# Patient Record
Sex: Female | Born: 1935
Health system: Southern US, Community
[De-identification: ages and names within clinical notes are randomized; demographics above are authoritative.]

## PROBLEM LIST (undated history)

## (undated) DIAGNOSIS — K56609 Unspecified intestinal obstruction, unspecified as to partial versus complete obstruction: Secondary | ICD-10-CM

## (undated) DIAGNOSIS — N6459 Other signs and symptoms in breast: Secondary | ICD-10-CM

## (undated) DIAGNOSIS — M1990 Unspecified osteoarthritis, unspecified site: Secondary | ICD-10-CM

## (undated) DIAGNOSIS — Z9289 Personal history of other medical treatment: Secondary | ICD-10-CM

## (undated) DIAGNOSIS — M8580 Other specified disorders of bone density and structure, unspecified site: Secondary | ICD-10-CM

## (undated) DIAGNOSIS — Z8719 Personal history of other diseases of the digestive system: Secondary | ICD-10-CM

## (undated) DIAGNOSIS — H409 Unspecified glaucoma: Secondary | ICD-10-CM

## (undated) DIAGNOSIS — E119 Type 2 diabetes mellitus without complications: Secondary | ICD-10-CM

## (undated) DIAGNOSIS — H209 Unspecified iridocyclitis: Secondary | ICD-10-CM

## (undated) DIAGNOSIS — D649 Anemia, unspecified: Secondary | ICD-10-CM

## (undated) HISTORY — PX: APPENDECTOMY: SHX54

## (undated) HISTORY — PX: CATARACT EXTRACTION: SUR2

## (undated) HISTORY — DX: Other specified disorders of bone density and structure, unspecified site: M85.80

## (undated) HISTORY — DX: Unspecified osteoarthritis, unspecified site: M19.90

## (undated) HISTORY — DX: Unspecified iridocyclitis: H20.9

## (undated) HISTORY — DX: Other signs and symptoms in breast: N64.59

## (undated) HISTORY — PX: BACK SURGERY: SHX140

## (undated) HISTORY — DX: Unspecified glaucoma: H40.9

## (undated) HISTORY — DX: Personal history of other diseases of the digestive system: Z87.19

## (undated) HISTORY — DX: Unspecified intestinal obstruction, unspecified as to partial versus complete obstruction: K56.609

## (undated) HISTORY — PX: TOTAL ABDOMINAL HYSTERECTOMY: SHX209

## (undated) HISTORY — PX: DILATION AND CURETTAGE OF UTERUS: SHX78

---

## 2001-02-23 ENCOUNTER — Emergency Department (HOSPITAL_COMMUNITY): Admission: EM | Admit: 2001-02-23 | Discharge: 2001-02-23 | Payer: Self-pay | Admitting: *Deleted

## 2001-02-23 ENCOUNTER — Encounter: Payer: Self-pay | Admitting: *Deleted

## 2001-09-24 ENCOUNTER — Ambulatory Visit (HOSPITAL_COMMUNITY): Admission: RE | Admit: 2001-09-24 | Discharge: 2001-09-24 | Payer: Self-pay | Admitting: Specialist

## 2001-09-24 ENCOUNTER — Encounter: Payer: Self-pay | Admitting: Family Medicine

## 2001-10-04 ENCOUNTER — Ambulatory Visit (HOSPITAL_COMMUNITY): Admission: RE | Admit: 2001-10-04 | Discharge: 2001-10-04 | Payer: Self-pay | Admitting: Specialist

## 2001-10-04 ENCOUNTER — Encounter: Payer: Self-pay | Admitting: Family Medicine

## 2002-11-03 DIAGNOSIS — N6459 Other signs and symptoms in breast: Secondary | ICD-10-CM

## 2002-11-03 HISTORY — DX: Other signs and symptoms in breast: N64.59

## 2002-12-23 ENCOUNTER — Other Ambulatory Visit: Admission: RE | Admit: 2002-12-23 | Discharge: 2002-12-23 | Payer: Self-pay | Admitting: Internal Medicine

## 2003-01-04 ENCOUNTER — Encounter: Admission: RE | Admit: 2003-01-04 | Discharge: 2003-01-04 | Payer: Self-pay | Admitting: Internal Medicine

## 2003-01-04 ENCOUNTER — Encounter: Payer: Self-pay | Admitting: Internal Medicine

## 2003-03-18 ENCOUNTER — Encounter: Payer: Self-pay | Admitting: Internal Medicine

## 2003-03-18 ENCOUNTER — Inpatient Hospital Stay (HOSPITAL_COMMUNITY): Admission: EM | Admit: 2003-03-18 | Discharge: 2003-03-22 | Payer: Self-pay

## 2003-03-19 ENCOUNTER — Encounter: Payer: Self-pay | Admitting: Internal Medicine

## 2003-03-20 ENCOUNTER — Encounter (HOSPITAL_BASED_OUTPATIENT_CLINIC_OR_DEPARTMENT_OTHER): Payer: Self-pay | Admitting: General Surgery

## 2003-04-24 LAB — HM COLONOSCOPY: HM Colonoscopy: NORMAL

## 2003-10-28 ENCOUNTER — Emergency Department (HOSPITAL_COMMUNITY): Admission: EM | Admit: 2003-10-28 | Discharge: 2003-10-29 | Payer: Self-pay | Admitting: Emergency Medicine

## 2003-11-04 DIAGNOSIS — H209 Unspecified iridocyclitis: Secondary | ICD-10-CM

## 2003-11-04 HISTORY — DX: Unspecified iridocyclitis: H20.9

## 2004-02-23 ENCOUNTER — Ambulatory Visit (HOSPITAL_COMMUNITY): Admission: RE | Admit: 2004-02-23 | Discharge: 2004-02-23 | Payer: Self-pay | Admitting: Internal Medicine

## 2004-09-27 ENCOUNTER — Ambulatory Visit: Payer: Self-pay | Admitting: Internal Medicine

## 2004-10-18 ENCOUNTER — Ambulatory Visit: Payer: Self-pay | Admitting: Internal Medicine

## 2004-10-29 ENCOUNTER — Encounter: Admission: RE | Admit: 2004-10-29 | Discharge: 2004-10-29 | Payer: Self-pay | Admitting: Internal Medicine

## 2004-11-18 ENCOUNTER — Encounter: Admission: RE | Admit: 2004-11-18 | Discharge: 2005-02-16 | Payer: Self-pay | Admitting: Internal Medicine

## 2004-12-04 ENCOUNTER — Ambulatory Visit: Payer: Self-pay | Admitting: Internal Medicine

## 2005-02-17 ENCOUNTER — Ambulatory Visit: Payer: Self-pay | Admitting: Internal Medicine

## 2005-05-20 ENCOUNTER — Ambulatory Visit: Payer: Self-pay | Admitting: Family Medicine

## 2005-06-16 ENCOUNTER — Ambulatory Visit: Payer: Self-pay | Admitting: Internal Medicine

## 2005-07-21 ENCOUNTER — Ambulatory Visit (HOSPITAL_COMMUNITY): Admission: RE | Admit: 2005-07-21 | Discharge: 2005-07-21 | Payer: Self-pay | Admitting: Family Medicine

## 2005-10-13 ENCOUNTER — Ambulatory Visit: Payer: Self-pay | Admitting: Internal Medicine

## 2006-01-07 ENCOUNTER — Ambulatory Visit: Payer: Self-pay | Admitting: Internal Medicine

## 2006-06-13 ENCOUNTER — Ambulatory Visit: Payer: Self-pay | Admitting: Internal Medicine

## 2006-08-17 ENCOUNTER — Ambulatory Visit: Payer: Self-pay | Admitting: Internal Medicine

## 2006-09-28 ENCOUNTER — Encounter: Admission: RE | Admit: 2006-09-28 | Discharge: 2006-09-28 | Payer: Self-pay | Admitting: Internal Medicine

## 2006-12-07 ENCOUNTER — Ambulatory Visit: Payer: Self-pay | Admitting: Family Medicine

## 2007-01-18 ENCOUNTER — Ambulatory Visit: Payer: Self-pay | Admitting: Internal Medicine

## 2007-02-11 DIAGNOSIS — M8580 Other specified disorders of bone density and structure, unspecified site: Secondary | ICD-10-CM

## 2007-02-11 DIAGNOSIS — K219 Gastro-esophageal reflux disease without esophagitis: Secondary | ICD-10-CM

## 2007-02-11 DIAGNOSIS — M810 Age-related osteoporosis without current pathological fracture: Secondary | ICD-10-CM | POA: Insufficient documentation

## 2007-07-19 ENCOUNTER — Telehealth (INDEPENDENT_AMBULATORY_CARE_PROVIDER_SITE_OTHER): Payer: Self-pay | Admitting: *Deleted

## 2007-07-20 ENCOUNTER — Ambulatory Visit: Payer: Self-pay | Admitting: Internal Medicine

## 2007-07-20 DIAGNOSIS — H209 Unspecified iridocyclitis: Secondary | ICD-10-CM

## 2007-07-21 ENCOUNTER — Ambulatory Visit: Payer: Self-pay | Admitting: Internal Medicine

## 2007-12-01 ENCOUNTER — Ambulatory Visit: Payer: Self-pay | Admitting: Internal Medicine

## 2007-12-01 DIAGNOSIS — E119 Type 2 diabetes mellitus without complications: Secondary | ICD-10-CM

## 2007-12-01 DIAGNOSIS — E1140 Type 2 diabetes mellitus with diabetic neuropathy, unspecified: Secondary | ICD-10-CM | POA: Insufficient documentation

## 2007-12-06 LAB — CONVERTED CEMR LAB
AST: 18 units/L (ref 0–37)
Basophils Relative: 0.5 % (ref 0.0–1.0)
CO2: 31 meq/L (ref 19–32)
Eosinophils Relative: 1.7 % (ref 0.0–5.0)
GFR calc Af Amer: 90 mL/min
Glucose, Bld: 113 mg/dL — ABNORMAL HIGH (ref 70–99)
HCT: 38 % (ref 36.0–46.0)
HDL: 44.9 mg/dL (ref >39.0)
Hemoglobin: 12.1 g/dL (ref 12.0–15.0)
Lymphocytes Relative: 50.1 % — ABNORMAL HIGH (ref 12.0–46.0)
Monocytes Absolute: 0.5 10*3/uL (ref 0.2–0.7)
Neutro Abs: 2.9 10*3/uL (ref 1.4–7.7)
Neutrophils Relative %: 41 % — ABNORMAL LOW (ref 43.0–77.0)
Potassium: 4 meq/L (ref 3.5–5.1)
TSH: 3.09 microintl units/mL (ref 0.35–5.50)
VLDL: 12 mg/dL (ref 0–40)
WBC: 7.1 10*3/uL (ref 4.5–10.5)

## 2008-01-21 ENCOUNTER — Encounter: Admission: RE | Admit: 2008-01-21 | Discharge: 2008-01-21 | Payer: Self-pay | Admitting: Internal Medicine

## 2008-02-07 ENCOUNTER — Encounter (INDEPENDENT_AMBULATORY_CARE_PROVIDER_SITE_OTHER): Payer: Self-pay | Admitting: *Deleted

## 2008-02-12 ENCOUNTER — Encounter: Payer: Self-pay | Admitting: Internal Medicine

## 2008-02-17 ENCOUNTER — Telehealth (INDEPENDENT_AMBULATORY_CARE_PROVIDER_SITE_OTHER): Payer: Self-pay | Admitting: *Deleted

## 2008-02-22 ENCOUNTER — Ambulatory Visit: Payer: Self-pay | Admitting: Internal Medicine

## 2008-03-18 ENCOUNTER — Emergency Department (HOSPITAL_COMMUNITY): Admission: EM | Admit: 2008-03-18 | Discharge: 2008-03-19 | Payer: Self-pay | Admitting: Emergency Medicine

## 2008-05-11 ENCOUNTER — Inpatient Hospital Stay (HOSPITAL_COMMUNITY): Admission: EM | Admit: 2008-05-11 | Discharge: 2008-05-17 | Payer: Self-pay | Admitting: Emergency Medicine

## 2008-05-11 ENCOUNTER — Ambulatory Visit: Payer: Self-pay | Admitting: Internal Medicine

## 2008-05-26 ENCOUNTER — Ambulatory Visit: Payer: Self-pay | Admitting: Internal Medicine

## 2008-05-26 ENCOUNTER — Ambulatory Visit (HOSPITAL_COMMUNITY): Admission: RE | Admit: 2008-05-26 | Discharge: 2008-05-26 | Payer: Self-pay | Admitting: Internal Medicine

## 2008-05-29 ENCOUNTER — Telehealth (INDEPENDENT_AMBULATORY_CARE_PROVIDER_SITE_OTHER): Payer: Self-pay | Admitting: *Deleted

## 2008-06-02 ENCOUNTER — Ambulatory Visit: Payer: Self-pay | Admitting: Internal Medicine

## 2008-06-13 LAB — CONVERTED CEMR LAB
Hgb A1c MFr Bld: 7 % — ABNORMAL HIGH (ref 4.6–6.1)
Microalb, Ur: 0.77 mg/dL (ref 0.00–1.89)

## 2008-06-28 ENCOUNTER — Ambulatory Visit: Payer: Self-pay | Admitting: Internal Medicine

## 2008-06-29 ENCOUNTER — Encounter: Payer: Self-pay | Admitting: Internal Medicine

## 2008-07-11 ENCOUNTER — Encounter: Payer: Self-pay | Admitting: Internal Medicine

## 2008-08-07 ENCOUNTER — Ambulatory Visit: Payer: Self-pay | Admitting: Internal Medicine

## 2008-12-21 ENCOUNTER — Ambulatory Visit: Payer: Self-pay | Admitting: Internal Medicine

## 2008-12-21 DIAGNOSIS — M1990 Unspecified osteoarthritis, unspecified site: Secondary | ICD-10-CM | POA: Insufficient documentation

## 2009-01-03 ENCOUNTER — Ambulatory Visit: Payer: Self-pay | Admitting: Internal Medicine

## 2009-01-03 DIAGNOSIS — R0989 Other specified symptoms and signs involving the circulatory and respiratory systems: Secondary | ICD-10-CM

## 2009-01-03 LAB — CONVERTED CEMR LAB: Vit D, 25-Hydroxy: 27 ng/mL — ABNORMAL LOW (ref 30–89)

## 2009-01-05 ENCOUNTER — Telehealth (INDEPENDENT_AMBULATORY_CARE_PROVIDER_SITE_OTHER): Payer: Self-pay | Admitting: *Deleted

## 2009-01-05 LAB — CONVERTED CEMR LAB
BUN: 18 mg/dL (ref 6–23)
CO2: 30 meq/L (ref 19–32)
Calcium: 9.2 mg/dL (ref 8.4–10.5)
Cholesterol: 197 mg/dL (ref 0–200)
Creatinine, Ser: 0.8 mg/dL (ref 0.4–1.2)
Glucose, Bld: 112 mg/dL — ABNORMAL HIGH (ref 70–99)
HDL: 43.7 mg/dL (ref >39.0)
Hemoglobin: 12.1 g/dL (ref 12.0–15.0)
Microalb, Ur: 0.4 mg/dL (ref 0.0–1.9)
Sodium: 143 meq/L (ref 135–145)
Triglycerides: 60 mg/dL (ref 0–149)
VLDL: 12 mg/dL (ref 0–40)

## 2009-01-12 ENCOUNTER — Ambulatory Visit: Payer: Self-pay

## 2009-01-12 ENCOUNTER — Encounter: Payer: Self-pay | Admitting: Internal Medicine

## 2009-01-16 ENCOUNTER — Emergency Department (HOSPITAL_COMMUNITY): Admission: EM | Admit: 2009-01-16 | Discharge: 2009-01-16 | Payer: Self-pay | Admitting: Emergency Medicine

## 2009-01-19 ENCOUNTER — Ambulatory Visit: Payer: Self-pay | Admitting: Internal Medicine

## 2009-01-19 LAB — CONVERTED CEMR LAB
Bacteria, UA: NONE SEEN
Glucose, Urine, Semiquant: NEGATIVE
Ketones, urine, test strip: NEGATIVE
Nitrite: NEGATIVE
WBC Urine, dipstick: NEGATIVE
WBC, UA: NONE SEEN cells/hpf (ref <3)
pH: 6

## 2009-01-20 ENCOUNTER — Encounter: Payer: Self-pay | Admitting: Internal Medicine

## 2009-01-22 ENCOUNTER — Telehealth: Payer: Self-pay | Admitting: Internal Medicine

## 2009-01-23 ENCOUNTER — Encounter: Admission: RE | Admit: 2009-01-23 | Discharge: 2009-01-23 | Payer: Self-pay | Admitting: Internal Medicine

## 2009-02-20 ENCOUNTER — Telehealth (INDEPENDENT_AMBULATORY_CARE_PROVIDER_SITE_OTHER): Payer: Self-pay | Admitting: *Deleted

## 2009-02-23 ENCOUNTER — Encounter (INDEPENDENT_AMBULATORY_CARE_PROVIDER_SITE_OTHER): Payer: Self-pay | Admitting: *Deleted

## 2009-04-09 ENCOUNTER — Ambulatory Visit: Payer: Self-pay | Admitting: Internal Medicine

## 2009-04-09 DIAGNOSIS — E785 Hyperlipidemia, unspecified: Secondary | ICD-10-CM

## 2009-05-10 ENCOUNTER — Telehealth (INDEPENDENT_AMBULATORY_CARE_PROVIDER_SITE_OTHER): Payer: Self-pay | Admitting: *Deleted

## 2009-08-09 ENCOUNTER — Encounter: Payer: Self-pay | Admitting: Internal Medicine

## 2009-08-13 ENCOUNTER — Ambulatory Visit: Payer: Self-pay | Admitting: Internal Medicine

## 2009-08-14 ENCOUNTER — Encounter: Payer: Self-pay | Admitting: Internal Medicine

## 2009-08-15 ENCOUNTER — Telehealth: Payer: Self-pay | Admitting: Internal Medicine

## 2009-08-16 ENCOUNTER — Encounter: Payer: Self-pay | Admitting: Internal Medicine

## 2009-08-24 ENCOUNTER — Encounter: Admission: RE | Admit: 2009-08-24 | Discharge: 2009-08-24 | Payer: Self-pay | Admitting: Orthopedic Surgery

## 2009-12-18 ENCOUNTER — Telehealth: Payer: Self-pay | Admitting: Internal Medicine

## 2010-01-21 ENCOUNTER — Ambulatory Visit: Payer: Self-pay | Admitting: Internal Medicine

## 2010-01-21 DIAGNOSIS — R109 Unspecified abdominal pain: Secondary | ICD-10-CM

## 2010-01-24 LAB — CONVERTED CEMR LAB
Basophils Relative: 2.6 % (ref 0.0–3.0)
CO2: 29 meq/L (ref 19–32)
Calcium: 9.2 mg/dL (ref 8.4–10.5)
Creatinine, Ser: 0.7 mg/dL (ref 0.4–1.2)
Eosinophils Absolute: 0.1 10*3/uL (ref 0.0–0.7)
Glucose, Bld: 88 mg/dL (ref 70–99)
Hemoglobin: 12.1 g/dL (ref 12.0–15.0)
Lymphs Abs: 3.7 10*3/uL (ref 0.7–4.0)
MCHC: 31.4 g/dL (ref 30.0–36.0)
MCV: 85.8 fL (ref 78.0–100.0)
Monocytes Absolute: 0.6 10*3/uL (ref 0.1–1.0)
Neutro Abs: 1.3 10*3/uL — ABNORMAL LOW (ref 1.4–7.7)
RBC: 4.5 M/uL (ref 3.87–5.11)

## 2010-04-25 ENCOUNTER — Ambulatory Visit: Payer: Self-pay | Admitting: Internal Medicine

## 2010-04-30 ENCOUNTER — Encounter: Payer: Self-pay | Admitting: Internal Medicine

## 2010-04-30 LAB — CONVERTED CEMR LAB
AST: 18 units/L (ref 0–37)
Hgb A1c MFr Bld: 6.7 % — ABNORMAL HIGH (ref 4.6–6.5)
TSH: 2.03 microintl units/mL (ref 0.35–5.50)
Total CHOL/HDL Ratio: 4

## 2010-05-02 ENCOUNTER — Encounter: Payer: Self-pay | Admitting: Internal Medicine

## 2010-05-02 ENCOUNTER — Encounter: Admission: RE | Admit: 2010-05-02 | Discharge: 2010-05-02 | Payer: Self-pay | Admitting: Internal Medicine

## 2010-05-13 ENCOUNTER — Encounter: Payer: Self-pay | Admitting: Internal Medicine

## 2010-05-14 ENCOUNTER — Encounter: Admission: RE | Admit: 2010-05-14 | Discharge: 2010-05-14 | Payer: Self-pay | Admitting: Internal Medicine

## 2010-05-17 ENCOUNTER — Ambulatory Visit: Payer: Self-pay | Admitting: Internal Medicine

## 2010-05-17 DIAGNOSIS — L821 Other seborrheic keratosis: Secondary | ICD-10-CM

## 2010-05-22 ENCOUNTER — Ambulatory Visit: Payer: Self-pay | Admitting: Internal Medicine

## 2010-05-24 ENCOUNTER — Ambulatory Visit: Payer: Self-pay | Admitting: Family Medicine

## 2010-06-05 ENCOUNTER — Ambulatory Visit: Payer: Self-pay | Admitting: Internal Medicine

## 2010-06-05 ENCOUNTER — Telehealth: Payer: Self-pay | Admitting: Internal Medicine

## 2010-06-12 LAB — CONVERTED CEMR LAB
ALT: 18 units/L (ref 0–35)
HDL: 44.9 mg/dL (ref >39.00)
Total CHOL/HDL Ratio: 3

## 2010-06-18 ENCOUNTER — Encounter (INDEPENDENT_AMBULATORY_CARE_PROVIDER_SITE_OTHER): Payer: Self-pay | Admitting: *Deleted

## 2010-07-22 ENCOUNTER — Telehealth (INDEPENDENT_AMBULATORY_CARE_PROVIDER_SITE_OTHER): Payer: Self-pay | Admitting: *Deleted

## 2010-08-14 ENCOUNTER — Encounter: Payer: Self-pay | Admitting: Internal Medicine

## 2010-08-26 ENCOUNTER — Encounter: Payer: Self-pay | Admitting: Internal Medicine

## 2010-08-26 ENCOUNTER — Ambulatory Visit: Payer: Self-pay | Admitting: Internal Medicine

## 2010-08-26 DIAGNOSIS — H9319 Tinnitus, unspecified ear: Secondary | ICD-10-CM

## 2010-08-26 LAB — CONVERTED CEMR LAB
Basophils Absolute: 0 10*3/uL (ref 0.0–0.1)
Calcium: 9.1 mg/dL (ref 8.4–10.5)
GFR calc non Af Amer: 102.39 mL/min (ref >60)
Glucose, Bld: 88 mg/dL (ref 70–99)
Hemoglobin: 11.6 g/dL — ABNORMAL LOW (ref 12.0–15.0)
Hgb A1c MFr Bld: 6.7 % — ABNORMAL HIGH (ref 4.6–6.5)
Lymphocytes Relative: 49.3 % — ABNORMAL HIGH (ref 12.0–46.0)
Monocytes Relative: 6.5 % (ref 3.0–12.0)
Neutro Abs: 2.9 10*3/uL (ref 1.4–7.7)
Neutrophils Relative %: 42.1 % — ABNORMAL LOW (ref 43.0–77.0)
RDW: 13.8 % (ref 11.5–14.6)
Sodium: 142 meq/L (ref 135–145)

## 2010-08-31 ENCOUNTER — Telehealth (INDEPENDENT_AMBULATORY_CARE_PROVIDER_SITE_OTHER): Payer: Self-pay | Admitting: *Deleted

## 2010-09-03 ENCOUNTER — Encounter (INDEPENDENT_AMBULATORY_CARE_PROVIDER_SITE_OTHER): Payer: Self-pay | Admitting: *Deleted

## 2010-09-12 ENCOUNTER — Ambulatory Visit: Payer: Self-pay | Admitting: Cardiology

## 2010-09-12 ENCOUNTER — Ambulatory Visit: Payer: Self-pay

## 2010-09-12 ENCOUNTER — Ambulatory Visit (HOSPITAL_COMMUNITY): Admission: RE | Admit: 2010-09-12 | Discharge: 2010-09-12 | Payer: Self-pay | Admitting: Internal Medicine

## 2010-09-12 ENCOUNTER — Encounter: Payer: Self-pay | Admitting: Internal Medicine

## 2010-09-13 ENCOUNTER — Ambulatory Visit: Payer: Self-pay | Admitting: Internal Medicine

## 2010-09-18 ENCOUNTER — Encounter: Payer: Self-pay | Admitting: Internal Medicine

## 2010-09-18 ENCOUNTER — Telehealth: Payer: Self-pay | Admitting: Internal Medicine

## 2010-09-18 LAB — CONVERTED CEMR LAB
Ferritin: 64.5 ng/mL (ref 10.0–291.0)
Folate: 8.6 ng/mL
Iron: 64 ug/dL (ref 42–145)
Vitamin B-12: 519 pg/mL (ref 211–911)

## 2010-10-03 HISTORY — PX: TOTAL KNEE ARTHROPLASTY: SHX125

## 2010-10-11 ENCOUNTER — Inpatient Hospital Stay (HOSPITAL_COMMUNITY)
Admission: RE | Admit: 2010-10-11 | Discharge: 2010-10-14 | Payer: Self-pay | Source: Home / Self Care | Attending: Orthopedic Surgery | Admitting: Orthopedic Surgery

## 2010-10-12 LAB — CONVERTED CEMR LAB: Potassium, serum: 3.9 mmol/L

## 2010-10-14 LAB — CONVERTED CEMR LAB
Hemoglobin: 9.2 g/dL
Platelets: 128 10*3/uL

## 2010-11-13 ENCOUNTER — Ambulatory Visit
Admission: RE | Admit: 2010-11-13 | Discharge: 2010-11-13 | Payer: Self-pay | Source: Home / Self Care | Attending: Internal Medicine | Admitting: Internal Medicine

## 2010-11-13 DIAGNOSIS — D649 Anemia, unspecified: Secondary | ICD-10-CM | POA: Insufficient documentation

## 2010-11-13 LAB — CONVERTED CEMR LAB: Hemoglobin: 9.6 g/dL

## 2010-12-01 LAB — CONVERTED CEMR LAB
Bilirubin Urine: NEGATIVE
CO2: 29 meq/L (ref 19–32)
Calcium: 9.4 mg/dL (ref 8.4–10.5)
Chloride: 102 meq/L (ref 96–112)
Glucose, Bld: 154 mg/dL — ABNORMAL HIGH (ref 70–99)
Glucose, Urine, Semiquant: NEGATIVE
Hemoglobin: 12.3 g/dL (ref 12.0–15.0)
Lymphocytes Relative: 43.8 % (ref 12.0–46.0)
Monocytes Relative: 7.4 % (ref 3.0–12.0)
Neutro Abs: 3.2 10*3/uL (ref 1.4–7.7)
Neutrophils Relative %: 46.4 % (ref 43.0–77.0)
RBC: 4.53 M/uL (ref 3.87–5.11)
RDW: 13.2 % (ref 11.5–14.6)
Sodium: 139 meq/L (ref 135–145)
Specific Gravity, Urine: 1.015
Urobilinogen, UA: 0.2
WBC Urine, dipstick: NEGATIVE
pH: 5

## 2010-12-03 NOTE — Progress Notes (Signed)
Summary: HYDROCODONE REFILL  Phone Note Refill Request Message from:  Patient on July 22, 2010 10:00 AM  Refills Requested: Medication #1:  VICODIN 5-500 MG TABS one every 4 hours as needed for pain.Marland Kitchen PATIENT NUMBER = [phone]     PLEASE CALL WHEN READY FOR PICKUP  Initial call taken by: Jerolyn Shin,  July 22, 2010 10:01 AM  Follow-up for Phone Call        Please advise.  Follow-up by: Lucious Groves CMA,  July 22, 2010 10:05 AM  Additional Follow-up for Phone Call Additional follow up Details #1::        ok  #40 , no refills Additional Follow-up by: [facility] E. Paz MD,  July 22, 2010 3:53 PM    Additional Follow-up for Phone Call Additional follow up Details #2::    Pt is aware rx is ready for pick up. Army Fossa CMA  July 22, 2010 4:21 PM   Prescriptions: VICODIN 5-500 MG TABS (HYDROCODONE-ACETAMINOPHEN) one every 4 hours as needed for pain.  #40 x 0   Entered by:   Lucious Groves CMA   Authorized by:   Nolon Rod. Paz MD   Signed by:   Lucious Groves CMA on 07/22/2010   Method used:   Print then Give to Patient   RxID:   [id number]

## 2010-12-03 NOTE — Letter (Signed)
Summary: Unable To Reach-Consult Scheduled  [facility] [place]  [address] [place], [place] [address]   Phone: [phone]  Fax: [phone]    06/18/2010 MRN: [id number]    Dear Ms. Minteer,   We have been unable to reach you by phone.  Please contact our office with an updated phone number.     Thank you,  Army Fossa CMA  June 18, 2010 8:58 AM

## 2010-12-03 NOTE — Assessment & Plan Note (Signed)
Summary: clearance for surgery//kn   Vital Signs:  Patient profile:   77 year old female Height:      65 inches (165.10 cm) Weight:      205.50 pounds (93.41 kg) BMI:     34.32 O2 Sat:      97 % on Room air Temp:     98.3 degrees F (36.83 degrees C) oral Pulse rate:   86 / minute BP sitting:   124 / 66  (left arm) Cuff size:   regular  Vitals Entered By: Lucious Groves CMA (August 26, 2010 12:23 PM)  O2 Flow:  Room air CC: Discuss surgical clearance./kb Is Patient Diabetic? Yes Comments Patient notes that she was not given her pain med by our office so she had to get an alternate from Ortho. Patient also notes that she has had ringing in the Left ear x4 months./kb   History of Present Illness:  to have a right total knee replacement soon with Dr. Madelon Lips Needs surgical clearance Doing well She was prescribed Vicodin here, that helps better than Ultram prescribed by orthopedic surgery.  Still has left-sided tinnitus for several months, no decreased hearing.   Current Medications (verified): 1)  Metformin Hcl 500 Mg Tabs (Metformin Hcl) .... Tak One Tablet Two Times A Day 2)  Aspirin 81 Mg Tbec (Aspirin) .... One Tablet Daily 3)  Simvastatin 20 Mg Tabs (Simvastatin) .Marland Kitchen.. 1 By Mouth At Bedtime. 4)  Centrum Silver Ultra Womens  Tabs (Multiple Vitamins-Minerals) .... Take 1 Tablet By Mouth Once A Day 5)  Vitamin B-12 50 Mcg Tabs (Cyanocobalamin) .... Take 1 Tablet By Mouth Once A Day 6)  Calcium-Magnesium-Vitamin D 200-100-33.3 Mg-Mg-Unit Caps (Calcium-Magnesium-Vitamin D) .... Take 1 Capsule By Mouth Once A Day 7)  Vicodin 5-500 Mg Tabs (Hydrocodone-Acetaminophen) .... One Every 4 Hours As Needed For Pain. 8)  **pain Med--Name Unknown .... As Directed As Needed  Allergies (verified): No Known Drug Allergies  Past History:  Past Medical History: Reviewed history from 04/25/2010 and no changes required. AODM GERD Osteopenia abnormal breast exam 2004 - [facility] NEG (eval by  surgeon neg) h/o SBO 2004, and 7-09 h/o iritis aprox 2005 Osteoarthritis Hyperlipidemia normal carotid ultrasound 01/2009  Past Surgical History: Reviewed history from 01/03/2009 and no changes required. Hysterectomy in the 70s, for bleeding, no cancer  Oophorectomy Bilat per pt  Social History: Reviewed history from 04/25/2010 and no changes required. lives by herself, her family is in [place], lost   2 sisters  widow 2 kids (they live in [place]) has her own bussiness, makes aprons tobacco-- never  ETOH--never   Review of Systems CV:  denies chest pain or shortness of breath no orthopnea, no palpitations She is very active, able to walk 2 blocks and then stop  due to fatigue. GI:  no nausea, vomiting, diarrhea.  Physical Exam  General:  alert and well-developed.   Neck:  no JVD at 45 Lungs:  normal respiratory effort, no intercostal retractions, no accessory muscle use, and normal breath sounds.   Heart:  normal rate, regular rhythm, no murmur, and no gallop.   Abdomen:  soft, non-tender, no distention, no masses, no guarding, and no rigidity.   Extremities:  no lower extremity edema Psych:  not anxious appearing and not depressed appearing.     Impression & Recommendations:  Problem # 1:  OSTEOARTHRITIS (ICD-25.62) 77 year old lady with history of diabetes,high cholesterol, nonsmoker who is in need of a total knee replacement She is essentially asymptomatic EKG today  showed NSR  Chart reviewed, normal carotid ultrasound in 2010  she needs surgical clearance, case informally discussed with cardiology. Will order a echocardiogram, if LV function normal then she is clear otherwise she may needs a stress test  Her updated medication list for this problem includes:    Aspirin 81 Mg Tbec (Aspirin) ..... One tablet daily    Vicodin 5-500 Mg Tabs (Hydrocodone-acetaminophen) ..... One every 4 hours as needed for pain.  Orders: Venipuncture (62694) TLB-CBC Platelet -  w/Differential (85025-CBCD) EKG w/ Interpretation (93000) Specimen Handling (85462) Cardiology Referral (Cardiology)  Problem # 2:  AODM (ICD-250.00) due for labs Her updated medication list for this problem includes:    Metformin Hcl 500 Mg Tabs (Metformin hcl) .Marland Kitchen... Tak one tablet two times a day    Aspirin 81 Mg Tbec (Aspirin) ..... One tablet daily    Labs Reviewed: Creat: 0.7 (01/21/2010)     Last Eye Exam: normal (08/14/2009) Reviewed HgBA1c results: 6.7 (04/25/2010)  7.2 (01/21/2010)  Orders: TLB-A1C / Hgb A1C (Glycohemoglobin) (83036-A1C) TLB-BMP (Basic Metabolic Panel-BMET) (80048-METABOL)  Problem # 3:  TINNITUS (ICD-388.30) reassess after surgery  Problem # 4:  time spent coordination the patient care more than 35 minutes  Complete Medication List: 1)  Metformin Hcl 500 Mg Tabs (Metformin hcl) .... Tak one tablet two times a day 2)  Aspirin 81 Mg Tbec (Aspirin) .... One tablet daily 3)  Simvastatin 20 Mg Tabs (Simvastatin) .Marland Kitchen.. 1 by mouth at bedtime. 4)  Centrum Silver Ultra Womens Tabs (Multiple vitamins-minerals) .... Take 1 tablet by mouth once a day 5)  Vitamin B-12 50 Mcg Tabs (Cyanocobalamin) .... Take 1 tablet by mouth once a day 6)  Calcium-magnesium-vitamin D 200-100-33.3 Mg-mg-unit Caps (Calcium-magnesium-vitamin d) .... Take 1 capsule by mouth once a day 7)  Vicodin 5-500 Mg Tabs (Hydrocodone-acetaminophen) .... One every 4 hours as needed for pain. 8)  **pain Med--name Unknown  .... As directed as needed  Other Orders: Flu Vaccine 60yrs + MEDICARE PATIENTS (V0350) Administration Flu vaccine - MCR (K9381)  Patient Instructions: 1)  I'll send a letter to your orthopedic surgeon Prescriptions: SIMVASTATIN 20 MG TABS (SIMVASTATIN) 1 by mouth at bedtime.  #90 x 3   Entered and Authorized by:   Nolon Rod. Paz MD   Signed by:   Nolon Rod. Paz MD on 08/26/2010   Method used:   Print then Give to Patient   RxID:   [id number] METFORMIN HCL 500 MG TABS  (METFORMIN HCL) tak one tablet two times a day  #180 x 3   Entered and Authorized by:   Nolon Rod. Paz MD   Signed by:   Nolon Rod. Paz MD on 08/26/2010   Method used:   Print then Give to Patient   RxID:   [id number] VICODIN 5-500 MG TABS (HYDROCODONE-ACETAMINOPHEN) one every 4 hours as needed for pain.  #60 x 0   Entered and Authorized by:   Nolon Rod. Paz MD   Signed by:   Nolon Rod. Paz MD on 08/26/2010   Method used:   Print then Give to Patient   RxID:   [id number]    Orders Added: 1)  Venipuncture [36415] 2)  TLB-A1C / Hgb A1C (Glycohemoglobin) [83036-A1C] 3)  TLB-BMP (Basic Metabolic Panel-BMET) [80048-METABOL] 4)  TLB-CBC Platelet - w/Differential [85025-CBCD] 5)  EKG w/ Interpretation [93000] 6)  Specimen Handling [99000] 7)  Flu Vaccine 26yrs + MEDICARE PATIENTS [Q2039] 8)  Administration Flu vaccine - MCR [G0008] 9)  Cardiology Referral [Cardiology]  10)  Est. Patient Level IV [[address]] Flu Vaccine Consent Questions     Do you have a history of severe allergic reactions to this vaccine? no    Any prior history of allergic reactions to egg and/or gelatin? no    Do you have a sensitivity to the preservative Thimersol? no    Do you have a past history of Guillan-Barre Syndrome? no    Do you currently have an acute febrile illness? no    Have you ever had a severe reaction to latex? no    Vaccine information given and explained to patient? yes    Are you currently pregnant? no    Lot Number:[id number]   Exp Date:05/03/2011   Site Given  leftt Deltoid IMcine 20yrs + MEDICARE PATIENTS [Q2039] 8)  Administration Flu vaccine - MCR [G0008]     .lbmedflu

## 2010-12-03 NOTE — Assessment & Plan Note (Signed)
Summary: remove mole/cbs--Rm 11   Vital Signs:  Patient profile:   77 year old female Height:      65 inches Weight:      207.25 pounds BMI:     34.61 Temp:     97.2 degrees F oral Pulse rate:   72 / minute Pulse rhythm:   regular Resp:     16 per minute BP sitting:   130 / 70  (left arm) Cuff size:   large  Vitals Entered By: Mervin Kung CMA Duncan Dull) (May 17, 2010 12:44 PM) CC: Room 11   Pt here for mole removal. Is Patient Diabetic? No Comments Pt has completed Amoxicillin.  Nicki Gu[place]ara Fergerson CMA Duncan Dull)  May 17, 2010 12:47 PM    History of Present Illness: has a lesion in the right lower quadrant of the abdomen, is causing some discomfort, has increased in size lately  Allergies (verified): No Known Drug Allergies   Impression & Recommendations:  Problem # 1:  procedure note in a sterile fashion and under anesthesia with 2 cc of 1% lidocaine without we did an elliptical incision about 1.3 cm in size and removed a aprox 1cm skin lesion, dark, hard. Likely seborrheic keratosis 3 stitches Pathology sent Patient will call if redness, discharge We'll keep the area clean and dry and put an antibiotic ointment on it Come back in 5 days to check the sutures  Complete Medication List: 1)  Metformin Hcl 500 Mg Tabs (Metformin hcl) .... Tak one tablet two times a day 2)  Amoxicillin 500 Mg Caps (Amoxicillin) .... 2 by mouth twice a day for one week 3)  Aspirin 81 Mg Tbec (Aspirin) .... One tablet daily 4)  Simvastatin 20 Mg Tabs (Simvastatin) .Marland Kitchen.. 1 by mouth at bedtime. 5)  Centrum Silver Ultra Womens Tabs (Multiple vitamins-minerals) .... Take 1 tablet by mouth once a day 6)  Vitamin B-12 50 Mcg Tabs (Cyanocobalamin) .... Take 1 tablet by mouth once a day 7)  Calcium-magnesium-vitamin D 200-100-33.3 Mg-mg-unit Caps (Calcium-magnesium-vitamin d) .... Take 1 capsule by mouth once a day  Other Orders: Excise lesion (TAL) 0.6 - 1.0 (11401)  Current Allergies (reviewed  today): No known allergies

## 2010-12-03 NOTE — Assessment & Plan Note (Signed)
Summary: STOMACH PAIN/[facility]   Vital Signs:  Patient profile:   77 year old female Height:      65.5 inches Weight:      210 pounds BMI:     34.54 Pulse rate:   70 / minute BP sitting:   120 / 70  Vitals Entered By: Shary Decamp (January 21, 2010 2:47 PM) CC: rov - not on any meds, unable to tolerate nexium - stools black   History of Present Illness: chief complaint today is abdominal pain she has a long history of low , bilateral, gas-like abdominal pain usually these episodes are f/u by nausea,  vomiting and diarrhea denies blood in the stools but nexium "make the stools dark" she had hematemesis in the past and was admitted to hospital she went to the ER in [place] in January with the same symptoms, she was prescribed hydrocodone and zofran.  No records available    Current Medications (verified): 1)  None  Allergies (verified): No Known Drug Allergies  Past History:  Past Medical History: AODM GERD Osteopenia abnormal breast exam 2004 - [facility] NEG (eval by surgeon neg) h/o SBO 2004, and 7-09 h/o iritis aprox 2005 Osteoarthritis Hyperlipidemia normal carotid ultrasound 01/2009  Past Surgical History: Reviewed history from 01/03/2009 and no changes required. Hysterectomy in the 70s, for bleeding, no cancer  Oophorectomy Bilat per pt  Social History: Reviewed history from 12/01/2007 and no changes required. lives by herself, her family is in [place] widow 2 kids (they live in [place]) has her own bussiness, makes aprons  Review of Systems       denies chest pain or shortness of breath today no edema CBGs no more than 100 sitting when checked denies dysuria or hematuria she continued with severe OA pain, she is getting local knee injections, thinking about a total knee replacement for December 2011 GI:  no dysphagia .  Physical Exam  General:  alert, well-developed, and well-nourished.   Lungs:  normal respiratory effort, no intercostal retractions, no  accessory muscle use, and normal breath sounds.    Heart:  normal rate, regular rhythm, no murmur, and no gallop.   Abdomen:  soft, non-tender, no distention, no masses, no guarding, no rigidity, and no rebound tenderness.    Extremities:  no edema,  calves symmetric and nontender   Impression & Recommendations:  Problem # 1:  ABDOMINAL PAIN, RECURRENT (ICD-789.00)  h/o abdominal pain as described above history of SBO twice, last time 2009, was admitted to the hospital, self resolved chart is reviewed, she had a CT of the abdomen and pelvis  March 2010: no acute, + diverticuli no recent ultrasound that I can tell last colonoscopy 2004 Plan: GI referral--DECLINED D/T COST, LIKES TO BE REASSES IN THE SUMMER   Orders: TLB-CBC Platelet - w/Differential (85025-CBCD)  Problem # 2:  OSTEOARTHRITIS (ICD-715.90) cannot tolerate meloxicam  she continued with severe OA pain, she is getting local knee injections, thinking about a total knee replacement for December 2011  The following medications were removed from the medication list:    Vicodin 5-500 Mg Tabs (Hydrocodone-acetaminophen) .Marland Kitchen... 1 or 2 every 6 hours as needed pain watch for somnolence    Meloxicam 7.5 Mg Tabs (Meloxicam) .Marland Kitchen... Per ortho    Ultram 50 Mg Tabs (Tramadol hcl) .Marland Kitchen... Per ortho  Problem # 3:  AODM (ICD-250.00) on diet only, labs Labs Reviewed: Creat: 0.8 (01/03/2009)     Last Eye Exam: normal (08/14/2009) Reviewed HgBA1c results: 7.0 (01/03/2009)  7.0 (06/02/2008)  Orders: Venipuncture ([address]) TLB-BMP (Basic Metabolic Panel-BMET) (80048-METABOL) TLB-A1C / Hgb A1C (Glycohemoglobin) (83036-A1C) TLB-Microalbumin/Creat Ratio, Urine (82043-MALB)  Problem # 4:  time spent more than 25 minutes d/t/ chart review in reference  to recurrent abd pain   Patient Instructions: 1)  Please schedule a follow-up appointment in 3 months (fasting, yearly checkup)

## 2010-12-03 NOTE — Progress Notes (Signed)
Summary: needs more labs, ordering an echocardiogramlab appt scheculed   Phone Note Outgoing Call   Summary of Call: advise patient: diabetes well controlled Hg slightly  lower than before  I will order a echocardiogram and further labs to r/o iron deficiency  before she is cleared  please ask the patient to come back for a iron, ferritin, B12 and folic acid DX anemia Jose E. Paz MD  August 31, 2010 10:04 AM     Follow-up for Phone Call        left message to call back.[name]  September 02, 2010 11:39 AM  Additional Follow-up for Phone Call Additional follow up Details #1::        left message for pt to call back. [name] CMA  September 03, 2010 8:53 AM     Additional Follow-up for Phone Call Additional follow up Details #2::    Pt is aware. [name] CMA  September 03, 2010 9:41 AM  patient is scheduled for lab [phone] .Marland KitchenOkey Regal Taylor  September 03, 2010 10:27 AM

## 2010-12-03 NOTE — Assessment & Plan Note (Signed)
Summary: 5 DAY ROA///[facility]   Vital Signs:  Patient profile:   77 year old female Weight:      207.38 pounds Pulse rate:   92 / minute Pulse rhythm:   regular BP sitting:   130 / 76  (left arm) Cuff size:   large  Vitals Entered By: Army Fossa CMA (May 22, 2010 2:21 PM) CC: 5 day follow up. Comments Check Incision spot. Having ringing her ears. Refill on Amoxicillin.   History of Present Illness:  -suture removal, no redness or d/c  -Refill on DM  medicines  -having knee surgery in November   Current Medications (verified): 1)  Metformin Hcl 500 Mg Tabs (Metformin Hcl) .... Tak One Tablet Two Times A Day 2)  Amoxicillin 500 Mg Caps (Amoxicillin) .... 2 By Mouth Twice A Day For One Week 3)  Aspirin 81 Mg Tbec (Aspirin) .... One Tablet Daily 4)  Simvastatin 20 Mg Tabs (Simvastatin) .Marland Kitchen.. 1 By Mouth At Bedtime. 5)  Centrum Silver Ultra Womens  Tabs (Multiple Vitamins-Minerals) .... Take 1 Tablet By Mouth Once A Day 6)  Vitamin B-12 50 Mcg Tabs (Cyanocobalamin) .... Take 1 Tablet By Mouth Once A Day 7)  Calcium-Magnesium-Vitamin D 200-100-33.3 Mg-Mg-Unit Caps (Calcium-Magnesium-Vitamin D) .... Take 1 Capsule By Mouth Once A Day  Allergies (verified): No Known Drug Allergies  Physical Exam  General:  alert and well-developed.   Skin:  wound in the lower abdomen without redness or discharge. No completely healed   Impression & Recommendations:  Problem # 1:  SEBORRHEIC KERATOSIS (ICD-702.19)  status post excision, patient aware pathology was benign Advised patient to come back in 2 days for reassessment of the wound, likely the sutures will be ready to come out then  Orders: No Charge Patient Arrived (NCPA0) (NCPA0)  Problem # 2:  OSTEOARTHRITIS (ICD-715.90) planning to have a knee surgery in November, recommend to come back one month before the surgery for clearance Her updated medication list for this problem includes:    Aspirin 81 Mg Tbec (Aspirin) ..... One  tablet daily  Complete Medication List: 1)  Metformin Hcl 500 Mg Tabs (Metformin hcl) .... Tak one tablet two times a day 2)  Amoxicillin 500 Mg Caps (Amoxicillin) .... 2 by mouth twice a day for one week 3)  Aspirin 81 Mg Tbec (Aspirin) .... One tablet daily 4)  Simvastatin 20 Mg Tabs (Simvastatin) .Marland Kitchen.. 1 by mouth at bedtime. 5)  Centrum Silver Ultra Womens Tabs (Multiple vitamins-minerals) .... Take 1 tablet by mouth once a day 6)  Vitamin B-12 50 Mcg Tabs (Cyanocobalamin) .... Take 1 tablet by mouth once a day 7)  Calcium-magnesium-vitamin D 200-100-33.3 Mg-mg-unit Caps (Calcium-magnesium-vitamin d) .... Take 1 capsule by mouth once a day  Other Orders: Prescription Created Electronically [phone])  Patient Instructions: 1)  came back in 2 days for suture removal 2)  came back 1 month before the knee surgery (schedule for November)  for clearence  Prescriptions: METFORMIN HCL 500 MG TABS (METFORMIN HCL) tak one tablet two times a day  #60 Tablet x 6   Entered and Authorized by:   Nolon Rod. Kyi Romanello MD   Signed by:   Nolon Rod. Kairen Hallinan MD on 05/22/2010   Method used:   Elect[facility] to        [facility] [address] [id number]       [address]       [place], [place]  [address]  Ph: [phone]       Fax: [phone]   RxID:   [id number]

## 2010-12-03 NOTE — Progress Notes (Signed)
Summary: ?nexium  Phone Note Call from Patient Call back at Home Phone [phone] Call back at Work Phone [phone]   Caller: Patient Summary of Call: Patient went to ortho (Dr. Madelon Lips).  He d/c'd celebrex & started pt on Ultram 50, meloxicam 7.5mg  daily.  He suggested that PCP start patient on nexium. [facility] [address] Shary Decamp  December 18, 2009 11:44 AM   Follow-up for Phone Call        ok to start nexium 40mg  before breakfast for GI protection if she has GI s/e (nausea, pain) needs to stop meds and call us [facility] E. Arine Foley MD  December 18, 2009 12:45 PM   rx called, discussed with pt Shary Decamp  December 18, 2009 1:39 PM      New/Updated Medications: MELOXICAM 7.5 MG TABS (MELOXICAM) per ortho ULTRAM 50 MG TABS (TRAMADOL HCL) per ortho NEXIUM 40 MG CPDR (ESOMEPRAZOLE MAGNESIUM) 1 by mouth before breakfast Prescriptions: NEXIUM 40 MG CPDR (ESOMEPRAZOLE MAGNESIUM) 1 by mouth before breakfast  #30 x 1   Entered by:   Shary Decamp   Authorized by:   Nolon Rod. Euan Wandler MD   Signed by:   Shary Decamp on 12/18/2009   Method used:   [facility]        [id number]       [address]       [place], [place]  [address]       Ph: [phone]       Fax: [phone]   RxID:   [id number]

## 2010-12-03 NOTE — Letter (Signed)
Summary: Primary Care Consult Scheduled Letter  [facility] [place]  [address] [place], [place] [address]   Phone: [phone]  Fax: [phone]      09/03/2010 MRN: [id number]  [facility] [address] [place], [place]  [address]    Dear Ms. Farro,    We have scheduled an appointment for you.  At the recommendation of Dr. Willow Ora, we have scheduled you for an Echocardiogram with [facility] on 09-12-2010 at 3:00pm.  Their address is [address], [address], [place] [address]. The office phone number is [phone].  If this appointment day and time is not convenient for you, please feel free to call the office of the doctor you are being referred to at the number listed above and reschedule the appointment.    It is important for you to keep your scheduled appointments. We are here to make sure you are given good patient care.   Thank you,    Renee, Patient Care Coordinator [facility]

## 2010-12-03 NOTE — Letter (Signed)
Summary: knee pain evaluation  [facility]   Imported By: Lanelle Bal 05/13/2010 12:47:57  _____________________________________________________________________  External Attachment:    Type:   Image     Comment:   External Document

## 2010-12-03 NOTE — Letter (Signed)
Summary: no retinopathy--[facility]   Imported By: Lanelle Bal 08/28/2010 09:41:39  _____________________________________________________________________  External Attachment:    Type:   Image     Comment:   External Document

## 2010-12-03 NOTE — Progress Notes (Signed)
Summary: cleared for surgery  Phone Note Outgoing Call   Summary of Call: advised patient, she is cleared for surgery. please talk to [facility] nurse  She is cleared for surgery, she will need metoprolol 25 mg b.i.d. starting the day of the surgery x  3 days forward to them: form clearing her  for surgery Last office visit note All recent labs EKG Echocardiogram report Jose E. Paz MD  September 18, 2010 8:30 AM   Follow-up for Phone Call        Patient and [facility] staff notified. Follow-up by: Lucious Groves CMA,  September 18, 2010 10:32 AM

## 2010-12-03 NOTE — Progress Notes (Signed)
Summary: arthritiis  Phone Note Call from Patient   Summary of Call: Pt states that she has arthritis really bad in her "tailbone", she said that you would call something in for her for this problem. She states it is giving her a horrible time and causing a lot of pain. Please advise if you would call a med in for her or if she needs to see you. Pharm- [facility]. Army Fossa CMA  June 05, 2010 11:50 AM   Follow-up for Phone Call        Vicodin 5 mg one every 4 hours as needed for pain, #40, no refills. Watch for drowsiness If the pain is not better she needs to let me know or see her orthopedic doctor Follow-up by: Jaretzi Droz E. Myrle Wanek MD,  June 05, 2010 1:08 PM  Additional Follow-up for Phone Call Additional follow up Details #1::        Spoke with pt and she is aware. Army Fossa CMA  June 05, 2010 1:17 PM     New/Updated Medications: VICODIN 5-500 MG TABS (HYDROCODONE-ACETAMINOPHEN) one every 4 hours as needed for pain. Prescriptions: VICODIN 5-500 MG TABS (HYDROCODONE-ACETAMINOPHEN) one every 4 hours as needed for pain.  #40 x 0   Entered by:   Army Fossa CMA   Authorized by:   Nolon Rod. Annalysse Shoemaker MD   Signed by:   Army Fossa CMA on 06/05/2010   Method used:   Printed then faxed to ...       [facility] [address] [id number] ([facility]       [address]       [place], [place]  [address]       Ph: [phone]       Fax: [phone]   RxID:   [id number]

## 2010-12-03 NOTE — Assessment & Plan Note (Signed)
Summary: yearly checkup and yearly checkup/kdc   Vital Signs:  Patient profile:   77 year old female Height:      65 inches Weight:      208 pounds Temp:     98.9 degrees F oral Pulse rate:   66 / minute BP sitting:   122 / 62  (left arm)  Vitals Entered By: Jeremy Johann CMA (April 25, 2010 8:21 AM) CC: yearly Comments --fasting --refill REVIEWED MED LIST, PATIENT AGREED DOSE AND INSTRUCTION CORRECT    History of Present Illness: Here for Medicare AWV:  1.Risk factors based on Past M, S, F history: reviewed with patient 2 Physical Activities: very active , church work, visit the sick  3. Depression/mood: denies  4. Hearing: no problems  5. ADL's: totally independent  6. Fall Risk: no recent falls, low risk  7. Home Safety: low risk per patient, we discussed med alert  8 Height, weight, &visual acuity:see VS, due for eye doctor visit  9. Counseling: diet-exercise discussed  10.Labs ordered based on risk factors: see orders  11.  Referral Coordination-- Will set her up for a mammogram 12 Care Plan-- Will return in 4 months for his routine care and once a year for her physical 13.Cognitive Assessment-- doing well, memory normal, motor and attention span normal  in addition to the Medicare Visit we also assesed:  developed sore throat and chest congestion 2 weeks ago, symptoms persisted. She is having yellow sputum production and sinus congestion. Denies itchy eyes or itchy nose  AODM-- ambulatory CBGs in the 120s   Osteopenia-- good medication compliance w/  vit D over the counter daily. She did not take ergocalciferol as prescribed "that is too strong for me"  Has a mole in the lower abdomen for 2 years, recently bleeds a couple of times   Hyperlipidemia--not on meds, likes to see how her FLP came back    Allergies (verified): No Known Drug Allergies  Past History:  Past Medical History: AODM GERD Osteopenia abnormal breast exam 2004 - [facility] NEG (eval by  surgeon neg) h/o SBO 2004, and 7-09 h/o iritis aprox 2005 Osteoarthritis Hyperlipidemia normal carotid ultrasound 01/2009  Past Surgical History: Reviewed history from 01/03/2009 and no changes required. Hysterectomy in the 70s, for bleeding, no cancer  Oophorectomy Bilat per pt  Family History: Reviewed history from 12/01/2007 and no changes required. DM-- father  MI--no  BREAST CA-- SISTER SUDDEN DEATH--?sister colon ca-no  Social History: lives by herself, her family is in [place], lost   2 sisters  widow 2 kids (they live in [place]) has her own bussiness, makes [employer]-- never  ETOH--never   Review of Systems CV:  Denies chest pain or discomfort and swelling of feet. GI:  Denies bloody stools, nausea, and vomiting. GU:  vag d/c, bleeding  not doing SBE.  Physical Exam  General:  alert and well-developed.   Head:  face symmetric Ears:  R ear normal and L ear normal.   Nose:  congested Mouth:  no redness or discharge Neck:  no masses, no thyromegaly, and normal carotid upstroke.   Breasts:  No mass, nodules, thickening, tenderness, bulging, retraction, inflamation, nipple discharge or skin changes noted.  no lymphadenopathies Lungs:  normal respiratory effort, no intercostal retractions, no accessory muscle use, and normal breath sounds.   Heart:  normal rate, regular rhythm, no murmur, and no gallop.   Abdomen:  soft, non-tender, no distention, no masses, no guarding, and no rigidity.   Extremities:  no lower extremity edema Skin:  at the right lower quadrant of the abdomen has a 1.2 cm very dark, hard, elevated skin lesion. Some irritation Psych:  Cognition and judgment appear intact. Alert and cooperative with normal attention span and concentration.     Impression & Recommendations:  Problem # 1:  HEALTH SCREENING (ICD-V70.0) Td 09 Pneumonia shot 10-05 and 2010 shingles shot benefits d/w patient   2004: EGD and Cscope both normal ----> we'll  contact GI in  refer him to next colonoscopy  last PAP 2004 (-), reports normal PAPs few times after her hysterectomy in the 70s.  L breast u/s  and MMG 01-2009------ (-) , ordering a mammogram Breast exam today normal, encouraged SBE    Orders: First annual wellness visit with prevention plan  (Z6109) Radiology Referral (Radiology)  Problem # 2:  URI (ICD-465.9) Assessment: New  see  instructions  Her updated medication list for this problem includes:    Aspirin 81 Mg Tbec (Aspirin) ..... One tablet daily  Orders: Prescription Created Electronically [id number])  Problem # 3:  SKIN LESION (ICD-709.9) Assessment: New skin lesion in the lower abdomen likely seborrheic keratosis  but I recommend excision. Patient will schedule  Problem # 4:  HYPERLIPIDEMIA (ICD-272.4)  has not been willing to take medications but likes to see this FLP  and make a decision. Labs Labs Reviewed: SGOT: 18 (12/01/2007)   SGPT: 19 (12/01/2007)   HDL:43.7 (01/03/2009), 44.9 (12/01/2007)  LDL:141 (01/03/2009), 135 (12/01/2007)  Chol:197 (01/03/2009), 191 (12/01/2007)  Trig:60 (01/03/2009), 58 (12/01/2007)  Orders: Venipuncture (09811) TLB-Lipid Panel (80061-LIPID)  Problem # 5:  AODM (ICD-250.00) due for labs, encouraged on exercise Her updated medication list for this problem includes:    Metformin Hcl 500 Mg Tabs (Metformin hcl) .Marland Kitchen... Tak one tablet two times a day    Aspirin 81 Mg Tbec (Aspirin) ..... One tablet daily  Labs Reviewed: Creat: 0.7 (01/21/2010)     Last Eye Exam: normal (08/14/2009) Reviewed HgBA1c results: 7.2 (01/21/2010)  7.0 (01/03/2009)  Orders: TLB-A1C / Hgb A1C (Glycohemoglobin) (83036-A1C) TLB-ALT (SGPT) (84460-ALT) TLB-AST (SGOT) (84450-SGOT)  Problem # 6:  OSTEOPENIA (ICD-733.90) last exam 2007 the last vitamin D was slightly low, she decided not to take ergocalciferol ( "too strong") but takes daily over-the-counter vitamin D Plan Ordering a DEXA check vitamin  D  Orders: TLB-TSH (Thyroid Stimulating Hormone) (84443-TSH) T-Vitamin D (25-Hydroxy) (91478-29562) Radiology Referral (Radiology)  Complete Medication List: 1)  Metformin Hcl 500 Mg Tabs (Metformin hcl) .... Tak one tablet two times a day 2)  Amoxicillin 500 Mg Caps (Amoxicillin) .... 2 by mouth twice a day for one week 3)  Aspirin 81 Mg Tbec (Aspirin) .... One tablet daily  Patient Instructions: 1)  rest, fluids, Tylenol, Robitussin-DM 2)  Saline nose spray twice a day 3)  Amoxicillin as prescribed for one week 4)   call if not better in 10 days 5)  start taking aspirin 81 mg daily 6)  Please schedule a follow-up appointment in 4 months .  Prescriptions: AMOXICILLIN 500 MG CAPS (AMOXICILLIN) 2 by mouth twice a day for one week  #28 x 0   Entered and Authorized by:   Nolon Rod. Lacie Landry MD   Signed by:   Nolon Rod. Thanya Cegielski MD on 04/25/2010   Method used:   [facility]ronically to        [facility] [address] [id number]       [address]       [place]  [place], [place]  [address]       Ph: [phone]       Fax: [phone]   RxID:   [id number]   Appended Document: yearly checkup and yearly checkup/kdc per flag: Pt not due for Cscope until 04-2013

## 2010-12-03 NOTE — Letter (Signed)
Summary: Surgical Clearance/[facility]  Surgical Clearance/[facility]   Imported By: Lanelle Bal 09/27/2010 12:02:15  _____________________________________________________________________  External Attachment:    Type:   Image     Comment:   External Document

## 2010-12-03 NOTE — Assessment & Plan Note (Signed)
Summary: stich removal/paz pt//kn   Vital Signs:  Patient profile:   77 year old female Height:      65 inches (165.10 cm) Weight:      208.25 pounds (94.66 kg) BMI:     34.78 Temp:     98.1 degrees F (36.72 degrees C) oral BP sitting:   124 / 70  (right arm) Cuff size:   large  Vitals Entered By: Lucious Groves CMA (May 24, 2010 4:13 PM) CC: Stitch removal./kb Is Patient Diabetic? No Pain Assessment Patient in pain? no        History of Present Illness: 77 yo woman here today for stitch removal.  pt w/out complaint.  no drainage, pain, redness.  Current Medications (verified): 1)  Metformin Hcl 500 Mg Tabs (Metformin Hcl) .... Tak One Tablet Two Times A Day 2)  Aspirin 81 Mg Tbec (Aspirin) .... One Tablet Daily 3)  Simvastatin 20 Mg Tabs (Simvastatin) .Marland Kitchen.. 1 By Mouth At Bedtime. 4)  Centrum Silver Ultra Womens  Tabs (Multiple Vitamins-Minerals) .... Take 1 Tablet By Mouth Once A Day 5)  Vitamin B-12 50 Mcg Tabs (Cyanocobalamin) .... Take 1 Tablet By Mouth Once A Day 6)  Calcium-Magnesium-Vitamin D 200-100-33.3 Mg-Mg-Unit Caps (Calcium-Magnesium-Vitamin D) .... Take 1 Capsule By Mouth Once A Day  Allergies (verified): No Known Drug Allergies  Review of Systems      See HPI  Physical Exam  General:  alert and well-developed.   Skin:  wound in the lower abdomen without redness or discharge. well healed, skin starting to overgrow stitches.   Impression & Recommendations:  Problem # 1:  SKIN LESION (ICD-709.9) Assessment Unchanged 3 stitches removed from R lower abd.  skin had started to overgrow stitches- scant bleeding when stitches removed, minimal separation of superficial skin on R margin.  area well approximated w/ steri-strips.  reviewed supportive care and red flags that should prompt return.  Pt expresses understanding and is in agreement w/ this plan.  Complete Medication List: 1)  Metformin Hcl 500 Mg Tabs (Metformin hcl) .... Tak one tablet two times a  day 2)  Aspirin 81 Mg Tbec (Aspirin) .... One tablet daily 3)  Simvastatin 20 Mg Tabs (Simvastatin) .Marland Kitchen.. 1 by mouth at bedtime. 4)  Centrum Silver Ultra Womens Tabs (Multiple vitamins-minerals) .... Take 1 tablet by mouth once a day 5)  Vitamin B-12 50 Mcg Tabs (Cyanocobalamin) .... Take 1 tablet by mouth once a day 6)  Calcium-magnesium-vitamin D 200-100-33.3 Mg-mg-unit Caps (Calcium-magnesium-vitamin d) .... Take 1 capsule by mouth once a day  Patient Instructions: 1)  The steri-strips will fall off when they are ready 2)  If you develop spreading redness or pus- please call 3)  Keep area clean 4)  Call with any questions or concerns 5)  Have a great weekend!

## 2010-12-05 NOTE — Assessment & Plan Note (Signed)
Summary: 2 WEEK OV//PH   Vital Signs:  Patient profile:   77 year old female Weight:      194 pounds Pulse rate:   79 / minute Pulse rhythm:   regular BP sitting:   130 / 84  (left arm) Cuff size:   regular  Vitals Entered By: Army Fossa CMA (November 13, 2010 1:11 PM) CC: follow up visit- fasting  Comments needs a pain med [facility] [address]    History of Present Illness: ROV Had a total knee replacement last month, still hurting quite a bit. She has been intolerant to most pain medicine, only taking meloxicam from time to time. Not taking Tylenol. Hospital records reviewed Labs from hospital reviewed   ROS No chest pain or shortness of breath still unable to do her activities of daily living, fortunately she has volunteers come to help her CBGs around 130 when checked, that is more than her baseline despite the fact that she is not eating much as before Good medication compliance with metformin and simvastatin but she discontinued aspirin findings.  Current Medications (verified): 1)  Metformin Hcl 500 Mg Tabs (Metformin Hcl) .... Tak One Tablet Two Times A Day 2)  Simvastatin 20 Mg Tabs (Simvastatin) .Marland Kitchen.. 1 By Mouth At Bedtime.  Allergies (verified): No Known Drug Allergies  Past History:  Past Medical History: Reviewed history from 04/25/2010 and no changes required. AODM GERD Osteopenia abnormal breast exam 2004 - [facility] NEG (eval by surgeon neg) h/o SBO 2004, and 7-09 h/o iritis aprox 2005 Osteoarthritis Hyperlipidemia normal carotid ultrasound 01/2009  Past Surgical History: Hysterectomy in the 70s, for bleeding, no cancer  Oophorectomy Bilat per pt R TKR 12-11  Physical Exam  General:  alert and well-developed.  alert and well-developed.   Lungs:  normal respiratory effort, no intercostal retractions, no accessory muscle use, and normal breath sounds.   Heart:  normal rate, regular rhythm, no murmur, and no gallop.     Impression &  Recommendations:  Problem # 1:  HYPERLIPIDEMIA (ICD-272.4) well controlled per her last FLP in the hospital she is holding aspirin,  recommended to restart Her updated medication list for this problem includes:    Simvastatin 20 Mg Tabs (Simvastatin) .Marland Kitchen... 1 by mouth at bedtime.  Her updated medication list for this problem includes:    Simvastatin 20 Mg Tabs (Simvastatin) .Marland Kitchen... 1 by mouth at bedtime.  Problem # 2:  OSTEOARTHRITIS (ICD-715.90) status post total knee replacement Still hurting, intolerant to pain medicine see instructions   Her updated medication list for this problem includes:    Aspirin 81 Mg Tbec (Aspirin) ..... One tablet daily  The following medications were removed from the medication list:    Vicodin 5-500 Mg Tabs (Hydrocodone-acetaminophen) ..... One every 4 hours as needed for pain. Her updated medication list for this problem includes:    Aspirin 81 Mg Tbec (Aspirin) ..... One tablet daily  Problem # 3:  AODM (ICD-250.00) well controlled per her last hemoglobin A1c at the hospital  Her updated medication list for this problem includes:    Metformin Hcl 500 Mg Tabs (Metformin hcl) .Marland Kitchen... Tak one tablet two times a day    Aspirin 81 Mg Tbec (Aspirin) ..... One tablet daily  Labs Reviewed: Creat: 0.7 (08/26/2010)     Last Eye Exam: normal (08/14/2009) Reviewed HgBA1c results: 6.7 (08/26/2010)  6.7 (04/25/2010)  Her updated medication list for this problem includes:    Metformin Hcl 500 Mg Tabs (Metformin hcl) .Marland Kitchen... Tak one  tablet two times a day    Aspirin 81 Mg Tbec (Aspirin) ..... One tablet daily  Problem # 4:  ANEMIA (ICD-285.9) Assessment: New also post op  anemia, hemoglobin of the hospital was 9.2, she got  2 PRBCs  (Hg 09-2010 was 11.6)  today his hemoglobin is 9.6. Plan: Start iron restart all her multivitamins   Her updated medication list for this problem includes:    Vitamin B-12 50 Mcg Tabs (Cyanocobalamin) .Marland Kitchen... Take 1 tablet by  mouth once a day    Iron Supplement 325 (65 Fe) Mg Tabs (Ferrous sulfate) .Marland Kitchen... 1 by mouth three times a day    Iron Supplement 325 (65 Fe) Mg Tabs (Ferrous sulfate) .Marland Kitchen... 1 by mouth three times a day  Her updated medication list for this problem includes:    Vitamin B-12 50 Mcg Tabs (Cyanocobalamin) .Marland Kitchen... Take 1 tablet by mouth once a day  Orders: Hgb (16109)  Complete Medication List: 1)  Metformin Hcl 500 Mg Tabs (Metformin hcl) .... Tak one tablet two times a day 2)  Aspirin 81 Mg Tbec (Aspirin) .... One tablet daily 3)  Simvastatin 20 Mg Tabs (Simvastatin) .Marland Kitchen.. 1 by mouth at bedtime. 4)  Centrum Silver Ultra Womens Tabs (Multiple vitamins-minerals) .... Take 1 tablet by mouth once a day 5)  Vitamin B-12 50 Mcg Tabs (Cyanocobalamin) .... Take 1 tablet by mouth once a day 6)  Calcium-magnesium-vitamin D 200-100-33.3 Mg-mg-unit Caps (Calcium-magnesium-vitamin d) .... Take 1 capsule by mouth once a day 7)  Iron Supplement 325 (65 Fe) Mg Tabs (Ferrous sulfate) .Marland Kitchen.. 1 by mouth three times a day  Patient Instructions: 1)  Tylenol 500 mg ---- 1 or 2 tablets every 6 hours as needed for pain. Do not take more than 8 tablets daily 2)  meloxicam as prescribed by the orthopedic doctor as needed for pain 3)  Please schedule a follow-up appointment in 6 weeks  Prescriptions: IRON SUPPLEMENT 325 (65 FE) MG TABS (FERROUS SULFATE) 1 by mouth three times a day  #90 x 3   Entered and Authorized by:   Elita Quick E. Viyaan Champine MD   Signed by:   Nolon Rod. Jasiyah Paulding MD on 11/13/2010   Method used:   Print then Give to Patient   RxID:   [id number]    Orders Added: 1)  Hgb [85018] 2)  Est. Patient Level III [99213]    Laboratory Results   CBC   HGB:  9.6 g/dL   (Normal Range: 95.6-21.3 in Males, 12.0-15.0 in Females)

## 2010-12-25 ENCOUNTER — Ambulatory Visit (INDEPENDENT_AMBULATORY_CARE_PROVIDER_SITE_OTHER): Payer: Medicare Other | Admitting: Internal Medicine

## 2010-12-25 ENCOUNTER — Other Ambulatory Visit: Payer: Self-pay | Admitting: Internal Medicine

## 2010-12-25 ENCOUNTER — Encounter: Payer: Self-pay | Admitting: Internal Medicine

## 2010-12-25 DIAGNOSIS — D649 Anemia, unspecified: Secondary | ICD-10-CM

## 2010-12-25 DIAGNOSIS — Z79899 Other long term (current) drug therapy: Secondary | ICD-10-CM

## 2010-12-25 LAB — CBC WITH DIFFERENTIAL/PLATELET
Basophils Relative: 0.4 % (ref 0.0–3.0)
Eosinophils Absolute: 0.1 10*3/uL (ref 0.0–0.7)
Eosinophils Relative: 1.4 % (ref 0.0–5.0)
Lymphocytes Relative: 47.8 % — ABNORMAL HIGH (ref 12.0–46.0)
Neutrophils Relative %: 42.9 % — ABNORMAL LOW (ref 43.0–77.0)
RBC: 4.1 Mil/uL (ref 3.87–5.11)
WBC: 8.1 10*3/uL (ref 4.5–10.5)

## 2010-12-25 LAB — B12 AND FOLATE PANEL: Folate: >24.8 ng/mL (ref >5.9)

## 2010-12-25 LAB — IBC PANEL
Saturation Ratios: 21 % (ref 20.0–50.0)
Transferrin: 190.2 mg/dL — ABNORMAL LOW (ref 212.0–360.0)

## 2010-12-31 NOTE — Assessment & Plan Note (Signed)
Summary: 6 week follow up/rh.......Marland Kitchen   Vital Signs:  Patient profile:   77 year old female Weight:      199 pounds Temp:     99.1 degrees F oral Pulse rate:   80 / minute Pulse rhythm:   regular BP sitting:   140 / 78  (left arm) Cuff size:   regular  Vitals Entered By: Army Fossa CMA (December 25, 2010 1:03 PM) CC: f/u on iron? Comments fasting since 9am   History of Present Illness: DJD-- pain beter w/  tramadol anemia-- good medication compliance w/ iron  ROS no N-V-D patient is constipated since she started iron, managing w/ OTC colace and change in diet CBGs remain wnl  more active, s/p TKR,  surgery healing well, driving    Current Medications (verified): 1)  Metformin Hcl 500 Mg Tabs (Metformin Hcl) .... Tak One Tablet Two Times A Day 2)  Aspirin 81 Mg Tbec (Aspirin) .... One Tablet Daily 3)  Simvastatin 20 Mg Tabs (Simvastatin) .Marland Kitchen.. 1 By Mouth At Bedtime. 4)  Centrum Silver Ultra Womens  Tabs (Multiple Vitamins-Minerals) .... Take 1 Tablet By Mouth Once A Day 5)  Vitamin B-12 50 Mcg Tabs (Cyanocobalamin) .... Take 1 Tablet By Mouth Once A Day 6)  Calcium-Magnesium-Vitamin D 200-100-33.3 Mg-Mg-Unit Caps (Calcium-Magnesium-Vitamin D) .... Take 1 Capsule By Mouth Once A Day 7)  Iron Supplement 325 (65 Fe) Mg Tabs (Ferrous Sulfate) .Marland Kitchen.. 1 By Mouth Three Times A Day  Allergies (verified): No Known Drug Allergies  Past History:  Past Medical History: Reviewed history from 04/25/2010 and no changes required. AODM GERD Osteopenia abnormal breast exam 2004 - [facility] NEG (eval by surgeon neg) h/o SBO 2004, and 7-09 h/o iritis aprox 2005 Osteoarthritis Hyperlipidemia normal carotid ultrasound 01/2009  Past Surgical History: Reviewed history from 11/13/2010 and no changes required. Hysterectomy in the 70s, for bleeding, no cancer  Oophorectomy Bilat per pt R TKR 12-11  Physical Exam  General:  alert and well-developed.  alert and well-developed.   Lungs:   normal respiratory effort, no intercostal retractions, no accessory muscle use, and normal breath sounds.   Heart:  normal rate, regular rhythm, no murmur, and no gallop.   Msk:  mobility better, still uses a cane    Impression & Recommendations:  Problem # 1:  ANEMIA (ICD-285.9) post op  anemia, hemoglobin of the hospital was 9.2, she got  2 PRBCs  @ lst OV  hemoglobin  was 9.6. on iron, labs, if still needs iron for several weeks, will change formulation d/t constipation she is taking all her vitamins  Her updated medication list for this problem includes:    Vitamin B-12 50 Mcg Tabs (Cyanocobalamin) .Marland Kitchen... Take 1 tablet by mouth once a day    Iron Supplement 325 (65 Fe) Mg Tabs (Ferrous sulfate) .Marland Kitchen... 1 by mouth three times a day     Problem # 2:  OSTEOARTHRITIS (ICD-715.90) recovering well from TKR Her updated medication list for this problem includes:    Aspirin 81 Mg Tbec (Aspirin) ..... One tablet daily  Complete Medication List: 1)  Metformin Hcl 500 Mg Tabs (Metformin hcl) .... Tak one tablet two times a day 2)  Aspirin 81 Mg Tbec (Aspirin) .... One tablet daily 3)  Simvastatin 20 Mg Tabs (Simvastatin) .Marland Kitchen.. 1 by mouth at bedtime. 4)  Centrum Silver Ultra Womens Tabs (Multiple vitamins-minerals) .... Take 1 tablet by mouth once a day 5)  Vitamin B-12 50 Mcg Tabs (Cyanocobalamin) .... Take 1 tablet  by mouth once a day 6)  Calcium-magnesium-vitamin D 200-100-33.3 Mg-mg-unit Caps (Calcium-magnesium-vitamin d) .... Take 1 capsule by mouth once a day 7)  Iron Supplement 325 (65 Fe) Mg Tabs (Ferrous sulfate) .Marland Kitchen.. 1 by mouth three times a day  Other Orders: Venipuncture (96295) TLB-CBC Platelet - w/Differential (85025-CBCD) TLB-IBC Pnl (Iron/FE;Transferrin) (83550-IBC) TLB-B12 + Folate Pnl (28413_24401-U27/OZD) Specimen Handling (66440)  Patient Instructions: 1)  Please schedule a follow-up appointment in 4 months .    Orders Added: 1)  Venipuncture [36415] 2)  TLB-CBC  Platelet - w/Differential [85025-CBCD] 3)  TLB-IBC Pnl (Iron/FE;Transferrin) [83550-IBC] 4)  TLB-B12 + Folate Pnl [82746_82607-B12/FOL] 5)  Specimen Handling [99000] 6)  Est. Patient Level III [34742]

## 2011-01-13 LAB — LIPID PANEL
HDL: 47 mg/dL (ref >39)
Total CHOL/HDL Ratio: 2.9 RATIO
Triglycerides: 48 mg/dL (ref <150)
VLDL: 10 mg/dL (ref 0–40)

## 2011-01-13 LAB — URINALYSIS, ROUTINE W REFLEX MICROSCOPIC
Bilirubin Urine: NEGATIVE
Glucose, UA: NEGATIVE mg/dL
Glucose, UA: NEGATIVE mg/dL
Ketones, ur: NEGATIVE mg/dL
Ketones, ur: NEGATIVE mg/dL
Nitrite: NEGATIVE
Protein, ur: NEGATIVE mg/dL
Specific Gravity, Urine: 1.008 (ref 1.005–1.030)
pH: 6.5 (ref 5.0–8.0)
pH: 6.5 (ref 5.0–8.0)

## 2011-01-13 LAB — CBC
HCT: 23.9 % — ABNORMAL LOW (ref 36.0–46.0)
HCT: 28 % — ABNORMAL LOW (ref 36.0–46.0)
HCT: 28.5 % — ABNORMAL LOW (ref 36.0–46.0)
HCT: 33.2 % — ABNORMAL LOW (ref 36.0–46.0)
Hemoglobin: 10.8 g/dL — ABNORMAL LOW (ref 12.0–15.0)
Hemoglobin: 7.8 g/dL — ABNORMAL LOW (ref 12.0–15.0)
Hemoglobin: 9.2 g/dL — ABNORMAL LOW (ref 12.0–15.0)
MCH: 27.1 pg (ref 26.0–34.0)
MCH: 27.5 pg (ref 26.0–34.0)
MCHC: 32.9 g/dL (ref 30.0–36.0)
MCV: 83.3 fL (ref 78.0–100.0)
MCV: 83.6 fL (ref 78.0–100.0)
MCV: 83.6 fL (ref 78.0–100.0)
MCV: 84.2 fL (ref 78.0–100.0)
Platelets: 148 10*3/uL — ABNORMAL LOW (ref 150–400)
Platelets: 195 10*3/uL (ref 150–400)
RBC: 2.86 MIL/uL — ABNORMAL LOW (ref 3.87–5.11)
RBC: 3.42 MIL/uL — ABNORMAL LOW (ref 3.87–5.11)
RBC: 3.97 MIL/uL (ref 3.87–5.11)
RDW: 13.6 % (ref 11.5–15.5)
WBC: 10.2 10*3/uL (ref 4.0–10.5)
WBC: 11.7 10*3/uL — ABNORMAL HIGH (ref 4.0–10.5)
WBC: 13.8 10*3/uL — ABNORMAL HIGH (ref 4.0–10.5)
WBC: 6.9 10*3/uL (ref 4.0–10.5)

## 2011-01-13 LAB — URINE MICROSCOPIC-ADD ON

## 2011-01-13 LAB — BASIC METABOLIC PANEL
BUN: 9 mg/dL (ref 6–23)
CO2: 28 mEq/L (ref 19–32)
CO2: 29 mEq/L (ref 19–32)
Calcium: 8.3 mg/dL — ABNORMAL LOW (ref 8.4–10.5)
Chloride: 100 mEq/L (ref 96–112)
Chloride: 103 mEq/L (ref 96–112)
Chloride: 106 mEq/L (ref 96–112)
Creatinine, Ser: 0.85 mg/dL (ref 0.4–1.2)
GFR calc Af Amer: >60 mL/min (ref >60)
GFR calc Af Amer: >60 mL/min (ref >60)
GFR calc Af Amer: >60 mL/min (ref >60)
GFR calc non Af Amer: >60 mL/min (ref >60)
Glucose, Bld: 142 mg/dL — ABNORMAL HIGH (ref 70–99)
Potassium: 3.9 mEq/L (ref 3.5–5.1)
Potassium: 4.4 mEq/L (ref 3.5–5.1)
Potassium: 4.5 mEq/L (ref 3.5–5.1)
Sodium: 138 mEq/L (ref 135–145)
Sodium: 138 mEq/L (ref 135–145)
Sodium: 140 mEq/L (ref 135–145)

## 2011-01-13 LAB — TYPE AND SCREEN

## 2011-01-13 LAB — COMPREHENSIVE METABOLIC PANEL
Albumin: 4.3 g/dL (ref 3.5–5.2)
Alkaline Phosphatase: 53 U/L (ref 39–117)
BUN: 9 mg/dL (ref 6–23)
Potassium: 4.7 mEq/L (ref 3.5–5.1)
Total Protein: 7.5 g/dL (ref 6.0–8.3)

## 2011-01-13 LAB — DIFFERENTIAL
Basophils Absolute: 0 10*3/uL (ref 0.0–0.1)
Basophils Relative: 0 % (ref 0–1)
Eosinophils Relative: 0 % (ref 0–5)
Lymphocytes Relative: 12 % (ref 12–46)
Lymphocytes Relative: 49 % — ABNORMAL HIGH (ref 12–46)
Lymphs Abs: 1.6 10*3/uL (ref 0.7–4.0)
Lymphs Abs: 3.4 10*3/uL (ref 0.7–4.0)
Monocytes Absolute: 0.4 10*3/uL (ref 0.1–1.0)
Monocytes Absolute: 0.7 10*3/uL (ref 0.1–1.0)
Monocytes Relative: 5 % (ref 3–12)
Monocytes Relative: 6 % (ref 3–12)
Neutro Abs: 11.5 10*3/uL — ABNORMAL HIGH (ref 1.7–7.7)
Neutro Abs: 3 10*3/uL (ref 1.7–7.7)

## 2011-01-13 LAB — GLUCOSE, CAPILLARY
Glucose-Capillary: 116 mg/dL — ABNORMAL HIGH (ref 70–99)
Glucose-Capillary: 120 mg/dL — ABNORMAL HIGH (ref 70–99)
Glucose-Capillary: 140 mg/dL — ABNORMAL HIGH (ref 70–99)
Glucose-Capillary: 141 mg/dL — ABNORMAL HIGH (ref 70–99)
Glucose-Capillary: 163 mg/dL — ABNORMAL HIGH (ref 70–99)
Glucose-Capillary: 187 mg/dL — ABNORMAL HIGH (ref 70–99)
Glucose-Capillary: 204 mg/dL — ABNORMAL HIGH (ref 70–99)

## 2011-01-13 LAB — PREPARE RBC (CROSSMATCH)

## 2011-01-13 LAB — URINE CULTURE
Colony Count: NO GROWTH
Culture  Setup Time: 201112092050

## 2011-02-13 LAB — URINALYSIS, ROUTINE W REFLEX MICROSCOPIC
Glucose, UA: NEGATIVE mg/dL
Hgb urine dipstick: NEGATIVE
Specific Gravity, Urine: 1.015 (ref 1.005–1.030)

## 2011-02-13 LAB — DIFFERENTIAL
Basophils Absolute: 0.1 10*3/uL (ref 0.0–0.1)
Eosinophils Relative: 1 % (ref 0–5)
Lymphocytes Relative: 37 % (ref 12–46)
Monocytes Absolute: 0.5 10*3/uL (ref 0.1–1.0)

## 2011-02-13 LAB — POCT I-STAT, CHEM 8
BUN: 14 mg/dL (ref 6–23)
Calcium, Ion: 1.12 mmol/L (ref 1.12–1.32)
Glucose, Bld: 124 mg/dL — ABNORMAL HIGH (ref 70–99)
TCO2: 26 mmol/L (ref 0–100)

## 2011-02-13 LAB — CBC
Platelets: 182 10*3/uL (ref 150–400)
WBC: 6.5 10*3/uL (ref 4.0–10.5)

## 2011-02-13 LAB — COMPREHENSIVE METABOLIC PANEL
AST: 20 U/L (ref 0–37)
Albumin: 3.7 g/dL (ref 3.5–5.2)
Alkaline Phosphatase: 52 U/L (ref 39–117)
Chloride: 105 mEq/L (ref 96–112)
Creatinine, Ser: 0.68 mg/dL (ref 0.4–1.2)
GFR calc Af Amer: >60 mL/min (ref >60)
Potassium: 4.4 mEq/L (ref 3.5–5.1)
Total Bilirubin: 0.7 mg/dL (ref 0.3–1.2)

## 2011-02-13 LAB — URINE MICROSCOPIC-ADD ON

## 2011-02-18 ENCOUNTER — Telehealth: Payer: Self-pay | Admitting: *Deleted

## 2011-02-18 MED ORDER — CELECOXIB 100 MG PO CAPS: ORAL_CAPSULE | ORAL | Status: DC

## 2011-02-18 NOTE — Telephone Encounter (Signed)
Okay to restart Celebrex, 100 mg one tablet twice a day. Advise patient to take as little as possible [facility]

## 2011-02-18 NOTE — Telephone Encounter (Signed)
Pt is aware.  

## 2011-03-18 NOTE — Consult Note (Signed)
[name], [name] NO.:  [id number]   MEDICAL RECORD NO.:  [id number]          PATIENT TYPE:  INP   LOCATION:  4732                         FACILITY:  [facility]   PHYSICIAN:  Lennie Muckle, MD      DATE OF BIRTH:  [birth year]   DATE OF CONSULTATION:  05/12/2008  DATE OF DISCHARGE:                                 CONSULTATION   REQUESTING PHYSICIAN:  Valerie A. Felicity Coyer, MD   REASON FOR CONSULTATION:  Small-bowel obstruction.   HISTORY OF PRESENT ILLNESS:  Ms. Wolman is a 77 year old otherwise  healthy patient, diet-controlled diabetes, who was admitted on May 11, 2008 with complaints of abdominal pain.  She presented to the ER.  Her  white count was normal.  She has had nausea and vomiting and diffuse  periumbilical abdominal pain.   Her past surgical history is consistent with prior hysterectomy in the  1960s.  She underwent a CT scan in the ER which revealed a small-bowel  obstruction, but an apparent transition zone in the right lower  quadrant, also fecalization into the small bowel.  She subsequently was  admitted and placed on bowel arrest with an NG tube.  X-ray today again  showed persistent small-bowel obstruction.  The patient has had  admission in 2004 for partial small-bowel obstruction which we resolved  with bowel arrest.  The patient was also in the hospital in May in the  ER for complaints of abdominal pain, but was discharged from the ER.   REVIEW OF SYSTEMS:  As per the history of present illness.  GI:  The  patient reports that she ate broccoli and sweet potatoes before the  onset of these symptoms.  She reports she had similar dietary intake  before her 2004 episode of partial small-bowel obstruction.  Since  admission, the patient has begun passing a small amount of flatus.  She  is having no pain or nausea and she has been up ambulating.  Otherwise,  review of systems category is a noncontributory.   FAMILY MEDICAL HISTORY:   Noncontributory.   SOCIAL HISTORY:  No alcohol.  No tobacco.  No drugs.   PAST MEDICAL HISTORY:  1. Osteoarthritis.  2. Diet-controlled diabetes.   PAST SURGICAL HISTORY:  Hysterectomy in the 1960s.   ALLERGIES:  NKDA.   MEDICATIONS AT HOME:  Multiple vitamin.   Since admission, the patient has been placed on following medications.  1. P.r.n. morphine.  2. Zofran.  3. Heparin for DVT prophylaxis.  4. Pneumococcal immunization.  5. Glucometers with sliding scale insulin.   PHYSICAL EXAMINATION:  CONSTITUTIONAL:  GENERAL:  Pleasant female patient reporting decrease in symptoms since  admission and bowel arrest initiated.  VITAL SIGNS:  Temperature 98.8, BP 138/79, pulse 90 and regular, and  respirations 20.  EYES:  Sclerae are nonicteric, noninjected.  Conjunctivae are pink.  Pupils are equal and reactive to light.  EARS, NOSE, MOUTH, AND THROAT:  Ears are symmetric in appearance without  otorrhea.  Nose is midline without rhinorrhea.  Mouth, oral mucous  membranes are pink and moist.  NECK:  Trachea  is midline.  Thyroid is down and nonpalpable.  No  appreciable masses in the neck.  RESPIRATIONS:  Effort is nonlabored.  Bilateral lung sounds are clear to  auscultation.  She is sating 97% on room air.  CARDIOVASCULAR:  Heart sounds are S1 and S2 without obvious murmurs,  rubs, or gallops.  No JVD.  No bruits.  She is in sinus rhythm.  Peripheral pulses are 1+ bilaterally radial, femoral, and pedal.  No  peripheral edema.  GI:  Abdomen is soft.  Bowel sounds are present, but very diminished.  She is nontender on palpation.  NG tube is a low-wall suction with  bilious returns 500 mL documented in the past 24 hours.  No incisional  or abdominal wall hernias noted.  MUSCULOSKELETAL:  Extremities are symmetrical in appearance without  clubbing or cyanosis.  SKIN:  Normal appearance without any unusual rashes, lesions, or masses.  NEUROLOGIC:  Cranial nerves II-XII are intact.   Sensation is intact  grossly in the upper and lower extremities bilaterally.  PSYCHIATRIC:  The patient is oriented to time, person, place, and  situation.  Her affect is appropriate to current situation.   LABORATORY DATA:  Hemoglobin A1c is 7.  Sodium is 139, potassium 3.8,  CO2 27, glucose 153, BUN 18, and creatinine 0.9.  White count 8600,  hemoglobin 11.9, and platelets 190,000.  Diagnostic CT of the abdomen  and pelvis as well as today's two-view abdominal x-ray as noted.   IMPRESSION:  1. Small-bowel obstruction with possible transition zone in the right      lower quadrant per CT slowly improving.  2. Diet-controlled diabetes with elevated hemoglobin A1c.   PLAN:  1. Continue bowel arrest, NG tube, and IV fluids and continue to      mobilize.  2. Consider checking abdominal x-ray in the morning.  At this point,      we would probably only do that if her condition worsens.  3. Continue NG tube once the patient has adequate bowel sounds and      passing flatus.  Begin with clear liquids and advance diet slowly.  4. At this time, no acute indications for surgical intervention.      Allison L. Kennith Center, MD  Electronically Signed    ALE/MEDQ  D:  05/12/2008  T:  05/13/2008  Job:  [id number]   cc:   Vikki Ports A. Felicity Coyer, MD

## 2011-03-18 NOTE — Discharge Summary (Signed)
[name], [name]              ACCOUNT NO.:  [id number]   MEDICAL RECORD NO.:  [id number]          PATIENT TYPE:  INP   LOCATION:  4732                         FACILITY:  [facility]   PHYSICIAN:  Valerie A. Felicity Taylor, MDDATE OF BIRTH:  Sep 14, 1930   DATE OF ADMISSION:  05/11/2008  DATE OF DISCHARGE:  05/17/2008                               DISCHARGE SUMMARY   DISCHARGE DIAGNOSES:  1. Small bowel obstruction, resolved.  2. Polymicrobial urinary tract infection, 35,000.  3. Diabetes type 2, diet controlled.  4. Osteopenia.   HISTORY OF PRESENT ILLNESS:  [name] is a 77 year old African-  American female admitted on May 11, 2008, with chief complaint of  abdominal pain, nausea, and vomiting.  She noted that this pain started  at 6-7 p.m. on May 10, 2008, and it was accompanied by nausea and  vomiting.  She denied fever.  Her office chart noted positive history of  small bowel obstruction in the past, which the patient reported to be  approximately 15 years ago.  She also reports a positive history of  hysterectomy about 50 years ago, but denied any other abdominal surgery.  She reported symptomatic relief since placed on nasogastric tube in the  emergency department.  She was admitted for further evaluation and  treatment.   PAST MEDICAL HISTORY:  1. Osteopenia.  2. GERD.  3. Iritis.  4. Diabetes type 2.  5. Small bowel obstruction in 2004.   COURSE OF HOSPITALIZATION:  1. Small bowel obstruction.  The patient was admitted.  An NG tube was      placed to low-wall suction.  Followup KUB performed on May 12, 2008, noted persistent small bowel obstruction without improvement.      A surgery consult was requested.  Thankfully, the patient did not      require surgical intervention, but her obstruction resolved with      continued conservative management.  At the time of this dictation,      she is currently tolerating p.o.'s without difficulty.  She has      positive bowel  sounds.  She is also passing formed stools.  Plan is      discharge home at this time with outpatient followup.   MEDICATIONS AT THE TIME OF DISCHARGE:  1. Multivitamin 1 tablet p.o. daily.  2. Protonix 40 mg p.o. daily.  3. Cipro 500-mg tab p.o. x1 tonight, then discontinue.  4. Aspirin 81 mg p.o. daily.  5. Calcium plus D 1 tablet p.o. b.i.d.   FOLLOWUP:  The patient is to follow up with Dr. Willow Taylor on June 08, 2008, at 12:45 p.m.   PERTINENT LABORATORIES AT THE TIME OF DISCHARGE:  Hemoglobin 11.6,  hematocrit 34.9, BUN 13, and creatinine 0.75.   SPECIAL INSTRUCTIONS:  The patient is instructed to return to the ER  should she develop nausea, vomiting, or abdominal pain.   Greater than 30 minutes was spent on discharge planning.      Theresa Craze, NP      Theresa Rover. Felicity Coyer, MD  Electronically Signed  MO/MEDQ  D:  05/17/2008  T:  05/17/2008  Job:  [id number]   cc:   Theresa Ora, MD

## 2011-03-21 NOTE — H&P (Signed)
NAME:  [name], [name]                        ACCOUNT NO.:  [id number]   MEDICAL RECORD NO.:  [id number]                   PATIENT TYPE:  INP   LOCATION:  5729                                 FACILITY:  [facility]   PHYSICIAN:  Lonzo Cloud. Kriste Basque, M.D. [facility]            DATE OF BIRTH:  06-19-1930   DATE OF ADMISSION:  03/18/2003  DATE OF DISCHARGE:                                HISTORY & PHYSICAL   HISTORY OF PRESENT ILLNESS:  The patient is a 77 year old black female,  patient of Dr. Elita Quick E. Paz who he has seen on one occasion in February of  2004 for an established visit.  She is in good general health.  She  previously lived in [place] where her local physician was Dr. Loleta Chance.  She  moved to [facility] in August of 2003 and established with Dr. Wanda Plump.   She presents to the emergency room on Mar 18, 2003 with a one-day history of  abdominal pain and swelling.  It started with epigastric pain about 6 p.m.  on the night prior to admission, after eating chili and a frosty.  She  developed epigastric discomfort, abdominal distention, swelling, followed by  nausea and vomiting with a dark black watery material but no red blood.  She  had a bad night with nausea, vomiting, and subsequently diarrhea also with  dark material but no blood seen.  She denied any fevers, chills, or sweats.  No one else was ill at home, etc.   She came to the emergency room around 12 o'clock at Mar 18, 2003 with these  progressive complaints.  She was seen by the emergency room physician and  had x-rays obtained which showed a small bowel obstruction.  They placed an  NG tube, put her on drainage which drained a modest amount of bilious  material.  She received some Morphine and Phenergan IV with some improvement  in her symptoms.  She was referred for admission.   PAST MEDICAL HISTORY:  As noted, the patient has enjoyed good general  medical health.  She had a total abdominal hysterectomy in the 1960s.  She  was hospitalized about 10 years ago in [place], [place] with what sounds  like a possible small bowel obstruction and had my stomach pumped.  She  was hospitalized for seven days and was discharged without surgery.  About  two years ago, she had a left breast biopsy by Dr. Katrinka Blazing in [place] and  this was benign.  She has a history of osteopenia on bone mineral density  testing and takes calcium and vitamins.   ALLERGIES:  She has no known drug allergies.   MEDICATIONS:  Naprosyn 500 mg q.d. which she takes for mild arthritis.   FAMILY HISTORY:  The patient's father is still alive at age 2.  History of  hypertension and diabetes but no known heart disease, cancer, etc.  The  patient's mother  died at age 71 after surgery for a bowel obstruction and  she was otherwise apparently in good health.  The patient has four siblings,  all sisters, and all still alive.  One sister has a history of breast  cancer.  One is currently undergoing treatment for a brain tumor.  One  sister has diabetes.   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  She is a nonsmoker and nondrinker.  She has three  children, all of whom are alive and well.   REVIEW OF SYMPTOMS:  The patient denies any history of hypertension, heart  disease, diabetes, cholesterol problems, etc.  She denies any recent  headaches or visual symptoms.  She denies chest pain or palpitations.  She  denies cough or sputum production.  She has had no GU symptoms.  She takes  Naprosyn for arthritis but has had no leg swelling.   PHYSICAL EXAMINATION:  GENERAL:  A 77 year old black female in no acute  distress.  Mild abdominal discomfort with treatment in the emergency room.  VITAL SIGNS:  Show blood pressure 122/54, pulse 80 per minute and regular,  respirations 20 per minute and not labored, temperature 98.0 degrees.  O2  saturation is 96% on room air.  HEENT:  Unremarkable.   NECK:  No jugular venous distention, no carotid bruits, no thyromegaly, or  lymphadenopathy.  CHEST:  Clear to auscultation and percussion.  HEART:  Regular rhythm, normal S1 and S2 without murmurs, rubs, or gallops  heard.  ABDOMEN:  NG tube in place with suction draining a moderate amount of  bilious material.  There is moderate epigastric tenderness on palpation.  Bowel sounds are intact.  There is no evidence of organomegaly or masses.  EXTREMITIES:  Venous insufficiency.  No clubbing, cyanosis, or edema.  NEUROLOGICAL:  Intact without focal abnormalities detected.   IMPRESSION:  Acute onset of abdominal pain and distention with nausea,  vomiting, and diarrhea.  X-rays seem to indicate a small bowel obstruction  as noted above.  She is placed on nasogastric suction, given pain and nausea  medication, intravenous Protonix prophylaxis, and PPD hose.  Surgical  consultation with Dr. [name]. [name] and follow-up studies ordered.                                               Lonzo Cloud. Kriste Basque, M.D. [facility]    SMN/MEDQ  D:  03/18/2003  T:  03/18/2003  Job:  [id number]   cc:   Wanda Plump, MD [facility]  [phone] [place], [place] [address]   [name]. [name], M.D.  [address]  [place]  [place] [address]  Fax: [phone]

## 2011-03-21 NOTE — Discharge Summary (Signed)
[name], [name]                        ACCOUNT NO.:  [id number]   MEDICAL RECORD NO.:  [id number]                   PATIENT TYPE:  INP   LOCATION:  5729                                 FACILITY:  [facility]   PHYSICIAN:  Rene Paci, M.D. [facility]          DATE OF BIRTH:  30-Nov-1929   DATE OF ADMISSION:  03/18/2003  DATE OF DISCHARGE:  03/22/2003                                 DISCHARGE SUMMARY   DISCHARGE DIAGNOSES:  1. Partial small bowel obstruction.  2. Hypertension.  3. Low grade fever.  4. Osteoarthritis.   BRIEF ADMISSION HISTORY:  The patient is a 77 year old African-American  female who was in her usual state of health until the day prior to  admission.  She then developed crampy mid and lower abdominal pain  associated with nausea and vomiting.  She also noted some diarrhea.  The  patient was evaluated in the emergency room where x-rays suggested a small  bowel obstruction.  She had an NG tube placed and was made n.p.o.  Surgery  was asked to see the patient.   PAST MEDICAL HISTORY:  1. Status post total abdominal hysterectomy through a midline incision 1960.  2. Questionable history of bowel obstruction 10 years ago.  3. Status post hemorrhoidectomy.  4. Osteoarthritis.   HOSPITAL COURSE:  Problem 1 - GASTROINTESTINAL:  The patient presented with  a probable partial small bowel obstruction.  The patient had an NG tube  placed and surgery was asked to see the patient.  [name]. [name], M.D.  saw the patient.  He agreed with the current treatment plan which was bowel  rest and NG tube as well as IV hydration.  We ordered serial a.m. abdominal  films.  The patient had partial small bowel obstruction slowly resolved.  Eventually, the NG was clamped and she was started on clear liquids which  she has tolerated.  Her diet has been slowly advanced.  She continues to  tolerate her diet with no further nausea and vomiting.  Plain films showed  the small bowel  obstruction has resolved.  No further surgical intervention  or evaluation was required.   Problem 2 - INFECTIOUS DISEASE:  The patient did have some low grade fever.  White count remained normal.  There was no definite source of infection.  She was not treated with antibiotics and her temperature did normalize.   Problem 3 - HYPERTENSION:  The patient has had some intermittent elevated  systolic blood pressures in the 150s-160s.  Overall, her systolic blood  pressure was around 130.  We did not initiate any antihypertensives, but  will defer to this her primary care physician.   Problem 4 - OSTEOARTHRITIS:  The patient does take Naprosyn daily for  osteoarthritis pain.  We have also started her on a pro time pump inhibitor  for a 30-day trial.   DISCHARGE LABORATORIES:  Hemoglobin 10.9.  Urine culture was negative.  BUN  5, creatinine 0.8.  Occult blood was negative.  Sedimentation rate 46.  TSH  1.078.  Amylase, lipase were normal.  LFTs were normal.   DISCHARGE MEDICATIONS:  1. Naprosyn as at home.  2. Protonix 40 mg daily.   FOLLOW UP:  With Wanda Plump, MD [facility] in about a month.     Cornell Barman, P.A. [facility]                  Rene Paci, M.D. [facility]    LC/MEDQ  D:  03/22/2003  T:  03/22/2003  Job:  [id number]   cc:   Wanda Plump, MD [facility]  [phone] [place], [place] [address]   [name]. [name], M.D.  [address]  [place]  [place] [address]  Fax: [phone]

## 2011-03-21 NOTE — Assessment & Plan Note (Signed)
[facility]                                   ON-CALL NOTE   Theresa, Taylor                     MRN:          [id number]  DATE:06/13/2006                            DOB:          1930-03-12    PRIMARY CARE PHYSICIAN:  Dr. Drue Novel.   TIME OF PHONE CALL:  At 10:10 a.m.  Phone number [phone].   HISTORY OF PRESENT ILLNESS:  She has had dizziness, which has not really  been clear cut to her.  She tried increasing her fluids.  She is diabetic,  but her sugars were fine.  She is not on any medications; has not had fever  or shortness of breath; has had some increased urinary frequency but no  pain.   PLAN:  She is going to come at 12:15 for further evaluation.                                   Karie Schwalbe, MD   RIL/MedQ  DD:  06/13/2006  DT:  06/14/2006  Job #:  [id number]   cc:   Theresa Ora, MD

## 2011-03-21 NOTE — Consult Note (Signed)
NAME:  [name], [name]                        ACCOUNT NO.:  [id number]   MEDICAL RECORD NO.:  [id number]                   PATIENT TYPE:  INP   LOCATION:  1824                                 FACILITY:  [facility]   PHYSICIAN:  [name]. [name], M.D.             DATE OF BIRTH:  [birth year]   DATE OF CONSULTATION:  03/18/2003  DATE OF DISCHARGE:                                   CONSULTATION   SURGICAL CONSULTATION   CHIEF COMPLAINT:  Abdominal pain and vomiting.   HISTORY OF PRESENT ILLNESS:  This is a 77 year old white female who was in  her usual state of health until yesterday.  In the early afternoon, she  developed crampy mid and lower abdominal pain, and then developed repeats  bouts of vomiting.  She normally has 2 or 3 solid bowel movements per day,  and she had some bowel movements yesterday, but she had diarrhea without  blood.  She has not vomited the past several hours, but because of continued  discomfort and nausea she came to the emergency room.  She was evaluated by  Dr. Alroy Dust, and x-ray suggested a small-bowel obstruction.  She had an  NG tube placed in the emergency room and is being admitted.  I was asked to  see her in consultation.   PAST MEDICAL HISTORY:  She had total abdominal hysterectomy through a lower  midline incision in 1960.  She was hospitalized in [place], [place] about  ten years ago with what sounds like a bowel obstruction, had a nasogastric  tube for about one week, and then resolved.  She has had hemorrhoidectomy.  She has had no further surgery.  She has arthritis.  She denies any history  of coronary artery disease, diabetes, cancer, or asthma or bronchitis.   CURRENT MEDICATIONS:  Naproxen for her arthritis.   REVIEW OF SYSTEMS:  She denies any weight gain or weight loss, denies fever  or chills.  GI:  Normally has 2 or 3 solid bowel movements per day.  The  crampy abdominal pain, nausea and vomiting is a very recent acute problem,  and it has not been a chronic problem.  She denies any history of cancer or  blood in the stool.   PHYSICAL EXAM:  GENERAL:  A pleasant older black female with a nasogastric  tube in place.  She is in no acute distress, but has received some pain  medicine.  VITAL SIGNS:  Temp 97.7, heart rate 80, blood pressure 123/53, oxygen  saturation 96% on room air.  HEENT:  Sclerae clear, extraocular movements intact.  Oropharynx is clear.  NECK:  Supple, nontender, no mass, no crepitance, no tenderness, no bruit,  no thyroid mass.  LUNGS:  Clear to auscultation.  HEART:  Regular rate and rhythm, no murmur, heart sound in the left chest.  ABDOMEN:  Soft, possibly slightly distended, with hypoactive bowel sounds.  She has a  well-healed lower midline scar, no hernia there.  She really is  not tender.  There is no abdominal mass.  Liver and spleen are no enlarged.   ADMISSION DATA:  Hemoglobin 12.1, white count 10,000.  Basic metabolic panel  normal.  PT and PTT are normal.  Abdominal x-ray shows an early partial  small-bowel obstruction, with a few dilated loops of small bowel and air in  the colon.  There is no free air.   IMPRESSION:  1. Partial small-bowel obstruction.  I suspect that she had adhesions.     There is no clinical evidence of compromised bowel at this time.  2. Status post total abdominal hysterectomy.  3. Question of remote history of small-bowel obstruction, treated     conservatively.   PLAN:  The patient will be admitted for IV fluid hydration, nasogastric  suction and bowel rest.   We will repeat her lab work and abdominal x-rays tomorrow morning.                                               [name]. [name], M.D.    HMI/MEDQ  D:  03/18/2003  T:  03/18/2003  Job:  [id number]   cc:   Lonzo Cloud. Kriste Basque, M.D. [facility]

## 2011-04-15 ENCOUNTER — Encounter: Payer: Self-pay | Admitting: Internal Medicine

## 2011-04-16 ENCOUNTER — Encounter: Payer: Self-pay | Admitting: Internal Medicine

## 2011-04-16 ENCOUNTER — Ambulatory Visit (INDEPENDENT_AMBULATORY_CARE_PROVIDER_SITE_OTHER): Payer: Medicare Other | Admitting: Internal Medicine

## 2011-04-16 DIAGNOSIS — M1990 Unspecified osteoarthritis, unspecified site: Secondary | ICD-10-CM

## 2011-04-16 DIAGNOSIS — M2550 Pain in unspecified joint: Secondary | ICD-10-CM

## 2011-04-16 DIAGNOSIS — E119 Type 2 diabetes mellitus without complications: Secondary | ICD-10-CM

## 2011-04-16 LAB — BASIC METABOLIC PANEL
Calcium: 9.3 mg/dL (ref 8.4–10.5)
GFR: 103.91 mL/min (ref >60.00)
Potassium: 4.5 mEq/L (ref 3.5–5.1)
Sodium: 141 mEq/L (ref 135–145)

## 2011-04-16 LAB — HEMOGLOBIN A1C: Hgb A1c MFr Bld: 6.4 % (ref 4.6–6.5)

## 2011-04-16 MED ORDER — CELECOXIB 100 MG PO CAPS: 100.0000 mg | ORAL_CAPSULE | Freq: Two times a day (BID) | ORAL | Status: DC | PRN

## 2011-04-16 NOTE — Assessment & Plan Note (Signed)
On diet only, labs 

## 2011-04-16 NOTE — Progress Notes (Signed)
  Subjective:    Patient ID: [name], female    DOB: 1930-08-17, 77 y.o.   MRN: [id number]  HPI Routine office visit Her main complaint today is pain, "I know it's OA". The pain is in the neck and hands. She also still has pain at the knee that was replaced a few months ago.  Past Medical History  Diagnosis Date  . Diabetes mellitus   . GERD (gastroesophageal reflux disease)   . Osteopenia   . Abnormal breast exam 2004    MMG neg (eval by surgeon neg)  . Hx SBO 2004 & 7/09  . Iritis 2005  . Osteoarthritis      Review of Systems Patient has diabetes, on diet control, ambulatory blood sugars 101-99.     Objective:   Physical Exam  Constitutional: She is oriented to person, place, and time. She appears well-developed and well-nourished.  HENT:  Head: Normocephalic and atraumatic.  Cardiovascular: Normal rate, regular rhythm and normal heart sounds.   No murmur heard. Pulmonary/Chest: Effort normal and breath sounds normal. No respiratory distress. She has no wheezes. She has no rales.  Musculoskeletal: She exhibits no edema.  Neurological: She is alert and oriented to person, place, and time.  Psychiatric: She has a normal mood and affect. Her behavior is normal. Judgment and thought content normal.          Assessment & Plan:

## 2011-04-16 NOTE — Assessment & Plan Note (Addendum)
Cont w/ DJD sx In the past used celebrex , it helped some but ortho switched to ultram-meloxicam. Does not like to take oxycodone , vicodin is "too strong", Tylenol usually does not help. She was Rx ultram before , does not recall how she did on it We agreed to restart celebrex, will do a low dose , See instructions , aware of potential GI side effects

## 2011-05-20 ENCOUNTER — Other Ambulatory Visit: Payer: Self-pay | Admitting: Internal Medicine

## 2011-05-20 DIAGNOSIS — Z1231 Encounter for screening mammogram for malignant neoplasm of breast: Secondary | ICD-10-CM

## 2011-05-23 ENCOUNTER — Ambulatory Visit
Admission: RE | Admit: 2011-05-23 | Discharge: 2011-05-23 | Disposition: A | Discharge status: Home / Self Care | Payer: Medicare Other | Source: Ambulatory Visit | Attending: Internal Medicine | Admitting: Internal Medicine

## 2011-05-23 ENCOUNTER — Ambulatory Visit: Payer: Medicare Other

## 2011-05-23 DIAGNOSIS — Z1231 Encounter for screening mammogram for malignant neoplasm of breast: Secondary | ICD-10-CM

## 2011-05-26 ENCOUNTER — Telehealth: Payer: Self-pay | Admitting: *Deleted

## 2011-05-26 NOTE — Telephone Encounter (Signed)
Pt is unable to afford Celebrex, please advise if there is a cheaper alternative?

## 2011-05-27 MED ORDER — MELOXICAM 7.5 MG PO TABS: ORAL_TABLET | ORAL | Status: DC

## 2011-05-27 NOTE — Telephone Encounter (Signed)
She can use  meloxicam 7.5 mg 1 or 2 tabs qd, watch for stomach s/e (pain, nausea), #60, 1 RF. This med was Rx by ortho before , we can re-try and see if it works

## 2011-05-27 NOTE — Telephone Encounter (Signed)
Message left for patient to return my call.  

## 2011-05-28 NOTE — Telephone Encounter (Signed)
Message left for patient to return my call.  

## 2011-05-29 NOTE — Telephone Encounter (Signed)
Message left for patient to return my call.  

## 2011-06-24 ENCOUNTER — Emergency Department (HOSPITAL_COMMUNITY): Payer: Medicare Other

## 2011-06-24 ENCOUNTER — Emergency Department (HOSPITAL_COMMUNITY)
Admission: EM | Admit: 2011-06-24 | Discharge: 2011-06-25 | Disposition: A | Discharge status: Home / Self Care | Payer: Medicare Other | Attending: Emergency Medicine | Admitting: Emergency Medicine

## 2011-06-24 DIAGNOSIS — N390 Urinary tract infection, site not specified: Secondary | ICD-10-CM | POA: Insufficient documentation

## 2011-06-24 DIAGNOSIS — R296 Repeated falls: Secondary | ICD-10-CM | POA: Insufficient documentation

## 2011-06-24 DIAGNOSIS — E119 Type 2 diabetes mellitus without complications: Secondary | ICD-10-CM | POA: Insufficient documentation

## 2011-06-24 DIAGNOSIS — E7800 Pure hypercholesterolemia, unspecified: Secondary | ICD-10-CM | POA: Insufficient documentation

## 2011-06-24 DIAGNOSIS — R42 Dizziness and giddiness: Secondary | ICD-10-CM | POA: Insufficient documentation

## 2011-06-24 DIAGNOSIS — IMO0002 Reserved for concepts with insufficient information to code with codable children: Secondary | ICD-10-CM | POA: Insufficient documentation

## 2011-06-24 DIAGNOSIS — Z79899 Other long term (current) drug therapy: Secondary | ICD-10-CM | POA: Insufficient documentation

## 2011-06-24 DIAGNOSIS — R55 Syncope and collapse: Secondary | ICD-10-CM | POA: Insufficient documentation

## 2011-06-24 LAB — POCT I-STAT, CHEM 8
Chloride: 105 mEq/L (ref 96–112)
Glucose, Bld: 104 mg/dL — ABNORMAL HIGH (ref 70–99)
HCT: 39 % (ref 36.0–46.0)
Potassium: 4.2 mEq/L (ref 3.5–5.1)

## 2011-06-24 LAB — DIFFERENTIAL
Basophils Absolute: 0 10*3/uL (ref 0.0–0.1)
Basophils Relative: 0 % (ref 0–1)
Monocytes Relative: 7 % (ref 3–12)
Neutro Abs: 3.4 10*3/uL (ref 1.7–7.7)
Neutrophils Relative %: 44 % (ref 43–77)

## 2011-06-24 LAB — POCT I-STAT TROPONIN I

## 2011-06-24 LAB — URINE MICROSCOPIC-ADD ON

## 2011-06-24 LAB — URINALYSIS, ROUTINE W REFLEX MICROSCOPIC
Glucose, UA: NEGATIVE mg/dL
Nitrite: NEGATIVE
Specific Gravity, Urine: 1.009 (ref 1.005–1.030)
pH: 7 (ref 5.0–8.0)

## 2011-06-24 LAB — CBC
Hemoglobin: 12.1 g/dL (ref 12.0–15.0)
MCH: 27.6 pg (ref 26.0–34.0)
RBC: 4.39 MIL/uL (ref 3.87–5.11)
WBC: 7.7 10*3/uL (ref 4.0–10.5)

## 2011-06-25 LAB — URINE CULTURE

## 2011-06-27 ENCOUNTER — Ambulatory Visit (INDEPENDENT_AMBULATORY_CARE_PROVIDER_SITE_OTHER): Payer: Medicare Other | Admitting: Internal Medicine

## 2011-06-27 ENCOUNTER — Encounter: Payer: Self-pay | Admitting: Internal Medicine

## 2011-06-27 DIAGNOSIS — R55 Syncope and collapse: Secondary | ICD-10-CM

## 2011-06-27 DIAGNOSIS — Z136 Encounter for screening for cardiovascular disorders: Secondary | ICD-10-CM

## 2011-06-27 NOTE — Progress Notes (Signed)
  Subjective:    Patient ID: [name], female    DOB: 26-Feb-1930, 77 y.o.   MRN: [id number]  HPI [name] to the ER 06-25-11 with a syncopal spell. Neurological exam was normal. Workup included a BMP, CBC, troponin, chest x-ray, CT of the head --->  all normal. Urinalysis shows some leukocytes, eventually urine culture came back negative. She is here for followup.  Past Medical History  Diagnosis Date  . Diabetes mellitus   . GERD (gastroesophageal reflux disease)   . Osteopenia   . Abnormal breast exam 2004    MMG neg (eval by surgeon neg)  . Hx SBO 2004 & 7/09  . Iritis 2005  . Osteoarthritis    Past Surgical History  Procedure Date  . Abdominal hysterectomy     in the 70s, for bleeding, no cancer  . Bilateral oophorectomy     per pt  . Total knee arthroplasty 12/11    right     Review of Systems Patient describes the syncope as follows: She was sating at her house, she quickly bend and twist her head trying to reach for something and as she was doing that she realizes that she was passing out. Episode was unwitnessed it. She thinks she was unconscious for a few seconds, she got herself up, and called  for help. She was able to articulate words without problems. In the period immediately before or immediately after this episode, she did not have chest pain, shortness of breath, palpitations. No diplopia, slurred speech or motor deficits. She felt slightly funny around her mouth but it was not any lateralized symptom. Does not recall any tongue bite, bladder or bowel incontinence. No recent dysuria, gross hematuria, fever or chills. Since that episode, she is feeling well.    Objective:   Physical Exam  Constitutional: She is oriented to person, place, and time. She appears well-developed and well-nourished. No distress.  HENT:  Head: Normocephalic and atraumatic.  Eyes: EOM are normal. Pupils are equal, round, and reactive to light.  Neck: No thyromegaly present.         Normal and symmetric carotid pulses, no bruit  Cardiovascular: Normal rate, regular rhythm and normal heart sounds.   No murmur heard. Pulmonary/Chest: No respiratory distress. She has no wheezes. She has no rales.  Abdominal: Soft. Bowel sounds are normal. She exhibits no distension. There is no tenderness. There is no rebound and no guarding.  Neurological: She is alert and oriented to person, place, and time.       Speech fluent, gait and motor intact. Face is symmetric  Skin: Skin is warm and dry. She is not diaphoretic.  Psychiatric: She has a normal mood and affect. Her behavior is normal. Judgment and thought content normal.          Assessment & Plan:

## 2011-06-27 NOTE — Assessment & Plan Note (Addendum)
77 year old lady with diabetes, high cholesterol who recently had a syncope. Workup by the ER was negative, 6-lead EKG by EMS showed no acute changes EKG today wnl, no acute changes  Chart is reviewed: 01-2009 normal carotid Dopplers. 09-1999 and had an echocardiogram which was essentially normal. Symptoms may have been vaso -vagal as she quickly turned and moved her head while trying to reach something. On the other hand, she does have several CV RF and it would be reasonable to do a stress test on her to r/o CAD. She agreed , will shedule RTC 2 months Call if any problems , CP, dizzines , etc

## 2011-06-27 NOTE — Patient Instructions (Signed)
Start Bactrim, your urine culture came back negative.

## 2011-07-05 ENCOUNTER — Inpatient Hospital Stay (HOSPITAL_COMMUNITY)
Admission: EM | Admit: 2011-07-05 | Discharge: 2011-07-24 | DRG: 336 | Disposition: A | Discharge status: Home / Self Care | Payer: Medicare Other | Attending: General Surgery | Admitting: General Surgery

## 2011-07-05 ENCOUNTER — Emergency Department (HOSPITAL_COMMUNITY): Payer: Medicare Other

## 2011-07-05 ENCOUNTER — Encounter (HOSPITAL_COMMUNITY): Payer: Self-pay | Admitting: Radiology

## 2011-07-05 DIAGNOSIS — E785 Hyperlipidemia, unspecified: Secondary | ICD-10-CM | POA: Diagnosis present

## 2011-07-05 DIAGNOSIS — Z7982 Long term (current) use of aspirin: Secondary | ICD-10-CM

## 2011-07-05 DIAGNOSIS — K929 Disease of digestive system, unspecified: Secondary | ICD-10-CM | POA: Diagnosis not present

## 2011-07-05 DIAGNOSIS — R5082 Postprocedural fever: Secondary | ICD-10-CM | POA: Diagnosis not present

## 2011-07-05 DIAGNOSIS — M1990 Unspecified osteoarthritis, unspecified site: Secondary | ICD-10-CM | POA: Diagnosis present

## 2011-07-05 DIAGNOSIS — K56609 Unspecified intestinal obstruction, unspecified as to partial versus complete obstruction: Secondary | ICD-10-CM

## 2011-07-05 DIAGNOSIS — E46 Unspecified protein-calorie malnutrition: Secondary | ICD-10-CM | POA: Diagnosis not present

## 2011-07-05 DIAGNOSIS — K560 Paralytic ileus: Secondary | ICD-10-CM | POA: Diagnosis not present

## 2011-07-05 DIAGNOSIS — E669 Obesity, unspecified: Secondary | ICD-10-CM | POA: Diagnosis present

## 2011-07-05 DIAGNOSIS — K660 Peritoneal adhesions (postprocedural) (postinfection): Secondary | ICD-10-CM | POA: Diagnosis present

## 2011-07-05 DIAGNOSIS — E119 Type 2 diabetes mellitus without complications: Secondary | ICD-10-CM | POA: Diagnosis present

## 2011-07-05 DIAGNOSIS — Z6833 Body mass index (BMI) 33.0-33.9, adult: Secondary | ICD-10-CM

## 2011-07-05 DIAGNOSIS — Z96659 Presence of unspecified artificial knee joint: Secondary | ICD-10-CM

## 2011-07-05 HISTORY — PX: EXPLORATORY LAPAROTOMY: SUR591

## 2011-07-05 LAB — COMPREHENSIVE METABOLIC PANEL
ALT: 11 U/L (ref 0–35)
AST: 17 U/L (ref 0–37)
CO2: 31 mEq/L (ref 19–32)
Calcium: 9.9 mg/dL (ref 8.4–10.5)
Chloride: 98 mEq/L (ref 96–112)
Creatinine, Ser: 0.73 mg/dL (ref 0.50–1.10)
GFR calc Af Amer: >60 mL/min (ref >60)
GFR calc non Af Amer: >60 mL/min (ref >60)
Glucose, Bld: 132 mg/dL — ABNORMAL HIGH (ref 70–99)
Sodium: 137 mEq/L (ref 135–145)
Total Bilirubin: 0.6 mg/dL (ref 0.3–1.2)

## 2011-07-05 LAB — DIFFERENTIAL
Lymphs Abs: 3.4 10*3/uL (ref 0.7–4.0)
Monocytes Relative: 5 % (ref 3–12)
Neutro Abs: 7.4 10*3/uL (ref 1.7–7.7)
Neutrophils Relative %: 65 % (ref 43–77)

## 2011-07-05 LAB — CBC
Hemoglobin: 12.8 g/dL (ref 12.0–15.0)
MCH: 27.7 pg (ref 26.0–34.0)
MCV: 81.4 fL (ref 78.0–100.0)
Platelets: 212 10*3/uL (ref 150–400)
RBC: 4.62 MIL/uL (ref 3.87–5.11)
WBC: 11.4 10*3/uL — ABNORMAL HIGH (ref 4.0–10.5)

## 2011-07-05 MED ORDER — IOHEXOL 300 MG/ML  SOLN
100.0000 mL | Freq: Once | INTRAMUSCULAR | Status: AC | PRN
  Administered 2011-07-05: 100 mL via INTRAVENOUS

## 2011-07-06 ENCOUNTER — Inpatient Hospital Stay (HOSPITAL_COMMUNITY): Payer: Medicare Other

## 2011-07-06 LAB — COMPREHENSIVE METABOLIC PANEL
ALT: 8 U/L (ref 0–35)
Alkaline Phosphatase: 48 U/L (ref 39–117)
BUN: 13 mg/dL (ref 6–23)
CO2: 30 mEq/L (ref 19–32)
Chloride: 102 mEq/L (ref 96–112)
GFR calc Af Amer: >60 mL/min (ref >60)
GFR calc non Af Amer: >60 mL/min (ref >60)
Glucose, Bld: 163 mg/dL — ABNORMAL HIGH (ref 70–99)
Potassium: 3.8 mEq/L (ref 3.5–5.1)
Sodium: 138 mEq/L (ref 135–145)
Total Bilirubin: 0.5 mg/dL (ref 0.3–1.2)
Total Protein: 6.5 g/dL (ref 6.0–8.3)

## 2011-07-06 LAB — GLUCOSE, CAPILLARY
Glucose-Capillary: 131 mg/dL — ABNORMAL HIGH (ref 70–99)
Glucose-Capillary: 107 mg/dL — ABNORMAL HIGH (ref 70–99)

## 2011-07-06 LAB — CBC
HCT: 33.6 % — ABNORMAL LOW (ref 36.0–46.0)
Hemoglobin: 11.1 g/dL — ABNORMAL LOW (ref 12.0–15.0)
MCHC: 33 g/dL (ref 30.0–36.0)
RBC: 4.07 MIL/uL (ref 3.87–5.11)
WBC: 8.9 10*3/uL (ref 4.0–10.5)

## 2011-07-07 ENCOUNTER — Inpatient Hospital Stay (HOSPITAL_COMMUNITY): Payer: Medicare Other

## 2011-07-07 DIAGNOSIS — K565 Intestinal adhesions [bands], unspecified as to partial versus complete obstruction: Secondary | ICD-10-CM

## 2011-07-07 LAB — TYPE AND SCREEN: Antibody Screen: NEGATIVE

## 2011-07-07 LAB — GLUCOSE, CAPILLARY
Glucose-Capillary: 112 mg/dL — ABNORMAL HIGH (ref 70–99)
Glucose-Capillary: 158 mg/dL — ABNORMAL HIGH (ref 70–99)
Glucose-Capillary: 158 mg/dL — ABNORMAL HIGH (ref 70–99)

## 2011-07-07 NOTE — Consult Note (Signed)
[name], Theresa NO.:  [id number]  MEDICAL RECORD NO.:  [id number]  LOCATION:  [facility]                         FACILITY:  [facility]  PHYSICIAN:  Maisie Fus A. Wiktoria Taylor, M.D.DATE OF BIRTH:  May 25, 1930  DATE OF CONSULTATION:  07/05/2011 DATE OF DISCHARGE:                                CONSULTATION   REQUESTING PHYSICIAN:  Hassan Buckler. Caporossi, MD  REASON FOR CONSULTATION:  Bowel obstruction.  HISTORY OF PRESENT ILLNESS:  The patient is a pleasant 77 year old female with a 1-day history of diffuse abdominal pain.  The pain started yesterday.  It was crampy in nature and began to progress in to today. She had a bowel movement earlier today.  She has also had significant amount of nausea and vomiting.  There has been no blood in her emesis. Her bowel movements have been nonbloody.  She feels distended and full with a pain level of 8/10.  The pain is diffuse around her umbilicus and her abdominal cavity.  CT was obtained which showed a mid-to-distal small bowel obstruction.  I was asked to see her at the request of Dr. Weldon Inches.  She denies any fever or chills.  She has had 2 previous partial small bowel obstructions similar to this back in 2004 and 2009, managed nonoperatively.  PAST MEDICAL HISTORY: 1. Diabetes mellitus type 2. 2. Obesity. 3. Osteoarthritis with knee replacement surgery in the past. 4. Hyperlipidemia. 5. Small bowel obstruction as outlined above.  PAST SURGICAL HISTORY:  Hysterectomy and right knee replacement surgery.  FAMILY HISTORY:  Noncontributory.  SOCIAL HISTORY:  Denies tobacco or alcohol use.  She is widowed.  REVIEW OF SYSTEMS:  Positive for abdominal pain, nausea, vomiting, otherwise negative x15 points.  MEDICATIONS AT HOME:  Metformin, simvastatin, and aspirin; doses unknown.  ALLERGIES:  No known drug allergies.  PHYSICAL EXAMINATION:  GENERAL APPEARANCE:  A pleasant female in no apparent distress. VITAL SIGNS:   Temperature is 99, pulse 78, blood pressure 135/71, respiratory rate 20, and saturation 100%. HEENT:  No jaundice.  Oropharynx is moist. NECK:  Supple, nontender.  Trachea midline.  No masses. CARDIOVASCULAR:  Regular rate and rhythm without rub, murmur, or gallop. EXTREMITIES:  Warm and well perfused.  No clubbing, cyanosis, nor edema. Scars noted from previous orthopedic procedure. PULMONARY:  Lung sounds are clear.  Chest wall motion is normal.  Work of breathing normal. ABDOMEN:  Distended and tender, especially around the periumbilical region without obvious peritonitis.  No rebound.  No guarding.  It is quiet.  Lower midline scar without hernia noted. NEUROLOGIC:  Glasgow coma scale is 15.  Motor and sensory functions are grossly intact.  DIAGNOSTIC STUDIES:  Abdominal pelvic CT scan was reviewed which shows a mid-to-distal small bowel obstruction with a transition point in the anterior abdominal wall.  There is no free air and minimal free fluid around the liver.  There are no signs of ischemia or swirling of the mesentery.  She has a white count of 11,400 without left shift, hemoglobin is 12.8, and platelet count is 212,000.  Sodium 137, potassium 4.4, chloride 98, CO2 of 31, BUN 14, creatinine 0.73, and glucose 132.  Albumin is 4, lipase is 29.  IMPRESSION: 1. Small bowel obstruction versus partial small-bowel obstruction,     recurrent. 2. Diabetes mellitus type 2. 3. Obesity.  PLAN:  Admit for IV fluids, NG tube for decompression, and n.p.o. status for now.  We will go ahead and put her on sliding scale insulin.  We will place her on PAS and start in the morning Lovenox 40 mg subcu daily.  We will repeat her laboratory work in the morning as well as her flat plate to see if this obstruction will resolve as her previous ones have with conservative management.  I have explained this to the patient.  They understand the above and agreed to proceed.     [name] A.  Banks Chaikin, M.D.     TAC/MEDQ  D:  07/05/2011  T:  07/06/2011  Job:  [id number]  cc:   Willow Ora, MD  Electronically Signed by Harriette Bouillon M.D. on 07/07/2011 11:01:58 AM

## 2011-07-08 LAB — GLUCOSE, CAPILLARY
Glucose-Capillary: 129 mg/dL — ABNORMAL HIGH (ref 70–99)
Glucose-Capillary: 131 mg/dL — ABNORMAL HIGH (ref 70–99)
Glucose-Capillary: 141 mg/dL — ABNORMAL HIGH (ref 70–99)
Glucose-Capillary: 159 mg/dL — ABNORMAL HIGH (ref 70–99)

## 2011-07-08 LAB — CBC
MCV: 84.5 fL (ref 78.0–100.0)
Platelets: 177 10*3/uL (ref 150–400)
RBC: 3.87 MIL/uL (ref 3.87–5.11)
RDW: 13.5 % (ref 11.5–15.5)
WBC: 9.3 10*3/uL (ref 4.0–10.5)

## 2011-07-08 LAB — COMPREHENSIVE METABOLIC PANEL
ALT: 13 U/L (ref 0–35)
AST: 16 U/L (ref 0–37)
Albumin: 2.8 g/dL — ABNORMAL LOW (ref 3.5–5.2)
Chloride: 110 mEq/L (ref 96–112)
Creatinine, Ser: 0.7 mg/dL (ref 0.50–1.10)
Potassium: 4.4 mEq/L (ref 3.5–5.1)
Sodium: 145 mEq/L (ref 135–145)
Total Bilirubin: 0.8 mg/dL (ref 0.3–1.2)

## 2011-07-09 LAB — GLUCOSE, CAPILLARY
Glucose-Capillary: 151 mg/dL — ABNORMAL HIGH (ref 70–99)
Glucose-Capillary: 144 mg/dL — ABNORMAL HIGH (ref 70–99)
Glucose-Capillary: 138 mg/dL — ABNORMAL HIGH (ref 70–99)

## 2011-07-10 ENCOUNTER — Encounter (HOSPITAL_COMMUNITY): Payer: Medicare Other | Admitting: Radiology

## 2011-07-10 ENCOUNTER — Inpatient Hospital Stay (HOSPITAL_COMMUNITY): Payer: Medicare Other

## 2011-07-10 DIAGNOSIS — E876 Hypokalemia: Secondary | ICD-10-CM

## 2011-07-10 LAB — CBC
HCT: 29.6 % — ABNORMAL LOW (ref 36.0–46.0)
Hemoglobin: 9.8 g/dL — ABNORMAL LOW (ref 12.0–15.0)
MCV: 82.5 fL (ref 78.0–100.0)
RBC: 3.59 MIL/uL — ABNORMAL LOW (ref 3.87–5.11)
WBC: 9.2 10*3/uL (ref 4.0–10.5)

## 2011-07-10 LAB — COMPREHENSIVE METABOLIC PANEL
ALT: 11 U/L (ref 0–35)
Alkaline Phosphatase: 50 U/L (ref 39–117)
CO2: 28 mEq/L (ref 19–32)
Chloride: 108 mEq/L (ref 96–112)
GFR calc Af Amer: >60 mL/min (ref >60)
GFR calc non Af Amer: >60 mL/min (ref >60)
Glucose, Bld: 133 mg/dL — ABNORMAL HIGH (ref 70–99)
Potassium: 2.9 mEq/L — ABNORMAL LOW (ref 3.5–5.1)
Sodium: 144 mEq/L (ref 135–145)
Total Bilirubin: 0.6 mg/dL (ref 0.3–1.2)
Total Protein: 6.2 g/dL (ref 6.0–8.3)

## 2011-07-10 LAB — GLUCOSE, CAPILLARY
Glucose-Capillary: 138 mg/dL — ABNORMAL HIGH (ref 70–99)
Glucose-Capillary: 109 mg/dL — ABNORMAL HIGH (ref 70–99)

## 2011-07-10 NOTE — Op Note (Signed)
[name], [name] NO.:  [id number]  MEDICAL RECORD NO.:  [id number]  LOCATION:  5155                         FACILITY:  [facility]  PHYSICIAN:  Maisie Fus A. Ziaire Hagos, M.D.DATE OF BIRTH:  09/06/1930  DATE OF PROCEDURE:  07/07/2011 DATE OF DISCHARGE:                              OPERATIVE REPORT   PREOPERATIVE DIAGNOSIS:  Small bowel obstruction.  POSTOPERATIVE DIAGNOSIS:  Mid small-bowel obstruction.  PROCEDURE:  Exploratory laparotomy with lysis of adhesions.  SURGEON:  Maisie Fus A. Kierre Deines, MD.  ANESTHESIA:  General endotracheal esthesia.  ESTIMATED BLOOD LOSS:  100 mL.  ASSISTANT:  Currie Paris, MD.  SPECIMENS:  None.  INDICATIONS FOR PROCEDURE:  The patient is a pleasant 77 year old female with history of partial small-bowel obstruction.  She is admitted on Friday with small bowel obstruction.  After 48 hours of NG management, she had not improved and actually was worsening.  We recommended exploration at this point in time since clinically she had not improved. Risks, benefits, and alternative therapies were discussed with the patient by Dr. Cyndia Bent.  Risk of bleeding, infection, bowel injury, abscess formation, intracutaneous fistula, injury to the colon, wound problems, wound dehiscence, wound infection, hernia formation, and recurrence of the problem were discussed.  Other options would be continued with medical management.  After discussion, the patient felt that surgery is her best option and agreed to proceed.  DESCRIPTION OF PROCEDURE:  The patient was brought to the operating room after being seen in the holding area.  Questions were answered.  After induction of general anesthesia, the abdomen was prepped and draped in sterile fashion.  Midline incision was used after time-out was done. She received 2 grams of cefoxitin.  We entered the abdominal cavity without difficulty.  There are adhesions in the anterior abdominal wall. We  took it all the way down just above the pubis.  We then mobilized the omentum of pelvis and it was stuck.  At this point, we dilated the small bowel and this was quite mid jejunum.  We followed it down and there are numerous adhesive bands and there was significant amount of twist and turns to the bowel.  We lysed all these all the way down into the pelvis until the entire small bowel from the ligament of Treitz to the ileocecal valve was correctly mobile and free.  We examined the bowel and saw no evidence of injury from that.  It was quite that she had a partial high-grade obstruction in the mid jejunum and this was transition from dilated bowel to nondilated bowel.  We examined the rectum and sigmoid colon since we mobile some bowel in the pelvis without evidence of any injury.  Irrigation was used and suctioned out. The ascending, transverse, and descending colon were otherwise normal. At this point in time after running the small bowel and finding no evidence of injury, we closed the fascia with #1 Novofil.  There was some suggestion around her umbilicus, she had some mesh from hernia repair that looked like, but it was difficult to tell, but it appeared to be like a small patch on mesh.  This appeared well incorporated. After closure with #1 Novofil, we closed  the skin with staples.  Dry dressings were applied.  All final counts of sponge, needle, and instruments were found to be correct at this portion of case.  The patient was awokened, extubated, and taken to recovery in satisfactory condition.     Azani Brogdon A. Kerim Statzer, M.D.     TAC/MEDQ  D:  07/07/2011  T:  07/07/2011  Job:  [id number]  cc:   Willow Ora, MD  Electronically Signed by Harriette Bouillon M.D. on 07/10/2011 07:13:17 AM

## 2011-07-11 LAB — BASIC METABOLIC PANEL
Chloride: 107 mEq/L (ref 96–112)
Creatinine, Ser: 0.51 mg/dL (ref 0.50–1.10)
GFR calc Af Amer: >60 mL/min (ref >60)
Sodium: 144 mEq/L (ref 135–145)

## 2011-07-11 LAB — GLUCOSE, CAPILLARY
Glucose-Capillary: 116 mg/dL — ABNORMAL HIGH (ref 70–99)
Glucose-Capillary: 113 mg/dL — ABNORMAL HIGH (ref 70–99)

## 2011-07-12 DIAGNOSIS — E46 Unspecified protein-calorie malnutrition: Secondary | ICD-10-CM

## 2011-07-12 LAB — CBC
HCT: 30.6 % — ABNORMAL LOW (ref 36.0–46.0)
MCHC: 33 g/dL (ref 30.0–36.0)
MCV: 81.4 fL (ref 78.0–100.0)
Platelets: 267 10*3/uL (ref 150–400)
RBC: 3.76 MIL/uL — ABNORMAL LOW (ref 3.87–5.11)
RDW: 13 % (ref 11.5–15.5)
WBC: 9.8 10*3/uL (ref 4.0–10.5)

## 2011-07-12 LAB — COMPREHENSIVE METABOLIC PANEL
ALT: 11 U/L (ref 0–35)
AST: 16 U/L (ref 0–37)
Albumin: 2.7 g/dL — ABNORMAL LOW (ref 3.5–5.2)
Alkaline Phosphatase: 46 U/L (ref 39–117)
Calcium: 8.9 mg/dL (ref 8.4–10.5)
GFR calc Af Amer: >60 mL/min (ref >60)
Potassium: 3.6 mEq/L (ref 3.5–5.1)
Sodium: 140 mEq/L (ref 135–145)
Total Protein: 6.5 g/dL (ref 6.0–8.3)

## 2011-07-12 LAB — GLUCOSE, CAPILLARY
Glucose-Capillary: 170 mg/dL — ABNORMAL HIGH (ref 70–99)
Glucose-Capillary: 184 mg/dL — ABNORMAL HIGH (ref 70–99)

## 2011-07-12 LAB — DIFFERENTIAL
Eosinophils Relative: 2 % (ref 0–5)
Lymphocytes Relative: 22 % (ref 12–46)
Lymphs Abs: 2.2 10*3/uL (ref 0.7–4.0)
Neutrophils Relative %: 68 % (ref 43–77)

## 2011-07-12 LAB — PREALBUMIN: Prealbumin: 10 mg/dL — ABNORMAL LOW (ref 17.0–34.0)

## 2011-07-13 ENCOUNTER — Inpatient Hospital Stay (HOSPITAL_COMMUNITY): Payer: Medicare Other

## 2011-07-13 LAB — BASIC METABOLIC PANEL
Chloride: 102 mEq/L (ref 96–112)
Creatinine, Ser: <0.47 mg/dL — ABNORMAL LOW (ref 0.50–1.10)

## 2011-07-13 LAB — GLUCOSE, CAPILLARY: Glucose-Capillary: 152 mg/dL — ABNORMAL HIGH (ref 70–99)

## 2011-07-14 ENCOUNTER — Encounter (HOSPITAL_COMMUNITY): Payer: Medicare Other | Admitting: Radiology

## 2011-07-14 LAB — DIFFERENTIAL
Eosinophils Absolute: 0.3 10*3/uL (ref 0.0–0.7)
Eosinophils Relative: 3 % (ref 0–5)
Lymphs Abs: 2 10*3/uL (ref 0.7–4.0)
Monocytes Absolute: 1 10*3/uL (ref 0.1–1.0)
Monocytes Relative: 9 % (ref 3–12)

## 2011-07-14 LAB — CBC
MCH: 27 pg (ref 26.0–34.0)
MCHC: 33.1 g/dL (ref 30.0–36.0)
MCV: 81.4 fL (ref 78.0–100.0)
Platelets: 291 10*3/uL (ref 150–400)
RDW: 13.1 % (ref 11.5–15.5)

## 2011-07-14 LAB — GLUCOSE, CAPILLARY
Glucose-Capillary: 174 mg/dL — ABNORMAL HIGH (ref 70–99)
Glucose-Capillary: 175 mg/dL — ABNORMAL HIGH (ref 70–99)
Glucose-Capillary: 160 mg/dL — ABNORMAL HIGH (ref 70–99)

## 2011-07-14 LAB — COMPREHENSIVE METABOLIC PANEL
AST: 36 U/L (ref 0–37)
Albumin: 2.8 g/dL — ABNORMAL LOW (ref 3.5–5.2)
Calcium: 9.1 mg/dL (ref 8.4–10.5)
Creatinine, Ser: <0.47 mg/dL — ABNORMAL LOW (ref 0.50–1.10)

## 2011-07-14 LAB — MAGNESIUM: Magnesium: 2.1 mg/dL (ref 1.5–2.5)

## 2011-07-14 LAB — CHOLESTEROL, TOTAL: Cholesterol: 145 mg/dL (ref 0–200)

## 2011-07-14 LAB — PHOSPHORUS: Phosphorus: 3.8 mg/dL (ref 2.3–4.6)

## 2011-07-14 LAB — PREALBUMIN: Prealbumin: 13.2 mg/dL — ABNORMAL LOW (ref 17.0–34.0)

## 2011-07-15 LAB — GLUCOSE, CAPILLARY
Glucose-Capillary: 134 mg/dL — ABNORMAL HIGH (ref 70–99)
Glucose-Capillary: 178 mg/dL — ABNORMAL HIGH (ref 70–99)

## 2011-07-15 LAB — BASIC METABOLIC PANEL
Calcium: 8.9 mg/dL (ref 8.4–10.5)
GFR calc Af Amer: >60 mL/min (ref >60)
GFR calc non Af Amer: >60 mL/min (ref >60)
Glucose, Bld: 154 mg/dL — ABNORMAL HIGH (ref 70–99)
Sodium: 136 mEq/L (ref 135–145)

## 2011-07-15 LAB — CBC
MCH: 26.7 pg (ref 26.0–34.0)
MCHC: 32.6 g/dL (ref 30.0–36.0)
Platelets: 325 10*3/uL (ref 150–400)
RDW: 13.2 % (ref 11.5–15.5)

## 2011-07-16 LAB — BASIC METABOLIC PANEL
BUN: 19 mg/dL (ref 6–23)
Creatinine, Ser: 0.53 mg/dL (ref 0.50–1.10)
GFR calc Af Amer: >60 mL/min (ref >60)
GFR calc non Af Amer: >60 mL/min (ref >60)
Potassium: 4.2 mEq/L (ref 3.5–5.1)

## 2011-07-16 LAB — GLUCOSE, CAPILLARY
Glucose-Capillary: 132 mg/dL — ABNORMAL HIGH (ref 70–99)
Glucose-Capillary: 148 mg/dL — ABNORMAL HIGH (ref 70–99)
Glucose-Capillary: 154 mg/dL — ABNORMAL HIGH (ref 70–99)
Glucose-Capillary: 170 mg/dL — ABNORMAL HIGH (ref 70–99)
Glucose-Capillary: 173 mg/dL — ABNORMAL HIGH (ref 70–99)

## 2011-07-17 ENCOUNTER — Inpatient Hospital Stay (HOSPITAL_COMMUNITY): Payer: Medicare Other

## 2011-07-17 LAB — CBC
MCH: 27.1 pg (ref 26.0–34.0)
MCHC: 33.1 g/dL (ref 30.0–36.0)
Platelets: 335 10*3/uL (ref 150–400)
RDW: 13.1 % (ref 11.5–15.5)

## 2011-07-17 LAB — COMPREHENSIVE METABOLIC PANEL
ALT: 73 U/L — ABNORMAL HIGH (ref 0–35)
AST: 35 U/L (ref 0–37)
Calcium: 9.2 mg/dL (ref 8.4–10.5)
Sodium: 138 mEq/L (ref 135–145)
Total Protein: 6.4 g/dL (ref 6.0–8.3)

## 2011-07-17 LAB — GLUCOSE, CAPILLARY: Glucose-Capillary: 233 mg/dL — ABNORMAL HIGH (ref 70–99)

## 2011-07-18 ENCOUNTER — Inpatient Hospital Stay (HOSPITAL_COMMUNITY): Payer: Medicare Other

## 2011-07-18 LAB — GLUCOSE, CAPILLARY
Glucose-Capillary: 220 mg/dL — ABNORMAL HIGH (ref 70–99)
Glucose-Capillary: 194 mg/dL — ABNORMAL HIGH (ref 70–99)
Glucose-Capillary: 177 mg/dL — ABNORMAL HIGH (ref 70–99)

## 2011-07-18 MED ORDER — IOHEXOL 300 MG/ML  SOLN
80.0000 mL | Freq: Once | INTRAMUSCULAR | Status: AC | PRN
  Administered 2011-07-18: 80 mL via INTRAVENOUS

## 2011-07-19 ENCOUNTER — Inpatient Hospital Stay (HOSPITAL_COMMUNITY): Payer: Medicare Other

## 2011-07-19 LAB — CBC
HCT: 30.1 % — ABNORMAL LOW (ref 36.0–46.0)
Hemoglobin: 9.8 g/dL — ABNORMAL LOW (ref 12.0–15.0)
MCH: 26.8 pg (ref 26.0–34.0)
MCHC: 32.6 g/dL (ref 30.0–36.0)
MCV: 82.2 fL (ref 78.0–100.0)
Platelets: 377 10*3/uL (ref 150–400)
RBC: 3.66 MIL/uL — ABNORMAL LOW (ref 3.87–5.11)
RDW: 13.2 % (ref 11.5–15.5)
WBC: 10 10*3/uL (ref 4.0–10.5)

## 2011-07-19 LAB — BASIC METABOLIC PANEL
BUN: 18 mg/dL (ref 6–23)
CO2: 31 mEq/L (ref 19–32)
Calcium: 9.4 mg/dL (ref 8.4–10.5)
Chloride: 102 mEq/L (ref 96–112)
Creatinine, Ser: 0.53 mg/dL (ref 0.50–1.10)
GFR calc Af Amer: >60 mL/min (ref >60)
GFR calc non Af Amer: >60 mL/min (ref >60)
Glucose, Bld: 138 mg/dL — ABNORMAL HIGH (ref 70–99)
Potassium: 4.3 mEq/L (ref 3.5–5.1)
Sodium: 138 mEq/L (ref 135–145)

## 2011-07-19 LAB — GLUCOSE, CAPILLARY
Glucose-Capillary: 144 mg/dL — ABNORMAL HIGH (ref 70–99)
Glucose-Capillary: 143 mg/dL — ABNORMAL HIGH (ref 70–99)
Glucose-Capillary: 147 mg/dL — ABNORMAL HIGH (ref 70–99)

## 2011-07-19 LAB — DIFFERENTIAL
Eosinophils Absolute: 0.2 10*3/uL (ref 0.0–0.7)
Lymphocytes Relative: 26 % (ref 12–46)
Lymphs Abs: 2.6 10*3/uL (ref 0.7–4.0)
Monocytes Relative: 6 % (ref 3–12)
Neutrophils Relative %: 66 % (ref 43–77)

## 2011-07-20 ENCOUNTER — Inpatient Hospital Stay (HOSPITAL_COMMUNITY): Payer: Medicare Other

## 2011-07-20 LAB — GLUCOSE, CAPILLARY
Glucose-Capillary: 138 mg/dL — ABNORMAL HIGH (ref 70–99)
Glucose-Capillary: 147 mg/dL — ABNORMAL HIGH (ref 70–99)
Glucose-Capillary: 158 mg/dL — ABNORMAL HIGH (ref 70–99)
Glucose-Capillary: 176 mg/dL — ABNORMAL HIGH (ref 70–99)

## 2011-07-21 ENCOUNTER — Inpatient Hospital Stay (HOSPITAL_COMMUNITY): Payer: Medicare Other

## 2011-07-21 LAB — PREALBUMIN: Prealbumin: 22 mg/dL (ref 17.0–34.0)

## 2011-07-21 LAB — GLUCOSE, CAPILLARY
Glucose-Capillary: 132 mg/dL — ABNORMAL HIGH (ref 70–99)
Glucose-Capillary: 143 mg/dL — ABNORMAL HIGH (ref 70–99)
Glucose-Capillary: 148 mg/dL — ABNORMAL HIGH (ref 70–99)
Glucose-Capillary: 174 mg/dL — ABNORMAL HIGH (ref 70–99)

## 2011-07-21 LAB — COMPREHENSIVE METABOLIC PANEL
Albumin: 3 g/dL — ABNORMAL LOW (ref 3.5–5.2)
BUN: 22 mg/dL (ref 6–23)
Calcium: 9.2 mg/dL (ref 8.4–10.5)
Creatinine, Ser: 0.6 mg/dL (ref 0.50–1.10)
GFR calc Af Amer: >60 mL/min (ref >60)
Total Protein: 7 g/dL (ref 6.0–8.3)

## 2011-07-21 LAB — CBC
HCT: 30.3 % — ABNORMAL LOW (ref 36.0–46.0)
MCH: 27 pg (ref 26.0–34.0)
MCHC: 32.7 g/dL (ref 30.0–36.0)
MCV: 82.6 fL (ref 78.0–100.0)
RDW: 13.5 % (ref 11.5–15.5)

## 2011-07-21 LAB — DIFFERENTIAL
Basophils Absolute: 0 10*3/uL (ref 0.0–0.1)
Eosinophils Relative: 2 % (ref 0–5)
Lymphocytes Relative: 22 % (ref 12–46)
Lymphs Abs: 2.4 10*3/uL (ref 0.7–4.0)
Monocytes Absolute: 1.1 10*3/uL — ABNORMAL HIGH (ref 0.1–1.0)
Monocytes Relative: 10 % (ref 3–12)

## 2011-07-21 LAB — PHOSPHORUS: Phosphorus: 3.9 mg/dL (ref 2.3–4.6)

## 2011-07-21 LAB — TRIGLYCERIDES: Triglycerides: 101 mg/dL (ref <150)

## 2011-07-21 LAB — MAGNESIUM: Magnesium: 2.2 mg/dL (ref 1.5–2.5)

## 2011-07-22 LAB — GLUCOSE, CAPILLARY
Glucose-Capillary: 152 mg/dL — ABNORMAL HIGH (ref 70–99)
Glucose-Capillary: 148 mg/dL — ABNORMAL HIGH (ref 70–99)

## 2011-07-23 LAB — BASIC METABOLIC PANEL
CO2: 26 mEq/L (ref 19–32)
Calcium: 9.2 mg/dL (ref 8.4–10.5)
Chloride: 105 mEq/L (ref 96–112)
GFR calc Af Amer: >60 mL/min (ref >60)
Sodium: 138 mEq/L (ref 135–145)

## 2011-07-23 LAB — GLUCOSE, CAPILLARY
Glucose-Capillary: 148 mg/dL — ABNORMAL HIGH (ref 70–99)
Glucose-Capillary: 144 mg/dL — ABNORMAL HIGH (ref 70–99)
Glucose-Capillary: 119 mg/dL — ABNORMAL HIGH (ref 70–99)

## 2011-07-30 LAB — CBC
HCT: 37.7
Hemoglobin: 12.6
RBC: 4.51
RDW: 14
WBC: 9.8

## 2011-07-30 LAB — COMPREHENSIVE METABOLIC PANEL
ALT: 17
Alkaline Phosphatase: 54
BUN: 13
CO2: 28
Chloride: 102
GFR calc non Af Amer: >60
Glucose, Bld: 173 — ABNORMAL HIGH
Potassium: 4.1
Sodium: 140
Total Bilirubin: 0.9

## 2011-07-30 LAB — URINE MICROSCOPIC-ADD ON

## 2011-07-30 LAB — DIFFERENTIAL
Basophils Absolute: 0
Basophils Relative: 0
Eosinophils Absolute: 0
Monocytes Relative: 4
Neutro Abs: 7.2
Neutrophils Relative %: 74

## 2011-07-30 LAB — URINALYSIS, ROUTINE W REFLEX MICROSCOPIC
Bilirubin Urine: NEGATIVE
Glucose, UA: NEGATIVE
Ketones, ur: NEGATIVE
Protein, ur: NEGATIVE

## 2011-07-31 LAB — CBC
HCT: 36.2
HCT: 39
Hemoglobin: 11.9 — ABNORMAL LOW
Hemoglobin: 12.7
MCHC: 32.7
MCHC: 32.8
MCHC: 32.8
MCV: 82.8
MCV: 83.3
Platelets: 164
Platelets: 190
Platelets: 211
RBC: 4.35
RBC: 4.71
RDW: 13.8
RDW: 13.8
RDW: 13.9
WBC: 8.6
WBC: 9.8

## 2011-07-31 LAB — BASIC METABOLIC PANEL
BUN: 13
BUN: 17
CO2: 23
CO2: 24
CO2: 27
Calcium: 8.2 — ABNORMAL LOW
Calcium: 8.4
Calcium: 8.7
Creatinine, Ser: 0.9
GFR calc Af Amer: >60
GFR calc non Af Amer: >60
GFR calc non Af Amer: >60
GFR calc non Af Amer: >60
Glucose, Bld: 69 — ABNORMAL LOW
Glucose, Bld: 72
Potassium: 3.5
Sodium: 141

## 2011-07-31 LAB — COMPREHENSIVE METABOLIC PANEL
ALT: 20
AST: 27
Albumin: 4
CO2: 24
Calcium: 9.5
Chloride: 101
Creatinine, Ser: 0.73
GFR calc Af Amer: >60
Sodium: 136

## 2011-07-31 LAB — GASTRIC OCCULT BLOOD (1-CARD TO LAB)
Occult Blood, Gastric: POSITIVE — AB
pH, Gastric: 5

## 2011-07-31 LAB — URINE CULTURE: Colony Count: 35000

## 2011-07-31 LAB — DIFFERENTIAL
Eosinophils Absolute: 0
Eosinophils Relative: 0
Lymphocytes Relative: 13
Lymphs Abs: 1.3
Monocytes Absolute: 0.2

## 2011-07-31 LAB — URINALYSIS, ROUTINE W REFLEX MICROSCOPIC
Bilirubin Urine: NEGATIVE
Glucose, UA: NEGATIVE
Hgb urine dipstick: NEGATIVE
Ketones, ur: NEGATIVE
Nitrite: NEGATIVE
Protein, ur: NEGATIVE
Specific Gravity, Urine: 1.023
Urobilinogen, UA: 1
pH: 7.5

## 2011-07-31 LAB — HEMOGLOBIN AND HEMATOCRIT, BLOOD
HCT: 32.8 — ABNORMAL LOW
HCT: 34.9 — ABNORMAL LOW
Hemoglobin: 11.3 — ABNORMAL LOW
Hemoglobin: 11.3 — ABNORMAL LOW
Hemoglobin: 11.6 — ABNORMAL LOW

## 2011-07-31 LAB — URINE MICROSCOPIC-ADD ON

## 2011-07-31 LAB — HEMOGLOBIN A1C
Hgb A1c MFr Bld: 7 — ABNORMAL HIGH
Mean Plasma Glucose: 172

## 2011-07-31 LAB — LIPASE, BLOOD: Lipase: 36

## 2011-07-31 NOTE — Discharge Summary (Signed)
  [name], [name] NO.:  [id number]  MEDICAL RECORD NO.:  [id number]  LOCATION:  5155                         FACILITY:  [facility]  PHYSICIAN:  Ollen Gross. Vernell Morgans, M.D. DATE OF BIRTH:  July 27, 1930  DATE OF ADMISSION:  07/05/2011 DATE OF DISCHARGE:  07/24/2011                              DISCHARGE SUMMARY   HISTORY OF PRESENT ILLNESS:  Ms. [name] is a 77 year old female with complaint of abdominal pain.  She presented with a history of diabetes and a history of prior small bowel obstructions.  She presented with nausea and vomiting, abdominal distention and pain.  She was seen in the emergency department.  A CT scan showed mid to distal small bowel obstruction.  Decision was made to admit the patient for attempt at conservative management with possible surgical intervention.  SUMMARY OF HOSPITAL COURSE:  The patient was admitted on July 05, 2011, by Dr. Luisa Hart, was subsequently taken to the operating room after failed conservative treatment and underwent exploratory laparotomy with extensive lysis of adhesions.  No bowel had to be resected, but there was significant number of adhesions.  Postoperatively, the patient had an extended ileus requiring TNA to support her nutrition.  She ended up getting a repeat CT scan which showed no evidence of abscess and evidence consistent with her ileus. Gradually, she started having some bowel function, was progressed to more profuse diarrhea.  Her TNA was weaned and her diet was gradually advanced from liquid to solid.  Her stools became more formed, and at this point we feel she is appropriate for discharge.  DISCHARGE DIAGNOSES: 1. Small bowel obstruction, status post  lysis of adhesions. 2. Postoperative ileus - resolved. 3. Diabetes mellitus - stable. 4. Hyperlipidemia - stable.  DISCHARGE MEDICATIONS: 1. Vicodin 1-2 tabs q.4 h. p.r.n. pain. 2. Aleve as needed. 3. Aspirin 81 mg daily. 4. Calcium once  daily. 5. Metformin 500 mg twice daily. 6. Multivitamin once daily. 7. Simvastatin 20 mg daily. 8. Vitamin B12 daily. She is given preprinted discharge instructions to follow.  Her staples have been removed, but she needs to follow up in the our office in approximately 2 weeks' time.  She needs to follow up with her PCP as indicated.     Brayton El, PA-C   ______________________________ Ollen Gross. Vernell Morgans, M.D.    KB/MEDQ  D:  07/24/2011  T:  07/24/2011  Job:  [id number]  Electronically Signed by Brayton El  on 07/30/2011 03:50:53 PM Electronically Signed by Chevis Pretty III M.D. on 07/31/2011 09:49:46 AM

## 2011-08-09 ENCOUNTER — Other Ambulatory Visit: Payer: Self-pay | Admitting: Internal Medicine

## 2011-08-11 NOTE — Telephone Encounter (Signed)
Done

## 2011-08-13 ENCOUNTER — Encounter (INDEPENDENT_AMBULATORY_CARE_PROVIDER_SITE_OTHER): Payer: Medicare Other | Admitting: Surgery

## 2011-08-15 ENCOUNTER — Encounter (INDEPENDENT_AMBULATORY_CARE_PROVIDER_SITE_OTHER): Payer: Self-pay | Admitting: Surgery

## 2011-08-15 ENCOUNTER — Ambulatory Visit (INDEPENDENT_AMBULATORY_CARE_PROVIDER_SITE_OTHER): Payer: Medicare Other | Admitting: Surgery

## 2011-08-15 VITALS — BP 120/76 | HR 72 | Resp 12 | Ht 65.0 in | Wt 187.0 lb

## 2011-08-15 DIAGNOSIS — Z9889 Other specified postprocedural states: Secondary | ICD-10-CM

## 2011-08-15 NOTE — Progress Notes (Signed)
The patient returns 6 weeks after exploratory laparotomy and lysis of adhesions for small bowel obstruction. She is doing well.  On exam: Midline incision well healed. Soft nontender nondistended.  Impression status post exploratory laparotomy and lysis of adhesions  Plan: Full activity. Follow up as needed

## 2011-08-15 NOTE — Patient Instructions (Signed)
Resume full activity.  Follow up as needed.

## 2011-08-26 ENCOUNTER — Encounter: Payer: Medicare Other | Admitting: Internal Medicine

## 2011-08-27 ENCOUNTER — Encounter: Payer: Self-pay | Admitting: Internal Medicine

## 2011-08-27 ENCOUNTER — Ambulatory Visit (INDEPENDENT_AMBULATORY_CARE_PROVIDER_SITE_OTHER): Payer: Medicare Other | Admitting: Internal Medicine

## 2011-08-27 DIAGNOSIS — D649 Anemia, unspecified: Secondary | ICD-10-CM

## 2011-08-27 DIAGNOSIS — E785 Hyperlipidemia, unspecified: Secondary | ICD-10-CM

## 2011-08-27 DIAGNOSIS — M949 Disorder of cartilage, unspecified: Secondary | ICD-10-CM

## 2011-08-27 DIAGNOSIS — Z0000 Encounter for general adult medical examination without abnormal findings: Secondary | ICD-10-CM | POA: Insufficient documentation

## 2011-08-27 DIAGNOSIS — M1990 Unspecified osteoarthritis, unspecified site: Secondary | ICD-10-CM

## 2011-08-27 DIAGNOSIS — H9319 Tinnitus, unspecified ear: Secondary | ICD-10-CM

## 2011-08-27 DIAGNOSIS — M899 Disorder of bone, unspecified: Secondary | ICD-10-CM

## 2011-08-27 DIAGNOSIS — E119 Type 2 diabetes mellitus without complications: Secondary | ICD-10-CM

## 2011-08-27 MED ORDER — ZOSTER VACCINE LIVE 19400 UNT/0.65ML ~~LOC~~ SOLR: 0.6500 mL | Freq: Once | SUBCUTANEOUS | Status: AC

## 2011-08-27 NOTE — Patient Instructions (Signed)
Came back fasting: FLP -- dx hyperlipidemia CBC---dx anemia

## 2011-08-27 NOTE — Assessment & Plan Note (Signed)
Post op anemia: recheck

## 2011-08-27 NOTE — Assessment & Plan Note (Signed)
Needs FLP

## 2011-08-27 NOTE — Assessment & Plan Note (Signed)
L sided , chronic, no hearing loss

## 2011-08-27 NOTE — Assessment & Plan Note (Signed)
Well controlled per last A1C

## 2011-08-27 NOTE — Assessment & Plan Note (Addendum)
Td 09, had a flu shot 2012 Pneumonia shot 10-05 and 2010 shingles shot Rx provided  2004: EGD and Cscope both normal ----> per GI next colonoscopy 04-2013  last PAP 2004 (-), reports normal PAPs few times after her hysterectomy in the 70s.  Breast exam today normal, encouraged SBE, states she had a MMG July 2012  DEXA 01-2010 normal Diet-exercise discussed  All labs reviewed

## 2011-08-27 NOTE — Progress Notes (Signed)
Subjective:    Patient ID: [name], female    DOB: 1930-07-14, 77 y.o.   MRN: [id number]  HPI Here for Medicare AWV:  1.Risk factors based on Past M, S, F history: reviewed with patient 2 Physical Activities: very active , church work, visit the sick , takes walks 3. Depression/mood: denies  4. Hearing: tinnitus, L ear , chronic; no decreased hearing  5. ADL's: totally independent , still drives  6. Fall Risk: no recent falls, low risk  7. Home Safety: feels safe at home, has a med alert device 8 Height, weight, &visual acuity:see VS, due for eye doctor visit  9. Counseling: diet-exercise discussed  10.Labs ordered based on risk factors: if needed  11.  Referral Coordination-- if needed  12 Care Plan-- see below  13.Cognitive Assessment-- doing well, memory normal, motor and attention span normal  in addition to the Medicare Visit we also assesed: SBO--status post surgery in September, doing better. Chart was reviewed. Diabetes--good medication compliance, normal ambulatory blood sugars. High cholesterol--good medication compliance. DJD--had a R total knee replacement last year, still having problems.   Past Medical History  Diagnosis Date  . Diabetes mellitus   . GERD (gastroesophageal reflux disease)   . Osteopenia   . Abnormal breast exam 2004    MMG neg (eval by surgeon neg)  . Hx SBO 2004 , 2007, 2012     exploratory surgery 9-12   . Iritis 2005  . Osteoarthritis    Past Surgical History  Procedure Date  . Abdominal hysterectomy     in the 70s, for bleeding, no cancer  . Bilateral oophorectomy     per pt  . Total knee arthroplasty 12/11    right  . Exploratory laparotomy 9-12     SBO   Family History  Problem Relation Age of Onset  . Diabetes Father   . Heart attack Neg Hx   . Breast cancer Sister   . Sudden death Sister   . Colon cancer Neg Hx    History   Social History  . Marital Status: Widowed    Spouse Name: N/A    Number of  Children: 2  . Years of Education: N/A   Occupational History  . Has her own business     makes aprons    Social History Main Topics  . Smoking status: Never Smoker   . Smokeless tobacco: Never Used  . Alcohol Use: No  . Drug Use: No  . Sexually Active: Not on file   Other Topics Concern  . Not on file   Social History Narrative   Lives by herself, her family is in [place], lost 2 sistersDiet: healthy-----Exercise: still active      Review of Systems No chest pain or shortness or breath No lower extremity edema No dysuria or gross hematuria No nausea, vomiting, diarrhea. No blood in the stools. Appetite is back to basically normal.     Objective:   Physical Exam  Constitutional: She is oriented to person, place, and time. She appears well-developed and well-nourished. No distress.  HENT:  Head: Normocephalic and atraumatic.  Neck: No thyromegaly present.       Carotid pulse normal  Cardiovascular: Normal rate, regular rhythm and normal heart sounds.   No murmur heard. Pulmonary/Chest: Effort normal and breath sounds normal. No respiratory distress. She has no wheezes. She has no rales.  Abdominal: Soft. Bowel sounds are normal. She exhibits no distension. There is no tenderness. There is no  rebound and no guarding.  Genitourinary:       Breast exam normal bilaterally, no axillary lymphadenopathy   Musculoskeletal: She exhibits no edema.  Neurological: She is alert and oriented to person, place, and time.  Skin: She is not diaphoretic.  Psychiatric: She has a normal mood and affect. Her behavior is normal. Judgment and thought content normal.          Assessment & Plan:

## 2011-08-27 NOTE — Assessment & Plan Note (Signed)
Still has knee pain, to see ortho soon

## 2011-08-27 NOTE — Assessment & Plan Note (Signed)
dexa normal 3-11

## 2011-09-01 ENCOUNTER — Other Ambulatory Visit (INDEPENDENT_AMBULATORY_CARE_PROVIDER_SITE_OTHER): Payer: Medicare Other

## 2011-09-01 DIAGNOSIS — D649 Anemia, unspecified: Secondary | ICD-10-CM

## 2011-09-01 DIAGNOSIS — E785 Hyperlipidemia, unspecified: Secondary | ICD-10-CM

## 2011-09-01 LAB — LIPID PANEL
HDL: 50.3 mg/dL (ref >39.00)
Triglycerides: 79 mg/dL (ref 0.0–149.0)
VLDL: 15.8 mg/dL (ref 0.0–40.0)

## 2011-09-01 LAB — CBC WITH DIFFERENTIAL/PLATELET
Basophils Absolute: 0 10*3/uL (ref 0.0–0.1)
Eosinophils Absolute: 0.1 10*3/uL (ref 0.0–0.7)
HCT: 33.9 % — ABNORMAL LOW (ref 36.0–46.0)
Lymphs Abs: 3.1 10*3/uL (ref 0.7–4.0)
MCV: 84.8 fl (ref 78.0–100.0)
Monocytes Absolute: 0.4 10*3/uL (ref 0.1–1.0)
Neutrophils Relative %: 41.5 % — ABNORMAL LOW (ref 43.0–77.0)
Platelets: 214 10*3/uL (ref 150.0–400.0)
RDW: 14.2 % (ref 11.5–14.6)
WBC: 6.2 10*3/uL (ref 4.5–10.5)

## 2011-09-01 NOTE — Progress Notes (Signed)
Labs only

## 2011-12-06 ENCOUNTER — Other Ambulatory Visit: Payer: Self-pay | Admitting: Internal Medicine

## 2011-12-08 NOTE — Telephone Encounter (Signed)
Refill done.  

## 2012-02-03 ENCOUNTER — Other Ambulatory Visit: Payer: Self-pay | Admitting: Internal Medicine

## 2012-02-03 NOTE — Telephone Encounter (Signed)
Refill done.  

## 2012-03-02 ENCOUNTER — Other Ambulatory Visit: Payer: Self-pay | Admitting: Internal Medicine

## 2012-03-02 MED ORDER — HYDROCODONE-ACETAMINOPHEN 5-500 MG PO TABS: 1.0000 | ORAL_TABLET | Freq: Three times a day (TID) | ORAL | Status: AC | PRN

## 2012-03-02 NOTE — Telephone Encounter (Signed)
Discussed with pt

## 2012-03-02 NOTE — Telephone Encounter (Signed)
Please advise 

## 2012-03-02 NOTE — Telephone Encounter (Signed)
Advise patient: If the pain is not severe, take Tylenol over-the-counter. If the pain is more intense, take hydrocodone, I just printed a prescription. Do not take both because the hydrocodone contains Tylenol already. Hydrocodone may cause drowsiness. Be careful. Also, she is due for office visit

## 2012-03-02 NOTE — Telephone Encounter (Signed)
Patient states she is in severe pain from the osteoarthritis and really needs something for pain. Can call patent at [phone] or call in medications to [facility] - [address]

## 2012-03-25 ENCOUNTER — Encounter: Payer: Self-pay | Admitting: Internal Medicine

## 2012-03-25 ENCOUNTER — Ambulatory Visit (INDEPENDENT_AMBULATORY_CARE_PROVIDER_SITE_OTHER): Payer: Medicare Other | Admitting: Internal Medicine

## 2012-03-25 VITALS — BP 128/86 | HR 73 | Temp 98.4°F | Wt 197.0 lb

## 2012-03-25 DIAGNOSIS — M1990 Unspecified osteoarthritis, unspecified site: Secondary | ICD-10-CM

## 2012-03-25 DIAGNOSIS — D649 Anemia, unspecified: Secondary | ICD-10-CM

## 2012-03-25 DIAGNOSIS — E119 Type 2 diabetes mellitus without complications: Secondary | ICD-10-CM

## 2012-03-25 DIAGNOSIS — E785 Hyperlipidemia, unspecified: Secondary | ICD-10-CM

## 2012-03-25 LAB — HEMOGLOBIN: Hemoglobin: 11.7 g/dL — ABNORMAL LOW (ref 12.0–15.0)

## 2012-03-25 LAB — BASIC METABOLIC PANEL
Calcium: 9.8 mg/dL (ref 8.4–10.5)
GFR: 95.72 mL/min (ref >60.00)
Potassium: 4.4 mEq/L (ref 3.5–5.1)
Sodium: 142 mEq/L (ref 135–145)

## 2012-03-25 LAB — HEMOGLOBIN A1C: Hgb A1c MFr Bld: 6.4 % (ref 4.6–6.5)

## 2012-03-25 MED ORDER — METFORMIN HCL 500 MG PO TABS: 500.0000 mg | ORAL_TABLET | Freq: Two times a day (BID) | ORAL | Status: DC

## 2012-03-25 MED ORDER — TRAMADOL HCL 50 MG PO TABS: 50.0000 mg | ORAL_TABLET | Freq: Three times a day (TID) | ORAL | Status: AC | PRN

## 2012-03-25 NOTE — Progress Notes (Signed)
  Subjective:    Patient ID: [name], female    DOB: [birth year], 78 y.o.   MRN: [id number]  HPI Routine office visit DJD, continue with back pain without radiation, right knee, hip and shoulder pain, she sees her orthopedic physician routinely. She tried hydrocodone, it did work a little bit but caused some GI  pain so she discontinued it  Diabetes, good medication compliance, no apparent side effects High cholesterol, good medication compliance.  Past Medical History  Diagnosis Date  . Diabetes mellitus   . GERD (gastroesophageal reflux disease)   . Osteopenia   . Abnormal breast exam 2004    MMG neg (eval by surgeon neg)  . Hx SBO 2004 , 2007, 2012     exploratory surgery 9-12   . Iritis 2005  . Osteoarthritis    Past Surgical History  Procedure Date  . Abdominal hysterectomy     in the 70s, for bleeding, no cancer  . Bilateral oophorectomy     per pt  . Total knee arthroplasty 12/11    right  . Exploratory laparotomy 9-12     SBO     Review of Systems History of a postop anemia, denies nausea, vomiting, diarrhea. No stomach pain (other than discomfort induced by hydrocodone) or blood in the stools. No chest pain or shortness of breath     Objective:   Physical Exam  General -- alert, well-developed. No apparent distress.   Extremities-- inspection and palpation was of wrists, hands and elbows show no synovitis DIABETIC FEET EXAM: No lower extremity edema Normal pedal pulses bilaterally Skin and nails are normal without calluses Pinprick examination of the feet normal. Neurologic-- alert & oriented X3 and strength normal in all extremities. Psych-- Cognition and judgment appear intact. Alert and cooperative with normal attention span and concentration.  not anxious appearing and not depressed appearing.         Assessment & Plan:

## 2012-03-25 NOTE — Assessment & Plan Note (Addendum)
Continue with pain in the back and all her joints. Thinking about a second opinion from a different orthopedic doctor in [facility]. I also told her to call me if she likes  a second opinion from one of our local orthopedic doctors Did not like hydrocodone, see history of present illness Chart is reviewed , couldn't tolerate meloxicam. Plan: Motrin as needed, watch for side effects, Ultram when necessary.

## 2012-03-25 NOTE — Patient Instructions (Signed)
For pain, try a low dose of Motrin, 200 to 400 mg every 8 hours as needed, take it with food, watch for stomach irritation-gastritis. If the pain continued, you can take Ultram as needed. Watch for drowsiness. Stop simvastatin for 4-6 weeks, then go back to it. See if that has any impact on your pain. Let me know the results of this trial

## 2012-03-25 NOTE — Assessment & Plan Note (Signed)
Postop anemia, hemoglobin was improving, recheck a hemoglobin.

## 2012-03-25 NOTE — Assessment & Plan Note (Addendum)
Good compliance with medications, due for a A1c Feet exam negative today

## 2012-03-25 NOTE — Assessment & Plan Note (Signed)
Well-controlled, will hold simvastatin 4-6 weeks to see if that decreases her aches and pains. See osteoarthritis, see instructions.

## 2012-03-30 ENCOUNTER — Encounter: Payer: Self-pay | Admitting: *Deleted

## 2012-04-05 ENCOUNTER — Telehealth: Payer: Self-pay | Admitting: Internal Medicine

## 2012-04-05 NOTE — Telephone Encounter (Signed)
Advise pt, We will  discuss further on RTC, what med is the one we d/c?

## 2012-04-05 NOTE — Telephone Encounter (Signed)
Please advise 

## 2012-04-05 NOTE — Telephone Encounter (Signed)
Left msg to call the office

## 2012-04-05 NOTE — Telephone Encounter (Signed)
Patient states she was told to call Dr. Drue Novel and let him know how she is doing after he recently took her off her medication. Pt states the ringing in her ears is better, but not completely gone.

## 2012-05-13 ENCOUNTER — Telehealth: Payer: Self-pay | Admitting: *Deleted

## 2012-05-13 NOTE — Telephone Encounter (Signed)
Pt states that she restarted med and ringing in her ears has return.Please advise

## 2012-05-14 MED ORDER — ATORVASTATIN CALCIUM 10 MG PO TABS: 10.0000 mg | ORAL_TABLET | Freq: Every day | ORAL | Status: DC

## 2012-05-14 NOTE — Telephone Encounter (Signed)
Switch her to Lipitor 10 mg 1 by mouth each bedtime #30 and 4 refills. Will check her cholesterol when she comes back ( fasting)

## 2012-05-14 NOTE — Telephone Encounter (Signed)
Discuss with patient, Rx sent. 

## 2012-06-08 ENCOUNTER — Other Ambulatory Visit: Payer: Self-pay | Admitting: Internal Medicine

## 2012-06-08 DIAGNOSIS — Z1231 Encounter for screening mammogram for malignant neoplasm of breast: Secondary | ICD-10-CM

## 2012-06-24 ENCOUNTER — Ambulatory Visit
Admission: RE | Admit: 2012-06-24 | Discharge: 2012-06-24 | Disposition: A | Discharge status: Home / Self Care | Payer: Medicare Other | Source: Ambulatory Visit | Attending: Internal Medicine | Admitting: Internal Medicine

## 2012-06-24 DIAGNOSIS — Z1231 Encounter for screening mammogram for malignant neoplasm of breast: Secondary | ICD-10-CM

## 2012-08-11 ENCOUNTER — Ambulatory Visit (INDEPENDENT_AMBULATORY_CARE_PROVIDER_SITE_OTHER): Payer: Medicare Other | Admitting: Internal Medicine

## 2012-08-11 ENCOUNTER — Encounter: Payer: Self-pay | Admitting: Internal Medicine

## 2012-08-11 VITALS — BP 122/68 | HR 81 | Temp 98.4°F | Wt 202.0 lb

## 2012-08-11 DIAGNOSIS — R109 Unspecified abdominal pain: Secondary | ICD-10-CM

## 2012-08-11 DIAGNOSIS — M1990 Unspecified osteoarthritis, unspecified site: Secondary | ICD-10-CM

## 2012-08-11 NOTE — Assessment & Plan Note (Signed)
78 year old lady with a history of SBOs, s/p exploratory laparotomy 07/2011 for  Adhesions presents with distal RLQ abdominal pain, symptoms are atypical, some features point to a musculoskeletal cause. GI review of systems negative, abdominal exam is benign. At this point, I will recommend observation, see instructions. I don't think the pain is due to  an intra-abdominal source, rather related to DJD.

## 2012-08-11 NOTE — Assessment & Plan Note (Signed)
Continue with chronic right knee pain since total knee replacement 2011, now w/ right shoulder pain for 3 months. Plan: Refer to a local orthopedic doctor ( different from her original Dr, Dr. Madelon Lips).

## 2012-08-11 NOTE — Progress Notes (Signed)
  Subjective:    Patient ID: [name], female    DOB: [birth year], 78 y.o.   MRN: [id number]  HPI Acute visit 2 weeks history of pain at the distal RLQ of the abdomen/inguinal area, pain is on and off and only when she tries to stand up. Denies any radiation, not bulging mass in that area. Also, has ongoing right knee pain since 2011 and a shoulder pain for the last 2 or 3 months, request for orthopedic referral.  Past Medical History: AODM GERD DJD Hyperlipidemia Osteopenia h/o SBO (multiple) h/o iritis aprox 2005 normal carotid ultrasound 01/2009  Past Surgical History: Hysterectomy in the 70s, for bleeding, no cancer  Oophorectomy Bilat per pt R TKR 12-11 Exploratory laparotomy 07-2011, SBO, adhesions  Review of Systems Denies fever or chills Has been taking Augmentin for a month for a dental infection, she will finish her antibiotics tomorrow. No vomiting or diarrhea, mild nausea for the last 2-3 days. Bowel movements are normal without blood. No rash in the back or leg No heartburn   No dysuria or gross hematuria.    Objective:   Physical Exam  Constitutional: She appears well-developed and well-nourished. No distress.  HENT:  Head: Normocephalic.       Not pale   Cardiovascular: Normal rate, regular rhythm and normal heart sounds.   No murmur heard. Pulmonary/Chest: Effort normal and breath sounds normal. No respiratory distress. She has no wheezes. She has no rales.  Abdominal:         Abdomen is nondistended, good bowel sounds, no organomegaly, there is no tenderness to deep palpation even at the area of perceived pain. No mass. No inguinal hernias   Musculoskeletal:       Rotation of the right hip did to some extent reproduced the "abdominal pain"  Skin: She is not diaphoretic.       Assessment & Plan:   78 year old lady with a history of exploratory laparotomy 07/2011 for  Adhesions presents with distal RLQ abdominal pain, symptoms are atypical,  some features point to a musculoskeletal cause. GI review of systems negative, abdominal exam is benign. At this point, I will recommend observation, see instructions. I don't think the pain is due to  an intra-abdominal source, rather related to DJD.

## 2012-08-11 NOTE — Patient Instructions (Addendum)
Rest, Tylenol for pain. I believe the pain at the  lower abdomen is due to  arthritis, however if you get worse, have fever, chills, constipation, nausea: Let me know immediately. I am referring you to a new orthopedic doctor

## 2012-10-02 IMAGING — CR DG ABDOMEN 1V
2 series · 2 of 2 positions shown · non-contrast
Comparison: CT of 1 day prior

CLINICAL DATA: Small bowel obstruction.

ABDOMEN - 1 VIEW

[t abdomen supine * (1 of 2)]
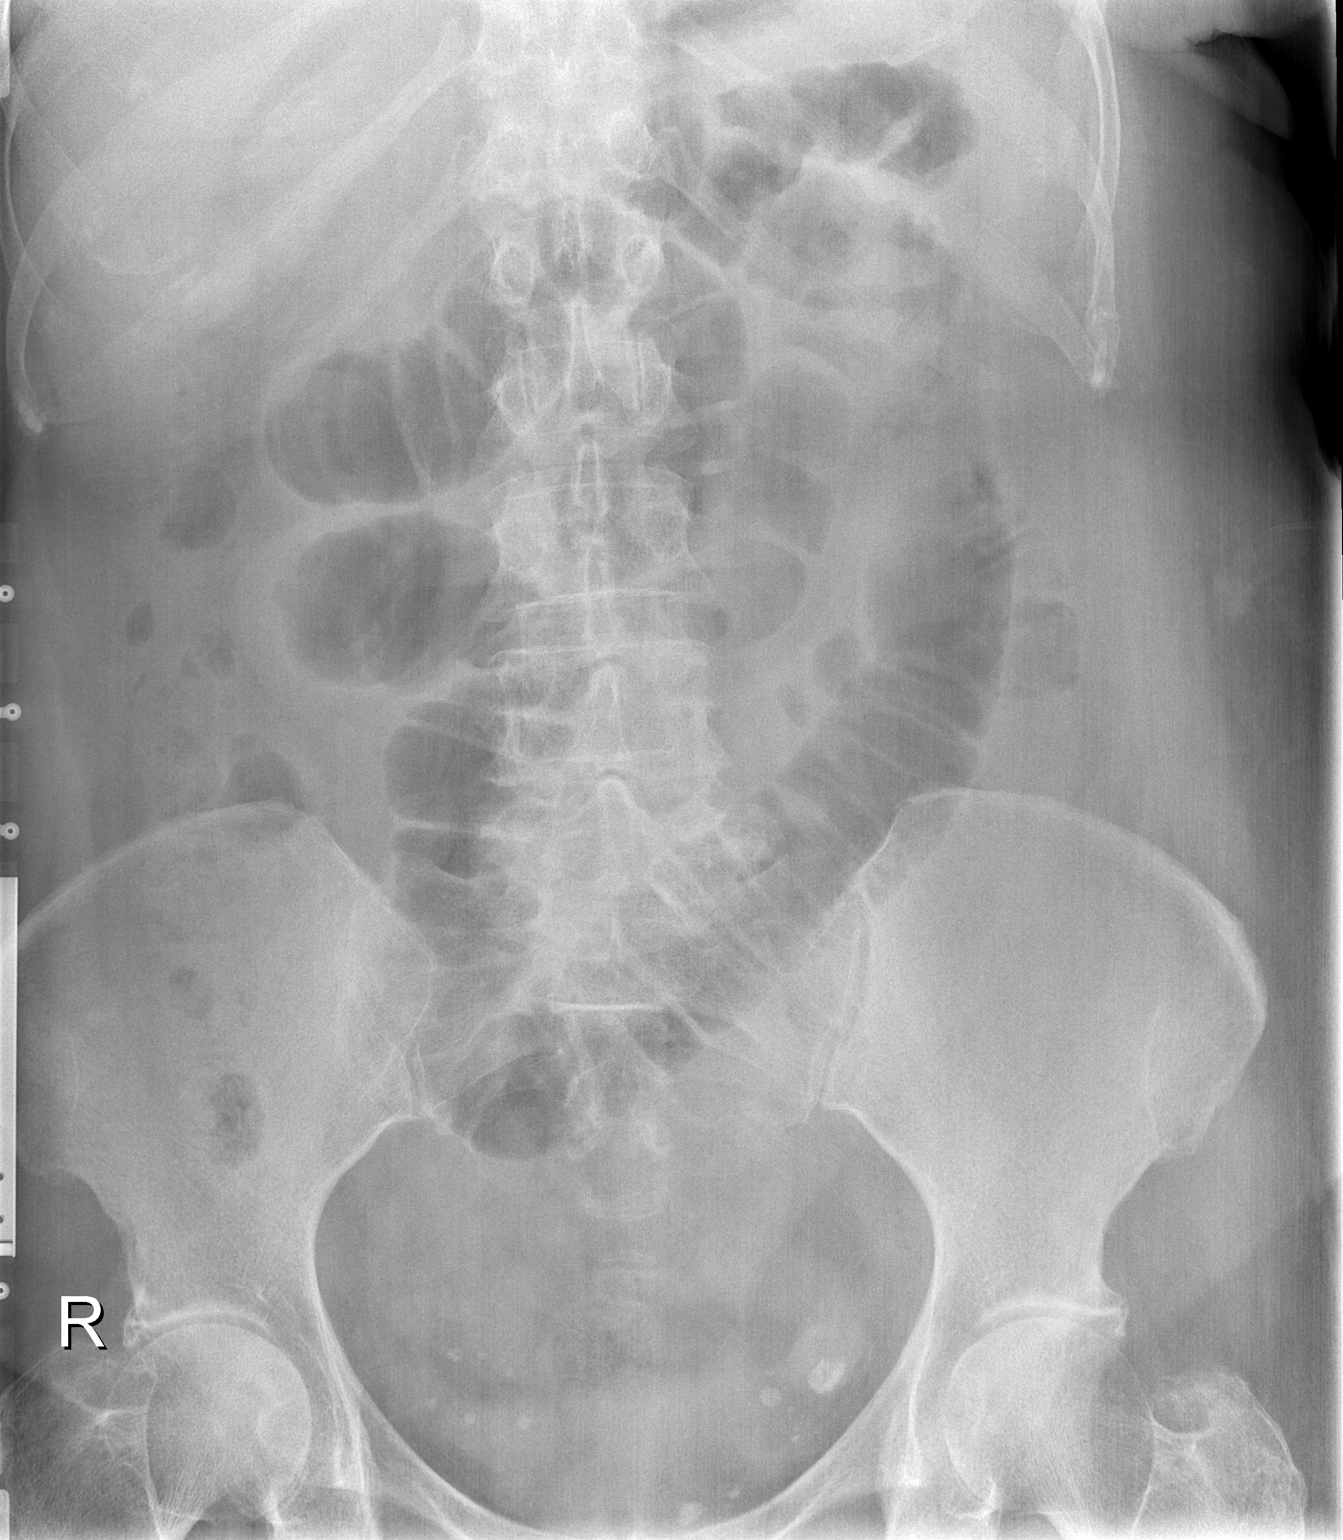

[t abdomen supine * (2 of 2)]
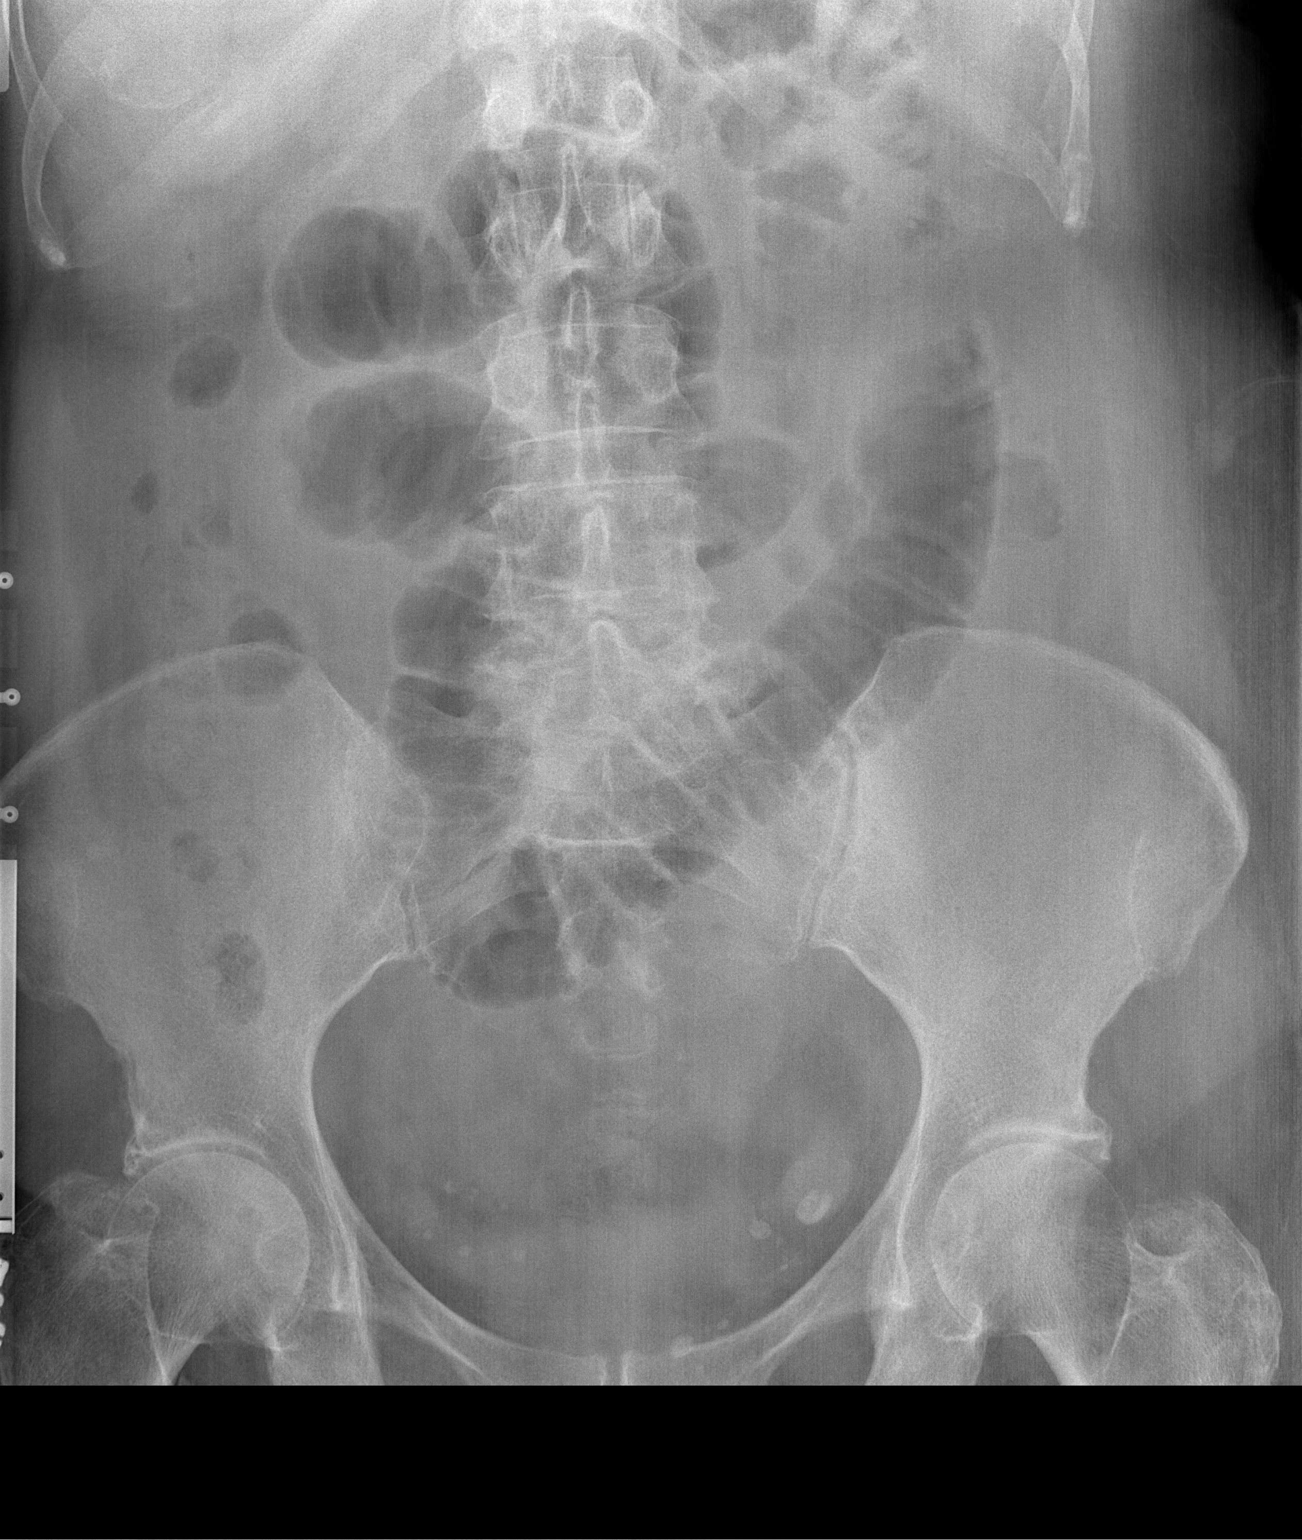

[2 of 2 positions shown; findings below may reference images not displayed]

FINDINGS: 2 supine views.  Small bowel dilatation persists.
cm, similar to on the prior CT.  No pneumatosis or free
intraperitoneal air.  Minimal colonic and distal gas identified.
Phleboliths in the pelvis.
IMPRESSION: No change in high-grade partial small bowel obstruction.

## 2012-10-09 IMAGING — CR DG ABDOMEN ACUTE W/ 1V CHEST
3 series · 3 of 3 positions shown · non-contrast
Comparison: [DATE]

CLINICAL DATA: Abdominal pain and tightness.

ACUTE ABDOMEN SERIES (ABDOMEN 2 VIEW & CHEST 1 VIEW)

[w chest pa]
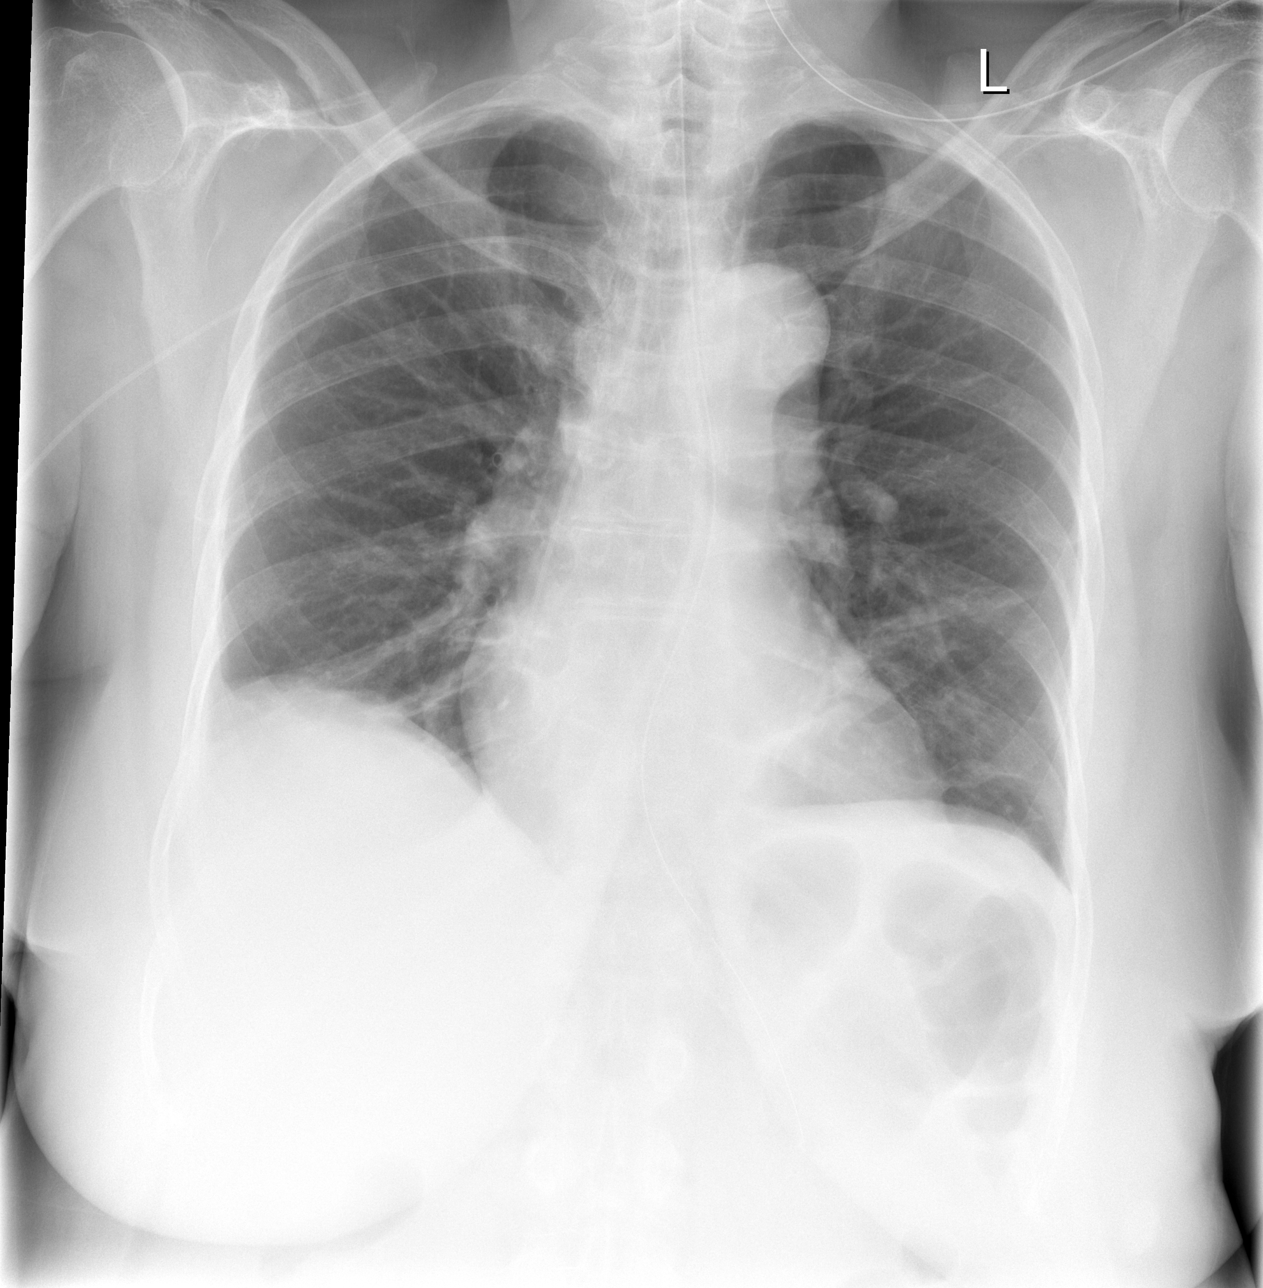

[w abdomen upright]
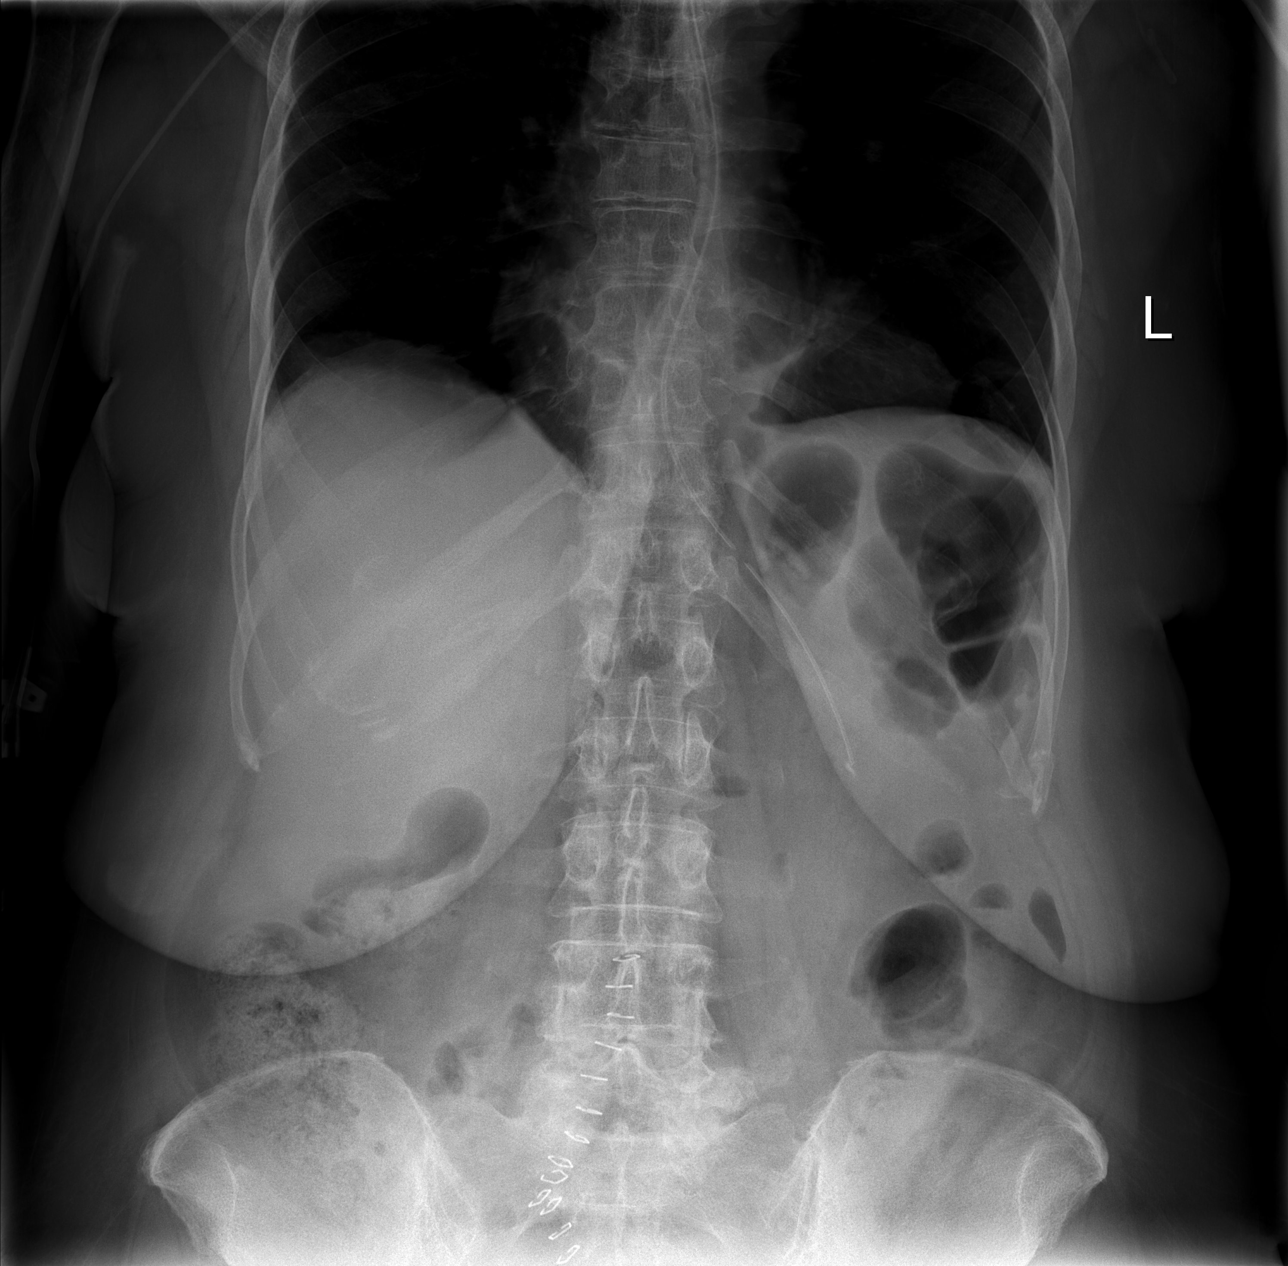

[t abdomen supine]
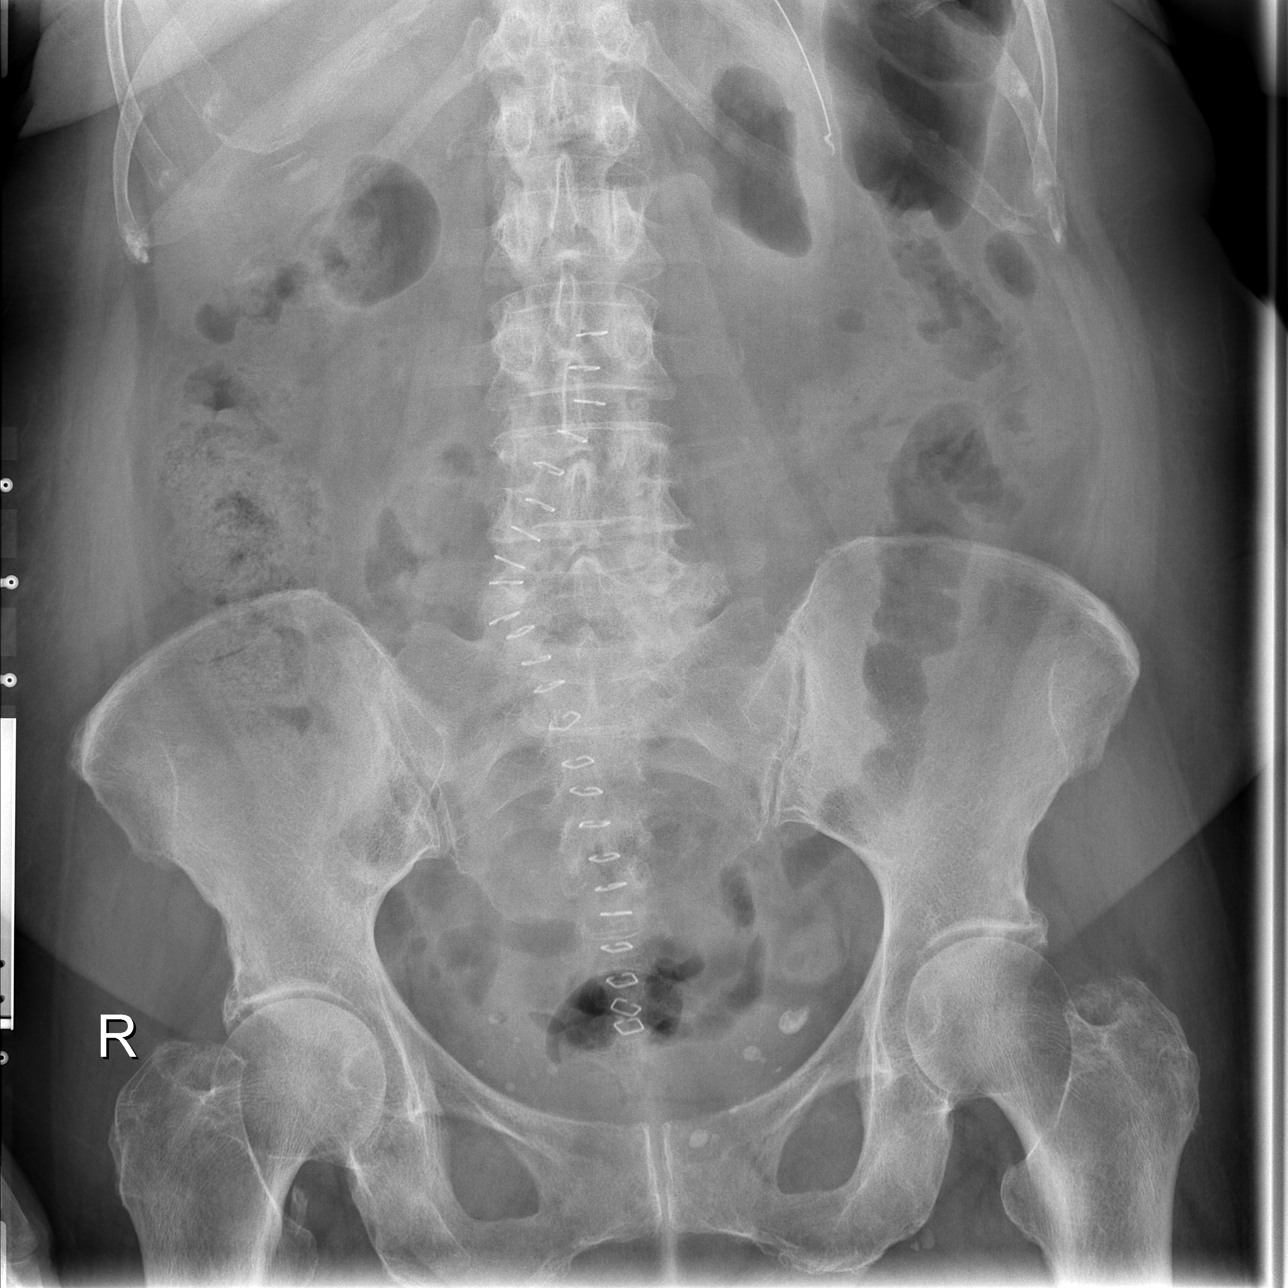

[3 of 3 positions shown; findings below may reference images not displayed]

FINDINGS: Nasogastric tube has its tip in the mid stomach.  Midline
skin staples are present.  The bowel gas pattern is normal without
evidence of ileus or obstruction.  No sign of free air.  No
worrisome calcifications or significant bony findings.

One-view chest does not show any free air.  Heart size is normal.
There is mild atelectasis at the lung bases.  Right arm PICC has
its tip either at the SVC/RA junction or within the proximal right
atrium.  Withdraw 2 cm to be definitely above the right atrium.
IMPRESSION: Unremarkable gas pattern postoperatively.  Nasogastric tube mid
stomach.  No free air.

Mild basilar pulmonary atelectasis.  Right arm PICC probably and
proximal right atrium.  Withdraw 2 cm to be clearly above the right
atrium if desired.

## 2012-10-25 LAB — HM DIABETES EYE EXAM

## 2012-11-05 ENCOUNTER — Encounter: Payer: Self-pay | Admitting: *Deleted

## 2012-11-18 ENCOUNTER — Encounter: Payer: Self-pay | Admitting: *Deleted

## 2013-02-01 ENCOUNTER — Encounter: Payer: Self-pay | Admitting: Internal Medicine

## 2013-02-01 ENCOUNTER — Ambulatory Visit (INDEPENDENT_AMBULATORY_CARE_PROVIDER_SITE_OTHER): Payer: Medicare Other | Admitting: Internal Medicine

## 2013-02-01 VITALS — BP 118/72 | HR 73 | Temp 98.3°F | Ht 65.0 in | Wt 202.0 lb

## 2013-02-01 DIAGNOSIS — E119 Type 2 diabetes mellitus without complications: Secondary | ICD-10-CM

## 2013-02-01 DIAGNOSIS — R109 Unspecified abdominal pain: Secondary | ICD-10-CM

## 2013-02-01 DIAGNOSIS — Z0000 Encounter for general adult medical examination without abnormal findings: Secondary | ICD-10-CM

## 2013-02-01 DIAGNOSIS — R350 Frequency of micturition: Secondary | ICD-10-CM

## 2013-02-01 DIAGNOSIS — D649 Anemia, unspecified: Secondary | ICD-10-CM

## 2013-02-01 DIAGNOSIS — M1990 Unspecified osteoarthritis, unspecified site: Secondary | ICD-10-CM

## 2013-02-01 DIAGNOSIS — H9319 Tinnitus, unspecified ear: Secondary | ICD-10-CM

## 2013-02-01 DIAGNOSIS — E785 Hyperlipidemia, unspecified: Secondary | ICD-10-CM

## 2013-02-01 LAB — CBC WITH DIFFERENTIAL/PLATELET
Basophils Absolute: 0 10*3/uL (ref 0.0–0.1)
Eosinophils Absolute: 0.1 10*3/uL (ref 0.0–0.7)
Hemoglobin: 12 g/dL (ref 12.0–15.0)
Lymphocytes Relative: 49.5 % — ABNORMAL HIGH (ref 12.0–46.0)
Lymphs Abs: 3.7 10*3/uL (ref 0.7–4.0)
MCHC: 32.7 g/dL (ref 30.0–36.0)
Neutro Abs: 3.1 10*3/uL (ref 1.4–7.7)
Platelets: 212 10*3/uL (ref 150.0–400.0)
RDW: 14.2 % (ref 11.5–14.6)

## 2013-02-01 LAB — BASIC METABOLIC PANEL
BUN: 16 mg/dL (ref 6–23)
Calcium: 9.3 mg/dL (ref 8.4–10.5)
Creatinine, Ser: 0.7 mg/dL (ref 0.4–1.2)
GFR: 101.75 mL/min (ref >60.00)
Glucose, Bld: 88 mg/dL (ref 70–99)
Potassium: 4.1 mEq/L (ref 3.5–5.1)

## 2013-02-01 LAB — ALT: ALT: 16 U/L (ref 0–35)

## 2013-02-01 LAB — LIPID PANEL
Total CHOL/HDL Ratio: 4
Triglycerides: 60 mg/dL (ref 0.0–149.0)

## 2013-02-01 LAB — TSH: TSH: 1.45 u[IU]/mL (ref 0.35–5.50)

## 2013-02-01 MED ORDER — ZOSTER VACCINE LIVE 19400 UNT/0.65ML ~~LOC~~ SOLR: 0.6500 mL | Freq: Once | SUBCUTANEOUS | Status: DC

## 2013-02-01 NOTE — Assessment & Plan Note (Signed)
Currently, main pain is in the right hip, saw Dr. Despina Hick, had a local injection, it helped a great deal, symptoms are coming back. Recommend to call them again.

## 2013-02-01 NOTE — Assessment & Plan Note (Signed)
Symptoms improved after a local injection in the head, symptoms slightly DJD-related.

## 2013-02-01 NOTE — Assessment & Plan Note (Signed)
Labs

## 2013-02-01 NOTE — Assessment & Plan Note (Signed)
Recently discontinued Lipitor due to to tinnitus, this is the second stating that caused  tinnitus. Plan: Labs, zetia, livalo?

## 2013-02-01 NOTE — Assessment & Plan Note (Signed)
Good med compliance, labs  (-) eye check 10-2012 Dr Dione Booze

## 2013-02-01 NOTE — Assessment & Plan Note (Signed)
Resolved after lipitor d/c

## 2013-02-01 NOTE — Assessment & Plan Note (Addendum)
Td 09 Pneumonia shot 10-05 and 2010 shingles shot Rx was provided last year but did not use --- Rx reissued   2004: EGD and Cscope both normal ----> per GI next colonoscopy 04-2013, pt is willing to go ahead and do it if needed  last PAP 2004 (-), reports normal PAPs few times after her hysterectomy in the 70s. No further PAPs Breast exam today normal  MMG normal 06-2012 DEXA 01-2010 normal Diet-exercise discussed  Labs Check a UA, dx urinary frequency (see ROS)

## 2013-02-01 NOTE — Progress Notes (Signed)
Subjective:    Patient ID: [name], female    DOB: November 21, 1929, 79 y.o.   MRN: [id number]  HPI Here for Medicare AWV: 1.Risk factors based on Past M, S, F history: reviewed with patient 2 Physical Activities: very active , church work,takes walks 3. Depression/mood: (-) screen 4. Hearing: tinnitus, L ear, chronic but resolved after we stopped lipitor; no decreased hearing   5. ADL's: totally independent , still drives   6. Fall Risk: no recent falls, see instructions 7. Home Safety: feels safe at home, has a med alert device 8 Height, weight, &visual acuity:see VS, saw eye doctor this year, + cataract, no retinopathy per pt 9. Counseling: diet-exercise discussed   10.Labs ordered based on risk factors: if needed   11.  Referral Coordination-- if needed   12 Care Plan-- see below   13.Cognitive Assessment-- doing well, memory normal, motor and attention span normal  in addition to the Medicare Visit we also assesed: DJD, s/p local  shot at the right hip, had, great relief, symptoms are coming back, needs to see ortho again. High cholesterol, we held Lipitor, tinnitus is gone. Patient is intolerant to Lipitor. Diabetes, good compliance with medications, blood sugars fasting 107, after eating around 138.  Past Medical History  Diagnosis Date  . Diabetes mellitus   . GERD (gastroesophageal reflux disease)   . Osteopenia   . Abnormal breast exam 2004    MMG neg (eval by surgeon neg)  . Hx SBO 2004 , 2007, 2012     exploratory surgery 9-12   . Iritis 2005  . Osteoarthritis    Past Surgical History  Procedure Laterality Date  . Abdominal hysterectomy      in the 70s, for bleeding, no cancer  . Bilateral oophorectomy      per pt  . Total knee arthroplasty  12/11    right  . Exploratory laparotomy  9-12     SBO   History   Social History  . Marital Status: Widowed    Spouse Name: N/A    Number of Children: 2  . Years of Education: N/A   Occupational History  .  Has her own business     makes aprons    Social History Main Topics  . Smoking status: Never Smoker   . Smokeless tobacco: Never Used  . Alcohol Use: No  . Drug Use: No  . Sexually Active: Not on file   Other Topics Concern  . Not on file   Social History Narrative   Lives by herself, her family is in [place], lost 2 sisters   Diet: healthy-----   Exercise: still active          Family History  Problem Relation Age of Onset  . Diabetes Father   . Heart attack Neg Hx   . Breast cancer Sister   . Sudden death Sister   . Colon cancer Neg Hx       Review of Systems No chest pain or shortness or breath No nausea, vomiting, diarrhea blood in the stools. No dysuria or gross hematuria  has noted occ urinary urgency, frequency. Urinates very little when she goes.     Objective:   Physical Exam General -- alert, well-developed, NAD .   Neck --no thyromegaly , normal carotid pulse Breasts-- No mass, nodules, thickening, tenderness, bulging, retraction, inflamation, nipple discharge or skin changes noted.  no axillary lymph nodes Lungs -- normal respiratory effort, no intercostal retractions, no accessory muscle use,  and normal breath sounds.   Heart-- normal rate, regular rhythm, no murmur, and no gallop.   Abdomen--soft, non-tender, no distention, no masses, no HSM, no guarding, and no rigidity.   Extremities-- no pretibial edema bilaterally  Neurologic-- alert & oriented X3 and strength normal in all extremities. Psych-- Cognition and judgment appear intact. Alert and cooperative with normal attention span and concentration.  not anxious appearing and not depressed appearing.       Assessment & Plan:

## 2013-02-01 NOTE — Patient Instructions (Addendum)
Next visit in 4 to 6 months     Fall Prevention and Home Safety Falls cause injuries and can affect all age groups. It is possible to use preventive measures to significantly decrease the likelihood of falls. There are many simple measures which can make your home safer and prevent falls. OUTDOORS  Repair cracks and edges of walkways and driveways.  Remove high doorway thresholds.  Trim shrubbery on the main path into your home.  Have good outside lighting.  Clear walkways of tools, rocks, debris, and clutter.  Check that handrails are not broken and are securely fastened. Both sides of steps should have handrails.  Have leaves, snow, and ice cleared regularly.  Use sand or salt on walkways during winter months.  In the garage, clean up grease or oil spills. BATHROOM  Install night lights.  Install grab bars by the toilet and in the tub and shower.  Use non-skid mats or decals in the tub or shower.  Place a plastic non-slip stool in the shower to sit on, if needed.  Keep floors dry and clean up all water on the floor immediately.  Remove soap buildup in the tub or shower on a regular basis.  Secure bath mats with non-slip, double-sided rug tape.  Remove throw rugs and tripping hazards from the floors. BEDROOMS  Install night lights.  Make sure a bedside light is easy to reach.  Do not use oversized bedding.  Keep a telephone by your bedside.  Have a firm chair with side arms to use for getting dressed.  Remove throw rugs and tripping hazards from the floor. KITCHEN  Keep handles on pots and pans turned toward the center of the stove. Use back burners when possible.  Clean up spills quickly and allow time for drying.  Avoid walking on wet floors.  Avoid hot utensils and knives.  Position shelves so they are not too high or low.  Place commonly used objects within easy reach.  If necessary, use a sturdy step stool with a grab bar when  reaching.  Keep electrical cables out of the way.  Do not use floor polish or wax that makes floors slippery. If you must use wax, use non-skid floor wax.  Remove throw rugs and tripping hazards from the floor. STAIRWAYS  Never leave objects on stairs.  Place handrails on both sides of stairways and use them. Fix any loose handrails. Make sure handrails on both sides of the stairways are as long as the stairs.  Check carpeting to make sure it is firmly attached along stairs. Make repairs to worn or loose carpet promptly.  Avoid placing throw rugs at the top or bottom of stairways, or properly secure the rug with carpet tape to prevent slippage. Get rid of throw rugs, if possible.  Have an electrician put in a light switch at the top and bottom of the stairs. OTHER FALL PREVENTION TIPS  Wear low-heel or rubber-soled shoes that are supportive and fit well. Wear closed toe shoes.  When using a stepladder, make sure it is fully opened and both spreaders are firmly locked. Do not climb a closed stepladder.  Add color or contrast paint or tape to grab bars and handrails in your home. Place contrasting color strips on first and last steps.  Learn and use mobility aids as needed. Install an electrical emergency response system.  Turn on lights to avoid dark areas. Replace light bulbs that burn out immediately. Get light switches that glow.  Arrange  furniture to create clear pathways. Keep furniture in the same place.  Firmly attach carpet with non-skid or double-sided tape.  Eliminate uneven floor surfaces.  Select a carpet pattern that does not visually hide the edge of steps.  Be aware of all pets. OTHER HOME SAFETY TIPS  Set the water temperature for 120 F (48.8 C).  Keep emergency numbers on or near the telephone.  Keep smoke detectors on every level of the home and near sleeping areas. Document Released: 10/10/2002 Document Revised: 04/20/2012 Document Reviewed:  01/09/2012 [facility] Patient Information 2013 [place], [place].

## 2013-02-03 LAB — POCT URINALYSIS DIPSTICK
Bilirubin, UA: NEGATIVE
Ketones, UA: NEGATIVE
Leukocytes, UA: NEGATIVE
Spec Grav, UA: 1.01
pH, UA: 7

## 2013-02-03 NOTE — Addendum Note (Signed)
Addended by: Silvio Pate D on: 02/03/2013 04:21 PM   Modules accepted: Orders

## 2013-02-03 NOTE — Addendum Note (Signed)
Addended by: Seerat Peaden D on: 02/03/2013 04:21 PM   Modules accepted: Orders  

## 2013-02-14 ENCOUNTER — Telehealth: Payer: Self-pay | Admitting: *Deleted

## 2013-02-14 MED ORDER — EZETIMIBE 10 MG PO TABS: 10.0000 mg | ORAL_TABLET | Freq: Every day | ORAL | Status: DC

## 2013-02-14 NOTE — Telephone Encounter (Signed)
Message copied by Verdene Rio on Mon Feb 14, 2013  9:29 AM ------      Message from: Wanda Plump      Created: Sat Feb 05, 2013  6:16 PM       Please call patient:      Diabetes continue to be well-controlled.      Since she stopped Lipitor, cholesterol has increased. Recommend to add zetia 10 mg one by mouth daily, #30 and 12 refills.       All other labs normal.       ------

## 2013-02-14 NOTE — Telephone Encounter (Signed)
Rx sent, Discuss with patient  

## 2013-02-23 ENCOUNTER — Telehealth: Payer: Self-pay | Admitting: Internal Medicine

## 2013-02-23 NOTE — Telephone Encounter (Signed)
Check on her tomorrow, was recommended to go to the ER due to to hemoptysis.

## 2013-02-23 NOTE — Telephone Encounter (Signed)
Patient Information:  Caller Name: Adalyna  Phone: [phone]  Patient: [name]  Gender: Female  DOB: [birth year]  Age: 79 Years  PCP: Willow Ora  Office Follow Up:  Does the office need to follow up with this patient?: No  Instructions For The Office: N/A  RN Note:  Patient states she developed head and chest congestion, onset 02/21/13. Afebrile. Patient states she expectorated green sputum with "bloody" sputum 02/23/13. Denies wheezing. Patient complains of "mild" shortness of breath. Patient states she had long distance travel >6 hours 02/13/13. Care advice given per guidelines. Patient advised to be evaluated in the ED now. Patient advised not to drive self. Patient verbalizes understanding and agreeable.  Symptoms  Reason For Call & Symptoms: Head and chest congestion  Reviewed Health History In EMR: Yes  Reviewed Medications In EMR: Yes  Reviewed Allergies In EMR: Yes  Reviewed Surgeries / Procedures: Yes  Date of Onset of Symptoms: 02/21/2013  Treatments Tried: Honey, lemon, Airborne  Treatments Tried Worked: No  Guideline(s) Used:  Breathing Difficulty  Disposition Per Guideline:   Go to ED Now  Reason For Disposition Reached:   Recent long-distance travel with prolonged time in car, bus, plane, or train (i.e., within past 2 weeks; 6 or more hours duration)  Advice Given:  Call Back If:  You become worse.  Patient Will Follow Care Advice:  YES

## 2013-02-23 NOTE — Telephone Encounter (Signed)
Please advise 

## 2013-02-24 NOTE — Telephone Encounter (Signed)
Pt called back wanted to let Dr. Drue Novel know that she is not feeling great, but is doing better today. Also advised that she received her Shingles vaccination today. Pt chart updated to reflect this.

## 2013-02-24 NOTE — Telephone Encounter (Signed)
Based on history of hemoptysis and recent car trip she was recommended to go to the ER, evidently she didn't. I think she needs to be seen, ER today if chest pain, fever, chills or if she had more hemoptysis.  Office visit tomorrow at 1 PM (work-in) with me if she is feeling okay.

## 2013-02-24 NOTE — Telephone Encounter (Signed)
Discussed with pt, she is coming in tomorrow @ 1p.  [place], please add pt on to schedule tomorrow @ 1p.

## 2013-02-24 NOTE — Telephone Encounter (Signed)
lmovm for pt to return call.  

## 2013-02-24 NOTE — Telephone Encounter (Signed)
Done

## 2013-02-25 ENCOUNTER — Encounter: Payer: Self-pay | Admitting: Internal Medicine

## 2013-02-25 ENCOUNTER — Ambulatory Visit (INDEPENDENT_AMBULATORY_CARE_PROVIDER_SITE_OTHER): Payer: Medicare Other | Admitting: Internal Medicine

## 2013-02-25 VITALS — BP 112/66 | HR 87 | Temp 98.6°F | Wt 202.0 lb

## 2013-02-25 DIAGNOSIS — J019 Acute sinusitis, unspecified: Secondary | ICD-10-CM

## 2013-02-25 MED ORDER — AMOXICILLIN 500 MG PO CAPS: 1000.0000 mg | ORAL_CAPSULE | Freq: Two times a day (BID) | ORAL | Status: AC

## 2013-02-25 MED ORDER — AZELASTINE HCL 0.1 % NA SOLN: 2.0000 | Freq: Two times a day (BID) | NASAL | Status: DC

## 2013-02-25 NOTE — Progress Notes (Signed)
  Subjective:    Patient ID: [name], female    DOB: 1930/05/29, 79 y.o.   MRN: [id number]  HPI Acute visit Developed a cold 02/21/2013. The next day she noted a lot of dark green mucus pooling of her throat, she thinks it was from postnasal dripping, saw few strikes of  red blood in it. She still has the same on and off. She flew back from [facility]   02/20/2013. She was recommended to go to the ER given the onset of hemoptysis after airplane fly but elected not to go, she's here for eval.  Past Medical History  Diagnosis Date  . Diabetes mellitus   . GERD (gastroesophageal reflux disease)   . Osteopenia   . Abnormal breast exam 2004    MMG neg (eval by surgeon neg)  . Hx SBO 2004 , 2007, 2012     exploratory surgery 9-12   . Iritis 2005  . Osteoarthritis    Past Surgical History  Procedure Laterality Date  . Abdominal hysterectomy      in the 70s, for bleeding, no cancer  . Bilateral oophorectomy      per pt  . Total knee arthroplasty  12/11    right  . Exploratory laparotomy  9-12     SBO      Review of Systems Denies fever, some chills. No  nausea, vomiting, diarrhea. No chest pain or shortness or breath. No actual chest congestion and actually no cough. Denies any swelling in her legs or pain in the calves     Objective:   Physical Exam General -- alert, well-developed,No apparent distress, vital signs are stable .   HEENT -- TMs normal, throat w/o redness, face symmetric and not tender to palpation, Nose quite congested, no discharge noted Lungs -- normal respiratory effort, no intercostal retractions, no accessory muscle use, and normal breath sounds.   Heart-- normal rate, regular rhythm, no murmur, and no gallop.   Extremities-- no pretibial edema bilaterally, calves symmetric (measured w/ a tape) and no tender   Neurologic-- alert & oriented X3 and strength normal in all extremities. Psych-- Cognition and judgment appear intact. Alert and  cooperative with normal attention span and concentration.  not anxious appearing and not depressed appearing.       Assessment & Plan:  Acute sinusitis Patient was asket  to be elevated since she complained of "hemoptysis" in the setting of a recent fly. On further questioning, I'm not sure if she had true hemoptysis, seems like the mucous and blood is coming from her sinuses. Furthermore, on clinical grounds I have little suspicious for a serious etiology such as a PE. Plan: Treat sinusitis. See Instructions.

## 2013-02-25 NOTE — Patient Instructions (Addendum)
Rest, fluids , tylenol take Mucinex  twice a day for 1 week then as needed  For congestion use astelin nasal spray twice a day until you feel better Take the antibiotic as prescribed  (Amoxicillin) Call if no better in few days Call anytime if the symptoms are severe, you have high fever, short of breath, chest pain

## 2013-03-08 ENCOUNTER — Encounter: Payer: Self-pay | Admitting: Gastroenterology

## 2013-03-22 ENCOUNTER — Telehealth: Payer: Self-pay | Admitting: General Practice

## 2013-03-22 NOTE — Telephone Encounter (Signed)
Pt called stating that she is having tingling in her left arm, has been going on for the past week. Pt would like to know if it is a side effect of the Zetia. Pt would also like provider to know that she is still having the sinus HA. Is there anything additionally she should take?

## 2013-03-22 NOTE — Telephone Encounter (Signed)
Pt.notified

## 2013-03-22 NOTE — Telephone Encounter (Signed)
Continue with Astelin for few more days, call if not well by next week. Tingling in the left arm is not usually a side effects from Zetia. If she has other symptoms such as slurred speech, double vision , neck pain or if symptoms are severe she needs to be seen.

## 2013-04-18 ENCOUNTER — Ambulatory Visit (INDEPENDENT_AMBULATORY_CARE_PROVIDER_SITE_OTHER): Payer: Medicare Other | Admitting: Gastroenterology

## 2013-04-18 ENCOUNTER — Encounter: Payer: Self-pay | Admitting: Gastroenterology

## 2013-04-18 VITALS — BP 122/70 | HR 84 | Ht 64.5 in | Wt 205.0 lb

## 2013-04-18 DIAGNOSIS — Z1211 Encounter for screening for malignant neoplasm of colon: Secondary | ICD-10-CM

## 2013-04-18 NOTE — Progress Notes (Signed)
History of Present Illness: Pleasant 79 year old Afro-American female referred for consideration of screening colonoscopy. Last exam was 10 years ago and apparently was normal. She has no GI complaints including change of bowel habits, abdominal pain, melena or hematochezia. She feels in excellent health. Functional status is 100%.    Past Medical History  Diagnosis Date  . Diabetes mellitus   . GERD (gastroesophageal reflux disease)     pt denies  . Osteopenia   . Abnormal breast exam 2004    MMG neg (eval by surgeon neg)  . Hx SBO 2004 , 2007, 2012     exploratory surgery 9-12   . Iritis 2005  . Osteoarthritis    Past Surgical History  Procedure Laterality Date  . Abdominal hysterectomy      in the 70s, for bleeding, no cancer  . Bilateral oophorectomy      per pt  . Total knee arthroplasty Right 12/11  . Exploratory laparotomy  9-12     SBO   family history includes Breast cancer in her sister; Diabetes in her father; and Sudden death in her sister.  There is no history of Heart attack and Colon cancer. Current Outpatient Prescriptions  Medication Sig Dispense Refill  . aspirin 81 MG tablet Take 81 mg by mouth daily.        Marland Kitchen ezetimibe (ZETIA) 10 MG tablet Take 1 tablet (10 mg total) by mouth daily.  30 tablet  12  . metFORMIN (GLUCOPHAGE) 500 MG tablet Take 1 tablet (500 mg total) by mouth 2 (two) times daily with a meal.  180 tablet  3  . Multiple Vitamins-Minerals (CENTRUM SILVER PO) Take 1 capsule by mouth daily.         No current facility-administered medications for this visit.   Allergies as of 04/18/2013 - Review Complete 04/18/2013  Allergen Reaction Noted  . Zocor (simvastatin)  05/14/2012  . Lipitor (atorvastatin)  02/01/2013    reports that she has never smoked. She has never used smokeless tobacco. She reports that she does not drink alcohol or use illicit drugs.     Review of Systems: Pertinent positive and negative review of systems were noted in the  above HPI section. All other review of systems were otherwise negative.  Vital signs were reviewed in today's medical record Physical Exam: General: Well developed , well nourished, no acute distress Skin: anicteric Head: Normocephalic and atraumatic Eyes:  sclerae anicteric, EOMI Ears: Normal auditory acuity Mouth: No deformity or lesions Neck: Supple, no masses or thyromegaly Lungs: Clear throughout to auscultation Heart: Regular rate and rhythm; no murmurs, rubs or bruits Abdomen: Soft, non tender and non distended. No masses, hepatosplenomegaly or hernias noted. Normal Bowel sounds Rectal:deferred Musculoskeletal: Symmetrical with no gross deformities  Skin: No lesions on visible extremities Pulses:  Normal pulses noted Extremities: No clubbing, cyanosis, edema or deformities noted Neurological: Alert oriented x 4, grossly nonfocal Cervical Nodes:  No significant cervical adenopathy Inguinal Nodes: No significant inguinal adenopathy Psychological:  Alert and cooperative. Normal mood and affect

## 2013-04-18 NOTE — Patient Instructions (Addendum)
Please contact the office when you are ready to schedule your colonoscopy

## 2013-04-18 NOTE — Assessment & Plan Note (Addendum)
Patient has no GI complaints. I discussed colorectal cancer screening recommendations including stopping  screening routinely at age 79. I explained that I am generally more aggressive in screening inpatients who are healthy at 82 without significant comorbidities and more reticent to do so if patient has other active medical problems placeing them at increased risk for the procedure. She's in excellent health and is agreeable to having this done one more time. Accordingly, I will proceed with screening exam.

## 2013-04-29 ENCOUNTER — Other Ambulatory Visit: Payer: Self-pay | Admitting: Internal Medicine

## 2013-04-29 NOTE — Telephone Encounter (Signed)
Refill done.  

## 2013-05-16 ENCOUNTER — Telehealth: Payer: Self-pay | Admitting: Gastroenterology

## 2013-05-16 ENCOUNTER — Encounter: Payer: Self-pay | Admitting: Gastroenterology

## 2013-05-16 DIAGNOSIS — Z1211 Encounter for screening for malignant neoplasm of colon: Secondary | ICD-10-CM

## 2013-05-16 NOTE — Telephone Encounter (Signed)
Spoke with patient and scheduled recall colonoscopy on 07/22/13 at 11:00 AM. Pre visit scheduled on 07/15/13 at 1:30 PM.

## 2013-07-22 ENCOUNTER — Encounter: Payer: Medicare Other | Admitting: Gastroenterology

## 2013-08-03 ENCOUNTER — Ambulatory Visit: Payer: Medicare Other | Admitting: Internal Medicine

## 2013-08-05 ENCOUNTER — Ambulatory Visit (INDEPENDENT_AMBULATORY_CARE_PROVIDER_SITE_OTHER): Payer: Medicare Other | Admitting: Internal Medicine

## 2013-08-05 ENCOUNTER — Encounter: Payer: Self-pay | Admitting: Internal Medicine

## 2013-08-05 VITALS — BP 134/75 | HR 71 | Temp 98.7°F | Wt 206.8 lb

## 2013-08-05 DIAGNOSIS — E785 Hyperlipidemia, unspecified: Secondary | ICD-10-CM

## 2013-08-05 DIAGNOSIS — Z23 Encounter for immunization: Secondary | ICD-10-CM

## 2013-08-05 DIAGNOSIS — E119 Type 2 diabetes mellitus without complications: Secondary | ICD-10-CM

## 2013-08-05 DIAGNOSIS — M1990 Unspecified osteoarthritis, unspecified site: Secondary | ICD-10-CM

## 2013-08-05 LAB — LIPID PANEL
Cholesterol: 189 mg/dL (ref 0–200)
HDL: 53.5 mg/dL (ref >39.00)
LDL Cholesterol: 118 mg/dL — ABNORMAL HIGH (ref 0–99)
VLDL: 17.6 mg/dL (ref 0.0–40.0)

## 2013-08-05 NOTE — Assessment & Plan Note (Addendum)
On going sx , see previous entries, intolerant to many meds and have decided not to have surgery Plan: Tylenol or Aleve as needed, GI side effects discussed

## 2013-08-05 NOTE — Assessment & Plan Note (Addendum)
Based on the last cholesterol panel, we started zetia, good compliance, no side effects, cost is an issue. Plan: samples Labs Refill -or samples-  when necessary

## 2013-08-05 NOTE — Patient Instructions (Signed)
For pain: Tylenol  500 mg OTC 2 tabs a day every 8 hours as needed for pain Or Aleve OTC  as needed for pain. Always take it with food because may cause gastritis and ulcers. If you notice nausea, stomach pain, change in the color of stools --->  Stop the medicine and let us know

## 2013-08-05 NOTE — Progress Notes (Signed)
  Subjective:    Patient ID: [name], female    DOB: 02/16/1930, 79 y.o.   MRN: [id number]  HPI Routine office visit High cholesterol, good compliance with zetia  cost is an issue Diabetes, good compliance with metformin, no apparent side effects. I offered a flu shot, we'll get one today  Past Medical History  Diagnosis Date  . Diabetes mellitus   . GERD (gastroesophageal reflux disease)     pt denies  . Osteopenia   . Abnormal breast exam 2004    MMG neg (eval by surgeon neg)  . Hx SBO 2004 , 2007, 2012     exploratory surgery 9-12   . Iritis 2005  . Osteoarthritis    Past Surgical History  Procedure Laterality Date  . Abdominal hysterectomy      in the 70s, for bleeding, no cancer  . Bilateral oophorectomy      per pt  . Total knee arthroplasty Right 12/11  . Exploratory laparotomy  9-12     SBO   History   Social History  . Marital Status: Widowed    Spouse Name: N/A    Number of Children: 2  . Years of Education: N/A   Occupational History  . Has her own business     makes aprons    Social History Main Topics  . Smoking status: Never Smoker   . Smokeless tobacco: Never Used  . Alcohol Use: No  . Drug Use: No  . Sexual Activity: Not on file   Other Topics Concern  . Not on file   Social History Narrative   Lives by herself, her family is in [place], lost 2 sisters              Review of Systems Vision is normal No LE  paresthesias CBGs are checked from time to time and usually within a good range, no readings available today. Continue with DJD pain, see assessment and plan.    Objective:   Physical Exam BP 134/75  Pulse 71  Temp(Src) 98.7 F (37.1 C)  Wt 206 lb 12.8 oz (93.804 kg)  BMI 34.96 kg/m2  SpO2 99% General -- alert, well-developed, NAD.   Lungs -- normal respiratory effort, no intercostal retractions, no accessory muscle use, and normal breath sounds.  Heart-- normal rate, regular rhythm, no murmur.   Extremities-- no  pretibial edema bilaterally   Psych-- No anxious appearing , no depressed appearing.      Assessment & Plan:

## 2013-08-05 NOTE — Assessment & Plan Note (Signed)
Good med compliance, labs.  Diet and exercise discussed

## 2013-08-06 ENCOUNTER — Encounter: Payer: Self-pay | Admitting: Internal Medicine

## 2013-08-08 ENCOUNTER — Encounter: Payer: Self-pay | Admitting: *Deleted

## 2013-11-16 ENCOUNTER — Ambulatory Visit (INDEPENDENT_AMBULATORY_CARE_PROVIDER_SITE_OTHER): Payer: Medicare Other | Admitting: Internal Medicine

## 2013-11-16 ENCOUNTER — Encounter: Payer: Self-pay | Admitting: Internal Medicine

## 2013-11-16 VITALS — BP 143/77 | HR 110 | Temp 99.0°F | Wt 195.0 lb

## 2013-11-16 DIAGNOSIS — J019 Acute sinusitis, unspecified: Secondary | ICD-10-CM

## 2013-11-16 DIAGNOSIS — E785 Hyperlipidemia, unspecified: Secondary | ICD-10-CM

## 2013-11-16 MED ORDER — AMOXICILLIN 500 MG PO CAPS: 1000.0000 mg | ORAL_CAPSULE | Freq: Two times a day (BID) | ORAL | Status: DC

## 2013-11-16 NOTE — Patient Instructions (Signed)
Rest, drink plenty of clear fluids Take tylenol as needed if fever or aches  For cough, take Mucinex DM twice a day as needed  For congestion use OTC Nasocort: 2 nasal sprays on each side of the nose daily until you feel better  Take the antibiotic as prescribed  (Amoxicillin) Call if no better in few days Call anytime if the symptoms are severe, you have a severe headache, chest pain, difficulty breathing

## 2013-11-16 NOTE — Assessment & Plan Note (Signed)
States she quit taking Zetia "I can't tolerate it" Will discuss on RTC

## 2013-11-16 NOTE — Progress Notes (Signed)
   Subjective:    Patient ID: [name], female    DOB: May 01, 1930, 80 y.o.   MRN: [id number]  HPI Acute visit. Symptoms started approximately 3 days ago. Sinus pain and congestion, taking OTCs Dizziness described as feeling " unsteady on her feet" when she walks, no dizziness at rest, not spinning per se. Feeling weak in general and  having sore throat.  Past Medical History  Diagnosis Date  . Diabetes mellitus   . GERD (gastroesophageal reflux disease)     pt denies  . Osteopenia   . Abnormal breast exam 2004    MMG neg (eval by surgeon neg)  . Hx SBO 2004 , 2007, 2012     exploratory surgery 9-12   . Iritis 2005  . Osteoarthritis    Past Surgical History  Procedure Laterality Date  . Abdominal hysterectomy      in the 70s, for bleeding, no cancer  . Bilateral oophorectomy      per pt  . Total knee arthroplasty Right 12/11  . Exploratory laparotomy  9-12     SBO   History   Social History  . Marital Status: Widowed    Spouse Name: N/A    Number of Children: 2  . Years of Education: N/A   Occupational History  . Has her own business     makes aprons    Social History Main Topics  . Smoking status: Never Smoker   . Smokeless tobacco: Never Used  . Alcohol Use: No  . Drug Use: No  . Sexual Activity: Not on file   Other Topics Concern  . Not on file   Social History Narrative   Lives by herself, her family is in [place], lost 2 sisters                Review of Systems Denies fever or chills Had mild nausea with the onset of symptoms, no vomiting or diarrhea. No myalgias + Global headache, not intense and not  the worst of her life. + Dry cough without chest congestion. She admits to poor appetite, not drinking enough fluids.    Objective:   Physical Exam BP 143/77  Pulse 110  Temp(Src) 99 F (37.2 C)  Wt 195 lb (88.451 kg)  SpO2 98% General -- alert, well-developed, Slightly tachycardic but not toxic appearing  Neck --Full range of  motion HEENT-- Not pale. TMs normal, throat symmetric.Membranes slightly dry, no pharyngeal redness or discharge. Face symmetric, sinuses  tender to palpation. Nose congested.  Lungs -- normal respiratory effort, no intercostal retractions, no accessory muscle use, and normal breath sounds.  Heart-- slt tachycardic, no murmur.   Extremities-- no pretibial edema bilaterally  Neurologic--  alert & oriented X3. Speech normal, gait : slow but steady, strength normal in all extremities.  EOMI, PERLA   Psych-- Cognition and judgment appear intact. Cooperative with normal attention span and concentration. No anxious or depressed appearing.      Assessment & Plan:  Acute sinusitis, Symptoms likely due to acute sinusitis, she is slightly dizzy and  may be slightly dehydrated. Recommend rest, drink plenty of fluids, see instructions. Definitely needs to call if she's not improving soon.

## 2013-11-16 NOTE — Progress Notes (Signed)
Pre visit review using our clinic review tool, if applicable. No additional management support is needed unless otherwise documented below in the visit note. 

## 2013-12-08 ENCOUNTER — Emergency Department (HOSPITAL_COMMUNITY): Payer: Medicare Other

## 2013-12-08 ENCOUNTER — Emergency Department (HOSPITAL_COMMUNITY)
Admission: EM | Admit: 2013-12-08 | Discharge: 2013-12-08 | Disposition: A | Discharge status: Home / Self Care | Payer: Medicare Other | Attending: Emergency Medicine | Admitting: Emergency Medicine

## 2013-12-08 ENCOUNTER — Encounter (HOSPITAL_COMMUNITY): Payer: Self-pay | Admitting: Emergency Medicine

## 2013-12-08 DIAGNOSIS — M1990 Unspecified osteoarthritis, unspecified site: Secondary | ICD-10-CM | POA: Insufficient documentation

## 2013-12-08 DIAGNOSIS — E119 Type 2 diabetes mellitus without complications: Secondary | ICD-10-CM | POA: Insufficient documentation

## 2013-12-08 DIAGNOSIS — K529 Noninfective gastroenteritis and colitis, unspecified: Secondary | ICD-10-CM

## 2013-12-08 DIAGNOSIS — K5289 Other specified noninfective gastroenteritis and colitis: Secondary | ICD-10-CM | POA: Insufficient documentation

## 2013-12-08 DIAGNOSIS — R109 Unspecified abdominal pain: Secondary | ICD-10-CM | POA: Insufficient documentation

## 2013-12-08 DIAGNOSIS — Z7982 Long term (current) use of aspirin: Secondary | ICD-10-CM | POA: Insufficient documentation

## 2013-12-08 DIAGNOSIS — Z8669 Personal history of other diseases of the nervous system and sense organs: Secondary | ICD-10-CM | POA: Insufficient documentation

## 2013-12-08 DIAGNOSIS — Z79899 Other long term (current) drug therapy: Secondary | ICD-10-CM | POA: Insufficient documentation

## 2013-12-08 LAB — CBC
HCT: 36.3 % (ref 36.0–46.0)
Hemoglobin: 12.1 g/dL (ref 12.0–15.0)
MCH: 27.6 pg (ref 26.0–34.0)
MCHC: 33.3 g/dL (ref 30.0–36.0)
MCV: 82.9 fL (ref 78.0–100.0)
PLATELETS: 213 10*3/uL (ref 150–400)
RBC: 4.38 MIL/uL (ref 3.87–5.11)
RDW: 14 % (ref 11.5–15.5)
WBC: 12.3 10*3/uL — AB (ref 4.0–10.5)

## 2013-12-08 LAB — URINALYSIS, ROUTINE W REFLEX MICROSCOPIC
Bilirubin Urine: NEGATIVE
GLUCOSE, UA: NEGATIVE mg/dL
HGB URINE DIPSTICK: NEGATIVE
KETONES UR: NEGATIVE mg/dL
Nitrite: NEGATIVE
Protein, ur: NEGATIVE mg/dL
Specific Gravity, Urine: 1.005 (ref 1.005–1.030)
Urobilinogen, UA: 0.2 mg/dL (ref 0.0–1.0)
pH: 6.5 (ref 5.0–8.0)

## 2013-12-08 LAB — COMPREHENSIVE METABOLIC PANEL
ALBUMIN: 3.9 g/dL (ref 3.5–5.2)
ALK PHOS: 61 U/L (ref 39–117)
ALT: 15 U/L (ref 0–35)
AST: 17 U/L (ref 0–37)
BUN: 12 mg/dL (ref 6–23)
CO2: 24 mEq/L (ref 19–32)
Calcium: 9.5 mg/dL (ref 8.4–10.5)
Chloride: 102 mEq/L (ref 96–112)
Creatinine, Ser: 0.66 mg/dL (ref 0.50–1.10)
GFR calc Af Amer: >90 mL/min (ref >90)
GFR calc non Af Amer: 81 mL/min — ABNORMAL LOW (ref >90)
Glucose, Bld: 165 mg/dL — ABNORMAL HIGH (ref 70–99)
POTASSIUM: 4.6 meq/L (ref 3.7–5.3)
SODIUM: 141 meq/L (ref 137–147)
TOTAL PROTEIN: 8.1 g/dL (ref 6.0–8.3)
Total Bilirubin: 0.4 mg/dL (ref 0.3–1.2)

## 2013-12-08 LAB — URINE MICROSCOPIC-ADD ON

## 2013-12-08 MED ORDER — SODIUM CHLORIDE 0.9 % IV SOLN: INTRAVENOUS | Status: DC

## 2013-12-08 MED ORDER — IOHEXOL 300 MG/ML  SOLN
100.0000 mL | Freq: Once | INTRAMUSCULAR | Status: AC | PRN
  Administered 2013-12-08: 100 mL via INTRAVENOUS

## 2013-12-08 NOTE — Discharge Instructions (Signed)
Drink plenty of fluids. Slowly advance your diet to normal. Recheck if you get worse such as get a fever, your pain gets worse, you start having uncontrolled diarrhea or vomiting. You can talk to Dr Deatra Ina, the gastroenterologist you saw last summer if your symptoms persist.

## 2013-12-08 NOTE — ED Provider Notes (Signed)
Patient left the changes shift to get CT results. Patient has history of bowel blockage in the past. She states she's had pain on the right side without diarrhea. She states she induced vomiting 3 times thinking it would make her pain better. On review of her chart she saw Dr. Deatra Ina, gastroenterologist and June of 2014 to consider colonoscopy. Patient reports she just finished a ten-day course of amoxicillin for a sinus infection. She reports that has improved. Patient states she's feeling much better. We discussed her CT results. She wants to be discharged. She does not want any pain or nausea medicine.  Ct Abdomen Pelvis W Contrast  12/08/2013   CLINICAL DATA:  Abdominal pain. Diarrhea. Evaluate for small bowel obstruction.  EXAM: CT ABDOMEN AND PELVIS WITH CONTRAST  TECHNIQUE: Multidetector CT imaging of the abdomen and pelvis was performed using the standard protocol following bolus administration of intravenous contrast.  CONTRAST:  161mL OMNIPAQUE IOHEXOL 300 MG/ML  SOLN  COMPARISON:  CT of the abdomen and pelvis 07/18/2011.  FINDINGS: Lung Bases: Mild scarring in the lower lobes of the lungs bilaterally. 7 mm subpleural nodule in the lateral segment of the right middle lobe (image 6 of series 3). 6 mm subpleural nodule in knee lingula (image 9 of series 3). Atherosclerotic calcifications in the left anterior descending, left circumflex and right coronary arteries.  Abdomen/Pelvis: The appearance of the liver, gallbladder, spleen, bilateral adrenal glands and the right kidney is unremarkable. Multiple subcentimeter low attenuation lesions in the left kidney are too small to definitively characterize, but are similar to the prior examination and favored to represent tiny cysts. Focal area of fatty atrophy in the mid body of the pancreas is unchanged compared to prior examinations, and may relate to prior pancreatitis. No signs of active inflammation in the pancreas are noted on today's examination. Mild  atherosclerosis throughout the abdominal and pelvic vasculature, without evidence of aneurysm or dissection.  In the right side of the abdomen there is a cluster of small bowel that is likely distal jejunal or proximal ileal, which demonstrates multifocal bowel wall thickening, best demonstrated on axial images 51 and 52 of series 2, and coronal image 54 of series 80440. There is some haziness in the associated small bowel mesenteries. Arterial and venous supply of this region of the small bowel appears grossly intact on today's non CTA examination. No definite pathologic dilatation of small bowel. No pneumatosis in this portion of the small bowel. No pneumoperitoneum. No significant volume of ascites. Status post hysterectomy. Ovaries are not confidently identified and may be surgically absent or atrophic.  Musculoskeletal: There are no aggressive appearing lytic or blastic lesions noted in the visualized portions of the skeleton.  IMPRESSION: 1. Thickening and inflammatory changes of loops of small bowel in the right side of the abdomen, predominantly distal jejunal and proximal ileal loops, favored to reflect a small bowel enteritis. No signs of bowel obstruction are appreciated at this time. 2. Atherosclerosis, including 3 vessel coronary artery disease. 3. Two small subpleural nodules in the lung bases, largest of which measures 7 mm in the lateral segment of the right middle lobe. If the patient is at high risk for bronchogenic carcinoma, follow-up chest CT at 3-63months is recommended. If the patient is at low risk for bronchogenic carcinoma, follow-up chest CT at 6-12 months is recommended. This recommendation follows the consensus statement: Guidelines for Management of Small Pulmonary Nodules Detected on CT Scans: A Statement from the [employer] as published in Radiology  2005; 841:324-401. 4. Additional incidental findings, as above.   Electronically Signed   By: Vinnie Langton M.D.   On:  12/08/2013 07:47   Dg Abd Acute W/chest  12/08/2013   CLINICAL DATA:  Abdominal pain.  EXAM: ACUTE ABDOMEN SERIES (ABDOMEN 2 VIEW & CHEST 1 VIEW)  COMPARISON:  07/19/2003  FINDINGS: There is no evidence of dilated bowel loops or free intraperitoneal air. No radiopaque renal calculi or other significant radiographic abnormality is seen. Heart size and mediastinal contours are within normal limits. Both lungs are clear. No effusion or pneumothorax. Tortuous thoracic aorta.  IMPRESSION: Negative abdominal radiographs.  No acute cardiopulmonary disease.   Electronically Signed   By: Jorje Guild M.D.   On: 12/08/2013 03:37    Final diagnoses:  Abdominal pain  Enteritis     Plan discharge   Rolland Porter, MD, Alanson Aly, MD 12/08/13 [phone]

## 2013-12-08 NOTE — ED Notes (Addendum)
Pt requesting not to have an PIV or IV fluids.

## 2013-12-08 NOTE — ED Notes (Signed)
Patient presents with c/o abd pain.  States she is afraid that she is starting up with another bowel obstruction.  Abd distended, last BM was on Tuesday (not norma, small amount)

## 2013-12-08 NOTE — ED Notes (Signed)
Patient transported to CT 

## 2013-12-08 NOTE — ED Notes (Signed)
Updated family; family member Psychiatrist of ED's phone number; number provided, pt does not want to speak to charge at this time

## 2013-12-08 NOTE — ED Notes (Signed)
CT called to inform of patient having completed oral contrast dose.

## 2013-12-08 NOTE — ED Provider Notes (Signed)
CSN: [id number]     Arrival date & time 12/08/13  0014 History   First MD Initiated Contact with Patient 12/08/13 [phone]     Chief Complaint  Patient presents with  . Abdominal Pain   (Consider location/radiation/quality/duration/timing/severity/associated sxs/prior Treatment) Patient is a 80 y.o. female presenting with abdominal pain. The history is provided by the patient.  Abdominal Pain Associated symptoms: nausea and vomiting   Associated symptoms: no chest pain, no chills, no cough, no dysuria, no fever, no shortness of breath and no sore throat   pt c/o mid abd pain and cramping onset this evening at approximately 6 pm. Dull. Moderate. +nv, emesis of recent ingested food/spinach. No bloody emesis. Did have small bm yesterday. No abd distension. States hx sbo, last of which was 2012. Prior abd surgery includes hysterectomy, and exp lap/loa. No dysuria or flank pain. No vaginal discharge or bleeding. No rectal bleeding. No fever or chills.     Past Medical History  Diagnosis Date  . Diabetes mellitus   . GERD (gastroesophageal reflux disease)     pt denies  . Osteopenia   . Abnormal breast exam 2004    MMG neg (eval by surgeon neg)  . Hx SBO 2004 , 2007, 2012     exploratory surgery 9-12   . Iritis 2005  . Osteoarthritis    Past Surgical History  Procedure Laterality Date  . Abdominal hysterectomy      in the 70s, for bleeding, no cancer  . Bilateral oophorectomy      per pt  . Total knee arthroplasty Right 12/11  . Exploratory laparotomy  9-12     SBO   Family History  Problem Relation Age of Onset  . Diabetes Father   . Heart attack Neg Hx   . Breast cancer Sister   . Sudden death Sister   . Colon cancer Neg Hx    History  Substance Use Topics  . Smoking status: Never Smoker   . Smokeless tobacco: Never Used  . Alcohol Use: No   OB History   Grav Para Term Preterm Abortions TAB SAB Ect Mult Living                 Review of Systems  Constitutional:  Negative for fever and chills.  HENT: Negative for sore throat.   Eyes: Negative for redness.  Respiratory: Negative for cough and shortness of breath.   Cardiovascular: Negative for chest pain.  Gastrointestinal: Positive for nausea, vomiting and abdominal pain.  Genitourinary: Negative for dysuria and flank pain.  Musculoskeletal: Negative for back pain and neck pain.  Skin: Negative for rash.  Neurological: Negative for headaches.  Hematological: Does not bruise/bleed easily.  Psychiatric/Behavioral: Negative for confusion.    Allergies  Zocor and Lipitor  Home Medications   Current Outpatient Rx  Name  Route  Sig  Dispense  Refill  . acetaminophen (TYLENOL) 650 MG CR tablet   Oral   Take 650 mg by mouth daily as needed for pain.         Marland Kitchen aspirin 81 MG tablet   Oral   Take 81 mg by mouth daily.           . metFORMIN (GLUCOPHAGE) 500 MG tablet      TAKE 1 TABLET BY MOUTH TWICE DAILY WITH A MEAL   180 tablet   1   . Multiple Vitamins-Minerals (CENTRUM SILVER PO)   Oral   Take 1 capsule by mouth daily.  BP 148/72  Pulse 81  Temp(Src) 98.2 F (36.8 C) (Oral)  Resp 18  Ht 5\' 5"  (1.651 m)  Wt 202 lb (91.627 kg)  BMI 33.61 kg/m2  SpO2 98% Physical Exam  Nursing note and vitals reviewed. Constitutional: She appears well-developed and well-nourished. No distress.  HENT:  Mouth/Throat: Oropharynx is clear and moist.  Eyes: Conjunctivae are normal. No scleral icterus.  Neck: Neck supple. No tracheal deviation present.  Cardiovascular: Normal rate, regular rhythm, normal heart sounds and intact distal pulses.   Pulmonary/Chest: Effort normal and breath sounds normal. No respiratory distress.  Abdominal: Soft. Normal appearance and bowel sounds are normal. She exhibits no distension and no mass. There is no tenderness. There is no rebound and no guarding.  Genitourinary:  No cva tenderness  Musculoskeletal: She exhibits no edema.  Neurological: She  is alert.  Skin: Skin is warm and dry. No rash noted.  Psychiatric: She has a normal mood and affect.    ED Course  Procedures (including critical care time)  Results for orders placed during the hospital encounter of 12/08/13  CBC      Result Value Range   WBC 12.3 (*) 4.0 - 10.5 K/uL   RBC 4.38  3.87 - 5.11 MIL/uL   Hemoglobin 12.1  12.0 - 15.0 g/dL   HCT 36.3  36.0 - 46.0 %   MCV 82.9  78.0 - 100.0 fL   MCH 27.6  26.0 - 34.0 pg   MCHC 33.3  30.0 - 36.0 g/dL   RDW 14.0  11.5 - 15.5 %   Platelets 213  150 - 400 K/uL  COMPREHENSIVE METABOLIC PANEL      Result Value Range   Sodium 141  137 - 147 mEq/L   Potassium 4.6  3.7 - 5.3 mEq/L   Chloride 102  96 - 112 mEq/L   CO2 24  19 - 32 mEq/L   Glucose, Bld 165 (*) 70 - 99 mg/dL   BUN 12  6 - 23 mg/dL   Creatinine, Ser 0.66  0.50 - 1.10 mg/dL   Calcium 9.5  8.4 - 10.5 mg/dL   Total Protein 8.1  6.0 - 8.3 g/dL   Albumin 3.9  3.5 - 5.2 g/dL   AST 17  0 - 37 U/L   ALT 15  0 - 35 U/L   Alkaline Phosphatase 61  39 - 117 U/L   Total Bilirubin 0.4  0.3 - 1.2 mg/dL   GFR calc non Af Amer 81 (*) >90 mL/min   GFR calc Af Amer >90  >90 mL/min  URINALYSIS, ROUTINE W REFLEX MICROSCOPIC      Result Value Range   Color, Urine YELLOW  YELLOW   APPearance CLEAR  CLEAR   Specific Gravity, Urine 1.005  1.005 - 1.030   pH 6.5  5.0 - 8.0   Glucose, UA NEGATIVE  NEGATIVE mg/dL   Hgb urine dipstick NEGATIVE  NEGATIVE   Bilirubin Urine NEGATIVE  NEGATIVE   Ketones, ur NEGATIVE  NEGATIVE mg/dL   Protein, ur NEGATIVE  NEGATIVE mg/dL   Urobilinogen, UA 0.2  0.0 - 1.0 mg/dL   Nitrite NEGATIVE  NEGATIVE   Leukocytes, UA SMALL (*) NEGATIVE  URINE MICROSCOPIC-ADD ON      Result Value Range   Squamous Epithelial / LPF FEW (*) RARE   WBC, UA 3-6  <3 WBC/hpf   Bacteria, UA RARE  RARE   Dg Abd Acute W/chest  12/08/2013   CLINICAL DATA:  Abdominal pain.  EXAM: ACUTE ABDOMEN SERIES (ABDOMEN 2 VIEW & CHEST 1 VIEW)  COMPARISON:  07/19/2003  FINDINGS:  There is no evidence of dilated bowel loops or free intraperitoneal air. No radiopaque renal calculi or other significant radiographic abnormality is seen. Heart size and mediastinal contours are within normal limits. Both lungs are clear. No effusion or pneumothorax. Tortuous thoracic aorta.  IMPRESSION: Negative abdominal radiographs.  No acute cardiopulmonary disease.   Electronically Signed   By: Tiburcio PeaJonathan  Watts M.D.   On: 12/08/2013 03:37     EKG Interpretation   None       MDM  Iv ns bolus. zofran iv. Labs. Xr.  Reviewed nursing notes and prior charts for additional history.   Recheck pt, persistently uncomfortable. No focal abd pain or focal tenderness. Nausea.  Ct ordered.   Recheck ct pending, signed out to Dr Lynelle DoctorKnapp to check ct when back.  If ct neg acute, anticipate d/c to home.  If ct concerning for sbo or other acute process, admission.   Recheck pt comfortable.    Suzi RootsKevin E Kweku Stankey, MD 12/08/13 [phone]

## 2013-12-08 NOTE — ED Notes (Signed)
Dr.Knapp at bedside  

## 2013-12-09 LAB — URINE CULTURE

## 2014-01-10 ENCOUNTER — Ambulatory Visit (INDEPENDENT_AMBULATORY_CARE_PROVIDER_SITE_OTHER): Payer: Medicare Other | Admitting: Gastroenterology

## 2014-01-10 ENCOUNTER — Encounter: Payer: Self-pay | Admitting: Gastroenterology

## 2014-01-10 VITALS — BP 110/64 | HR 88 | Ht 64.5 in | Wt 203.1 lb

## 2014-01-10 DIAGNOSIS — K5289 Other specified noninfective gastroenteritis and colitis: Secondary | ICD-10-CM

## 2014-01-10 DIAGNOSIS — K529 Noninfective gastroenteritis and colitis, unspecified: Secondary | ICD-10-CM | POA: Insufficient documentation

## 2014-01-10 DIAGNOSIS — Z1211 Encounter for screening for malignant neoplasm of colon: Secondary | ICD-10-CM

## 2014-01-10 NOTE — Assessment & Plan Note (Signed)
Last colonoscopy 2004.  In view of patient's age I will opt for cologuard instead of colonoscopy

## 2014-01-10 NOTE — Assessment & Plan Note (Signed)
Episode of nausea, vomiting, and abdominal pain and diarrhea was probably due to a viral gastroenteritis.  This has clinically resolved

## 2014-01-10 NOTE — Patient Instructions (Signed)
We have had you sign a Cologuard test form  You will be mailed a Cologuard test kit

## 2014-01-10 NOTE — Progress Notes (Signed)
          History of Present Illness:  The patient has returned following recent EGD evaluation for nausea, vomiting and diarrhea.  She was seen about one month ago for acute onset of the above symptoms and crampy lower abdominal pain.  Evaluation was pertinent for a CT scan that showed thickening of some small bowel loops in the distal jejunum and proximal ileum suggestive of an acute enteritis.  Symptoms subsided after approximately 12 hours and she was discharged.  She has felt well since that time.   Review of Systems: Pertinent positive and negative review of systems were noted in the above HPI section. All other review of systems were otherwise negative.    Current Medications, Allergies, Past Medical History, Past Surgical History, Family History and Social History were reviewed in [facility] record  Vital signs were reviewed in today's medical record. Physical Exam: General: Well developed , well nourished, no acute distress Skin: anicteric Head: Normocephalic and atraumatic Eyes:  sclerae anicteric, EOMI Ears: Normal auditory acuity Mouth: No deformity or lesions Lungs: Clear throughout to auscultation Heart: Regular rate and rhythm; no murmurs, rubs or bruits Abdomen: Soft, non tender and non distended. No masses, hepatosplenomegaly or hernias noted. Normal Bowel sounds Rectal:deferred Musculoskeletal: Symmetrical with no gross deformities  Pulses:  Normal pulses noted Extremities: No clubbing, cyanosis, edema or deformities noted Neurological: Alert oriented x 4, grossly nonfocal Psychological:  Alert and cooperative. Normal mood and affect  See Assessment and Plan under Problem List

## 2014-01-24 LAB — COLOGUARD: COLOGUARD: POSITIVE

## 2014-01-27 LAB — HM DIABETES EYE EXAM

## 2014-01-31 ENCOUNTER — Ambulatory Visit: Payer: Medicare Other | Admitting: Internal Medicine

## 2014-02-01 ENCOUNTER — Telehealth: Payer: Self-pay | Admitting: *Deleted

## 2014-02-01 NOTE — Telephone Encounter (Signed)
Message copied by Oda Kilts on Wed Feb 01, 2014  5:13 PM ------      Message from: Erskine Emery D      Created: Wed Feb 01, 2014  8:44 AM      Regarding: RE: Cologaurd test       Cologuard is positive.  Please schedule colonoscopy      ----- Message -----         From: Oda Kilts, CMA         Sent: 01/31/2014   4:01 PM           To: Inda Castle, MD      Subject: Cologaurd test                                           Please see result your desk       ------

## 2014-02-01 NOTE — Telephone Encounter (Signed)
Cologaurd positive

## 2014-02-02 ENCOUNTER — Encounter: Payer: Self-pay | Admitting: *Deleted

## 2014-02-02 NOTE — Telephone Encounter (Signed)
tRIED TO CALL PT WITH APPT TIMES NO ANSWER

## 2014-02-02 NOTE — Telephone Encounter (Signed)
Patient aware the Cologaurd test is pos Colon sch 03/07/2014 at 3pm   previsit

## 2014-02-02 NOTE — Telephone Encounter (Signed)
COLON 03/07/2014 AT 3PM  PREVISIT 02/20/2014 AT 2PM

## 2014-02-03 ENCOUNTER — Encounter: Payer: Medicare Other | Admitting: Internal Medicine

## 2014-02-09 NOTE — Telephone Encounter (Signed)
Patient aware of Previsit appointment time and date and colon appt time and date

## 2014-02-13 ENCOUNTER — Encounter: Payer: Self-pay | Admitting: Internal Medicine

## 2014-02-20 ENCOUNTER — Ambulatory Visit (AMBULATORY_SURGERY_CENTER): Payer: Self-pay | Admitting: *Deleted

## 2014-02-20 ENCOUNTER — Encounter: Payer: Self-pay | Admitting: Gastroenterology

## 2014-02-20 VITALS — Ht 64.5 in | Wt 203.0 lb

## 2014-02-20 DIAGNOSIS — Z1211 Encounter for screening for malignant neoplasm of colon: Secondary | ICD-10-CM

## 2014-02-20 MED ORDER — NA SULFATE-K SULFATE-MG SULF 17.5-3.13-1.6 GM/177ML PO SOLN: ORAL | Status: DC

## 2014-02-20 NOTE — Progress Notes (Signed)
Patient denies any allergies to eggs or soy. Patient denies any problems with anesthesia. No oxygen use at home. No diet pills. Emmi information given to patient on colonoscopy.

## 2014-02-24 ENCOUNTER — Encounter: Payer: Self-pay | Admitting: Gastroenterology

## 2014-03-07 ENCOUNTER — Ambulatory Visit (AMBULATORY_SURGERY_CENTER): Payer: Medicare Other | Admitting: Gastroenterology

## 2014-03-07 ENCOUNTER — Encounter: Payer: Self-pay | Admitting: Gastroenterology

## 2014-03-07 VITALS — BP 130/77 | HR 67 | Temp 98.0°F | Resp 19 | Ht 64.5 in | Wt 203.0 lb

## 2014-03-07 DIAGNOSIS — R195 Other fecal abnormalities: Secondary | ICD-10-CM

## 2014-03-07 DIAGNOSIS — Z1211 Encounter for screening for malignant neoplasm of colon: Secondary | ICD-10-CM

## 2014-03-07 DIAGNOSIS — K5730 Diverticulosis of large intestine without perforation or abscess without bleeding: Secondary | ICD-10-CM

## 2014-03-07 LAB — GLUCOSE, CAPILLARY
Glucose-Capillary: 89 mg/dL (ref 70–99)
Glucose-Capillary: 128 mg/dL — ABNORMAL HIGH (ref 70–99)

## 2014-03-07 MED ORDER — SODIUM CHLORIDE 0.9 % IV SOLN: 500.0000 mL | INTRAVENOUS | Status: DC

## 2014-03-07 NOTE — Progress Notes (Signed)
  [facility] Post-op Note  Patient: Medora Roorda  Procedure(s) Performed: colonoscopy  Patient Location: [facility] - Recovery Area  Anesthesia Type: Deep Sedation/Propofol  Level of Consciousness: awake, oriented and patient cooperative  Airway and Oxygen Therapy: Patient Spontanous Breathing  Post-op Pain: none  Post-op Assessment:  Post-op Vital signs reviewed, Patient's Cardiovascular Status Stable, Respiratory Function Stable, Patent Airway, No signs of Nausea or vomiting and Pain level controlled  Post-op Vital Signs: Reviewed and stable  Complications: No apparent anesthesia complications  Verlin Grills 3:39 PM

## 2014-03-07 NOTE — Patient Instructions (Signed)
Discharge instructions given with verbal understanding. Handouts on diverticulosis and a high fiber diet. Resume previous medications. YOU HAD AN ENDOSCOPIC PROCEDURE TODAY AT THE [facility]: Refer to the procedure report that was given to you for any specific questions about what was found during the examination.  If the procedure report does not answer your questions, please call your gastroenterologist to clarify.  If you requested that your care partner not be given the details of your procedure findings, then the procedure report has been included in a sealed envelope for you to review at your convenience later.  YOU SHOULD EXPECT: Some feelings of bloating in the abdomen. Passage of more gas than usual.  Walking can help get rid of the air that was put into your GI tract during the procedure and reduce the bloating. If you had a lower endoscopy (such as a colonoscopy or flexible sigmoidoscopy) you may notice spotting of blood in your stool or on the toilet paper. If you underwent a bowel prep for your procedure, then you may not have a normal bowel movement for a few days.  DIET: Your first meal following the procedure should be a light meal and then it is ok to progress to your normal diet.  A half-sandwich or bowl of soup is an example of a good first meal.  Heavy or fried foods are harder to digest and may make you feel nauseous or bloated.  Likewise meals heavy in dairy and vegetables can cause extra gas to form and this can also increase the bloating.  Drink plenty of fluids but you should avoid alcoholic beverages for 24 hours.  ACTIVITY: Your care partner should take you home directly after the procedure.  You should plan to take it easy, moving slowly for the rest of the day.  You can resume normal activity the day after the procedure however you should NOT DRIVE or use heavy machinery for 24 hours (because of the sedation medicines used during the test).    SYMPTOMS TO REPORT  IMMEDIATELY: A gastroenterologist can be reached at any hour.  During normal business hours, 8:30 AM to 5:00 PM Monday through Friday, call [phone].  After hours and on weekends, please call the GI answering service at [phone] who will take a message and have the physician on call contact you.   Following lower endoscopy (colonoscopy or flexible sigmoidoscopy):  Excessive amounts of blood in the stool  Significant tenderness or worsening of abdominal pains  Swelling of the abdomen that is new, acute  Fever of 100F or higher FOLLOW UP: If any biopsies were taken you will be contacted by phone or by letter within the next 1-3 weeks.  Call your gastroenterologist if you have not heard about the biopsies in 3 weeks.  Our staff will call the home number listed on your records the next business day following your procedure to check on you and address any questions or concerns that you may have at that time regarding the information given to you following your procedure. This is a courtesy call and so if there is no answer at the home number and we have not heard from you through the emergency physician on call, we will assume that you have returned to your regular daily activities without incident.  SIGNATURES/CONFIDENTIALITY: You and/or your care partner have signed paperwork which will be entered into your electronic medical record.  These signatures attest to the fact that that the information above on your After   Visit Summary has been reviewed and is understood.  Full responsibility of the confidentiality of this discharge information lies with you and/or your care-partner. 

## 2014-03-07 NOTE — Op Note (Signed)
[facility]  [address]. [place], [address]   COLONOSCOPY PROCEDURE REPORT  PATIENT: Brita, Jurgensen  MR#: [id number] BIRTHDATE: Sep 19, 1930 , 83  yrs. old GENDER: Female ENDOSCOPIST: Inda Castle, MD REFERRED BY: PROCEDURE DATE:  03/07/2014 PROCEDURE:   Colonoscopy, diagnostic First Screening Colonoscopy - Avg.  risk and is 50 yrs.  old or older - No.  Prior Negative Screening - Now for repeat screening. 10 or more years since last screening  History of Adenoma - Now for follow-up colonoscopy & has been > or = to 3 yrs.  N/A  Polyps Removed Today? No.  Recommend repeat exam, <10 yrs? No. ASA CLASS:   Class II INDICATIONS:heme-positive stool. MEDICATIONS: MAC sedation, administered by CRNA and propofol (Diprivan) 150mg  IV  DESCRIPTION OF PROCEDURE:   After the risks benefits and alternatives of the procedure were thoroughly explained, informed consent was obtained.  A digital rectal exam revealed no abnormalities of the rectum.   The LB [id number]  endoscope was introduced through the anus and advanced to the cecum, which was identified by both the appendix and ileocecal valve. No adverse events experienced.   The quality of the prep was Suprep good  The instrument was then slowly withdrawn as the colon was fully examined.      COLON FINDINGS: Moderate diverticulosis was noted in the sigmoid colon.   The colon was otherwise normal.  There was no diverticulosis, inflammation, polyps or cancers unless previously stated.  Retroflexed views revealed no abnormalities. The time to cecum=2 minutes 44 seconds.  Withdrawal time=7 minutes 10 seconds. The scope was withdrawn and the procedure completed. COMPLICATIONS: There were no complications.  ENDOSCOPIC IMPRESSION: 1.   Moderate diverticulosis was noted in the sigmoid colon 2.   The colon was otherwise normal  RECOMMENDATIONS: Given your age, you will not need another colonoscopy for colon cancer  screening or polyp surveillance.  These types of tests usually stop around the age 30. Cologuard test 3 years   eSigned:  Inda Castle, MD 03/07/2014 3:37 PM   cc: Kathlene November, MD   PATIENT NAME:  Taylor, Theresa MR#: [id number]

## 2014-03-08 ENCOUNTER — Telehealth: Payer: Self-pay | Admitting: *Deleted

## 2014-03-08 NOTE — Telephone Encounter (Signed)
No answer, left message to call if questions or concerns. 

## 2014-03-25 ENCOUNTER — Other Ambulatory Visit: Payer: Self-pay | Admitting: Internal Medicine

## 2014-04-27 ENCOUNTER — Telehealth: Payer: Self-pay | Admitting: *Deleted

## 2014-04-27 DIAGNOSIS — L989 Disorder of the skin and subcutaneous tissue, unspecified: Secondary | ICD-10-CM

## 2014-04-27 DIAGNOSIS — D229 Melanocytic nevi, unspecified: Secondary | ICD-10-CM

## 2014-04-27 NOTE — Telephone Encounter (Signed)
Pt states for skin lesion on her back and mole removal. Referral Ordered

## 2014-04-27 NOTE — Addendum Note (Signed)
Addended by: Peggyann Shoals on: 04/27/2014 04:30 PM   Modules accepted: Orders

## 2014-04-27 NOTE — Telephone Encounter (Signed)
Pt requesting referral to dermatologist okay to order? DX

## 2014-04-27 NOTE — Telephone Encounter (Signed)
Recommend Dr Allyson Sabal dermatology office

## 2014-04-27 NOTE — Telephone Encounter (Signed)
Ask pt about the Dx , ok to refer b( although if the pt desires to see me first that is ok as well )

## 2014-04-27 NOTE — Telephone Encounter (Signed)
Pt notified. Done.

## 2014-04-27 NOTE — Telephone Encounter (Signed)
Caller name:  Edie Relation to pt:  self Call back number: [phone]   Pharmacy:  Reason for call:   Pt called asking for a recommendation for a dermatologist.   Please text to number above if possible.

## 2014-05-10 ENCOUNTER — Telehealth: Payer: Self-pay | Admitting: *Deleted

## 2014-05-10 ENCOUNTER — Encounter: Payer: Self-pay | Admitting: *Deleted

## 2014-05-10 NOTE — Telephone Encounter (Addendum)
Medication List and Allergies: Reviewed and updated  48 Day supply/Mail order:N/A Local pharmacy: [facility] [place] Immunizations Due: Prevnar  A/P FH/PSH or Personal History: Reviewed and updated Flu Vaccine: 08/2013 Tdap: 11/2007 PNA: (pna 23) 01/2009 Shingles: 02/2013 CCS:03/2014 Diverticulosis, no need to repeat due to age and results  MMG: 06/2012 neg bi-rads BD: 04/2010 normal  To discuss with provider: Not at this time

## 2014-05-10 NOTE — Telephone Encounter (Signed)
Patient called back to return your phone call.

## 2014-05-10 NOTE — Telephone Encounter (Deleted)
LMOVM to return call regarding CPE tomorrow.

## 2014-05-11 ENCOUNTER — Encounter: Payer: Self-pay | Admitting: Internal Medicine

## 2014-05-11 ENCOUNTER — Ambulatory Visit (INDEPENDENT_AMBULATORY_CARE_PROVIDER_SITE_OTHER): Payer: Medicare Other | Admitting: Internal Medicine

## 2014-05-11 ENCOUNTER — Encounter: Payer: Medicare Other | Admitting: Internal Medicine

## 2014-05-11 VITALS — BP 129/72 | HR 78 | Temp 97.8°F | Ht 66.0 in | Wt 200.0 lb

## 2014-05-11 DIAGNOSIS — M949 Disorder of cartilage, unspecified: Secondary | ICD-10-CM

## 2014-05-11 DIAGNOSIS — E785 Hyperlipidemia, unspecified: Secondary | ICD-10-CM

## 2014-05-11 DIAGNOSIS — Z0000 Encounter for general adult medical examination without abnormal findings: Secondary | ICD-10-CM

## 2014-05-11 DIAGNOSIS — K219 Gastro-esophageal reflux disease without esophagitis: Secondary | ICD-10-CM

## 2014-05-11 DIAGNOSIS — M899 Disorder of bone, unspecified: Secondary | ICD-10-CM

## 2014-05-11 DIAGNOSIS — E119 Type 2 diabetes mellitus without complications: Secondary | ICD-10-CM

## 2014-05-11 NOTE — Assessment & Plan Note (Addendum)
Good compliance with metformin. Sees eye doctor regularly, feet exam normal today. Plan: A1c, microalbumin

## 2014-05-11 NOTE — Assessment & Plan Note (Signed)
asx on no meds

## 2014-05-11 NOTE — Patient Instructions (Signed)
come back fasting for labs: A1c and microalbumin --- dx  diabetes FLP --- dx yperlipidemia CBC --- dx history of anemia  Next visit in 6 months, no fasting    Fall Prevention and Home Safety Falls cause injuries and can affect all age groups. It is possible to use preventive measures to significantly decrease the likelihood of falls. There are many simple measures which can make your home safer and prevent falls. OUTDOORS  Repair cracks and edges of walkways and driveways.  Remove high doorway thresholds.  Trim shrubbery on the main path into your home.  Have good outside lighting.  Clear walkways of tools, rocks, debris, and clutter.  Check that handrails are not broken and are securely fastened. Both sides of steps should have handrails.  Have leaves, snow, and ice cleared regularly.  Use sand or salt on walkways during winter months.  In the garage, clean up grease or oil spills. BATHROOM  Install night lights.  Install grab bars by the toilet and in the tub and shower.  Use non-skid mats or decals in the tub or shower.  Place a plastic non-slip stool in the shower to sit on, if needed.  Keep floors dry and clean up all water on the floor immediately.  Remove soap buildup in the tub or shower on a regular basis.  Secure bath mats with non-slip, double-sided rug tape.  Remove throw rugs and tripping hazards from the floors. BEDROOMS  Install night lights.  Make sure a bedside light is easy to reach.  Do not use oversized bedding.  Keep a telephone by your bedside.  Have a firm chair with side arms to use for getting dressed.  Remove throw rugs and tripping hazards from the floor. KITCHEN  Keep handles on pots and pans turned toward the center of the stove. Use back burners when possible.  Clean up spills quickly and allow time for drying.  Avoid walking on wet floors.  Avoid hot utensils and knives.  Position shelves so they are not too high or  low.  Place commonly used objects within easy reach.  If necessary, use a sturdy step stool with a grab bar when reaching.  Keep electrical cables out of the way.  Do not use floor polish or wax that makes floors slippery. If you must use wax, use non-skid floor wax.  Remove throw rugs and tripping hazards from the floor. STAIRWAYS  Never leave objects on stairs.  Place handrails on both sides of stairways and use them. Fix any loose handrails. Make sure handrails on both sides of the stairways are as long as the stairs.  Check carpeting to make sure it is firmly attached along stairs. Make repairs to worn or loose carpet promptly.  Avoid placing throw rugs at the top or bottom of stairways, or properly secure the rug with carpet tape to prevent slippage. Get rid of throw rugs, if possible.  Have an electrician put in a light switch at the top and bottom of the stairs. OTHER FALL PREVENTION TIPS  Wear low-heel or rubber-soled shoes that are supportive and fit well. Wear closed toe shoes.  When using a stepladder, make sure it is fully opened and both spreaders are firmly locked. Do not climb a closed stepladder.  Add color or contrast paint or tape to grab bars and handrails in your home. Place contrasting color strips on first and last steps.  Learn and use mobility aids as needed. Install an electrical emergency response system.  Turn on lights to avoid dark areas. Replace light bulbs that burn out immediately. Get light switches that glow.  Arrange furniture to create clear pathways. Keep furniture in the same place.  Firmly attach carpet with non-skid or double-sided tape.  Eliminate uneven floor surfaces.  Select a carpet pattern that does not visually hide the edge of steps.  Be aware of all pets. OTHER HOME SAFETY TIPS  Set the water temperature for 120 F (48.8 C).  Keep emergency numbers on or near the telephone.  Keep smoke detectors on every level of the  home and near sleeping areas. Document Released: 10/10/2002 Document Revised: 04/20/2012 Document Reviewed: 01/09/2012 [facility] Patient Information 2015 [place], [place]. This information is not intended to replace advice given to you by your health care provider. Make sure you discuss any questions you have with your health care provider.

## 2014-05-11 NOTE — Assessment & Plan Note (Signed)
Will check labs

## 2014-05-11 NOTE — Assessment & Plan Note (Signed)
Bone density test 2011was negative, patient decided to wait till next year to have another bone density test

## 2014-05-11 NOTE — Assessment & Plan Note (Addendum)
Td 09 Pneumonia shot 10-05 and 2010 Prevnar, declined, will think about it shingles shot -- 2014d  2004: EGD and Cscope both normal   Cscope 03/2014 Diverticulosis, no need to repeat due to age and results  No further PAPs, see previous entry Breast exam   normal 02-2013 , MMG normal 06-2012; does not desire more MMG , agree;  many med societies rec to d/c MMG at age 80  Diet-exercise: she has a healthy lifestyle   Labs

## 2014-05-11 NOTE — Progress Notes (Signed)
Subjective:    Patient ID: [name], female    DOB: 1930/02/01, 80 y.o.   MRN: [id number]  DOS:  05/11/2014 Type of visit - description: annual exam   Here for Medicare AWV: 1.Risk factors based on Past M, S, F history: reviewed with patient 2 Physical Activities: very active at church, takes walks 3. Depression/mood: (-) screen 4. Hearing: no tinnitus, no decreased hearing   5. ADL's: totally independent , still drives   6. Fall Risk: no recent falls, see instructions 7. Home Safety: feels safe at home, has a med alert device 8 Height, weight, &visual acuity:see VS, saw eye doctor this year, + cataract, sees eye doctor regularly  9. Counseling: diet-exercise discussed   10.Labs ordered based on risk factors: if needed   11.  Referral Coordination-- if needed   12 Care Plan-- see below   13.Cognitive Assessment-- doing well, memory normal, motor and attention span normal  in addition to the Medicare Visit we also assesed: In general, she feels great Diabetes, good medication compliance, ambulatory blood sugars in the low 100s OA, currently not an issue GERD, currently no symptoms  ROS No  CP, SOB Denies  nausea, vomiting diarrhea, blood in the stools (-) cough, sputum production (-) wheezing, chest congestion No dysuria, gross hematuria, difficulty urinating   No vaginal discharge, bleed. No headaches. Denies dizziness     Past Medical History  Diagnosis Date  . Diabetes mellitus   . GERD (gastroesophageal reflux disease)     pt denies  . Osteopenia   . Abnormal breast exam 2004    MMG neg (eval by surgeon neg)  . Hx SBO 2004 , 2007, 2012     exploratory surgery 9-12   . Iritis 2005  . Osteoarthritis     Past Surgical History  Procedure Laterality Date  . Abdominal hysterectomy      in the 70s, for bleeding, no cancer  . Bilateral oophorectomy      per pt  . Total knee arthroplasty Right 12/11  . Exploratory laparotomy  9-12     SBO    History     Social History  . Marital Status: Widowed    Spouse Name: N/A    Number of Children: 2  . Years of Education: N/A   Occupational History  . Has her own business     makes aprons    Social History Main Topics  . Smoking status: Never Smoker   . Smokeless tobacco: Never Used  . Alcohol Use: No  . Drug Use: No  . Sexual Activity: Not on file   Other Topics Concern  . Not on file   Social History Narrative   Lives by herself, her family is in [place], lost 2 sisters                  Medication List       This list is accurate as of: 05/11/14  4:28 PM.  Always use your most recent med list.               acetaminophen 650 MG CR tablet  Commonly known as:  TYLENOL  Take 650 mg by mouth daily as needed for pain.     aspirin 81 MG tablet  Take 81 mg by mouth daily.     CENTRUM SILVER PO  Take 1 capsule by mouth daily.     metFORMIN 500 MG tablet  Commonly known as:  GLUCOPHAGE  TAKE 1  TABLET BY MOUTH TWICE DAILY WITH A MEAL           Objective:   Physical Exam BP 129/72  Pulse 78  Temp(Src) 97.8 F (36.6 C)  Ht 5\' 6"  (1.676 m)  Wt 200 lb (90.719 kg)  BMI 32.30 kg/m2  SpO2 98%  General -- alert, well-developed, NAD.  Neck --+  Thyromegaly: Thyroid is a slightly enlarged, not nodular or tender. no LAD HEENT-- Not pale.   Lungs -- normal respiratory effort, no intercostal retractions, no accessory muscle use, and normal breath sounds.  Heart-- normal rate, regular rhythm, no murmur.  Abdomen-- Not distended, good bowel sounds,soft, non-tender. DIABETIC FEET EXAM: No lower extremity edema Normal pedal pulses bilaterally Skin normal, nails normal, no calluses Pinprick examination of the feet normal. Neurologic--  alert & oriented X3. Speech normal, gait appropriate for age, strength symmetric and appropriate for age.  Psych-- Cognition and judgment appear intact. Cooperative with normal attention span and concentration. No anxious or depressed  appearing.       Assessment & Plan:

## 2014-05-12 ENCOUNTER — Other Ambulatory Visit (INDEPENDENT_AMBULATORY_CARE_PROVIDER_SITE_OTHER): Payer: Medicare Other

## 2014-05-12 ENCOUNTER — Encounter: Payer: Medicare Other | Admitting: Internal Medicine

## 2014-05-12 DIAGNOSIS — Z862 Personal history of diseases of the blood and blood-forming organs and certain disorders involving the immune mechanism: Secondary | ICD-10-CM

## 2014-05-12 DIAGNOSIS — E119 Type 2 diabetes mellitus without complications: Secondary | ICD-10-CM

## 2014-05-12 DIAGNOSIS — E785 Hyperlipidemia, unspecified: Secondary | ICD-10-CM

## 2014-05-12 LAB — CBC WITH DIFFERENTIAL/PLATELET
Basophils Absolute: 0 10*3/uL (ref 0.0–0.1)
Basophils Relative: 0.5 % (ref 0.0–3.0)
EOS PCT: 2.2 % (ref 0.0–5.0)
Eosinophils Absolute: 0.1 10*3/uL (ref 0.0–0.7)
HEMATOCRIT: 37.2 % (ref 36.0–46.0)
Hemoglobin: 12 g/dL (ref 12.0–15.0)
LYMPHS ABS: 3.4 10*3/uL (ref 0.7–4.0)
Lymphocytes Relative: 52.3 % — ABNORMAL HIGH (ref 12.0–46.0)
MCHC: 32.2 g/dL (ref 30.0–36.0)
MCV: 85 fl (ref 78.0–100.0)
Monocytes Absolute: 0.4 10*3/uL (ref 0.1–1.0)
Monocytes Relative: 6.8 % (ref 3.0–12.0)
Neutro Abs: 2.5 10*3/uL (ref 1.4–7.7)
Neutrophils Relative %: 38.2 % — ABNORMAL LOW (ref 43.0–77.0)
PLATELETS: 218 10*3/uL (ref 150.0–400.0)
RBC: 4.37 Mil/uL (ref 3.87–5.11)
RDW: 14.6 % (ref 11.5–15.5)
WBC: 6.4 10*3/uL (ref 4.0–10.5)

## 2014-05-12 LAB — LIPID PANEL
CHOLESTEROL: 207 mg/dL — AB (ref 0–200)
HDL: 55.8 mg/dL (ref >39.00)
LDL Cholesterol: 139 mg/dL — ABNORMAL HIGH (ref 0–99)
NonHDL: 151.2
TRIGLYCERIDES: 62 mg/dL (ref 0.0–149.0)
Total CHOL/HDL Ratio: 4
VLDL: 12.4 mg/dL (ref 0.0–40.0)

## 2014-05-12 LAB — MICROALBUMIN / CREATININE URINE RATIO
Creatinine,U: 117.1 mg/dL
MICROALB UR: 0.3 mg/dL (ref 0.0–1.9)
MICROALB/CREAT RATIO: 0.3 mg/g (ref 0.0–30.0)

## 2014-05-12 LAB — HEMOGLOBIN A1C: HEMOGLOBIN A1C: 6.7 % — AB (ref 4.6–6.5)

## 2014-05-15 ENCOUNTER — Encounter: Payer: Self-pay | Admitting: *Deleted

## 2014-07-04 DIAGNOSIS — K56609 Unspecified intestinal obstruction, unspecified as to partial versus complete obstruction: Secondary | ICD-10-CM

## 2014-07-04 HISTORY — DX: Unspecified intestinal obstruction, unspecified as to partial versus complete obstruction: K56.609

## 2014-07-07 ENCOUNTER — Other Ambulatory Visit: Payer: Self-pay | Admitting: Internal Medicine

## 2014-07-10 ENCOUNTER — Inpatient Hospital Stay (HOSPITAL_COMMUNITY)
Admission: EM | Admit: 2014-07-10 | Discharge: 2014-07-13 | DRG: 390 | Disposition: A | Discharge status: Home / Self Care | Payer: Medicare Other | Attending: Internal Medicine | Admitting: Internal Medicine

## 2014-07-10 ENCOUNTER — Encounter (HOSPITAL_COMMUNITY): Payer: Self-pay | Admitting: Emergency Medicine

## 2014-07-10 DIAGNOSIS — Z791 Long term (current) use of non-steroidal anti-inflammatories (NSAID): Secondary | ICD-10-CM

## 2014-07-10 DIAGNOSIS — Z803 Family history of malignant neoplasm of breast: Secondary | ICD-10-CM

## 2014-07-10 DIAGNOSIS — R112 Nausea with vomiting, unspecified: Secondary | ICD-10-CM | POA: Diagnosis not present

## 2014-07-10 DIAGNOSIS — E119 Type 2 diabetes mellitus without complications: Secondary | ICD-10-CM

## 2014-07-10 DIAGNOSIS — Z833 Family history of diabetes mellitus: Secondary | ICD-10-CM

## 2014-07-10 DIAGNOSIS — Z96659 Presence of unspecified artificial knee joint: Secondary | ICD-10-CM

## 2014-07-10 DIAGNOSIS — K219 Gastro-esophageal reflux disease without esophagitis: Secondary | ICD-10-CM | POA: Diagnosis present

## 2014-07-10 DIAGNOSIS — M1990 Unspecified osteoarthritis, unspecified site: Secondary | ICD-10-CM

## 2014-07-10 DIAGNOSIS — Z888 Allergy status to other drugs, medicaments and biological substances status: Secondary | ICD-10-CM

## 2014-07-10 DIAGNOSIS — Z602 Problems related to living alone: Secondary | ICD-10-CM

## 2014-07-10 DIAGNOSIS — Z79899 Other long term (current) drug therapy: Secondary | ICD-10-CM

## 2014-07-10 DIAGNOSIS — E876 Hypokalemia: Secondary | ICD-10-CM

## 2014-07-10 DIAGNOSIS — K56609 Unspecified intestinal obstruction, unspecified as to partial versus complete obstruction: Secondary | ICD-10-CM | POA: Diagnosis not present

## 2014-07-10 DIAGNOSIS — D649 Anemia, unspecified: Secondary | ICD-10-CM

## 2014-07-10 DIAGNOSIS — E785 Hyperlipidemia, unspecified: Secondary | ICD-10-CM | POA: Diagnosis present

## 2014-07-10 DIAGNOSIS — Z7982 Long term (current) use of aspirin: Secondary | ICD-10-CM

## 2014-07-10 HISTORY — DX: Anemia, unspecified: D64.9

## 2014-07-10 HISTORY — DX: Type 2 diabetes mellitus without complications: E11.9

## 2014-07-10 HISTORY — DX: Personal history of other medical treatment: Z92.89

## 2014-07-10 HISTORY — DX: Unspecified osteoarthritis, unspecified site: M19.90

## 2014-07-10 LAB — COMPREHENSIVE METABOLIC PANEL
ALT: 13 U/L (ref 0–35)
ANION GAP: 12 (ref 5–15)
AST: 16 U/L (ref 0–37)
Albumin: 3.7 g/dL (ref 3.5–5.2)
Alkaline Phosphatase: 60 U/L (ref 39–117)
BUN: 18 mg/dL (ref 6–23)
CALCIUM: 9.5 mg/dL (ref 8.4–10.5)
CO2: 27 meq/L (ref 19–32)
CREATININE: 0.76 mg/dL (ref 0.50–1.10)
Chloride: 102 mEq/L (ref 96–112)
GFR calc Af Amer: 89 mL/min — ABNORMAL LOW (ref >90)
GFR, EST NON AFRICAN AMERICAN: 77 mL/min — AB (ref >90)
GLUCOSE: 156 mg/dL — AB (ref 70–99)
Potassium: 4.3 mEq/L (ref 3.7–5.3)
Sodium: 141 mEq/L (ref 137–147)
Total Bilirubin: <0.2 mg/dL — ABNORMAL LOW (ref 0.3–1.2)
Total Protein: 7.5 g/dL (ref 6.0–8.3)

## 2014-07-10 LAB — CBC WITH DIFFERENTIAL/PLATELET
BASOS ABS: 0 10*3/uL (ref 0.0–0.1)
Basophils Relative: 0 % (ref 0–1)
EOS PCT: 1 % (ref 0–5)
Eosinophils Absolute: 0.1 10*3/uL (ref 0.0–0.7)
HEMATOCRIT: 36.6 % (ref 36.0–46.0)
Hemoglobin: 11.9 g/dL — ABNORMAL LOW (ref 12.0–15.0)
LYMPHS ABS: 3.8 10*3/uL (ref 0.7–4.0)
Lymphocytes Relative: 38 % (ref 12–46)
MCH: 26.6 pg (ref 26.0–34.0)
MCHC: 32.5 g/dL (ref 30.0–36.0)
MCV: 81.9 fL (ref 78.0–100.0)
MONO ABS: 0.4 10*3/uL (ref 0.1–1.0)
Monocytes Relative: 4 % (ref 3–12)
Neutro Abs: 5.7 10*3/uL (ref 1.7–7.7)
Neutrophils Relative %: 57 % (ref 43–77)
Platelets: 206 10*3/uL (ref 150–400)
RBC: 4.47 MIL/uL (ref 3.87–5.11)
RDW: 13.6 % (ref 11.5–15.5)
WBC: 10 10*3/uL (ref 4.0–10.5)

## 2014-07-10 LAB — LIPASE, BLOOD: LIPASE: 27 U/L (ref 11–59)

## 2014-07-10 MED ORDER — IOHEXOL 300 MG/ML  SOLN
25.0000 mL | Freq: Once | INTRAMUSCULAR | Status: AC | PRN
  Administered 2014-07-10: 25 mL via ORAL

## 2014-07-10 MED ORDER — SODIUM CHLORIDE 0.9 % IV BOLUS (SEPSIS)
500.0000 mL | Freq: Once | INTRAVENOUS | Status: AC
  Administered 2014-07-10: 500 mL via INTRAVENOUS

## 2014-07-10 MED ORDER — ONDANSETRON HCL 4 MG/2ML IJ SOLN
4.0000 mg | Freq: Once | INTRAMUSCULAR | Status: AC
  Administered 2014-07-10: 4 mg via INTRAVENOUS
  Filled 2014-07-10: qty 2

## 2014-07-10 MED ORDER — MORPHINE SULFATE 4 MG/ML IJ SOLN
4.0000 mg | Freq: Once | INTRAMUSCULAR | Status: AC
  Administered 2014-07-10: 4 mg via INTRAVENOUS
  Filled 2014-07-10: qty 1

## 2014-07-10 NOTE — ED Notes (Addendum)
Pt. reports pain across her abdomen with nausea , emesis and loosed stools onset this evening , denies fever or chills. Pt. stated history of bowel obstruction .

## 2014-07-10 NOTE — ED Provider Notes (Signed)
CSN: [id number]     Arrival date & time 07/10/14  2137 History   First MD Initiated Contact with Patient 07/10/14 2303     Chief Complaint  Patient presents with  . Abdominal Pain     (Consider location/radiation/quality/duration/timing/severity/associated sxs/prior Treatment) HPI Pt presenting with c/o abdominal pain.  She states pain began approx 7pm tonight and is located on her right side in both upper and lower abdomen.  Approx one hour after pain began she had several episodes of emesis, has not been able to keep down liquids.  No fever/chills.  Denies urinary symptoms.  Has had one looser bowel movement than normal- denies blood or melena in stool.  Per prior chart review, pt has hx of SBO, most recent episode of similar pain in 2/15 was due to enteritis which was self limited.  Symptoms are constant and moderate. There are no other associated systemic symptoms, there are no other alleviating or modifying factors.   Past Medical History  Diagnosis Date  . Diabetes mellitus   . GERD (gastroesophageal reflux disease)     pt denies  . Osteopenia   . Abnormal breast exam 2004    MMG neg (eval by surgeon neg)  . Hx SBO 2004 , 2007, 2012     exploratory surgery 9-12   . Iritis 2005  . Osteoarthritis    Past Surgical History  Procedure Laterality Date  . Abdominal hysterectomy      in the 70s, for bleeding, no cancer  . Bilateral oophorectomy      per pt  . Total knee arthroplasty Right 12/11  . Exploratory laparotomy  9-12     SBO   Family History  Problem Relation Age of Onset  . Diabetes Father   . Heart attack Neg Hx   . Breast cancer Sister   . Sudden death Sister   . Colon cancer Neg Hx   . Breast cancer Sister    History  Substance Use Topics  . Smoking status: Never Smoker   . Smokeless tobacco: Never Used  . Alcohol Use: No   OB History   Grav Para Term Preterm Abortions TAB SAB Ect Mult Living                 Review of Systems ROS reviewed and all  otherwise negative except for mentioned in HPI    Allergies  Zocor and Lipitor  Home Medications   Prior to Admission medications   Medication Sig Start Date End Date Taking? Authorizing Provider  aspirin 81 MG tablet Take 81 mg by mouth daily.     Yes Historical Provider, MD  ibuprofen (ADVIL,MOTRIN) 200 MG tablet Take 200 mg by mouth every 6 (six) hours as needed for moderate pain.   Yes Historical Provider, MD  metFORMIN (GLUCOPHAGE) 500 MG tablet Take 500 mg by mouth 2 (two) times daily with a meal.   Yes Historical Provider, MD  Multiple Vitamins-Minerals (CENTRUM SILVER PO) Take 1 capsule by mouth daily.     Yes Historical Provider, MD   BP 151/75  Pulse 91  Temp(Src) 98.9 F (37.2 C) (Oral)  Resp 15  Wt 202 lb (91.627 kg)  SpO2 100% Vitals reviewed Physical Exam Physical Examination: General appearance - alert, well appearing, and in no distress Mental status - alert, oriented to person, place, and time Eyes - no conjunctival injection, no scleral icterus Mouth - mucous membranes moist, pharynx normal without lesions Chest - clear to auscultation, no wheezes, rales or  rhonchi, symmetric air entry Heart - normal rate, regular rhythm, normal S1, S2, no murmurs, rubs, clicks or gallops Abdomen - soft, ttp in right upper and right lower abdomen, nabs, no gaurding or rebound tenderness, nondistended, no masses or organomegaly Extremities - peripheral pulses normal, no pedal edema, no clubbing or cyanosis Skin - normal coloration and turgor, no rashes  ED Course  Procedures (including critical care time)  3:36 AM d/w Dr. Georgette Dover, pt will have surgical consult in the AM.  Will admit to medicine.   3:47 AM d/w hospitalist, [facility], pt to be admitted to med/surg bed.     Date: 07/11/2014  Rate: 78  Rhythm: normal sinus rhythm  QRS Axis: normal  Intervals: normal  ST/T Wave abnormalities: normal  Conduction Disutrbances: none  Narrative Interpretation: unremarkable  EKG  not available in EPIC for interpretation in MUSE   Labs Review Labs Reviewed  CBC WITH DIFFERENTIAL - Abnormal; Notable for the following:    Hemoglobin 11.9 (*)    All other components within normal limits  COMPREHENSIVE METABOLIC PANEL - Abnormal; Notable for the following:    Glucose, Bld 156 (*)    Total Bilirubin <0.2 (*)    GFR calc non Af Amer 77 (*)    GFR calc Af Amer 89 (*)    All other components within normal limits  URINALYSIS, ROUTINE W REFLEX MICROSCOPIC - Abnormal; Notable for the following:    APPearance TURBID (*)    Specific Gravity, Urine 1.033 (*)    Leukocytes, UA SMALL (*)    All other components within normal limits  URINE MICROSCOPIC-ADD ON - Abnormal; Notable for the following:    Squamous Epithelial / LPF MANY (*)    Bacteria, UA MANY (*)    All other components within normal limits  LACTIC ACID, PLASMA - Abnormal; Notable for the following:    Lactic Acid, Venous 2.8 (*)    All other components within normal limits  GLUCOSE, CAPILLARY - Abnormal; Notable for the following:    Glucose-Capillary 147 (*)    All other components within normal limits  GLUCOSE, CAPILLARY - Abnormal; Notable for the following:    Glucose-Capillary 156 (*)    All other components within normal limits  LIPASE, BLOOD  APTT  PROTIME-INR    Imaging Review Ct Abdomen Pelvis W Contrast  07/11/2014   CLINICAL DATA:  ABDOMINAL PAIN  EXAM: CT ABDOMEN AND PELVIS WITH CONTRAST  TECHNIQUE: Multidetector CT imaging of the abdomen and pelvis was performed using the standard protocol following bolus administration of intravenous contrast.  CONTRAST:  180mL OMNIPAQUE IOHEXOL 300 MG/ML  SOLN  COMPARISON:  Prior CT from 12/08/2013  FINDINGS: Subsegmental atelectasis seen within the partially visualized lung bases. Few scattered partially calcified nodular density measuring up to 8 mm seen within the partially visualized right upper lobe (202, image 1), likely related to prior granulomatous  infection. Subpleural nodules present within the right middle lobe and lingula measuring up to 7 mm are stable.  The liver demonstrates a normal contrast enhanced appearance. Gallbladder within normal limits. No biliary ductal dilatation. The spleen and adrenal glands demonstrate a normal contrast enhanced appearance. Fatty atrophy noted within the pancreatic body. Pancreas is otherwise unremarkable.  The kidneys are equal in size with symmetric enhancement. No nephrolithiasis, hydronephrosis, or focal enhancing renal mass. No hydroureter.  There is mild soft tissue stranding about a dilated loop of small bowel within the right lower quadrant (series 01, image 61). Fecalization of the small bowel  is seen at this level. The small bowel is decompressed distally. The small bowel is mildly dilated proximally with internal air-fluid levels. The small bowel itself measures within normal limits up to 3 cm in diameter at this time. Findings are suggestive of partial/early small bowel obstruction. The small bowel is decompressed distally. The colon is within normal limits without acute inflammatory changes. Scattered sigmoid diverticulosis noted without acute diverticulitis.  Bladder is within normal limits. Uterus and ovaries are not visualized.  No free air or fluid. No pathologically enlarged intra-abdominal or pelvic lymph nodes.  Moderate atheromatous disease again noted within the intra-abdominal aorta. No aneurysm. Coronary artery calcifications noted.  No acute osseous abnormality. No worrisome lytic or blastic osseous lesions.  IMPRESSION: 1. Findings suggestive of partial versus early small bowel obstruction with transition point in the right lower quadrant as above. 2. Sigmoid diverticulosis without acute diverticulitis. 3. Stable subpleural nodules in the lung bases measuring up to 7 mm in the lateral segment of the right middle lobe. If the patient is at low risk for bronchogenic carcinoma, an additional  follow-up an 6 months to 1 year could be considered. If the patient is at high risk for bronchogenic carcinoma an additional follow-up an 3 months could be considered to ensure stability. 4. Atherosclerosis with 3 vessel coronary artery disease.   Electronically Signed   By: Jeannine Boga M.D.   On: 07/11/2014 02:52     EKG Interpretation None      MDM   Final diagnoses:  Small bowel obstruction  Anemia, unspecified  Osteoarthrosis, unspecified whether generalized or localized, unspecified site  Type II or unspecified type diabetes mellitus without mention of complication, not stated as uncontrolled   Pt presenting with abdominal pain.  Workup reveals SBO, d/w surgery and patient admitted to medicine.  Pt pain controlled in the ED and no active vomiting.      Threasa Beards, MD 07/11/14 [phone]

## 2014-07-10 NOTE — ED Notes (Signed)
Pt unable to void at this time. 

## 2014-07-11 ENCOUNTER — Inpatient Hospital Stay (HOSPITAL_COMMUNITY): Payer: Medicare Other

## 2014-07-11 ENCOUNTER — Encounter (HOSPITAL_COMMUNITY): Payer: Self-pay | Admitting: Radiology

## 2014-07-11 ENCOUNTER — Emergency Department (HOSPITAL_COMMUNITY): Payer: Medicare Other

## 2014-07-11 DIAGNOSIS — Z833 Family history of diabetes mellitus: Secondary | ICD-10-CM | POA: Diagnosis not present

## 2014-07-11 DIAGNOSIS — E119 Type 2 diabetes mellitus without complications: Secondary | ICD-10-CM | POA: Diagnosis present

## 2014-07-11 DIAGNOSIS — M1990 Unspecified osteoarthritis, unspecified site: Secondary | ICD-10-CM

## 2014-07-11 DIAGNOSIS — Z96659 Presence of unspecified artificial knee joint: Secondary | ICD-10-CM | POA: Diagnosis not present

## 2014-07-11 DIAGNOSIS — Z79899 Other long term (current) drug therapy: Secondary | ICD-10-CM | POA: Diagnosis not present

## 2014-07-11 DIAGNOSIS — K219 Gastro-esophageal reflux disease without esophagitis: Secondary | ICD-10-CM | POA: Diagnosis present

## 2014-07-11 DIAGNOSIS — Z7982 Long term (current) use of aspirin: Secondary | ICD-10-CM | POA: Diagnosis not present

## 2014-07-11 DIAGNOSIS — D649 Anemia, unspecified: Secondary | ICD-10-CM

## 2014-07-11 DIAGNOSIS — R112 Nausea with vomiting, unspecified: Secondary | ICD-10-CM | POA: Diagnosis present

## 2014-07-11 DIAGNOSIS — Z888 Allergy status to other drugs, medicaments and biological substances status: Secondary | ICD-10-CM | POA: Diagnosis not present

## 2014-07-11 DIAGNOSIS — K56609 Unspecified intestinal obstruction, unspecified as to partial versus complete obstruction: Secondary | ICD-10-CM | POA: Diagnosis present

## 2014-07-11 DIAGNOSIS — E785 Hyperlipidemia, unspecified: Secondary | ICD-10-CM | POA: Diagnosis present

## 2014-07-11 DIAGNOSIS — Z602 Problems related to living alone: Secondary | ICD-10-CM | POA: Diagnosis not present

## 2014-07-11 DIAGNOSIS — E876 Hypokalemia: Secondary | ICD-10-CM | POA: Diagnosis not present

## 2014-07-11 DIAGNOSIS — Z791 Long term (current) use of non-steroidal anti-inflammatories (NSAID): Secondary | ICD-10-CM | POA: Diagnosis not present

## 2014-07-11 DIAGNOSIS — Z803 Family history of malignant neoplasm of breast: Secondary | ICD-10-CM | POA: Diagnosis not present

## 2014-07-11 LAB — URINALYSIS, ROUTINE W REFLEX MICROSCOPIC
Bilirubin Urine: NEGATIVE
Glucose, UA: NEGATIVE mg/dL
Hgb urine dipstick: NEGATIVE
KETONES UR: NEGATIVE mg/dL
Nitrite: NEGATIVE
Protein, ur: NEGATIVE mg/dL
Specific Gravity, Urine: 1.033 — ABNORMAL HIGH (ref 1.005–1.030)
UROBILINOGEN UA: 0.2 mg/dL (ref 0.0–1.0)
pH: 7.5 (ref 5.0–8.0)

## 2014-07-11 LAB — GLUCOSE, CAPILLARY
GLUCOSE-CAPILLARY: 147 mg/dL — AB (ref 70–99)
GLUCOSE-CAPILLARY: 156 mg/dL — AB (ref 70–99)
Glucose-Capillary: 103 mg/dL — ABNORMAL HIGH (ref 70–99)
Glucose-Capillary: 72 mg/dL (ref 70–99)
Glucose-Capillary: 75 mg/dL (ref 70–99)
Glucose-Capillary: 97 mg/dL (ref 70–99)

## 2014-07-11 LAB — URINE MICROSCOPIC-ADD ON

## 2014-07-11 LAB — APTT: aPTT: 29 seconds (ref 24–37)

## 2014-07-11 LAB — LACTIC ACID, PLASMA: Lactic Acid, Venous: 2.8 mmol/L — ABNORMAL HIGH (ref 0.5–2.2)

## 2014-07-11 LAB — PROTIME-INR
INR: 1.08 (ref 0.00–1.49)
Prothrombin Time: 14 seconds (ref 11.6–15.2)

## 2014-07-11 MED ORDER — DEXTROSE-NACL 5-0.45 % IV SOLN
INTRAVENOUS | Status: DC
  Administered 2014-07-11: 18:00:00 via INTRAVENOUS

## 2014-07-11 MED ORDER — ASPIRIN 81 MG PO TABS: 81.0000 mg | ORAL_TABLET | Freq: Every day | ORAL | Status: DC

## 2014-07-11 MED ORDER — ONDANSETRON HCL 4 MG/2ML IJ SOLN
4.0000 mg | Freq: Three times a day (TID) | INTRAMUSCULAR | Status: DC | PRN
  Filled 2014-07-11: qty 2

## 2014-07-11 MED ORDER — MORPHINE SULFATE 2 MG/ML IJ SOLN
2.0000 mg | INTRAMUSCULAR | Status: DC | PRN
  Administered 2014-07-11: 2 mg via INTRAVENOUS
  Filled 2014-07-11 (×2): qty 1

## 2014-07-11 MED ORDER — IOHEXOL 300 MG/ML  SOLN
100.0000 mL | Freq: Once | INTRAMUSCULAR | Status: AC | PRN
  Administered 2014-07-11: 100 mL via INTRAVENOUS

## 2014-07-11 MED ORDER — HEPARIN SODIUM (PORCINE) 5000 UNIT/ML IJ SOLN
5000.0000 [IU] | Freq: Three times a day (TID) | INTRAMUSCULAR | Status: DC
  Administered 2014-07-11 (×2): 5000 [IU] via SUBCUTANEOUS
  Filled 2014-07-11 (×7): qty 1

## 2014-07-11 MED ORDER — ASPIRIN 81 MG PO CHEW
81.0000 mg | CHEWABLE_TABLET | Freq: Every day | ORAL | Status: DC
  Administered 2014-07-11 – 2014-07-13 (×3): 81 mg via ORAL
  Filled 2014-07-11 (×3): qty 1

## 2014-07-11 MED ORDER — MORPHINE SULFATE 4 MG/ML IJ SOLN
4.0000 mg | Freq: Once | INTRAMUSCULAR | Status: AC
  Administered 2014-07-11: 4 mg via INTRAVENOUS
  Filled 2014-07-11: qty 1

## 2014-07-11 MED ORDER — SODIUM CHLORIDE 0.9 % IV SOLN
INTRAVENOUS | Status: DC
  Administered 2014-07-11 – 2014-07-12 (×3): via INTRAVENOUS

## 2014-07-11 MED ORDER — INSULIN ASPART 100 UNIT/ML ~~LOC~~ SOLN
0.0000 [IU] | SUBCUTANEOUS | Status: DC
  Administered 2014-07-11: 2 [IU] via SUBCUTANEOUS
  Administered 2014-07-12: 1 [IU] via SUBCUTANEOUS
  Administered 2014-07-12: 2 [IU] via SUBCUTANEOUS
  Administered 2014-07-12: 1 [IU] via SUBCUTANEOUS

## 2014-07-11 NOTE — ED Notes (Signed)
Patient placement called and stated that the patient was going to be assigned to a different floor/room.

## 2014-07-11 NOTE — Progress Notes (Signed)
Patient Demographics  [name], is a 80 y.o. female, DOB - 06/11/1930, KVQ:[id number]  Admit date - 07/10/2014   Admitting Physician Ivor Costa, MD  Outpatient Primary MD for the patient is Kathlene November, MD  LOS - 1   Chief Complaint  Patient presents with  . Abdominal Pain      Brief history of present illness: Patient presents with abdominal pain, nausea and vomiting, known history of small bowel obstruction, status post exploratory laparotomy x3 most recent in 2012, CT abdomen findings suggestive of partial versus early small bowel obstruction, surgical consult appreciated, will continue with conservative management including n.p.o., IV fluids.  Subjective:   [name] today has, No headache, No chest pain,, No new weakness tingling or numbness, No Cough - SOB. Abdominal pain has improved, complaints of mild nausea. Assessment & Plan    Principal Problem:   Small bowel obstruction Active Problems:   GERD   OSTEOARTHRITIS  1. Small bowel obstruction: This diagnosis is suggested by CT abdomen findings. This is most likely due to adhesive obstruction given that patient had several abdominal surgeries in the past. Currently patient is hemodynamically stable. Electrolyte is okay. No signs of sepsis.  Continue patient on n.p.o., IV fluid, when necessary pain and nausea medicine, cardiology consult appreciated, as long patient is asymptomatic we'll hold on reinserting NG tube, repeat abdominal x-ray in a.m..  2. Diabetes type 2: Recent A1c was 6.7. Patient is on metformin at home  Metformin is on home, continue with insulin sliding scale. CBGs are acceptable.  3. GERD:  No acid reflux symptoms at this moment.   4. Osteoarthritis: Will keep on when necessary Tylenol.  5. Stable subpleural nodules in the lung bases:  measuring up to 7 mm in the lateral  segment of the right middle lobe. Follow up as outpatient     Code Status: Full  Family Communication: None at bedside.  Disposition Plan: Home   Procedures  None   Consults   Surgical consult   Medications  Scheduled Meds: . aspirin  81 mg Oral Daily  . heparin  5,000 Units Subcutaneous 3 times per day  . insulin aspart  0-9 Units Subcutaneous 6 times per day   Continuous Infusions: . sodium chloride 125 mL/hr at 07/11/14 0725   PRN Meds:.morphine injection, ondansetron  DVT Prophylaxis  Heparin - SCDs   Lab Results  Component Value Date   PLT 206 07/10/2014    Antibiotics    Anti-infectives   None          Objective:   Filed Vitals:   07/11/14 0454 07/11/14 0559 07/11/14 0616 07/11/14 1339  BP: 151/75 151/75  125/67  Pulse: 91 91  75  Temp: 98.9 F (37.2 C) 98.9 F (37.2 C)  98.1 F (36.7 C)  TempSrc: Oral Oral    Resp:    16  Weight:   91.627 kg (202 lb)   SpO2: 100% 100%  98%    Wt Readings from Last 3 Encounters:  07/11/14 91.627 kg (202 lb)  05/11/14 90.719 kg (200 lb)  03/07/14 92.08 kg (203 lb)     Intake/Output Summary (Last 24 hours) at 07/11/14 1539 Last data filed at 07/11/14 1100  Gross per 24 hour  Intake    500 ml  Output      0 ml  Net    500 ml     Physical Exam  Awake Alert, Oriented X 3, No new F.N deficits, Normal affect [place].AT,PERRAL Supple Neck,No JVD, No cervical lymphadenopathy appriciated.  Symmetrical Chest wall movement, Good air movement bilaterally, CTAB RRR,No Gallops,Rubs or new Murmurs, No Parasternal Heave .Sounds, Abd Soft,  No organomegaly appriciated, No rebound - guarding or rigidity. Minimal tenderness to palpation, bowel sounds present, mildly hyperperistaltic. No Cyanosis, Clubbing or edema, No new Rash or bruise     Data Review   Micro Results No results found for this or any previous visit (from the past 240 hour(s)).  Radiology Reports Ct Abdomen Pelvis W Contrast  07/11/2014    CLINICAL DATA:  ABDOMINAL PAIN  EXAM: CT ABDOMEN AND PELVIS WITH CONTRAST  TECHNIQUE: Multidetector CT imaging of the abdomen and pelvis was performed using the standard protocol following bolus administration of intravenous contrast.  CONTRAST:  132mL OMNIPAQUE IOHEXOL 300 MG/ML  SOLN  COMPARISON:  Prior CT from 12/08/2013  FINDINGS: Subsegmental atelectasis seen within the partially visualized lung bases. Few scattered partially calcified nodular density measuring up to 8 mm seen within the partially visualized right upper lobe (202, image 1), likely related to prior granulomatous infection. Subpleural nodules present within the right middle lobe and lingula measuring up to 7 mm are stable.  The liver demonstrates a normal contrast enhanced appearance. Gallbladder within normal limits. No biliary ductal dilatation. The spleen and adrenal glands demonstrate a normal contrast enhanced appearance. Fatty atrophy noted within the pancreatic body. Pancreas is otherwise unremarkable.  The kidneys are equal in size with symmetric enhancement. No nephrolithiasis, hydronephrosis, or focal enhancing renal mass. No hydroureter.  There is mild soft tissue stranding about a dilated loop of small bowel within the right lower quadrant (series 01, image 61). Fecalization of the small bowel is seen at this level. The small bowel is decompressed distally. The small bowel is mildly dilated proximally with internal air-fluid levels. The small bowel itself measures within normal limits up to 3 cm in diameter at this time. Findings are suggestive of partial/early small bowel obstruction. The small bowel is decompressed distally. The colon is within normal limits without acute inflammatory changes. Scattered sigmoid diverticulosis noted without acute diverticulitis.  Bladder is within normal limits. Uterus and ovaries are not visualized.  No free air or fluid. No pathologically enlarged intra-abdominal or pelvic lymph nodes.  Moderate  atheromatous disease again noted within the intra-abdominal aorta. No aneurysm. Coronary artery calcifications noted.  No acute osseous abnormality. No worrisome lytic or blastic osseous lesions.  IMPRESSION: 1. Findings suggestive of partial versus early small bowel obstruction with transition point in the right lower quadrant as above. 2. Sigmoid diverticulosis without acute diverticulitis. 3. Stable subpleural nodules in the lung bases measuring up to 7 mm in the lateral segment of the right middle lobe. If the patient is at low risk for bronchogenic carcinoma, an additional follow-up an 6 months to 1 year could be considered. If the patient is at high risk for bronchogenic carcinoma an additional follow-up an 3 months could be considered to ensure stability. 4. Atherosclerosis with 3 vessel coronary artery disease.   Electronically Signed   By: Jeannine Boga M.D.   On: 07/11/2014 02:52    CBC  Recent Labs Lab 07/10/14 2152  WBC 10.0  HGB 11.9*  HCT 36.6  PLT 206  MCV 81.9  MCH  26.6  MCHC 32.5  RDW 13.6  LYMPHSABS 3.8  MONOABS 0.4  EOSABS 0.1  BASOSABS 0.0    Chemistries   Recent Labs Lab 07/10/14 2152  NA 141  K 4.3  CL 102  CO2 27  GLUCOSE 156*  BUN 18  CREATININE 0.76  CALCIUM 9.5  AST 16  ALT 13  ALKPHOS 60  BILITOT <0.2*   ------------------------------------------------------------------------------------------------------------------ CrCl is unknown because both a height and weight (above a minimum accepted value) are required for this calculation. ------------------------------------------------------------------------------------------------------------------ No results found for this basename: HGBA1C,  in the last 72 hours ------------------------------------------------------------------------------------------------------------------ No results found for this basename: CHOL, HDL, LDLCALC, TRIG, CHOLHDL, LDLDIRECT,  in the last 72  hours ------------------------------------------------------------------------------------------------------------------ No results found for this basename: TSH, T4TOTAL, FREET3, T3FREE, THYROIDAB,  in the last 72 hours ------------------------------------------------------------------------------------------------------------------ No results found for this basename: VITAMINB12, FOLATE, FERRITIN, TIBC, IRON, RETICCTPCT,  in the last 72 hours  Coagulation profile  Recent Labs Lab 07/11/14 0550  INR 1.08    No results found for this basename: DDIMER,  in the last 72 hours  Cardiac Enzymes No results found for this basename: CK, CKMB, TROPONINI, MYOGLOBIN,  in the last 168 hours ------------------------------------------------------------------------------------------------------------------ No components found with this basename: POCBNP,      Time Spent in minutes  25 minutes.   [facility], DAWOOD M.D on 07/11/2014 at 3:39 PM  Between 7am to 7pm - Pager - [phone]  After 7pm go to [email or web address] - password [email or web address]  And look for the night coverage person covering for me after hours  [facility] Office  [phone]   **Disclaimer: This note may have been dictated with voice recognition software. Similar sounding words can inadvertently be transcribed and this note may contain transcription errors which may not have been corrected upon publication of note.**

## 2014-07-11 NOTE — Progress Notes (Signed)
Utilization review completed.  

## 2014-07-11 NOTE — H&P (Signed)
[facility] History and Physical  [name] YKD:[id number] DOB: 06-07-1930 DOA: 07/10/2014  Referring physician: ED physician PCP: Kathlene November, MD  Specialists:   Chief Complaint: Nausea, vomiting and abdominal pain  HPI: [name] is a 80 y.o. female with PMH of type 2 diabetes, arthritis and small bowel obstruction. She had 3 exploratory surgeries in the past. She presents with nausea, vomiting and abdominal pain for one day. Yesterday evening, at about 7:00 PM, she suddenly feels abdominal pain. It is located at right side of abdomen. Initially it was intermittent, and then becomes constant. It is 10 out of 10 in severity, stabbing-like pain. No radiation. Eating making make it worse, nothing makes it better. Patient does not pass gas. Patient does not have a fever or chills. She has nausea and vomited 3 times without blood in the vomitus. CT-abdomen showed partial versus early small bowel obstruction. Currently patient is hemodynamically stable. Surgeon was consulted by ED, and no urgent procedure needed at this moment per surgeon.  IVF was given and admitted to inpatient.   Review of Systems: As presented in the history of presenting illness, rest negative.  Where does patient live?  Lives alone at home in [place] Can patient participate in ADLs? Yes  Allergy:  Allergies  Allergen Reactions  . Zocor [Simvastatin]     Ear ringing  . Lipitor [Atorvastatin]     tinnitus    Past Medical History  Diagnosis Date  . Diabetes mellitus   . GERD (gastroesophageal reflux disease)     pt denies  . Osteopenia   . Abnormal breast exam 2004    MMG neg (eval by surgeon neg)  . Hx SBO 2004 , 2007, 2012     exploratory surgery 9-12   . Iritis 2005  . Osteoarthritis     Past Surgical History  Procedure Laterality Date  . Abdominal hysterectomy      in the 70s, for bleeding, no cancer  . Bilateral oophorectomy      per pt  . Total knee arthroplasty Right 12/11  .  Exploratory laparotomy  9-12     SBO    Social History:  reports that she has never smoked. She has never used smokeless tobacco. She reports that she does not drink alcohol or use illicit drugs.  Family History:  Family History  Problem Relation Age of Onset  . Diabetes Father   . Heart attack Neg Hx   . Breast cancer Sister   . Sudden death Sister   . Colon cancer Neg Hx   . Breast cancer Sister      Prior to Admission medications   Medication Sig Start Date End Date Taking? Authorizing Provider  aspirin 81 MG tablet Take 81 mg by mouth daily.     Yes Historical Provider, MD  ibuprofen (ADVIL,MOTRIN) 200 MG tablet Take 200 mg by mouth every 6 (six) hours as needed for moderate pain.   Yes Historical Provider, MD  metFORMIN (GLUCOPHAGE) 500 MG tablet Take 500 mg by mouth 2 (two) times daily with a meal.   Yes Historical Provider, MD  Multiple Vitamins-Minerals (CENTRUM SILVER PO) Take 1 capsule by mouth daily.     Yes Historical Provider, MD    Physical Exam: Filed Vitals:   07/11/14 0300 07/11/14 0315 07/11/14 0330 07/11/14 0345  BP: 130/63 125/63 128/64 129/69  Pulse: 84 86 89 84  Temp:      TempSrc:      Resp: 18 19 18  23  SpO2: 95% 94% 100% 97%   General: in mild acute distress, in pain.  HEENT:       Eyes: PERRL, EOMI, no scleral icterus       ENT: No discharge from the ears and nose, no pharynx injection, no tonsillar enlargement.        Neck: No JVD, no bruit, no mass felt. Cardiac: S1/S2, RRR, No murmurs, gallops or rubs Pulm: Good air movement bilaterally. Clear to auscultation bilaterally. No rales, wheezing, rhonchi or rubs. Abd: Soft, nondistended, diffused tenderness which is worse on the right side, no rebound pain, no organomegaly, BS present Ext: No edema. 2+DP/PT pulse bilaterally Musculoskeletal: No joint deformities, erythema, or stiffness, ROM full Skin: No rashes.  Neuro: Alert and oriented X3, cranial nerves II-XII grossly intact, muscle strength  5/5 in all extremeties, sensation to light touch intact. Psych: Patient is not psychotic, no suicidal or hemocidal ideation.  Labs on Admission:  Basic Metabolic Panel:  Recent Labs Lab 07/10/14 2152  NA 141  K 4.3  CL 102  CO2 27  GLUCOSE 156*  BUN 18  CREATININE 0.76  CALCIUM 9.5   Liver Function Tests:  Recent Labs Lab 07/10/14 2152  AST 16  ALT 13  ALKPHOS 60  BILITOT <0.2*  PROT 7.5  ALBUMIN 3.7    Recent Labs Lab 07/10/14 2152  LIPASE 27   No results found for this basename: AMMONIA,  in the last 168 hours CBC:  Recent Labs Lab 07/10/14 2152  WBC 10.0  NEUTROABS 5.7  HGB 11.9*  HCT 36.6  MCV 81.9  PLT 206   Cardiac Enzymes: No results found for this basename: CKTOTAL, CKMB, CKMBINDEX, TROPONINI,  in the last 168 hours  BNP (last 3 results) No results found for this basename: PROBNP,  in the last 8760 hours CBG: No results found for this basename: GLUCAP,  in the last 168 hours  Radiological Exams on Admission: Ct Abdomen Pelvis W Contrast  07/11/2014   CLINICAL DATA:  ABDOMINAL PAIN  EXAM: CT ABDOMEN AND PELVIS WITH CONTRAST  TECHNIQUE: Multidetector CT imaging of the abdomen and pelvis was performed using the standard protocol following bolus administration of intravenous contrast.  CONTRAST:  1103mL OMNIPAQUE IOHEXOL 300 MG/ML  SOLN  COMPARISON:  Prior CT from 12/08/2013  FINDINGS: Subsegmental atelectasis seen within the partially visualized lung bases. Few scattered partially calcified nodular density measuring up to 8 mm seen within the partially visualized right upper lobe (202, image 1), likely related to prior granulomatous infection. Subpleural nodules present within the right middle lobe and lingula measuring up to 7 mm are stable.  The liver demonstrates a normal contrast enhanced appearance. Gallbladder within normal limits. No biliary ductal dilatation. The spleen and adrenal glands demonstrate a normal contrast enhanced appearance. Fatty  atrophy noted within the pancreatic body. Pancreas is otherwise unremarkable.  The kidneys are equal in size with symmetric enhancement. No nephrolithiasis, hydronephrosis, or focal enhancing renal mass. No hydroureter.  There is mild soft tissue stranding about a dilated loop of small bowel within the right lower quadrant (series 01, image 61). Fecalization of the small bowel is seen at this level. The small bowel is decompressed distally. The small bowel is mildly dilated proximally with internal air-fluid levels. The small bowel itself measures within normal limits up to 3 cm in diameter at this time. Findings are suggestive of partial/early small bowel obstruction. The small bowel is decompressed distally. The colon is within normal limits without acute inflammatory changes. Scattered  sigmoid diverticulosis noted without acute diverticulitis.  Bladder is within normal limits. Uterus and ovaries are not visualized.  No free air or fluid. No pathologically enlarged intra-abdominal or pelvic lymph nodes.  Moderate atheromatous disease again noted within the intra-abdominal aorta. No aneurysm. Coronary artery calcifications noted.  No acute osseous abnormality. No worrisome lytic or blastic osseous lesions.  IMPRESSION: 1. Findings suggestive of partial versus early small bowel obstruction with transition point in the right lower quadrant as above. 2. Sigmoid diverticulosis without acute diverticulitis. 3. Stable subpleural nodules in the lung bases measuring up to 7 mm in the lateral segment of the right middle lobe. If the patient is at low risk for bronchogenic carcinoma, an additional follow-up an 6 months to 1 year could be considered. If the patient is at high risk for bronchogenic carcinoma an additional follow-up an 3 months could be considered to ensure stability. 4. Atherosclerosis with 3 vessel coronary artery disease.   Electronically Signed   By: Jeannine Boga M.D.   On: 07/11/2014 02:52     Assessment/Plan Principal Problem:   Small bowel obstruction Active Problems:   HYPERLIPIDEMIA   GERD   OSTEOARTHRITIS   1. Small bowel obstruction: This diagnosis is suggested by CT abdomen findings. This is most likely due to adhesive obstruction given that patient had several abdominal surgeries in the past. Currently patient is hemodynamically stable. Electrolyte is okay. No signs of sepsis.  -will admit to Med-surg bed -NPO  -NG tube  -morphine IV prn pain  -IVF NS: 125 cc/h - Zofran prn nausea  - check EKG for monitoring QT given the use of zofran - Surgeon was consulted by ED, will see in AM  2.  Diabetes type 2: Recent A1c was 6.7. Patient is on metformin at home - will d/c metformin - SSI  3. GERD: No acid reflux symptoms at this moment. Will monitor  4. Osteoarthritis: Symptoms are stable. On ibuprofen at home. -will hold ibuprofen when patient is on n.p.o.  5. Stable subpleural nodules in the lung bases:  measuring up to 7 mm in the lateral segment of the right middle lobe. Follow up as outpatient   DVT ppx: SQ Heparin      Code Status: Full code Family Communication: Yes, patient's goddaughter at bed side Disposition Plan: Admit to inpatient  Ivor Costa [facility] Pager [phone]  If 7PM-7AM, please contact night-coverage [email or web address] Password [facility] 07/11/2014, 3:54 AM

## 2014-07-11 NOTE — Progress Notes (Signed)
NG tube was placed this am at 0530 in right nare. Patient tolerated procedure very well.  NG tube accidentally came out when radiology came to do abd x-ray for placement.  Tried re-placement of NG tube in left nare high resistance even though patient cleared nostrils.  Tried right nare again met with resistance and bleeding.  Will give 30 mins or more and have on-coming staff try for replacement.

## 2014-07-11 NOTE — Consult Note (Signed)
Reason for Consult:SBO Referring Physician: Dr. Riley Lam is an 80 y.o. female.  HPI:  This is a pleasant 80 year old female with a previous history of small bowel obstructions status post exploratory laparotomy x3 with the last one being in 2012 by Dr. Brantley Stage. She developed abdominal pain late yesterday. It started slowly and then became 10 out of 10. The pain was cramping in nature. It was mostly in the lower abdomen. She then developed bloating, nausea, and vomiting. Since admission, her nasogastric tube has come out. She has had a bowel movement and now feels much better with almost no abnormal pain or nausea. Past Medical History  Diagnosis Date  . Diabetes mellitus   . GERD (gastroesophageal reflux disease)     pt denies  . Osteopenia   . Abnormal breast exam 2004    MMG neg (eval by surgeon neg)  . Hx SBO 2004 , 2007, 2012     exploratory surgery 9-12   . Iritis 2005  . Osteoarthritis     Past Surgical History  Procedure Laterality Date  . Abdominal hysterectomy      in the 70s, for bleeding, no cancer  . Bilateral oophorectomy      per pt  . Total knee arthroplasty Right 12/11  . Exploratory laparotomy  9-12     SBO    Family History  Problem Relation Age of Onset  . Diabetes Father   . Heart attack Neg Hx   . Breast cancer Sister   . Sudden death Sister   . Colon cancer Neg Hx   . Breast cancer Sister     Social History:  reports that she has never smoked. She has never used smokeless tobacco. She reports that she does not drink alcohol or use illicit drugs.  Allergies:  Allergies  Allergen Reactions  . Zocor [Simvastatin]     Ear ringing  . Lipitor [Atorvastatin]     tinnitus    Medications: I have reviewed the patient's current medications.  Results for orders placed during the hospital encounter of 07/10/14 (from the past 48 hour(s))  CBC WITH DIFFERENTIAL     Status: Abnormal   Collection Time    07/10/14  9:52 PM      Result  Value Ref Range   WBC 10.0  4.0 - 10.5 K/uL   RBC 4.47  3.87 - 5.11 MIL/uL   Hemoglobin 11.9 (*) 12.0 - 15.0 g/dL   HCT 36.6  36.0 - 46.0 %   MCV 81.9  78.0 - 100.0 fL   MCH 26.6  26.0 - 34.0 pg   MCHC 32.5  30.0 - 36.0 g/dL   RDW 13.6  11.5 - 15.5 %   Platelets 206  150 - 400 K/uL   Neutrophils Relative % 57  43 - 77 %   Neutro Abs 5.7  1.7 - 7.7 K/uL   Lymphocytes Relative 38  12 - 46 %   Lymphs Abs 3.8  0.7 - 4.0 K/uL   Monocytes Relative 4  3 - 12 %   Monocytes Absolute 0.4  0.1 - 1.0 K/uL   Eosinophils Relative 1  0 - 5 %   Eosinophils Absolute 0.1  0.0 - 0.7 K/uL   Basophils Relative 0  0 - 1 %   Basophils Absolute 0.0  0.0 - 0.1 K/uL  COMPREHENSIVE METABOLIC PANEL     Status: Abnormal   Collection Time    07/10/14  9:52 PM  Result Value Ref Range   Sodium 141  137 - 147 mEq/L   Potassium 4.3  3.7 - 5.3 mEq/L   Chloride 102  96 - 112 mEq/L   CO2 27  19 - 32 mEq/L   Glucose, Bld 156 (*) 70 - 99 mg/dL   BUN 18  6 - 23 mg/dL   Creatinine, Ser 0.76  0.50 - 1.10 mg/dL   Calcium 9.5  8.4 - 10.5 mg/dL   Total Protein 7.5  6.0 - 8.3 g/dL   Albumin 3.7  3.5 - 5.2 g/dL   AST 16  0 - 37 U/L   ALT 13  0 - 35 U/L   Alkaline Phosphatase 60  39 - 117 U/L   Total Bilirubin <0.2 (*) 0.3 - 1.2 mg/dL   GFR calc non Af Amer 77 (*) >90 mL/min   GFR calc Af Amer 89 (*) >90 mL/min   Comment: (NOTE)     The eGFR has been calculated using the CKD EPI equation.     This calculation has not been validated in all clinical situations.     eGFR's persistently <90 mL/min signify possible Chronic Kidney     Disease.   Anion gap 12  5 - 15  LIPASE, BLOOD     Status: None   Collection Time    07/10/14  9:52 PM      Result Value Ref Range   Lipase 27  11 - 59 U/L  URINALYSIS, ROUTINE W REFLEX MICROSCOPIC     Status: Abnormal   Collection Time    07/11/14  2:23 AM      Result Value Ref Range   Color, Urine YELLOW  YELLOW   APPearance TURBID (*) CLEAR   Specific Gravity, Urine 1.033 (*)  1.005 - 1.030   pH 7.5  5.0 - 8.0   Glucose, UA NEGATIVE  NEGATIVE mg/dL   Hgb urine dipstick NEGATIVE  NEGATIVE   Bilirubin Urine NEGATIVE  NEGATIVE   Ketones, ur NEGATIVE  NEGATIVE mg/dL   Protein, ur NEGATIVE  NEGATIVE mg/dL   Urobilinogen, UA 0.2  0.0 - 1.0 mg/dL   Nitrite NEGATIVE  NEGATIVE   Leukocytes, UA SMALL (*) NEGATIVE  URINE MICROSCOPIC-ADD ON     Status: Abnormal   Collection Time    07/11/14  2:23 AM      Result Value Ref Range   Squamous Epithelial / LPF MANY (*) RARE   WBC, UA 3-6  <3 WBC/hpf   Bacteria, UA MANY (*) RARE   Urine-Other AMORPHOUS URATES/PHOSPHATES    GLUCOSE, CAPILLARY     Status: Abnormal   Collection Time    07/11/14  4:52 AM      Result Value Ref Range   Glucose-Capillary 147 (*) 70 - 99 mg/dL   Comment 1 Notify RN    APTT     Status: None   Collection Time    07/11/14  5:50 AM      Result Value Ref Range   aPTT 29  24 - 37 seconds  PROTIME-INR     Status: None   Collection Time    07/11/14  5:50 AM      Result Value Ref Range   Prothrombin Time 14.0  11.6 - 15.2 seconds   INR 1.08  0.00 - 1.49  LACTIC ACID, PLASMA     Status: Abnormal   Collection Time    07/11/14  5:50 AM      Result Value Ref Range  Lactic Acid, Venous 2.8 (*) 0.5 - 2.2 mmol/L  GLUCOSE, CAPILLARY     Status: Abnormal   Collection Time    07/11/14  6:52 AM      Result Value Ref Range   Glucose-Capillary 156 (*) 70 - 99 mg/dL   Comment 1 Notify RN      Ct Abdomen Pelvis W Contrast  07/11/2014   CLINICAL DATA:  ABDOMINAL PAIN  EXAM: CT ABDOMEN AND PELVIS WITH CONTRAST  TECHNIQUE: Multidetector CT imaging of the abdomen and pelvis was performed using the standard protocol following bolus administration of intravenous contrast.  CONTRAST:  133m OMNIPAQUE IOHEXOL 300 MG/ML  SOLN  COMPARISON:  Prior CT from 12/08/2013  FINDINGS: Subsegmental atelectasis seen within the partially visualized lung bases. Few scattered partially calcified nodular density measuring up to 8  mm seen within the partially visualized right upper lobe (202, image 1), likely related to prior granulomatous infection. Subpleural nodules present within the right middle lobe and lingula measuring up to 7 mm are stable.  The liver demonstrates a normal contrast enhanced appearance. Gallbladder within normal limits. No biliary ductal dilatation. The spleen and adrenal glands demonstrate a normal contrast enhanced appearance. Fatty atrophy noted within the pancreatic body. Pancreas is otherwise unremarkable.  The kidneys are equal in size with symmetric enhancement. No nephrolithiasis, hydronephrosis, or focal enhancing renal mass. No hydroureter.  There is mild soft tissue stranding about a dilated loop of small bowel within the right lower quadrant (series 01, image 61). Fecalization of the small bowel is seen at this level. The small bowel is decompressed distally. The small bowel is mildly dilated proximally with internal air-fluid levels. The small bowel itself measures within normal limits up to 3 cm in diameter at this time. Findings are suggestive of partial/early small bowel obstruction. The small bowel is decompressed distally. The colon is within normal limits without acute inflammatory changes. Scattered sigmoid diverticulosis noted without acute diverticulitis.  Bladder is within normal limits. Uterus and ovaries are not visualized.  No free air or fluid. No pathologically enlarged intra-abdominal or pelvic lymph nodes.  Moderate atheromatous disease again noted within the intra-abdominal aorta. No aneurysm. Coronary artery calcifications noted.  No acute osseous abnormality. No worrisome lytic or blastic osseous lesions.  IMPRESSION: 1. Findings suggestive of partial versus early small bowel obstruction with transition point in the right lower quadrant as above. 2. Sigmoid diverticulosis without acute diverticulitis. 3. Stable subpleural nodules in the lung bases measuring up to 7 mm in the lateral  segment of the right middle lobe. If the patient is at low risk for bronchogenic carcinoma, an additional follow-up an 6 months to 1 year could be considered. If the patient is at high risk for bronchogenic carcinoma an additional follow-up an 3 months could be considered to ensure stability. 4. Atherosclerosis with 3 vessel coronary artery disease.   Electronically Signed   By: BJeannine BogaM.D.   On: 07/11/2014 02:52    Review of Systems  All other systems reviewed and are negative.  Blood pressure 151/75, pulse 91, temperature 98.9 F (37.2 C), temperature source Oral, resp. rate 15, weight 202 lb (91.627 kg), SpO2 100.00%. Physical Exam  Constitutional: She is oriented to person, place, and time. She appears well-developed and well-nourished. No distress.  HENT:  Head: Normocephalic and atraumatic.  Right Ear: External ear normal.  Left Ear: External ear normal.  Nose: Nose normal.  Mouth/Throat: Oropharynx is clear and moist.  Eyes: Conjunctivae are normal. Pupils are  equal, round, and reactive to light. Right eye exhibits no discharge. Left eye exhibits no discharge. No scleral icterus.  Neck: Normal range of motion. Neck supple. No tracheal deviation present.  Cardiovascular: Normal rate, regular rhythm, normal heart sounds and intact distal pulses.   No murmur heard. Respiratory: Effort normal and breath sounds normal. No respiratory distress. She has no wheezes.  GI: Soft.  Her abdomen is soft. There is minimal tenderness but no guarding. There is a mild fullness to her abdomen. There are no obvious hernias  Musculoskeletal: Normal range of motion. She exhibits no edema and no tenderness.  Lymphadenopathy:    She has no cervical adenopathy.  Neurological: She is alert and oriented to person, place, and time.  Skin: Skin is warm and dry. No rash noted. No erythema.  Psychiatric: Her behavior is normal. Judgment normal.    Assessment/Plan: Partial small bowel  obstruction  Given her clinical appearance currently, this may be resolving with conservative management already. I believe we can hold on replacement of the nasogastric tube. If she starts to have nausea, vomiting, bloating, or an increase in her abdominal pain, the tube would need to be replaced. We will continue her bowel rest and IV rehydration. She may be ready to start liquids later today if she remains stable. We will follow her with you.  Suriya Kovarik A 07/11/2014, 9:14 AM

## 2014-07-12 ENCOUNTER — Inpatient Hospital Stay (HOSPITAL_COMMUNITY): Payer: Medicare Other

## 2014-07-12 DIAGNOSIS — K56609 Unspecified intestinal obstruction, unspecified as to partial versus complete obstruction: Principal | ICD-10-CM

## 2014-07-12 DIAGNOSIS — E876 Hypokalemia: Secondary | ICD-10-CM

## 2014-07-12 DIAGNOSIS — E119 Type 2 diabetes mellitus without complications: Secondary | ICD-10-CM

## 2014-07-12 LAB — CBC
HCT: 33 % — ABNORMAL LOW (ref 36.0–46.0)
Hemoglobin: 10.9 g/dL — ABNORMAL LOW (ref 12.0–15.0)
MCH: 27.1 pg (ref 26.0–34.0)
MCHC: 33 g/dL (ref 30.0–36.0)
MCV: 82.1 fL (ref 78.0–100.0)
Platelets: 187 10*3/uL (ref 150–400)
RBC: 4.02 MIL/uL (ref 3.87–5.11)
RDW: 13.8 % (ref 11.5–15.5)
WBC: 7 10*3/uL (ref 4.0–10.5)

## 2014-07-12 LAB — GLUCOSE, CAPILLARY
GLUCOSE-CAPILLARY: 86 mg/dL (ref 70–99)
GLUCOSE-CAPILLARY: 117 mg/dL — AB (ref 70–99)
Glucose-Capillary: 129 mg/dL — ABNORMAL HIGH (ref 70–99)
Glucose-Capillary: 130 mg/dL — ABNORMAL HIGH (ref 70–99)
Glucose-Capillary: 137 mg/dL — ABNORMAL HIGH (ref 70–99)
Glucose-Capillary: 99 mg/dL (ref 70–99)

## 2014-07-12 LAB — BASIC METABOLIC PANEL
Anion gap: 12 (ref 5–15)
BUN: 10 mg/dL (ref 6–23)
CALCIUM: 8.2 mg/dL — AB (ref 8.4–10.5)
CO2: 24 mEq/L (ref 19–32)
Chloride: 106 mEq/L (ref 96–112)
Creatinine, Ser: 0.74 mg/dL (ref 0.50–1.10)
GFR calc Af Amer: >90 mL/min (ref >90)
GFR, EST NON AFRICAN AMERICAN: 78 mL/min — AB (ref >90)
GLUCOSE: 126 mg/dL — AB (ref 70–99)
Potassium: 3.6 mEq/L — ABNORMAL LOW (ref 3.7–5.3)
Sodium: 142 mEq/L (ref 137–147)

## 2014-07-12 MED ORDER — POTASSIUM CHLORIDE CRYS ER 20 MEQ PO TBCR
40.0000 meq | EXTENDED_RELEASE_TABLET | Freq: Once | ORAL | Status: AC
  Administered 2014-07-12: 40 meq via ORAL
  Filled 2014-07-12: qty 2

## 2014-07-12 MED ORDER — BISACODYL 10 MG RE SUPP
10.0000 mg | Freq: Once | RECTAL | Status: AC
  Administered 2014-07-12: 10 mg via RECTAL
  Filled 2014-07-12: qty 1

## 2014-07-12 NOTE — Progress Notes (Signed)
[facility] PROGRESS NOTE  [name] TIR:[id number] DOB: 1930-07-06 DOA: 07/10/2014  PCP: Kathlene November, MD  Brief HPI: 80yo female presented with abdominal pain, nausea and vomiting. She was found to have small bowel obstruction and was admitted.  Past medical history:  Past Medical History  Diagnosis Date  . Osteopenia   . Abnormal breast exam 2004    MMG neg (eval by surgeon neg)  . Hx SBO 2004 , 2007, 2012     exploratory surgery 9-12   . Iritis 2005  . Type II diabetes mellitus   . Anemia   . History of blood transfusion     "related to a surgery, I think"  . Osteoarthritis   . Arthritis     "knees" (07/11/2014)    Consultants: General Surgery  Procedures: None  Antibiotics: None  Subjective: Patient feels better. Wants to move around. Passing flatus. No pain or n/v.  Objective: Vital Signs  Filed Vitals:   07/11/14 2128 07/12/14 0429 07/12/14 0550 07/12/14 1329  BP: 130/68  140/70 122/67  Pulse: 76  69 72  Temp: 98.1 F (36.7 C)  97.9 F (36.6 C) 98.4 F (36.9 C)  TempSrc:    Oral  Resp: 18  18 16   Weight:  93.396 kg (205 lb 14.4 oz)    SpO2: 98%  98% 98%    Intake/Output Summary (Last 24 hours) at 07/12/14 1333 Last data filed at 07/12/14 1330  Gross per 24 hour  Intake 1703.75 ml  Output      0 ml  Net 1703.75 ml   Filed Weights   07/11/14 0616 07/12/14 0429  Weight: 91.627 kg (202 lb) 93.396 kg (205 lb 14.4 oz)    General appearance: alert, cooperative, appears stated age and no distress Resp: clear to auscultation bilaterally Cardio: regular rate and rhythm, S1, S2 normal, no murmur, click, rub or gallop GI: soft, non-tender; bowel sounds normal; no masses,  no organomegaly Neurologic: No focal deficits.  Lab Results:  Basic Metabolic Panel:  Recent Labs Lab 07/10/14 2152 07/12/14 0455  NA 141 142  K 4.3 3.6*  CL 102 106  CO2 27 24  GLUCOSE 156* 126*  BUN 18 10  CREATININE 0.76 0.74  CALCIUM 9.5 8.2*   Liver  Function Tests:  Recent Labs Lab 07/10/14 2152  AST 16  ALT 13  ALKPHOS 60  BILITOT <0.2*  PROT 7.5  ALBUMIN 3.7    Recent Labs Lab 07/10/14 2152  LIPASE 27   CBC:  Recent Labs Lab 07/10/14 2152 07/12/14 0455  WBC 10.0 7.0  NEUTROABS 5.7  --   HGB 11.9* 10.9*  HCT 36.6 33.0*  MCV 81.9 82.1  PLT 206 187   CBG:  Recent Labs Lab 07/11/14 2054 07/12/14 0006 07/12/14 0401 07/12/14 0810 07/12/14 1137  GLUCAP 97 130* 129* 99 137*      Studies/Results: Ct Abdomen Pelvis W Contrast  07/11/2014   CLINICAL DATA:  ABDOMINAL PAIN  EXAM: CT ABDOMEN AND PELVIS WITH CONTRAST  TECHNIQUE: Multidetector CT imaging of the abdomen and pelvis was performed using the standard protocol following bolus administration of intravenous contrast.  CONTRAST:  127mL OMNIPAQUE IOHEXOL 300 MG/ML  SOLN  COMPARISON:  Prior CT from 12/08/2013  FINDINGS: Subsegmental atelectasis seen within the partially visualized lung bases. Few scattered partially calcified nodular density measuring up to 8 mm seen within the partially visualized right upper lobe (202, image 1), likely related to prior granulomatous infection. Subpleural nodules present within the right  middle lobe and lingula measuring up to 7 mm are stable.  The liver demonstrates a normal contrast enhanced appearance. Gallbladder within normal limits. No biliary ductal dilatation. The spleen and adrenal glands demonstrate a normal contrast enhanced appearance. Fatty atrophy noted within the pancreatic body. Pancreas is otherwise unremarkable.  The kidneys are equal in size with symmetric enhancement. No nephrolithiasis, hydronephrosis, or focal enhancing renal mass. No hydroureter.  There is mild soft tissue stranding about a dilated loop of small bowel within the right lower quadrant (series 01, image 61). Fecalization of the small bowel is seen at this level. The small bowel is decompressed distally. The small bowel is mildly dilated proximally with  internal air-fluid levels. The small bowel itself measures within normal limits up to 3 cm in diameter at this time. Findings are suggestive of partial/early small bowel obstruction. The small bowel is decompressed distally. The colon is within normal limits without acute inflammatory changes. Scattered sigmoid diverticulosis noted without acute diverticulitis.  Bladder is within normal limits. Uterus and ovaries are not visualized.  No free air or fluid. No pathologically enlarged intra-abdominal or pelvic lymph nodes.  Moderate atheromatous disease again noted within the intra-abdominal aorta. No aneurysm. Coronary artery calcifications noted.  No acute osseous abnormality. No worrisome lytic or blastic osseous lesions.  IMPRESSION: 1. Findings suggestive of partial versus early small bowel obstruction with transition point in the right lower quadrant as above. 2. Sigmoid diverticulosis without acute diverticulitis. 3. Stable subpleural nodules in the lung bases measuring up to 7 mm in the lateral segment of the right middle lobe. If the patient is at low risk for bronchogenic carcinoma, an additional follow-up an 6 months to 1 year could be considered. If the patient is at high risk for bronchogenic carcinoma an additional follow-up an 3 months could be considered to ensure stability. 4. Atherosclerosis with 3 vessel coronary artery disease.   Electronically Signed   By: Jeannine Boga M.D.   On: 07/11/2014 02:52   Dg Abd Portable 1v  07/12/2014   CLINICAL DATA:  Partial small bowel obstruction, followup  EXAM: PORTABLE ABDOMEN - 1 VIEW  COMPARISON:  CT abdomen pelvis of 07/11/2014  FINDINGS: No significant gaseous distention of small bowel is seen, indicating improvement in partial SBO noted on recent CT of the abdomen pelvis. A small amount of contrast is noted within the nondistended right colon. No opaque calculi are noted.  IMPRESSION: No significant small bowel distention is seen.   Electronically  Signed   By: Ivar Drape M.D.   On: 07/12/2014 08:06    Medications:  Scheduled: . aspirin  81 mg Oral Daily  . heparin  5,000 Units Subcutaneous 3 times per day  . insulin aspart  0-9 Units Subcutaneous 6 times per day   Continuous: . sodium chloride 10 mL/hr at 07/12/14 0916   WVP:XTGGYIRS injection, ondansetron  Assessment/Plan:  Principal Problem:   Small bowel obstruction Active Problems:   GERD   OSTEOARTHRITIS    Small bowel obstruction Seems to be improving. Does not have a NG tube. At this time it is ok to advance diet. Await general surgery input. Mobilize.   History of Diabetes type 2 Recent A1c was 6.7. Patient is on metformin at home. Continue with insulin sliding scale.   GERD:  No acid reflux symptoms at this moment.   Osteoarthritis:  Stable.   Stable subpleural nodules in the lung bases:  Measuring up to 7 mm in the lateral segment of the  right middle lobe. Follow up as outpatient   Atherosclerosis noted on CT OP follow up for same.  Hypokalemia Replete orally.  DVT Prophylaxis: Heparin    Code Status: Full Code  Family Communication: No family at bedisde  Disposition Plan: Possible DC 9/10.    LOS: 2 days   [facility] Pager [phone] 07/12/2014, 1:33 PM  If 8PM-8AM, please contact night-coverage at [email or web address], password [email or web address]   Disclaimer: This note was dictated with voice recognition software. Similar sounding words can inadvertently be transcribed and may not be corrected upon review.

## 2014-07-12 NOTE — Progress Notes (Signed)
Patient ID: [name], female   DOB: 1929-12-26, 80 y.o.   MRN: [id number]     [address] [place], [place] [address]    Phone: [phone] FAX: [phone]     Subjective: Passing flatus.  No n/v.  No BM.  She is hungry and wants to mobilize. Stable labs.  Afebrile.  AXR with resolved SBO.    Objective:  Vital signs:  Filed Vitals:   07/11/14 1339 07/11/14 2128 07/12/14 0429 07/12/14 0550  BP: 125/67 130/68  140/70  Pulse: 75 76  69  Temp: 98.1 F (36.7 C) 98.1 F (36.7 C)  97.9 F (36.6 C)  TempSrc:      Resp: 16 18  18   Weight:   205 lb 14.4 oz (93.396 kg)   SpO2: 98% 98%  98%    Last BM Date: 07/10/14  Intake/Output   Yesterday:  09/08 0701 - 09/09 0700 In: 1343.8 [I.V.:1343.8] Out: -  This shift:    I/O last 3 completed shifts: In: 1843.8 [I.V.:1343.8; IV Piggyback:500] Out: -     Physical Exam: General: Pt awake/alert/oriented x4 in no acute distressy Abdomen: Soft.  Nondistended.  Minimal ttp epigastric and rlq.  No evidence of peritonitis.  No incarcerated hernias.   Problem List:   Principal Problem:   Small bowel obstruction Active Problems:   GERD   OSTEOARTHRITIS    Results:   Labs: Results for orders placed during the hospital encounter of 07/10/14 (from the past 48 hour(s))  CBC WITH DIFFERENTIAL     Status: Abnormal   Collection Time    07/10/14  9:52 PM      Result Value Ref Range   WBC 10.0  4.0 - 10.5 K/uL   RBC 4.47  3.87 - 5.11 MIL/uL   Hemoglobin 11.9 (*) 12.0 - 15.0 g/dL   HCT 36.6  36.0 - 46.0 %   MCV 81.9  78.0 - 100.0 fL   MCH 26.6  26.0 - 34.0 pg   MCHC 32.5  30.0 - 36.0 g/dL   RDW 13.6  11.5 - 15.5 %   Platelets 206  150 - 400 K/uL   Neutrophils Relative % 57  43 - 77 %   Neutro Abs 5.7  1.7 - 7.7 K/uL   Lymphocytes Relative 38  12 - 46 %   Lymphs Abs 3.8  0.7 - 4.0 K/uL   Monocytes Relative 4  3 - 12 %   Monocytes Absolute 0.4  0.1 - 1.0  K/uL   Eosinophils Relative 1  0 - 5 %   Eosinophils Absolute 0.1  0.0 - 0.7 K/uL   Basophils Relative 0  0 - 1 %   Basophils Absolute 0.0  0.0 - 0.1 K/uL  COMPREHENSIVE METABOLIC PANEL     Status: Abnormal   Collection Time    07/10/14  9:52 PM      Result Value Ref Range   Sodium 141  137 - 147 mEq/L   Potassium 4.3  3.7 - 5.3 mEq/L   Chloride 102  96 - 112 mEq/L   CO2 27  19 - 32 mEq/L   Glucose, Bld 156 (*) 70 - 99 mg/dL   BUN 18  6 - 23 mg/dL   Creatinine, Ser 0.76  0.50 - 1.10 mg/dL   Calcium 9.5  8.4 - 10.5 mg/dL   Total Protein 7.5  6.0 - 8.3 g/dL   Albumin 3.7  3.5 - 5.2 g/dL   AST 16  0 - 37 U/L   ALT 13  0 - 35 U/L   Alkaline Phosphatase 60  39 - 117 U/L   Total Bilirubin <0.2 (*) 0.3 - 1.2 mg/dL   GFR calc non Af Amer 77 (*) >90 mL/min   GFR calc Af Amer 89 (*) >90 mL/min   Comment: (NOTE)     The eGFR has been calculated using the CKD EPI equation.     This calculation has not been validated in all clinical situations.     eGFR's persistently <90 mL/min signify possible Chronic Kidney     Disease.   Anion gap 12  5 - 15  LIPASE, BLOOD     Status: None   Collection Time    07/10/14  9:52 PM      Result Value Ref Range   Lipase 27  11 - 59 U/L  URINALYSIS, ROUTINE W REFLEX MICROSCOPIC     Status: Abnormal   Collection Time    07/11/14  2:23 AM      Result Value Ref Range   Color, Urine YELLOW  YELLOW   APPearance TURBID (*) CLEAR   Specific Gravity, Urine 1.033 (*) 1.005 - 1.030   pH 7.5  5.0 - 8.0   Glucose, UA NEGATIVE  NEGATIVE mg/dL   Hgb urine dipstick NEGATIVE  NEGATIVE   Bilirubin Urine NEGATIVE  NEGATIVE   Ketones, ur NEGATIVE  NEGATIVE mg/dL   Protein, ur NEGATIVE  NEGATIVE mg/dL   Urobilinogen, UA 0.2  0.0 - 1.0 mg/dL   Nitrite NEGATIVE  NEGATIVE   Leukocytes, UA SMALL (*) NEGATIVE  URINE MICROSCOPIC-ADD ON     Status: Abnormal   Collection Time    07/11/14  2:23 AM      Result Value Ref Range   Squamous Epithelial / LPF MANY (*) RARE    WBC, UA 3-6  <3 WBC/hpf   Bacteria, UA MANY (*) RARE   Urine-Other AMORPHOUS URATES/PHOSPHATES    GLUCOSE, CAPILLARY     Status: Abnormal   Collection Time    07/11/14  4:52 AM      Result Value Ref Range   Glucose-Capillary 147 (*) 70 - 99 mg/dL   Comment 1 Notify RN    APTT     Status: None   Collection Time    07/11/14  5:50 AM      Result Value Ref Range   aPTT 29  24 - 37 seconds  PROTIME-INR     Status: None   Collection Time    07/11/14  5:50 AM      Result Value Ref Range   Prothrombin Time 14.0  11.6 - 15.2 seconds   INR 1.08  0.00 - 1.49  LACTIC ACID, PLASMA     Status: Abnormal   Collection Time    07/11/14  5:50 AM      Result Value Ref Range   Lactic Acid, Venous 2.8 (*) 0.5 - 2.2 mmol/L  GLUCOSE, CAPILLARY     Status: Abnormal   Collection Time    07/11/14  6:52 AM      Result Value Ref Range   Glucose-Capillary 156 (*) 70 - 99 mg/dL   Comment 1 Notify RN    GLUCOSE, CAPILLARY     Status: Abnormal   Collection Time    07/11/14 10:28 AM      Result Value Ref Range   Glucose-Capillary 103 (*) 70 - 99 mg/dL  GLUCOSE, CAPILLARY     Status: None   Collection Time    07/11/14  1:21 PM      Result Value Ref Range   Glucose-Capillary 72  70 - 99 mg/dL  GLUCOSE, CAPILLARY     Status: None   Collection Time    07/11/14  4:18 PM      Result Value Ref Range   Glucose-Capillary 75  70 - 99 mg/dL  GLUCOSE, CAPILLARY     Status: None   Collection Time    07/11/14  8:54 PM      Result Value Ref Range   Glucose-Capillary 97  70 - 99 mg/dL   Comment 1 Notify RN    GLUCOSE, CAPILLARY     Status: Abnormal   Collection Time    07/12/14 12:06 AM      Result Value Ref Range   Glucose-Capillary 130 (*) 70 - 99 mg/dL  GLUCOSE, CAPILLARY     Status: Abnormal   Collection Time    07/12/14  4:01 AM      Result Value Ref Range   Glucose-Capillary 129 (*) 70 - 99 mg/dL   Comment 1 Notify RN    CBC     Status: Abnormal   Collection Time    07/12/14  4:55 AM       Result Value Ref Range   WBC 7.0  4.0 - 10.5 K/uL   RBC 4.02  3.87 - 5.11 MIL/uL   Hemoglobin 10.9 (*) 12.0 - 15.0 g/dL   HCT 33.0 (*) 36.0 - 46.0 %   MCV 82.1  78.0 - 100.0 fL   MCH 27.1  26.0 - 34.0 pg   MCHC 33.0  30.0 - 36.0 g/dL   RDW 13.8  11.5 - 15.5 %   Platelets 187  150 - 400 K/uL  BASIC METABOLIC PANEL     Status: Abnormal   Collection Time    07/12/14  4:55 AM      Result Value Ref Range   Sodium 142  137 - 147 mEq/L   Potassium 3.6 (*) 3.7 - 5.3 mEq/L   Chloride 106  96 - 112 mEq/L   CO2 24  19 - 32 mEq/L   Glucose, Bld 126 (*) 70 - 99 mg/dL   BUN 10  6 - 23 mg/dL   Creatinine, Ser 0.74  0.50 - 1.10 mg/dL   Calcium 8.2 (*) 8.4 - 10.5 mg/dL   GFR calc non Af Amer 78 (*) >90 mL/min   GFR calc Af Amer >90  >90 mL/min   Comment: (NOTE)     The eGFR has been calculated using the CKD EPI equation.     This calculation has not been validated in all clinical situations.     eGFR's persistently <90 mL/min signify possible Chronic Kidney     Disease.   Anion gap 12  5 - 15  GLUCOSE, CAPILLARY     Status: None   Collection Time    07/12/14  8:10 AM      Result Value Ref Range   Glucose-Capillary 99  70 - 99 mg/dL    Imaging / Studies: Ct Abdomen Pelvis W Contrast  07/11/2014   CLINICAL DATA:  ABDOMINAL PAIN  EXAM: CT ABDOMEN AND PELVIS WITH CONTRAST  TECHNIQUE: Multidetector CT imaging of the abdomen and pelvis was performed using the standard protocol following bolus administration of intravenous contrast.  CONTRAST:  157m OMNIPAQUE IOHEXOL 300 MG/ML  SOLN  COMPARISON:  Prior CT  from 12/08/2013  FINDINGS: Subsegmental atelectasis seen within the partially visualized lung bases. Few scattered partially calcified nodular density measuring up to 8 mm seen within the partially visualized right upper lobe (202, image 1), likely related to prior granulomatous infection. Subpleural nodules present within the right middle lobe and lingula measuring up to 7 mm are stable.  The liver  demonstrates a normal contrast enhanced appearance. Gallbladder within normal limits. No biliary ductal dilatation. The spleen and adrenal glands demonstrate a normal contrast enhanced appearance. Fatty atrophy noted within the pancreatic body. Pancreas is otherwise unremarkable.  The kidneys are equal in size with symmetric enhancement. No nephrolithiasis, hydronephrosis, or focal enhancing renal mass. No hydroureter.  There is mild soft tissue stranding about a dilated loop of small bowel within the right lower quadrant (series 01, image 61). Fecalization of the small bowel is seen at this level. The small bowel is decompressed distally. The small bowel is mildly dilated proximally with internal air-fluid levels. The small bowel itself measures within normal limits up to 3 cm in diameter at this time. Findings are suggestive of partial/early small bowel obstruction. The small bowel is decompressed distally. The colon is within normal limits without acute inflammatory changes. Scattered sigmoid diverticulosis noted without acute diverticulitis.  Bladder is within normal limits. Uterus and ovaries are not visualized.  No free air or fluid. No pathologically enlarged intra-abdominal or pelvic lymph nodes.  Moderate atheromatous disease again noted within the intra-abdominal aorta. No aneurysm. Coronary artery calcifications noted.  No acute osseous abnormality. No worrisome lytic or blastic osseous lesions.  IMPRESSION: 1. Findings suggestive of partial versus early small bowel obstruction with transition point in the right lower quadrant as above. 2. Sigmoid diverticulosis without acute diverticulitis. 3. Stable subpleural nodules in the lung bases measuring up to 7 mm in the lateral segment of the right middle lobe. If the patient is at low risk for bronchogenic carcinoma, an additional follow-up an 6 months to 1 year could be considered. If the patient is at high risk for bronchogenic carcinoma an additional  follow-up an 3 months could be considered to ensure stability. 4. Atherosclerosis with 3 vessel coronary artery disease.   Electronically Signed   By: Jeannine Boga M.D.   On: 07/11/2014 02:52   Dg Abd Portable 1v  07/12/2014   CLINICAL DATA:  Partial small bowel obstruction, followup  EXAM: PORTABLE ABDOMEN - 1 VIEW  COMPARISON:  CT abdomen pelvis of 07/11/2014  FINDINGS: No significant gaseous distention of small bowel is seen, indicating improvement in partial SBO noted on recent CT of the abdomen pelvis. A small amount of contrast is noted within the nondistended right colon. No opaque calculi are noted.  IMPRESSION: No significant small bowel distention is seen.   Electronically Signed   By: Ivar Drape M.D.   On: 07/12/2014 08:06    Medications / Allergies:  Scheduled Meds: . aspirin  81 mg Oral Daily  . heparin  5,000 Units Subcutaneous 3 times per day  . insulin aspart  0-9 Units Subcutaneous 6 times per day  . potassium chloride  40 mEq Oral Once   Continuous Infusions: . sodium chloride Stopped (07/11/14 1745)   PRN Meds:.morphine injection, ondansetron  Antibiotics: Anti-infectives   None      Assessment/Plan PSBO -resolving, passing flatus, no n/v, no NGT and AXR without SB dilatation  -start clears and advance as tolerated  -dulcolax x1 -needs to mobilize   Erby Pian, [facility] Pager [phone](7A-4:30P)  For consults and floor pages call [phone](7A-4:30P)  07/12/2014 9:47 AM

## 2014-07-12 NOTE — Progress Notes (Signed)
I have seen and examined the patient and agree with the assessment and plans. Starting diet  Rayola Everhart A. Ninfa Linden  MD, FACS

## 2014-07-13 LAB — BASIC METABOLIC PANEL
Anion gap: 11 (ref 5–15)
BUN: 7 mg/dL (ref 6–23)
CO2: 26 mEq/L (ref 19–32)
Calcium: 8.8 mg/dL (ref 8.4–10.5)
Chloride: 107 mEq/L (ref 96–112)
Creatinine, Ser: 0.72 mg/dL (ref 0.50–1.10)
GFR calc Af Amer: >90 mL/min (ref >90)
GFR calc non Af Amer: 78 mL/min — ABNORMAL LOW (ref >90)
Glucose, Bld: 96 mg/dL (ref 70–99)
POTASSIUM: 4.2 meq/L (ref 3.7–5.3)
SODIUM: 144 meq/L (ref 137–147)

## 2014-07-13 LAB — CBC
HEMATOCRIT: 34.4 % — AB (ref 36.0–46.0)
Hemoglobin: 11.1 g/dL — ABNORMAL LOW (ref 12.0–15.0)
MCH: 27.4 pg (ref 26.0–34.0)
MCHC: 32.3 g/dL (ref 30.0–36.0)
MCV: 84.9 fL (ref 78.0–100.0)
Platelets: 191 10*3/uL (ref 150–400)
RBC: 4.05 MIL/uL (ref 3.87–5.11)
RDW: 13.8 % (ref 11.5–15.5)
WBC: 6.8 10*3/uL (ref 4.0–10.5)

## 2014-07-13 LAB — GLUCOSE, CAPILLARY
GLUCOSE-CAPILLARY: 98 mg/dL (ref 70–99)
Glucose-Capillary: 137 mg/dL — ABNORMAL HIGH (ref 70–99)

## 2014-07-13 MED ORDER — ONDANSETRON 4 MG PO TBDP: 4.0000 mg | ORAL_TABLET | Freq: Three times a day (TID) | ORAL | Status: DC | PRN

## 2014-07-13 MED ORDER — DOCUSATE SODIUM 100 MG PO CAPS: 100.0000 mg | ORAL_CAPSULE | Freq: Two times a day (BID) | ORAL | Status: DC

## 2014-07-13 NOTE — Discharge Summary (Signed)
[facility]  Physician Discharge Summary   Patient ID: [name] MRN: [id number] DOB/AGE: [id number] 78 y.o.  Admit date: 07/10/2014 Discharge date: 07/13/2014  PCP: Kathlene November, MD  DISCHARGE DIAGNOSES:  Principal Problem:   Small bowel obstruction Active Problems:   GERD   OSTEOARTHRITIS   RECOMMENDATIONS FOR OUTPATIENT FOLLOW UP: 1. Patient to avoid constipation.  2. Please follow up for abnormalities noted on CT including subpleural pulmonary nodules and atherosclerosis,  DISCHARGE CONDITION: fair  Diet recommendation: Soft diabetic diet for 4 days and then resume usual diet.  Filed Weights   07/11/14 0616 07/12/14 0429  Weight: 91.627 kg (202 lb) 93.396 kg (205 lb 14.4 oz)    INITIAL HISTORY: 80yo female presented with abdominal pain, nausea and vomiting. She was found to have small bowel obstruction and was admitted.  Consultations:  General Surgery  Procedures:  None  HOSPITAL COURSE:   Small bowel obstruction  Patient presented with above mentioned symptoms. She was admitted. There was problem placing NG tube. But patient improved without one. She was seen by general surgery. Her diet was advanced once she started passing flatus. She has tolerated liquids. If she tolerates lunch today, she could be discharged home. She should avoid constipation. Colace recommended.  History of Diabetes type 2  Recent A1c was 6.7. Patient is on metformin at home which she can resume.   GERD:  No acid reflux symptoms at this moment.   Osteoarthritis:  Stable.   Stable subpleural nodules in the lung bases:  Measuring up to 7 mm in the lateral segment of the right middle lobe. Follow up as outpatient   Atherosclerosis noted on CT  OP follow up for same.   Hypokalemia  Repleted orally.   Patient is feeling better. Some cramps after having coffee. Denies abdominal pain, nausea or vomiting. She can be discharged of she tolerates her lunch.   PERTINENT  LABS:  The results of significant diagnostics from this hospitalization (including imaging, microbiology, ancillary and laboratory) are listed below for reference.     Labs: Basic Metabolic Panel:  Recent Labs Lab 07/10/14 2152 07/12/14 0455 07/13/14 0629  NA 141 142 144  K 4.3 3.6* 4.2  CL 102 106 107  CO2 27 24 26   GLUCOSE 156* 126* 96  BUN 18 10 7   CREATININE 0.76 0.74 0.72  CALCIUM 9.5 8.2* 8.8   Liver Function Tests:  Recent Labs Lab 07/10/14 2152  AST 16  ALT 13  ALKPHOS 60  BILITOT <0.2*  PROT 7.5  ALBUMIN 3.7    Recent Labs Lab 07/10/14 2152  LIPASE 27   CBC:  Recent Labs Lab 07/10/14 2152 07/12/14 0455 07/13/14 0629  WBC 10.0 7.0 6.8  NEUTROABS 5.7  --   --   HGB 11.9* 10.9* 11.1*  HCT 36.6 33.0* 34.4*  MCV 81.9 82.1 84.9  PLT 206 187 191   CBG:  Recent Labs Lab 07/12/14 0810 07/12/14 1137 07/12/14 1618 07/12/14 2127 07/13/14 0642  GLUCAP 99 137* 86 117* 98     IMAGING STUDIES Ct Abdomen Pelvis W Contrast  07/11/2014   CLINICAL DATA:  ABDOMINAL PAIN  EXAM: CT ABDOMEN AND PELVIS WITH CONTRAST  TECHNIQUE: Multidetector CT imaging of the abdomen and pelvis was performed using the standard protocol following bolus administration of intravenous contrast.  CONTRAST:  167mL OMNIPAQUE IOHEXOL 300 MG/ML  SOLN  COMPARISON:  Prior CT from 12/08/2013  FINDINGS: Subsegmental atelectasis seen within the partially visualized lung bases. Few scattered partially calcified nodular  density measuring up to 8 mm seen within the partially visualized right upper lobe (202, image 1), likely related to prior granulomatous infection. Subpleural nodules present within the right middle lobe and lingula measuring up to 7 mm are stable.  The liver demonstrates a normal contrast enhanced appearance. Gallbladder within normal limits. No biliary ductal dilatation. The spleen and adrenal glands demonstrate a normal contrast enhanced appearance. Fatty atrophy noted within  the pancreatic body. Pancreas is otherwise unremarkable.  The kidneys are equal in size with symmetric enhancement. No nephrolithiasis, hydronephrosis, or focal enhancing renal mass. No hydroureter.  There is mild soft tissue stranding about a dilated loop of small bowel within the right lower quadrant (series 01, image 61). Fecalization of the small bowel is seen at this level. The small bowel is decompressed distally. The small bowel is mildly dilated proximally with internal air-fluid levels. The small bowel itself measures within normal limits up to 3 cm in diameter at this time. Findings are suggestive of partial/early small bowel obstruction. The small bowel is decompressed distally. The colon is within normal limits without acute inflammatory changes. Scattered sigmoid diverticulosis noted without acute diverticulitis.  Bladder is within normal limits. Uterus and ovaries are not visualized.  No free air or fluid. No pathologically enlarged intra-abdominal or pelvic lymph nodes.  Moderate atheromatous disease again noted within the intra-abdominal aorta. No aneurysm. Coronary artery calcifications noted.  No acute osseous abnormality. No worrisome lytic or blastic osseous lesions.  IMPRESSION: 1. Findings suggestive of partial versus early small bowel obstruction with transition point in the right lower quadrant as above. 2. Sigmoid diverticulosis without acute diverticulitis. 3. Stable subpleural nodules in the lung bases measuring up to 7 mm in the lateral segment of the right middle lobe. If the patient is at low risk for bronchogenic carcinoma, an additional follow-up an 6 months to 1 year could be considered. If the patient is at high risk for bronchogenic carcinoma an additional follow-up an 3 months could be considered to ensure stability. 4. Atherosclerosis with 3 vessel coronary artery disease.   Electronically Signed   By: Jeannine Boga M.D.   On: 07/11/2014 02:52   Dg Abd Portable  1v  07/12/2014   CLINICAL DATA:  Partial small bowel obstruction, followup  EXAM: PORTABLE ABDOMEN - 1 VIEW  COMPARISON:  CT abdomen pelvis of 07/11/2014  FINDINGS: No significant gaseous distention of small bowel is seen, indicating improvement in partial SBO noted on recent CT of the abdomen pelvis. A small amount of contrast is noted within the nondistended right colon. No opaque calculi are noted.  IMPRESSION: No significant small bowel distention is seen.   Electronically Signed   By: Ivar Drape M.D.   On: 07/12/2014 08:06    DISCHARGE EXAMINATION: Filed Vitals:   07/12/14 2200 07/13/14 0142 07/13/14 0553 07/13/14 0833  BP: 139/66 137/71 137/65 143/67  Pulse: 96 73 70 74  Temp: 98.6 F (37 C) 98.1 F (36.7 C) 97.9 F (36.6 C) 98.6 F (37 C)  TempSrc:      Resp: 16 14 16    Weight:      SpO2: 98% 100% 97% 98%   General appearance: alert, cooperative, appears stated age and no distress Resp: clear to auscultation bilaterally Cardio: regular rate and rhythm, S1, S2 normal, no murmur, click, rub or gallop GI: soft, non-tender; bowel sounds normal; no masses,  no organomegaly  DISPOSITION: Home  Discharge Instructions   Call MD for:  difficulty breathing, headache or visual  disturbances    Complete by:  As directed      Call MD for:  persistant dizziness or light-headedness    Complete by:  As directed      Call MD for:  persistant nausea and vomiting    Complete by:  As directed      Call MD for:  severe uncontrolled pain    Complete by:  As directed      Diet Carb Modified    Complete by:  As directed      Discharge instructions    Complete by:  As directed   Take soft diet for next 4 -5 days and then resume usual diet.     Increase activity slowly    Complete by:  As directed            ALLERGIES:  Allergies  Allergen Reactions  . Zocor [Simvastatin]     Ear ringing  . Lipitor [Atorvastatin]     tinnitus    Current Discharge Medication List    START taking  these medications   Details  docusate sodium (COLACE) 100 MG capsule Take 1 capsule (100 mg total) by mouth 2 (two) times daily. Qty: 60 capsule, Refills: 0    ondansetron (ZOFRAN ODT) 4 MG disintegrating tablet Take 1 tablet (4 mg total) by mouth every 8 (eight) hours as needed for nausea or vomiting. Qty: 20 tablet, Refills: 0      CONTINUE these medications which have NOT CHANGED   Details  aspirin 81 MG tablet Take 81 mg by mouth daily.      ibuprofen (ADVIL,MOTRIN) 200 MG tablet Take 200 mg by mouth every 6 (six) hours as needed for moderate pain.    metFORMIN (GLUCOPHAGE) 500 MG tablet Take 500 mg by mouth 2 (two) times daily with a meal.    Multiple Vitamins-Minerals (CENTRUM SILVER PO) Take 1 capsule by mouth daily.           TOTAL DISCHARGE TIME: 35 mins  [facility]  [facility] Pager [phone]  07/13/2014, 10:48 AM  Disclaimer: This note was dictated with voice recognition software. Similar sounding words can inadvertently be transcribed and may not be corrected upon review.

## 2014-07-13 NOTE — Progress Notes (Signed)
I have seen and examined the patient and agree with the assessment and plans. Will sign off  Jamen Loiseau A. Ninfa Linden  MD, FACS

## 2014-07-13 NOTE — Progress Notes (Signed)
Patient ID: [name], female   DOB: December 19, 1929, 80 y.o.   MRN: [id number]     [address] [place], [place] [address]    Phone: [phone] FAX: [phone]     Subjective: No n/v.  Having BMs.  No pain.  Tolerating solids.   Objective:  Vital signs:  Filed Vitals:   07/12/14 2200 07/13/14 0142 07/13/14 0553 07/13/14 0833  BP: 139/66 137/71 137/65 143/67  Pulse: 96 73 70 74  Temp: 98.6 F (37 C) 98.1 F (36.7 C) 97.9 F (36.6 C) 98.6 F (37 C)  TempSrc:      Resp: 16 14 16    Weight:      SpO2: 98% 100% 97% 98%    Last BM Date: 07/12/14  Intake/Output   Yesterday:  09/09 0701 - 09/10 0700 In: 480 [P.O.:480] Out: -  This shift:  Total I/O In: 240 [P.O.:240] Out: -   Physical Exam:  General: Pt awake/alert/oriented x4 in no acute distressy  Abdomen: Soft. Nondistended. Nontender.  No evidence of peritonitis. No incarcerated hernias.   Problem List:   Principal Problem:   Small bowel obstruction Active Problems:   GERD   OSTEOARTHRITIS    Results:   Labs: Results for orders placed during the hospital encounter of 07/10/14 (from the past 48 hour(s))  GLUCOSE, CAPILLARY     Status: Abnormal   Collection Time    07/11/14 10:28 AM      Result Value Ref Range   Glucose-Capillary 103 (*) 70 - 99 mg/dL  GLUCOSE, CAPILLARY     Status: None   Collection Time    07/11/14  1:21 PM      Result Value Ref Range   Glucose-Capillary 72  70 - 99 mg/dL  GLUCOSE, CAPILLARY     Status: None   Collection Time    07/11/14  4:18 PM      Result Value Ref Range   Glucose-Capillary 75  70 - 99 mg/dL  GLUCOSE, CAPILLARY     Status: None   Collection Time    07/11/14  8:54 PM      Result Value Ref Range   Glucose-Capillary 97  70 - 99 mg/dL   Comment 1 Notify RN    GLUCOSE, CAPILLARY     Status: Abnormal   Collection Time    07/12/14 12:06 AM      Result Value Ref Range   Glucose-Capillary  130 (*) 70 - 99 mg/dL  GLUCOSE, CAPILLARY     Status: Abnormal   Collection Time    07/12/14  4:01 AM      Result Value Ref Range   Glucose-Capillary 129 (*) 70 - 99 mg/dL   Comment 1 Notify RN    CBC     Status: Abnormal   Collection Time    07/12/14  4:55 AM      Result Value Ref Range   WBC 7.0  4.0 - 10.5 K/uL   RBC 4.02  3.87 - 5.11 MIL/uL   Hemoglobin 10.9 (*) 12.0 - 15.0 g/dL   HCT 33.0 (*) 36.0 - 46.0 %   MCV 82.1  78.0 - 100.0 fL   MCH 27.1  26.0 - 34.0 pg   MCHC 33.0  30.0 - 36.0 g/dL   RDW 13.8  11.5 - 15.5 %   Platelets 187  150 - 400 K/uL  BASIC METABOLIC PANEL     Status: Abnormal  Collection Time    07/12/14  4:55 AM      Result Value Ref Range   Sodium 142  137 - 147 mEq/L   Potassium 3.6 (*) 3.7 - 5.3 mEq/L   Chloride 106  96 - 112 mEq/L   CO2 24  19 - 32 mEq/L   Glucose, Bld 126 (*) 70 - 99 mg/dL   BUN 10  6 - 23 mg/dL   Creatinine, Ser 0.74  0.50 - 1.10 mg/dL   Calcium 8.2 (*) 8.4 - 10.5 mg/dL   GFR calc non Af Amer 78 (*) >90 mL/min   GFR calc Af Amer >90  >90 mL/min   Comment: (NOTE)     The eGFR has been calculated using the CKD EPI equation.     This calculation has not been validated in all clinical situations.     eGFR's persistently <90 mL/min signify possible Chronic Kidney     Disease.   Anion gap 12  5 - 15  GLUCOSE, CAPILLARY     Status: None   Collection Time    07/12/14  8:10 AM      Result Value Ref Range   Glucose-Capillary 99  70 - 99 mg/dL  GLUCOSE, CAPILLARY     Status: Abnormal   Collection Time    07/12/14 11:37 AM      Result Value Ref Range   Glucose-Capillary 137 (*) 70 - 99 mg/dL  GLUCOSE, CAPILLARY     Status: None   Collection Time    07/12/14  4:18 PM      Result Value Ref Range   Glucose-Capillary 86  70 - 99 mg/dL  GLUCOSE, CAPILLARY     Status: Abnormal   Collection Time    07/12/14  9:27 PM      Result Value Ref Range   Glucose-Capillary 117 (*) 70 - 99 mg/dL  CBC     Status: Abnormal   Collection Time     07/13/14  6:29 AM      Result Value Ref Range   WBC 6.8  4.0 - 10.5 K/uL   RBC 4.05  3.87 - 5.11 MIL/uL   Hemoglobin 11.1 (*) 12.0 - 15.0 g/dL   HCT 34.4 (*) 36.0 - 46.0 %   MCV 84.9  78.0 - 100.0 fL   MCH 27.4  26.0 - 34.0 pg   MCHC 32.3  30.0 - 36.0 g/dL   RDW 13.8  11.5 - 15.5 %   Platelets 191  150 - 400 K/uL  BASIC METABOLIC PANEL     Status: Abnormal   Collection Time    07/13/14  6:29 AM      Result Value Ref Range   Sodium 144  137 - 147 mEq/L   Potassium 4.2  3.7 - 5.3 mEq/L   Chloride 107  96 - 112 mEq/L   CO2 26  19 - 32 mEq/L   Glucose, Bld 96  70 - 99 mg/dL   BUN 7  6 - 23 mg/dL   Creatinine, Ser 0.72  0.50 - 1.10 mg/dL   Calcium 8.8  8.4 - 10.5 mg/dL   GFR calc non Af Amer 78 (*) >90 mL/min   GFR calc Af Amer >90  >90 mL/min   Comment: (NOTE)     The eGFR has been calculated using the CKD EPI equation.     This calculation has not been validated in all clinical situations.     eGFR's persistently <90 mL/min signify possible Chronic  Kidney     Disease.   Anion gap 11  5 - 15  GLUCOSE, CAPILLARY     Status: None   Collection Time    07/13/14  6:42 AM      Result Value Ref Range   Glucose-Capillary 98  70 - 99 mg/dL    Imaging / Studies: Dg Abd Portable 1v  07/12/2014   CLINICAL DATA:  Partial small bowel obstruction, followup  EXAM: PORTABLE ABDOMEN - 1 VIEW  COMPARISON:  CT abdomen pelvis of 07/11/2014  FINDINGS: No significant gaseous distention of small bowel is seen, indicating improvement in partial SBO noted on recent CT of the abdomen pelvis. A small amount of contrast is noted within the nondistended right colon. No opaque calculi are noted.  IMPRESSION: No significant small bowel distention is seen.   Electronically Signed   By: Ivar Drape M.D.   On: 07/12/2014 08:06    Medications / Allergies:  Scheduled Meds: . aspirin  81 mg Oral Daily  . heparin  5,000 Units Subcutaneous 3 times per day  . insulin aspart  0-9 Units Subcutaneous 6 times per day    Continuous Infusions: . sodium chloride 10 mL/hr at 07/12/14 0916   PRN Meds:.morphine injection, ondansetron  Antibiotics: Anti-infectives   None       Assessment/Plan  PSBO  -resolved, tolerating solids, having BMs, no abdominal pain. -stable for discharge from surgical standpoint.  Please call with questions or concerns.   Erby Pian, [facility] Pager [phone]) For consults and floor pages call [phone](7A-4:30P)  07/13/2014 9:05 AM

## 2014-07-14 ENCOUNTER — Telehealth: Payer: Self-pay

## 2014-07-14 NOTE — Telephone Encounter (Signed)
Admit date: 07/10/2014  Discharge date: 07/13/2014  Reason for admission:  Small Bowel Obstruction  RECOMMENDATIONS FOR OUTPATIENT FOLLOW UP:  1. Patient to avoid constipation.  2. Please follow up for abnormalities noted on CT including subpleural pulmonary nodules and atherosclerosis,   Transition Care Management Follow-up Telephone Call  How have you been since you were released from the hospital?  Abdominal cramping. Relieved with passing gas.  Stools have been soft and loose.  Last BM: today (07/14/14) at 2 pm).  Has had 2 stools today.  Stools are "rust" colored.  No blood.  States she's eating and tolerating fluids well.  Denies nausea and vomiting.   Do you understand why you were in the hospital? yes   Do you understand the discharge instrcutions? yes  Items Reviewed:  Medications reviewed: yes. Pt is not taking colace daily. Instead she has decided to drink prune juice and apple juice daily to prevent constipation.  She also did not pick up Zofran from pharmacy.  She said that she did not need it.    Allergies reviewed: yes  Dietary changes reviewed: yes, pt was reminded to consume a soft diet x 4 days and then resume a normal diet.  Pt stated that her diet has been soft thus far.    Referrals reviewed: n/a   Functional Questionnaire:   Activities of Daily Living (ADLs):   She states they are independent in the following: ambulation, bathing and hygiene, feeding, continence, grooming, toileting and dressing States they require assistance with the following: needs assistance with cleaning house   Any transportation issues/concerns?: yes   Any patient concerns? no   Confirmed importance and date/time of follow-up visits scheduled: yes   Confirmed with patient if condition begins to worsen call PCP or go to the ER.  Patient was given the Call-a-Nurse line [phone]: yes  Hospital Follow up appointment scheduled for 07/24/14 @ 11:30 am with Dr. Larose Kells.

## 2014-07-17 NOTE — Telephone Encounter (Signed)
Please call pt-- she no showed her f/u

## 2014-07-18 NOTE — Telephone Encounter (Signed)
Pt's hospital follow up is scheduled for 07/24/14 @ 11:30 am.

## 2014-07-18 NOTE — Telephone Encounter (Signed)
Thanks

## 2014-07-24 ENCOUNTER — Ambulatory Visit (INDEPENDENT_AMBULATORY_CARE_PROVIDER_SITE_OTHER): Payer: Medicare Other | Admitting: Internal Medicine

## 2014-07-24 ENCOUNTER — Encounter: Payer: Self-pay | Admitting: Internal Medicine

## 2014-07-24 VITALS — BP 122/62 | HR 78 | Temp 97.5°F | Wt 197.5 lb

## 2014-07-24 DIAGNOSIS — K56609 Unspecified intestinal obstruction, unspecified as to partial versus complete obstruction: Secondary | ICD-10-CM

## 2014-07-24 DIAGNOSIS — Z23 Encounter for immunization: Secondary | ICD-10-CM

## 2014-07-24 DIAGNOSIS — R918 Other nonspecific abnormal finding of lung field: Secondary | ICD-10-CM | POA: Insufficient documentation

## 2014-07-24 NOTE — Progress Notes (Signed)
Subjective:    Patient ID: [name], female    DOB: 1930-06-13, 80 y.o.   MRN: [id number]  DOS:  07/24/2014 Type of visit - description : Hospital followup, chart reviewed, excerpts:   Admit date: 07/10/2014  Discharge date: 07/13/2014       Small bowel obstruction  Patient presented with above mentioned symptoms. She was admitted. There was problem placing NG tube. But patient improved without one. She was seen by general surgery. Her diet was advanced once she started passing flatus. She has tolerated liquids. If she tolerates lunch today, she could be discharged home. She should avoid constipation. Colace recommended.  History of Diabetes type 2  Recent A1c was 6.7. Patient is on metformin at home which she can resume.  GERD:  No acid reflux symptoms at this moment.  Osteoarthritis:  Stable.  Stable subpleural nodules in the lung bases:  Measuring up to 7 mm in the lateral segment of the right middle lobe. Follow up as outpatient  Atherosclerosis noted on CT  OP follow up for same.   Hypokalemia  Repleted orally.   ROS Since she left the hospital, she is feeling better. Appetite is normal No nausea vomiting. Having 2 bowel movements a day, is using prune juice to help her BMs, decided not to take any OTCs for constipation. Blood sugars are within normal.   Past Medical History  Diagnosis Date  . Osteopenia   . Abnormal breast exam 2004    MMG neg (eval by surgeon neg)  . Hx SBO 2004 , 2007, 2012     exploratory surgery 9-12   . Iritis 2005  . Type II diabetes mellitus   . Anemia   . History of blood transfusion     "related to a surgery, I think"  . Osteoarthritis   . Arthritis     "knees" (07/11/2014)  . SBO (small bowel obstruction) 07-2014    Past Surgical History  Procedure Laterality Date  . Total knee arthroplasty Right 10/2010  . Exploratory laparotomy  07/2011    SBO  . Appendectomy    . Joint replacement    . Total abdominal hysterectomy   1970's    for bleeding, no cancer  . Dilation and curettage of uterus      History   Social History  . Marital Status: Widowed    Spouse Name: N/A    Number of Children: 2  . Years of Education: N/A   Occupational History  . Has her own business     makes aprons    Social History Main Topics  . Smoking status: Never Smoker   . Smokeless tobacco: Never Used  . Alcohol Use: No  . Drug Use: No  . Sexual Activity: No   Other Topics Concern  . Not on file   Social History Narrative   Lives by herself, her family is in [place], lost 2 sisters                  Medication List       This list is accurate as of: 07/24/14 11:59 PM.  Always use your most recent med list.               aspirin 81 MG tablet  Take 81 mg by mouth daily.     CENTRUM SILVER PO  Take 1 capsule by mouth daily.     metFORMIN 500 MG tablet  Commonly known as:  GLUCOPHAGE  Take 500 mg  by mouth 2 (two) times daily with a meal.           Objective:   Physical Exam BP 122/62  Pulse 78  Temp(Src) 97.5 F (36.4 C) (Oral)  Wt 197 lb 8 oz (89.585 kg)  SpO2 97%  General -- alert, well-developed, NAD.  Lungs -- normal respiratory effort, no intercostal retractions, no accessory muscle use, and normal breath sounds.  Heart-- normal rate, regular rhythm, no murmur.  Abdomen-- Not distended, good bowel sounds,soft, non-tender. No rebound or rigidity. No mass,organomegaly. Extremities-- no pretibial edema bilaterally  Neurologic--  alert & oriented X3. Speech normal, gait appropriate for age, strength symmetric and appropriate for age.   Psych-- Cognition and judgment appear intact. Cooperative with normal attention span and concentration. No anxious or depressed appearing.     Assessment & Plan:

## 2014-07-24 NOTE — Assessment & Plan Note (Signed)
History of lung nodules, see report from the CT few days ago. She has known nodules since at least 2009 when she had a CT chest. Plan: Observation for now  CT few days ago: 3. Stable subpleural nodules in the lung bases measuring up to 7 mm  in the lateral segment of the right middle lobe. If the patient is  at low risk for bronchogenic carcinoma, an additional follow-up an 6  months to 1 year could be considered. If the patient is at high risk  for bronchogenic carcinoma an additional follow-up an 3 months could  be considered to ensure stability.

## 2014-07-24 NOTE — Patient Instructions (Signed)
Please come back to the office by December 2015 for a routine office check, no  fasting    Stop by the front desk and schedule the visit

## 2014-07-24 NOTE — Assessment & Plan Note (Signed)
Recently admitted to the hospital with SBO that quickly resolved. This is not the first time that happened, she was recommended no outpatient followup with surgery. Plan: No further action needed except to continue taking prune juice to prevent constipation

## 2014-07-24 NOTE — Progress Notes (Signed)
Pre visit review using our clinic review tool, if applicable. No additional management support is needed unless otherwise documented below in the visit note. 

## 2014-07-31 ENCOUNTER — Telehealth: Payer: Self-pay | Admitting: Internal Medicine

## 2014-07-31 NOTE — Telephone Encounter (Signed)
Advise patient, at this point a chest x-ray is not necessary, we can discuss on return to the office

## 2014-07-31 NOTE — Telephone Encounter (Signed)
Please advise 

## 2014-07-31 NOTE — Telephone Encounter (Signed)
Explained patient that these abnormalities has been there for years and are stable. She understands and is agreeable with observation

## 2014-07-31 NOTE — Telephone Encounter (Signed)
Caller name: Meiya Relation to pt: self Call back number: [phone] Pharmacy:  Reason for call:   Patient states that she has changed her mind and does not want to wait until December to have x-ray

## 2014-07-31 NOTE — Telephone Encounter (Signed)
Pt is very demanding on the xray. She stated that she would like to know what you saw? She stated that she can't stop worrying about it and stated that she would like to talk to you about this.

## 2014-09-18 ENCOUNTER — Encounter: Payer: Self-pay | Admitting: Internal Medicine

## 2014-09-18 ENCOUNTER — Ambulatory Visit (INDEPENDENT_AMBULATORY_CARE_PROVIDER_SITE_OTHER): Payer: Medicare Other | Admitting: Internal Medicine

## 2014-09-18 VITALS — BP 151/76 | HR 80 | Temp 98.0°F | Wt 200.4 lb

## 2014-09-18 DIAGNOSIS — J0100 Acute maxillary sinusitis, unspecified: Secondary | ICD-10-CM

## 2014-09-18 MED ORDER — AZELASTINE HCL 0.1 % NA SOLN: 2.0000 | Freq: Two times a day (BID) | NASAL | Status: DC

## 2014-09-18 MED ORDER — AMOXICILLIN 500 MG PO CAPS: 1000.0000 mg | ORAL_CAPSULE | Freq: Two times a day (BID) | ORAL | Status: DC

## 2014-09-18 NOTE — Patient Instructions (Signed)
Rest, fluids , tylenol If  cough, take Mucinex DM twice a day as needed  If nasal  congestion use ASTELIN as you are doing Take the antibiotic as prescribed  (Amoxicillin) Call if not gradually better over the next  10 days Call anytime if the symptoms are severe

## 2014-09-18 NOTE — Progress Notes (Signed)
Pre visit review using our clinic review tool, if applicable. No additional management support is needed unless otherwise documented below in the visit note. 

## 2014-09-18 NOTE — Progress Notes (Signed)
Subjective:    Patient ID: [name], female    DOB: 02/28/1930, 80 y.o.   MRN: [id number]  DOS:  09/18/2014 Type of visit - description : acute Interval history: Symptoms started a week ago with left-sided facial congestion, nose congestion, headache. She is taking Tylenol and Astelin with  mild relief. She  Mouth-breathe due to nasal congestion  ROS Denies fever or chills per se, no sore throat, not able to blow anything out of her nose. No nausea vomiting No cough or bronchial congestion  Past Medical History  Diagnosis Date  . Osteopenia   . Abnormal breast exam 2004    MMG neg (eval by surgeon neg)  . Hx SBO 2004 , 2007, 2012     exploratory surgery 9-12   . Iritis 2005  . Type II diabetes mellitus   . Anemia   . History of blood transfusion     "related to a surgery, I think"  . Osteoarthritis   . Arthritis     "knees" (07/11/2014)  . SBO (small bowel obstruction) 07-2014    Past Surgical History  Procedure Laterality Date  . Total knee arthroplasty Right 10/2010  . Exploratory laparotomy  07/2011    SBO  . Appendectomy    . Joint replacement    . Total abdominal hysterectomy  1970's    for bleeding, no cancer  . Dilation and curettage of uterus      History   Social History  . Marital Status: Widowed    Spouse Name: N/A    Number of Children: 2  . Years of Education: N/A   Occupational History  . Has her own business     makes aprons    Social History Main Topics  . Smoking status: Never Smoker   . Smokeless tobacco: Never Used  . Alcohol Use: No  . Drug Use: No  . Sexual Activity: No   Other Topics Concern  . Not on file   Social History Narrative   Lives by herself, her family is in [place], lost 2 sisters                  Medication List       This list is accurate as of: 09/18/14  5:40 PM.  Always use your most recent med list.               amoxicillin 500 MG capsule  Commonly known as:  AMOXIL  Take 2 capsules  (1,000 mg total) by mouth 2 (two) times daily.     aspirin 81 MG tablet  Take 81 mg by mouth daily.     azelastine 0.1 % nasal spray  Commonly known as:  ASTELIN  Place 2 sprays into both nostrils 2 (two) times daily.     CENTRUM SILVER PO  Take 1 capsule by mouth daily.     metFORMIN 500 MG tablet  Commonly known as:  GLUCOPHAGE  Take 500 mg by mouth 2 (two) times daily with a meal.           Objective:   Physical Exam BP 151/76 mmHg  Pulse 80  Temp(Src) 98 F (36.7 C) (Oral)  Wt 200 lb 6 oz (90.89 kg)  SpO2 99%  General -- alert, well-developed, NAD.   HEENT-- Not pale.  R Ear-- normal L ear-- normal Throat symmetric, no redness or discharge. Face symmetric, sinuses : L maxilary is TTP, nose   congested. Lungs -- normal respiratory effort, no  intercostal retractions, no accessory muscle use, and normal breath sounds.  Heart-- normal rate, regular rhythm, no murmur. Neurologic--  alert & oriented X3. Speech normal, gait appropriate for age, strength symmetric and appropriate for age.  Psych-- Cognition and judgment appear intact. Cooperative with normal attention span and concentration. No anxious or depressed appearing.     Assessment & Plan:   Sinusitis, Left sided facial pain likely from sinusitis, does not have a history of migraine headaches. Plan: see instructions

## 2014-10-23 ENCOUNTER — Encounter: Payer: Self-pay | Admitting: Internal Medicine

## 2014-10-23 ENCOUNTER — Ambulatory Visit (INDEPENDENT_AMBULATORY_CARE_PROVIDER_SITE_OTHER): Payer: Medicare Other | Admitting: Internal Medicine

## 2014-10-23 ENCOUNTER — Telehealth: Payer: Self-pay

## 2014-10-23 VITALS — BP 126/72 | HR 75 | Temp 97.6°F | Wt 199.1 lb

## 2014-10-23 DIAGNOSIS — G500 Trigeminal neuralgia: Secondary | ICD-10-CM | POA: Insufficient documentation

## 2014-10-23 DIAGNOSIS — G518 Other disorders of facial nerve: Secondary | ICD-10-CM

## 2014-10-23 LAB — CBC WITH DIFFERENTIAL/PLATELET
BASOS PCT: 0.6 % (ref 0.0–3.0)
Basophils Absolute: 0 10*3/uL (ref 0.0–0.1)
Eosinophils Absolute: 0.1 10*3/uL (ref 0.0–0.7)
Eosinophils Relative: 1.5 % (ref 0.0–5.0)
HEMATOCRIT: 38.1 % (ref 36.0–46.0)
HEMOGLOBIN: 11.9 g/dL — AB (ref 12.0–15.0)
LYMPHS PCT: 53.3 % — AB (ref 12.0–46.0)
Lymphs Abs: 4.5 10*3/uL — ABNORMAL HIGH (ref 0.7–4.0)
MCHC: 31.4 g/dL (ref 30.0–36.0)
MCV: 85.2 fl (ref 78.0–100.0)
MONOS PCT: 6.9 % (ref 3.0–12.0)
Monocytes Absolute: 0.6 10*3/uL (ref 0.1–1.0)
NEUTROS ABS: 3.2 10*3/uL (ref 1.4–7.7)
Neutrophils Relative %: 37.7 % — ABNORMAL LOW (ref 43.0–77.0)
Platelets: 224 10*3/uL (ref 150.0–400.0)
RBC: 4.47 Mil/uL (ref 3.87–5.11)
RDW: 14.4 % (ref 11.5–15.5)
WBC: 8.4 10*3/uL (ref 4.0–10.5)

## 2014-10-23 LAB — SEDIMENTATION RATE: Sed Rate: 28 mm/hr — ABNORMAL HIGH (ref 0–22)

## 2014-10-23 LAB — C-REACTIVE PROTEIN: CRP: 0.5 mg/dL (ref 0.5–20.0)

## 2014-10-23 NOTE — Assessment & Plan Note (Signed)
Left facial pain, this is going on for approximately 6 weeks, was treated for sinusitis with partial-temporary improvment DDX TMJ, partially treated sinusitis, migraines, and less likely  glaucoma or TA Plan: CT sinus Will ask her eye doctor Dr. Caryl Pina to see her this week See the dentist Sedimentation rate, CRP, CBC.

## 2014-10-23 NOTE — Progress Notes (Signed)
Pre visit review using our clinic review tool, if applicable. No additional management support is needed unless otherwise documented below in the visit note. 

## 2014-10-23 NOTE — Telephone Encounter (Signed)
Pt came into office for F/U on sinusitis from 11/16 OV, Pt states she is still having left sided facial and eye pain, and blurred vision. Dr. Larose Kells recommended visit to Dr. Katy Fitch, Pts ophthalmologist. Hulen Skains and spoke with Radonna Ricker at Dr. Patrici Ranks office informed her of symptoms. Pt has appt scheduled with Dr. Katy Fitch tomorrow (12/22) at 0930. OV notes faxed to Dr. Patrici Ranks office as requested. Pt informed of appt and states she will be there.

## 2014-10-23 NOTE — Patient Instructions (Signed)
Get your blood work before you leave   Continue taking Tylenol for pain  Will ask your ophthalmologist to see you this week  Please come back to the office 3 weeks for a routine check up

## 2014-10-23 NOTE — Progress Notes (Signed)
Subjective:    Patient ID: [name], female    DOB: May 21, 1930, 80 y.o.   MRN: [id number]  DOS:  10/23/2014 Type of visit - description : rov Interval history: Left facial pain, status post antibiotics for presumed sinusitis, symptoms temporarily decreased but they are back. They are less at night and worse first thing in the morning. She does not have any nose congestion, uses Astelin nasal spray. Tylenol does help the pain. When asked, admits that the left vision is a slightly blurred sometimes and she has mild eye discomfort.   Diabetes, good compliance with metformin  ROS Denies headaches per se. No weight loss. No fever, chills, runny nose or sore throat. No myalgias per se, no rash anywhere.  Past Medical History  Diagnosis Date  . Osteopenia   . Abnormal breast exam 2004    MMG neg (eval by surgeon neg)  . Hx SBO 2004 , 2007, 2012     exploratory surgery 9-12   . Iritis 2005  . Type II diabetes mellitus   . Anemia   . History of blood transfusion     "related to a surgery, I think"  . Osteoarthritis   . Arthritis     "knees" (07/11/2014)  . SBO (small bowel obstruction) 07-2014    Past Surgical History  Procedure Laterality Date  . Total knee arthroplasty Right 10/2010  . Exploratory laparotomy  07/2011    SBO  . Appendectomy    . Joint replacement    . Total abdominal hysterectomy  1970's    for bleeding, no cancer  . Dilation and curettage of uterus      History   Social History  . Marital Status: Widowed    Spouse Name: N/A    Number of Children: 2  . Years of Education: N/A   Occupational History  . Has her own business     makes aprons    Social History Main Topics  . Smoking status: Never Smoker   . Smokeless tobacco: Never Used  . Alcohol Use: No  . Drug Use: No  . Sexual Activity: No   Other Topics Concern  . Not on file   Social History Narrative   Lives by herself, her family is in [place], lost 2 sisters                  Medication List       This list is accurate as of: 10/23/14  6:31 PM.  Always use your most recent med list.               aspirin 81 MG tablet  Take 81 mg by mouth daily.     azelastine 0.1 % nasal spray  Commonly known as:  ASTELIN  Place 2 sprays into both nostrils 2 (two) times daily.     CENTRUM SILVER PO  Take 1 capsule by mouth daily.     metFORMIN 500 MG tablet  Commonly known as:  GLUCOPHAGE  Take 500 mg by mouth 2 (two) times daily with a meal.           Objective:   Physical Exam BP 126/72 mmHg  Pulse 75  Temp(Src) 97.6 F (36.4 C) (Oral)  Wt 199 lb 2 oz (90.323 kg)  SpO2 100% General -- alert, well-developed, NAD.  HEENT-- Not pale.  R Ear-- normal L ear-- normal Throat symmetric, no redness or discharge. Face symmetric, sinuses not tender to palpation. Nose slt  Congested. TMJs--  no clear and is slightly tender to palpation on the left Eyes-- no redness or discharge, anterior chambers normal, EOMI. Pupils small but equal and reactive Lungs -- normal respiratory effort, no intercostal retractions, no accessory muscle use, and normal breath sounds.  Heart-- normal rate, regular rhythm, no murmur.   Neurologic--  alert & oriented X3. Speech normal, gait appropriate for age, strength symmetric and appropriate for age.  Psych-- Cognition and judgment appear intact. Cooperative with normal attention span and concentration. No anxious or depressed appearing.      Assessment & Plan:

## 2014-10-31 ENCOUNTER — Other Ambulatory Visit (HOSPITAL_BASED_OUTPATIENT_CLINIC_OR_DEPARTMENT_OTHER): Payer: Medicare Other

## 2014-11-01 ENCOUNTER — Ambulatory Visit (HOSPITAL_BASED_OUTPATIENT_CLINIC_OR_DEPARTMENT_OTHER)
Admission: RE | Admit: 2014-11-01 | Discharge: 2014-11-01 | Disposition: A | Discharge status: Home / Self Care | Payer: Medicare Other | Source: Ambulatory Visit | Attending: Internal Medicine | Admitting: Internal Medicine

## 2014-11-01 DIAGNOSIS — M4682 Other specified inflammatory spondylopathies, cervical region: Secondary | ICD-10-CM | POA: Diagnosis not present

## 2014-11-01 DIAGNOSIS — R51 Headache: Secondary | ICD-10-CM | POA: Diagnosis present

## 2014-11-01 DIAGNOSIS — G500 Trigeminal neuralgia: Secondary | ICD-10-CM

## 2014-11-09 ENCOUNTER — Ambulatory Visit: Payer: Medicare Other | Admitting: Internal Medicine

## 2014-11-13 ENCOUNTER — Ambulatory Visit: Payer: Self-pay | Admitting: Internal Medicine

## 2014-11-17 ENCOUNTER — Ambulatory Visit (INDEPENDENT_AMBULATORY_CARE_PROVIDER_SITE_OTHER): Payer: Medicare Other | Admitting: Internal Medicine

## 2014-11-17 ENCOUNTER — Encounter: Payer: Self-pay | Admitting: Internal Medicine

## 2014-11-17 VITALS — BP 112/66 | HR 79 | Temp 98.0°F | Ht 66.0 in | Wt 201.1 lb

## 2014-11-17 DIAGNOSIS — E119 Type 2 diabetes mellitus without complications: Secondary | ICD-10-CM

## 2014-11-17 DIAGNOSIS — G518 Other disorders of facial nerve: Secondary | ICD-10-CM | POA: Diagnosis not present

## 2014-11-17 DIAGNOSIS — G500 Trigeminal neuralgia: Secondary | ICD-10-CM

## 2014-11-17 LAB — HEMOGLOBIN A1C: Hgb A1c MFr Bld: 6.8 % — ABNORMAL HIGH (ref 4.6–6.5)

## 2014-11-17 NOTE — Patient Instructions (Signed)
Get your blood work before you leave    Please come back to the office in 6 months  for a physical exam. Come back fasting

## 2014-11-17 NOTE — Assessment & Plan Note (Signed)
CT showed no problems with the sinuses but possibly a dental infection. Facial pain is better, eye exam was negative, patient will see her dentist soon.

## 2014-11-17 NOTE — Assessment & Plan Note (Addendum)
Continue metformin, check A1c,doing well with lifestyle

## 2014-11-17 NOTE — Progress Notes (Signed)
   Subjective:    Patient ID: [name], female    DOB: [birth year], 81 y.o.   MRN: [id number]  DOS:  11/17/2014 Type of visit - description : rov Interval history: Was recently seen with facial pain, went to see the eye doctor, everything was okay after a thorough examination. At this point the pain has definitely decreased, taking Tylenol as needed Good compliance with metformin, not ambulatory CBGs  ROS  Denies nausea, vomiting, diarrhea No chest pain or difficulty breathing Past Medical History  Diagnosis Date  . Osteopenia   . Abnormal breast exam 2004    MMG neg (eval by surgeon neg)  . Hx SBO 2004 , 2007, 2012     exploratory surgery 9-12   . Iritis 2005  . Type II diabetes mellitus   . Anemia   . History of blood transfusion     "related to a surgery, I think"  . Osteoarthritis   . Arthritis     "knees" (07/11/2014)  . SBO (small bowel obstruction) 07-2014    Past Surgical History  Procedure Laterality Date  . Total knee arthroplasty Right 10/2010  . Exploratory laparotomy  07/2011    SBO  . Appendectomy    . Joint replacement    . Total abdominal hysterectomy  1970's    for bleeding, no cancer  . Dilation and curettage of uterus      History   Social History  . Marital Status: Widowed    Spouse Name: N/A    Number of Children: 2  . Years of Education: N/A   Occupational History  . Has her own business     makes aprons    Social History Main Topics  . Smoking status: Never Smoker   . Smokeless tobacco: Never Used  . Alcohol Use: No  . Drug Use: No  . Sexual Activity: No   Other Topics Concern  . Not on file   Social History Narrative   Lives by herself, her family is in [place], lost 2 sisters                  Medication List       This list is accurate as of: 11/17/14 11:59 PM.  Always use your most recent med list.               aspirin 81 MG tablet  Take 81 mg by mouth daily.     CENTRUM SILVER PO  Take 1 capsule by  mouth daily.     metFORMIN 500 MG tablet  Commonly known as:  GLUCOPHAGE  Take 500 mg by mouth 2 (two) times daily with a meal.           Objective:   Physical Exam BP 112/66 mmHg  Pulse 79  Temp(Src) 98 F (36.7 C) (Oral)  Ht 5\' 6"  (1.676 m)  Wt 201 lb 2 oz (91.23 kg)  BMI 32.48 kg/m2  SpO2 95% General -- alert, well-developed, NAD.   Lungs -- normal respiratory effort, no intercostal retractions, no accessory muscle use, and normal breath sounds.  Heart-- normal rate, regular rhythm, no murmur.  Extremities-- no pretibial edema bilaterally  Neurologic--  alert & oriented X3. Speech normal, gait appropriate for age, strength symmetric and appropriate for age.  Psych-- Cognition and judgment appear intact. Cooperative with normal attention span and concentration. No anxious or depressed appearing.       Assessment & Plan:

## 2014-11-17 NOTE — Progress Notes (Signed)
Pre visit review using our clinic review tool, if applicable. No additional management support is needed unless otherwise documented below in the visit note. 

## 2015-01-30 ENCOUNTER — Other Ambulatory Visit: Payer: Self-pay | Admitting: Internal Medicine

## 2015-04-23 ENCOUNTER — Encounter: Payer: Self-pay | Admitting: Internal Medicine

## 2015-04-23 ENCOUNTER — Other Ambulatory Visit: Payer: Self-pay

## 2015-04-23 ENCOUNTER — Ambulatory Visit (INDEPENDENT_AMBULATORY_CARE_PROVIDER_SITE_OTHER): Payer: Medicare Other | Admitting: Internal Medicine

## 2015-04-23 VITALS — BP 128/78 | HR 71 | Temp 98.1°F | Ht 66.0 in | Wt 197.2 lb

## 2015-04-23 DIAGNOSIS — M159 Polyosteoarthritis, unspecified: Secondary | ICD-10-CM

## 2015-04-23 DIAGNOSIS — R42 Dizziness and giddiness: Secondary | ICD-10-CM

## 2015-04-23 DIAGNOSIS — H9319 Tinnitus, unspecified ear: Secondary | ICD-10-CM | POA: Diagnosis not present

## 2015-04-23 DIAGNOSIS — M150 Primary generalized (osteo)arthritis: Secondary | ICD-10-CM

## 2015-04-23 NOTE — Patient Instructions (Signed)
Please schedule a physical exam within a month, fasting

## 2015-04-23 NOTE — Progress Notes (Signed)
Subjective:    Patient ID: [name], female    DOB: May 19, 1930, 81 y.o.   MRN: [id number]  DOS:  04/23/2015 Type of visit - description : acute visit Interval history:  Patient is a 81 year old female with a history of diabetes, osteoarthritis, and anemia in today for an acute problem  Dizziness: Patient has been having a recent history of dizziness that initially only occurred while standing up quickly, but has progressed to the point of occurring "out of the blue" while she is standing. It is accompanied by a feeling of lightheadedness and blurry vision and has increased in frequency to the point of occurring every day. States that she feels unsteady on her feet, denies any feelings of vertigo. Patient denies stroke-like symptoms.  Tinnitus: Patient has a long history of tinnitus in her left ear. She has not tried any medications to alleviate her symptom. Denies any hearing loss in either ear.   Shoulder: Patient notes history of shoulder pain, but is not sure how long she has been having this pain. She notes that the pain is more of a tingling sensation in her shoulder with pain occurring with certain movements. Denies radiation of pain down her arm. No numbness.    Review of Systems  Constitutional: No fever. No chills.  HEENT: Tinnitus per HPI   Respiratory: No wheezing, no difficulty breathing.  Cardiovascular: No CP, no leg swelling  GI: no nausea, no vomiting, no diarrhea , no  abdominal pain.  No blood in the stools.   GU: No dysuria, gross hematuria, difficulty urinating. No urinary urgency, no frequency.  Musculoskeletal: Shoulder pain per HPI.  Neurological: Dizziness per HPI. No diplopia. No slurred speech, no motor deficits, no facial numbness   Past Medical History  Diagnosis Date  . Osteopenia   . Abnormal breast exam 2004    MMG neg (eval by surgeon neg)  . Hx SBO 2004 , 2007, 2012     exploratory surgery 9-12   . Iritis 2005  . Type II diabetes  mellitus   . Anemia   . History of blood transfusion     "related to a surgery, I think"  . Osteoarthritis   . Arthritis     "knees" (07/11/2014)  . SBO (small bowel obstruction) 07-2014    Past Surgical History  Procedure Laterality Date  . Total knee arthroplasty Right 10/2010  . Exploratory laparotomy  07/2011    SBO  . Appendectomy    . Joint replacement    . Total abdominal hysterectomy  1970's    for bleeding, no cancer  . Dilation and curettage of uterus      History   Social History  . Marital Status: Widowed    Spouse Name: N/A  . Number of Children: 2  . Years of Education: N/A   Occupational History  . Has her own business     makes aprons    Social History Main Topics  . Smoking status: Never Smoker   . Smokeless tobacco: Never Used  . Alcohol Use: No  . Drug Use: No  . Sexual Activity: No   Other Topics Concern  . Not on file   Social History Narrative   Lives by herself, her family is in [place], lost 2 sisters               Family History  Problem Relation Age of Onset  . Diabetes Father   . Heart attack Neg Hx   .  Breast cancer Sister   . Sudden death Sister   . Colon cancer Neg Hx   . Breast cancer Sister        Medication List       This list is accurate as of: 04/23/15 11:59 PM.  Always use your most recent med list.               aspirin 81 MG tablet  Take 81 mg by mouth daily.     CENTRUM SILVER PO  Take 1 capsule by mouth daily.     metFORMIN 500 MG tablet  Commonly known as:  GLUCOPHAGE  TAKE 1 TABLET TWICE A DAY WITH A MEAL           Objective:   Physical Exam BP 128/78 mmHg  Pulse 71  Temp(Src) 98.1 F (36.7 C) (Oral)  Ht 5\' 6"  (1.676 m)  Wt 197 lb 4 oz (89.472 kg)  BMI 31.85 kg/m2  SpO2 96%  General:   Well developed, well nourished . NAD.  HEENT:  Normocephalic . Face symmetric, atraumatic TMs clear bilaterally EOMI pupils symmetric, responsiveness to light difficult to assess given lack of  dark area. Lungs:  CTA B Normal respiratory effort, no intercostal retractions, no accessory muscle use. Heart: RRR,  no murmur.  No pretibial edema bilaterally  Skin: Not pale. Not jaundice MSK: Empty can test - positive Shoulder tender to palpation over bicipital groove. Neurologic:  alert & oriented X3.  Speech normal, gait appropriate for age and unassisted Strength symmetric and appropriate for age. Romberg test present (?) Psych--  Cognition and judgment appear intact.  Cooperative with normal attention span and concentration.  Behavior appropriate. No anxious or depressed appearing.     Assessment & Plan:   (Patient seen along with   Aletta Edouard, medical student)

## 2015-04-23 NOTE — Progress Notes (Signed)
Pre visit review using our clinic review tool, if applicable. No additional management support is needed unless otherwise documented below in the visit note. 

## 2015-04-24 DIAGNOSIS — R42 Dizziness and giddiness: Secondary | ICD-10-CM | POA: Insufficient documentation

## 2015-04-24 NOTE — Assessment & Plan Note (Signed)
Shoulder pain:  Patient has been having tingling sensation in right shoulder which is intermittently painful. Empty can test elicited pain over supraspinatus.  Patient TTP over bicipital groove. Will refer patient to Sports Medicine for further workup.

## 2015-04-24 NOTE — Assessment & Plan Note (Signed)
Patient has long-standing history of tinnitus in left ear not accompanied by any hearing loss.  Lack of hearing loss and clear TMs bilaterally decreases level of concern, will observe the condition.  Patient instructed to let us know if symptom worsens.

## 2015-04-24 NOTE — Assessment & Plan Note (Addendum)
Dizziness: Patient has recent history of dizziness/lightheadedness while standing up from sitting which recently has become more frequent, occurring even when she is just standing still. States this is accompanied by blurry vision.  She also notes she is unsteady while walking.  Romberg possibly present  today. Remainder of neuro exam unremarkable. Plan: Given general unsteadiness and ? Of  romberg exam, will refer patient to neurology.

## 2015-04-25 ENCOUNTER — Ambulatory Visit (INDEPENDENT_AMBULATORY_CARE_PROVIDER_SITE_OTHER): Payer: Medicare Other | Admitting: Family Medicine

## 2015-04-25 ENCOUNTER — Encounter: Payer: Self-pay | Admitting: Family Medicine

## 2015-04-25 VITALS — BP 116/76 | HR 76 | Ht 65.0 in | Wt 196.0 lb

## 2015-04-25 DIAGNOSIS — M25511 Pain in right shoulder: Secondary | ICD-10-CM | POA: Diagnosis not present

## 2015-04-25 MED ORDER — DICLOFENAC SODIUM 75 MG PO TBEC: 75.0000 mg | DELAYED_RELEASE_TABLET | Freq: Two times a day (BID) | ORAL | Status: DC

## 2015-04-25 NOTE — Patient Instructions (Signed)
You have rotator cuff impingement Try to avoid painful activities (overhead activities, lifting with extended arm) as much as possible. Diclofenac twice a day with food for pain and inflammation - take 7-10 days then as needed - stop if it upsets your stomach. Can take tylenol in addition to this. Subacromial injection may be beneficial to help with pain and to decrease inflammation. Consider physical therapy with transition to home exercise program. Do home exercise program with theraband and scapular stabilization exercises daily - these are very important for long term relief even if an injection was given. 3 sets of 10 once a day. If not improving at follow-up we will consider further imaging, injection, physical therapy, and/or nitro patches.  These are the generic arthritis instructions: Take tylenol 500mg  1-2 tabs three times a day for pain. Diclofenac as noted above. Glucosamine sulfate 750mg  twice a day is a supplement that may help. Capsaicin, aspercreme, or biofreeze topically up to four times a day may also help with pain. Cortisone injections are an option. If cortisone injections do not help, there are different types of shots that may help but they take longer to take effect. It's important that you continue to stay active. Straight leg raises, knee extensions 3 sets of 10 once a day (add ankle weight if these become too easy). Consider physical therapy to strengthen muscles around the joint that hurts to take pressure off of the joint itself. Shoe inserts with good arch support may be helpful. Walker or cane if needed. Heat or ice 15 minutes at a time 3-4 times a day as needed to help with pain. Water aerobics and cycling with low resistance are the best two types of exercise for arthritis. Follow up with me in 5-6 weeks for reevaluation.

## 2015-04-26 NOTE — Progress Notes (Signed)
PCP and referred by: Kathlene November, MD  Subjective:   HPI: Patient is a 81 y.o. female here for right shoulder pain.  Patient reports she's had right shoulder pain for at least several months. No known injury. Feels like this is pulsating all day. Tried tylenol 650, aspercreme. Never had PT or done injections. Is aware that she has arthritis. Worse lying on this side and reaching overhead. Right handed.  Past Medical History  Diagnosis Date  . Osteopenia   . Abnormal breast exam 2004    MMG neg (eval by surgeon neg)  . Hx SBO 2004 , 2007, 2012     exploratory surgery 9-12   . Iritis 2005  . Type II diabetes mellitus   . Anemia   . History of blood transfusion     "related to a surgery, I think"  . Osteoarthritis   . Arthritis     "knees" (07/11/2014)  . SBO (small bowel obstruction) 07-2014    Current Outpatient Prescriptions on File Prior to Visit  Medication Sig Dispense Refill  . aspirin 81 MG tablet Take 81 mg by mouth daily.      . metFORMIN (GLUCOPHAGE) 500 MG tablet TAKE 1 TABLET TWICE A DAY WITH A MEAL 180 tablet 1  . Multiple Vitamins-Minerals (CENTRUM SILVER PO) Take 1 capsule by mouth daily.       No current facility-administered medications on file prior to visit.    Past Surgical History  Procedure Laterality Date  . Total knee arthroplasty Right 10/2010  . Exploratory laparotomy  07/2011    SBO  . Appendectomy    . Joint replacement    . Total abdominal hysterectomy  1970's    for bleeding, no cancer  . Dilation and curettage of uterus      Allergies  Allergen Reactions  . Zocor [Simvastatin]     Ear ringing  . Lipitor [Atorvastatin]     tinnitus    History   Social History  . Marital Status: Widowed    Spouse Name: N/A  . Number of Children: 2  . Years of Education: N/A   Occupational History  . Has her own business     makes aprons    Social History Main Topics  . Smoking status: Never Smoker   . Smokeless tobacco: Never Used  .  Alcohol Use: No  . Drug Use: No  . Sexual Activity: No   Other Topics Concern  . Not on file   Social History Narrative   Lives by herself, her family is in [place], lost 2 sisters              Family History  Problem Relation Age of Onset  . Diabetes Father   . Heart attack Neg Hx   . Breast cancer Sister   . Sudden death Sister   . Colon cancer Neg Hx   . Breast cancer Sister     BP 116/76 mmHg  Pulse 76  Ht 5\' 5"  (1.651 m)  Wt 196 lb (88.905 kg)  BMI 32.62 kg/m2  Review of Systems: See HPI above.    Objective:  Physical Exam:  Gen: NAD  Right shoulder: No swelling, ecchymoses.  No gross deformity. Mild TTP AC joint, biceps tendon areas FROM with painful arc. Mild positive hawkins, Neers. Negative Speeds, Yergasons. Strength 5/5 with resisted internal/external rotation.  Pain empty can, 4/5 strength. Negative apprehension. NV intact distally.    Assessment & Plan:  1. Right shoulder pain - 2/2  rotator cuff impingement.  Discussed options - she would like to start with diclofenac and home exercises which were reviewed today.  Consider injection, nitro patches, PT, imaging if not improving over next month to 6 weeks.  Also given general arthritis instructions as she's had problems with this in back.

## 2015-04-26 NOTE — Assessment & Plan Note (Signed)
2/2 rotator cuff impingement.  Discussed options - she would like to start with diclofenac and home exercises which were reviewed today.  Consider injection, nitro patches, PT, imaging if not improving over next month to 6 weeks.  Also given general arthritis instructions as she's had problems with this in back.

## 2015-05-08 ENCOUNTER — Telehealth: Payer: Self-pay | Admitting: Internal Medicine

## 2015-05-08 NOTE — Telephone Encounter (Signed)
pre visit letter mailed 05/01/15

## 2015-05-21 ENCOUNTER — Telehealth: Payer: Self-pay | Admitting: *Deleted

## 2015-05-21 ENCOUNTER — Encounter: Payer: Self-pay | Admitting: *Deleted

## 2015-05-21 NOTE — Telephone Encounter (Signed)
Pre-Visit Call completed with patient and chart updated.   Pre-Visit Info documented in Specialty Comments under [employer].    

## 2015-05-22 ENCOUNTER — Ambulatory Visit (INDEPENDENT_AMBULATORY_CARE_PROVIDER_SITE_OTHER): Payer: Medicare Other | Admitting: Internal Medicine

## 2015-05-22 ENCOUNTER — Encounter: Payer: Self-pay | Admitting: Internal Medicine

## 2015-05-22 VITALS — BP 118/66 | HR 77 | Temp 97.6°F | Ht 65.0 in | Wt 195.4 lb

## 2015-05-22 DIAGNOSIS — E119 Type 2 diabetes mellitus without complications: Secondary | ICD-10-CM | POA: Diagnosis not present

## 2015-05-22 DIAGNOSIS — E785 Hyperlipidemia, unspecified: Secondary | ICD-10-CM

## 2015-05-22 DIAGNOSIS — Z0000 Encounter for general adult medical examination without abnormal findings: Secondary | ICD-10-CM | POA: Diagnosis not present

## 2015-05-22 DIAGNOSIS — M150 Primary generalized (osteo)arthritis: Secondary | ICD-10-CM

## 2015-05-22 DIAGNOSIS — Z23 Encounter for immunization: Secondary | ICD-10-CM

## 2015-05-22 DIAGNOSIS — M159 Polyosteoarthritis, unspecified: Secondary | ICD-10-CM

## 2015-05-22 DIAGNOSIS — Z780 Asymptomatic menopausal state: Secondary | ICD-10-CM

## 2015-05-22 DIAGNOSIS — M8580 Other specified disorders of bone density and structure, unspecified site: Secondary | ICD-10-CM

## 2015-05-22 LAB — HEMOGLOBIN A1C: HEMOGLOBIN A1C: 6.4 % (ref 4.6–6.5)

## 2015-05-22 LAB — COMPREHENSIVE METABOLIC PANEL
ALBUMIN: 4.1 g/dL (ref 3.5–5.2)
ALK PHOS: 53 U/L (ref 39–117)
ALT: 14 U/L (ref 0–35)
AST: 17 U/L (ref 0–37)
BILIRUBIN TOTAL: 0.7 mg/dL (ref 0.2–1.2)
BUN: 16 mg/dL (ref 6–23)
CO2: 29 meq/L (ref 19–32)
CREATININE: 0.77 mg/dL (ref 0.40–1.20)
Calcium: 9.6 mg/dL (ref 8.4–10.5)
Chloride: 104 mEq/L (ref 96–112)
GFR: 92.13 mL/min (ref >60.00)
GLUCOSE: 87 mg/dL (ref 70–99)
Potassium: 4.3 mEq/L (ref 3.5–5.1)
Sodium: 140 mEq/L (ref 135–145)
Total Protein: 7.3 g/dL (ref 6.0–8.3)

## 2015-05-22 LAB — MICROALBUMIN / CREATININE URINE RATIO
Creatinine,U: 157.6 mg/dL
MICROALB/CREAT RATIO: 0.5 mg/g (ref 0.0–30.0)
Microalb, Ur: 0.8 mg/dL (ref 0.0–1.9)

## 2015-05-22 LAB — LIPID PANEL
CHOLESTEROL: 202 mg/dL — AB (ref 0–200)
HDL: 49 mg/dL (ref >39.00)
LDL CALC: 128 mg/dL — AB (ref 0–99)
NonHDL: 153
Total CHOL/HDL Ratio: 4
Triglycerides: 127 mg/dL (ref 0.0–149.0)
VLDL: 25.4 mg/dL (ref 0.0–40.0)

## 2015-05-22 LAB — TSH: TSH: 1.48 u[IU]/mL (ref 0.35–4.50)

## 2015-05-22 LAB — HM DIABETES FOOT EXAM

## 2015-05-22 NOTE — Patient Instructions (Signed)
Get your blood work before you leave      Unity cause injuries and can affect all age groups. It is possible to use preventive measures to significantly decrease the likelihood of falls. There are many simple measures which can make your home safer and prevent falls. OUTDOORS  Repair cracks and edges of walkways and driveways.  Remove high doorway thresholds.  Trim shrubbery on the main path into your home.  Have good outside lighting.  Clear walkways of tools, rocks, debris, and clutter.  Check that handrails are not broken and are securely fastened. Both sides of steps should have handrails.  Have leaves, snow, and ice cleared regularly.  Use sand or salt on walkways during winter months.  In the garage, clean up grease or oil spills. BATHROOM  Install night lights.  Install grab bars by the toilet and in the tub and shower.  Use non-skid mats or decals in the tub or shower.  Place a plastic non-slip stool in the shower to sit on, if needed.  Keep floors dry and clean up all water on the floor immediately.  Remove soap buildup in the tub or shower on a regular basis.  Secure bath mats with non-slip, double-sided rug tape.  Remove throw rugs and tripping hazards from the floors. BEDROOMS  Install night lights.  Make sure a bedside light is easy to reach.  Do not use oversized bedding.  Keep a telephone by your bedside.  Have a firm chair with side arms to use for getting dressed.  Remove throw rugs and tripping hazards from the floor. KITCHEN  Keep handles on pots and pans turned toward the center of the stove. Use back burners when possible.  Clean up spills quickly and allow time for drying.  Avoid walking on wet floors.  Avoid hot utensils and knives.  Position shelves so they are not too high or low.  Place commonly used objects within easy reach.  If necessary, use a sturdy step stool with a grab bar when  reaching.  Keep electrical cables out of the way.  Do not use floor polish or wax that makes floors slippery. If you must use wax, use non-skid floor wax.  Remove throw rugs and tripping hazards from the floor. STAIRWAYS  Never leave objects on stairs.  Place handrails on both sides of stairways and use them. Fix any loose handrails. Make sure handrails on both sides of the stairways are as long as the stairs.  Check carpeting to make sure it is firmly attached along stairs. Make repairs to worn or loose carpet promptly.  Avoid placing throw rugs at the top or bottom of stairways, or properly secure the rug with carpet tape to prevent slippage. Get rid of throw rugs, if possible.  Have an electrician put in a light switch at the top and bottom of the stairs. OTHER FALL PREVENTION TIPS  Wear low-heel or rubber-soled shoes that are supportive and fit well. Wear closed toe shoes.  When using a stepladder, make sure it is fully opened and both spreaders are firmly locked. Do not climb a closed stepladder.  Add color or contrast paint or tape to grab bars and handrails in your home. Place contrasting color strips on first and last steps.  Learn and use mobility aids as needed. Install an electrical emergency response system.  Turn on lights to avoid dark areas. Replace light bulbs that burn out immediately. Get light switches that glow.  Arrange furniture to create clear pathways. Keep furniture in the same place.  Firmly attach carpet with non-skid or double-sided tape.  Eliminate uneven floor surfaces.  Select a carpet pattern that does not visually hide the edge of steps.  Be aware of all pets. OTHER HOME SAFETY TIPS  Set the water temperature for 120 F (48.8 C).  Keep emergency numbers on or near the telephone.  Keep smoke detectors on every level of the home and near sleeping areas. Document Released: 10/10/2002 Document Revised: 04/20/2012 Document Reviewed:  01/09/2012 [facility] Patient Information 2015 [place], [place]. This information is not intended to replace advice given to you by your health care provider. Make sure you discuss any questions you have with your health care provider.   Preventive Care for Adults Ages 79 and over  Blood pressure check.** / Every 1 to 2 years.  Lipid and cholesterol check.**/ Every 5 years beginning at age 65.  Lung cancer screening. / Every year if you are aged 79-80 years and have a 30-pack-year history of smoking and currently smoke or have quit within the past 15 years. Yearly screening is stopped once you have quit smoking for at least 15 years or develop a health problem that would prevent you from having lung cancer treatment.  Fecal occult blood test (FOBT) of stool. / Every year beginning at age 79 and continuing until age 25. You may not have to do this test if you get a colonoscopy every 10 years.  Flexible sigmoidoscopy** or colonoscopy.** / Every 5 years for a flexible sigmoidoscopy or every 10 years for a colonoscopy beginning at age 79 and continuing until age 70.  Hepatitis C blood test.** / For all people born from [birth year] through 1963 and any individual with known risks for hepatitis C.  Abdominal aortic aneurysm (AAA) screening.** / A one-time screening for ages 79 to 79 years who are current or former smokers.  Skin self-exam. / Monthly.  Influenza vaccine. / Every year.  Tetanus, diphtheria, and acellular pertussis (Tdap/Td) vaccine.** / 1 dose of Td every 10 years.  Varicella vaccine.** / Consult your health care provider.  Zoster vaccine.** / 1 dose for adults aged 79 years or older.  Pneumococcal 13-valent conjugate (PCV13) vaccine.** / Consult your health care provider.  Pneumococcal polysaccharide (PPSV23) vaccine.** / 1 dose for all adults aged 79 years and older.  Meningococcal vaccine.** / Consult your health care provider.  Hepatitis A vaccine.** / Consult your health care  provider.  Hepatitis B vaccine.** / Consult your health care provider.  Haemophilus influenzae type b (Hib) vaccine.** / Consult your health care provider. **Family history and personal history of risk and conditions may change your health care provider's recommendations. Document Released: 12/16/2001 Document Revised: 10/25/2013 Document Reviewed: 03/17/2011 [facility] Patient Information 2015 [place], [place]. This information is not intended to replace advice given to you by your health care provider. Make sure you discuss any questions you have with your health care provider.

## 2015-05-22 NOTE — Progress Notes (Signed)
Pre visit review using our clinic review tool, if applicable. No additional management support is needed unless otherwise documented below in the visit note. 

## 2015-05-22 NOTE — Assessment & Plan Note (Addendum)
Currently on metformin, check A1c and microalbumin. Feet exam normal today

## 2015-05-22 NOTE — Progress Notes (Signed)
Subjective:    Patient ID: [name], female    DOB: 10-18-1930, 81 y.o.   MRN: [id number]  DOS:  05/22/2015 Type of visit - description :  Here for Medicare AWV:  1. Risk factors based on Past M, S, F history: reviewed 2. Physical Activities:  Very active, no routine exercise  3. Depression/mood: neg screening  4. Hearing:  No problems noted or reported , has tinnitus (at baseline) 5. ADL's: independent, drives  6. Fall Risk: no recent falls, prevention discussed , see AVS 7. home Safety: does feel safe at home  8. Height, weight, & visual acuity: see VS, sees eye doctor regulalrly 9. Counseling: provided 10. Labs ordered based on risk factors: if needed  11. Referral Coordination: if needed 12. Care Plan, see assessment and plan , written personalized plan provided , see AVS 13. Cognitive Assessment: motor skills and cognition appropriate for age 60. Care team updated, see Snap Shot  15. End-of-life care discussed , has healthcare POA  In addition, today we discussed the following: Diabetes, on metformin, CBG yesterday 97. Shoulder pain, saw sports medicine, note reviewed. Dizziness, still has some symptoms, neurology eval pending   Review of Systems  Constitutional: No fever. No chills. No unexplained wt changes. No unusual sweats  HEENT: No dental problems, no ear discharge, no facial swelling, no voice changes. No eye discharge, no eye  redness , no  intolerance to light   Respiratory: No wheezing , no  difficulty breathing. No cough , no mucus production  Cardiovascular: No CP, no leg swelling , no  Palpitations  GI: no nausea, no vomiting, no diarrhea , no  abdominal pain.  No blood in the stools. No dysphagia, no odynophagia    Endocrine: No polyphagia, no polyuria , no polydipsia  GU: No dysuria, gross hematuria, difficulty urinating. No urinary urgency, no frequency.  Musculoskeletal: No joint swellings   Skin: No change in the color of the skin,  palor , no  Rash  Allergic, immunologic: No environmental allergies , no  food allergies  Neurological:   no  syncope. No headaches. No diplopia, no slurred, no slurred speech, no motor deficits, no facial  Numbness  Hematological: No enlarged lymph nodes, no easy bruising , no unusual bleedings  Psychiatry: No suicidal ideas, no hallucinations, no beavior problems, no confusion.  No unusual/severe anxiety, no depression   Past Medical History  Diagnosis Date  . Osteopenia   . Abnormal breast exam 2004    MMG neg (eval by surgeon neg)  . Hx SBO 2004 , 2007, 2012     exploratory surgery 9-12   . Iritis 2005  . Type II diabetes mellitus   . Anemia   . History of blood transfusion     "related to a surgery, I think"  . Osteoarthritis   . Arthritis     "knees" (07/11/2014)  . SBO (small bowel obstruction) 07-2014    Past Surgical History  Procedure Laterality Date  . Total knee arthroplasty Right 10/2010  . Exploratory laparotomy  07/2011    SBO  . Appendectomy    . Total abdominal hysterectomy  1970's    for bleeding, no cancer  . Dilation and curettage of uterus      History   Social History  . Marital Status: Widowed    Spouse Name: N/A  . Number of Children: 2  . Years of Education: N/A   Occupational History  . Has her own business  makes aprons    Social History Main Topics  . Smoking status: Never Smoker   . Smokeless tobacco: Never Used  . Alcohol Use: No  . Drug Use: No  . Sexual Activity: No   Other Topics Concern  . Not on file   Social History Narrative   Lives by herself, her family is in [place], lost 2 sisters               Family History  Problem Relation Age of Onset  . Diabetes Father   . Heart attack Neg Hx   . Breast cancer Sister   . Sudden death Sister   . Colon cancer Neg Hx        Medication List       This list is accurate as of: 05/22/15  8:41 PM.  Always use your most recent med list.               aspirin  81 MG tablet  Take 81 mg by mouth daily.     CENTRUM SILVER PO  Take 1 capsule by mouth daily.     diclofenac 75 MG EC tablet  Commonly known as:  VOLTAREN  Take 1 tablet (75 mg total) by mouth 2 (two) times daily.     metFORMIN 500 MG tablet  Commonly known as:  GLUCOPHAGE  TAKE 1 TABLET TWICE A DAY WITH A MEAL           Objective:   Physical Exam BP 118/66 mmHg  Pulse 77  Temp(Src) 97.6 F (36.4 C) (Oral)  Ht 5\' 5"  (1.651 m)  Wt 195 lb 6 oz (88.622 kg)  BMI 32.51 kg/m2  SpO2 98%    General:   Well developed, well nourished . NAD.  Neck:  Full range of motion. Supple. No  thyromegaly , normal carotid pulse, no carotid bruit HEENT:  Normocephalic . Face symmetric, atraumatic Lungs:  CTA B Normal respiratory effort, no intercostal retractions, no accessory muscle use. Heart: RRR,  no murmur.  No pretibial edema bilaterally  Abdomen:  Not distended, soft, non-tender. No rebound or rigidity. No bruit Diabetic feet exam: No edema, skin normal, pinprick examination normal, good pedal pulses bilaterally Skin: Exposed areas without rash. Not pale. Not jaundice Neurologic:  alert & oriented X3.  Speech normal, gait appropriate for age and unassisted Strength symmetric and appropriate for age.  Psych: Cognition and judgment appear intact.  Cooperative with normal attention span and concentration.  Behavior appropriate. No anxious or depressed appearing.  Assessment & Plan:

## 2015-05-22 NOTE — Assessment & Plan Note (Addendum)
Td 09 Pneumonia shot 10-05 and 2010 Prevnar-- today shingles shot -- 2014d  2004: EGD and Cscope both normal   Cscope 03/2014 Diverticulosis, no need to repeat due to age and results  No further PAPs, see previous entry Pt desires no more MMG , agree;  many med societies rec to d/c MMG at age 81  Diet-exercise: she has a healthy lifestyle

## 2015-05-22 NOTE — Assessment & Plan Note (Addendum)
Labs,  Not taking any medication intolerant to simvstatin and tia

## 2015-05-22 NOTE — Assessment & Plan Note (Signed)
We will schedule a bone density test

## 2015-05-22 NOTE — Assessment & Plan Note (Addendum)
Shoulder pain: Saw sports medicine, diagnosed with rotator cuff impingement, on diclofenac, not improving much, rec to go back to [facility] if needed

## 2015-05-22 NOTE — Addendum Note (Signed)
Addended by: Kathlene November E on: 05/22/2015 08:45 PM   Modules accepted: Miquel Dunn

## 2015-05-23 ENCOUNTER — Ambulatory Visit (INDEPENDENT_AMBULATORY_CARE_PROVIDER_SITE_OTHER): Payer: Medicare Other | Admitting: Neurology

## 2015-05-23 ENCOUNTER — Encounter: Payer: Self-pay | Admitting: Neurology

## 2015-05-23 ENCOUNTER — Telehealth: Payer: Self-pay | Admitting: Family Medicine

## 2015-05-23 ENCOUNTER — Other Ambulatory Visit (INDEPENDENT_AMBULATORY_CARE_PROVIDER_SITE_OTHER): Payer: Medicare Other

## 2015-05-23 VITALS — BP 120/70 | HR 80 | Resp 16 | Ht 65.0 in | Wt 197.0 lb

## 2015-05-23 DIAGNOSIS — E785 Hyperlipidemia, unspecified: Secondary | ICD-10-CM

## 2015-05-23 DIAGNOSIS — H9312 Tinnitus, left ear: Secondary | ICD-10-CM | POA: Diagnosis not present

## 2015-05-23 DIAGNOSIS — G629 Polyneuropathy, unspecified: Secondary | ICD-10-CM

## 2015-05-23 DIAGNOSIS — R42 Dizziness and giddiness: Secondary | ICD-10-CM

## 2015-05-23 DIAGNOSIS — Z79899 Other long term (current) drug therapy: Secondary | ICD-10-CM

## 2015-05-23 LAB — VITAMIN B12: Vitamin B-12: 454 pg/mL (ref 211–911)

## 2015-05-23 NOTE — Assessment & Plan Note (Signed)
Intolerant to simvastatin, Lipitor and Zetia

## 2015-05-23 NOTE — Progress Notes (Signed)
NEUROLOGY CONSULTATION NOTE  [name] MRN: [id number] DOB: 1930-03-25  Referring provider: Dr. Kathlene November Primary care provider: Dr. Kathlene November  Reason for consult:  dizziness  Dear Dr Larose Kells:  Thank you for your kind referral of Byron Tipping for consultation of the above symptoms. Although her history is well known to you, please allow me to reiterate it for the purpose of our medical record. Records and images were personally reviewed where available.  HISTORY OF PRESENT ILLNESS: This is a pleasant 81 year old right-handed woman with a history of diabetes, diet-controlled hyperlipidemia, presenting for evaluation of dizziness, gait unsteadiness, and positive Romberg sign during PCP visit last month. She reports that symptoms started a year ago, when she started having left-sided pulsatile tinnitus. Initially tinnitus would come and go, but now it has become constant, more when lying on her left side. She describes it as "like rattling paper." She denies any hearing loss or ear pain. After the tinnitus started, she then started getting unstable. Symptoms only occur when standing, she would suddenly feel a floating sensation and feel unstable, bumping into walls. This would last a few seconds, she denies any falls. She denies any blurred or double vision with these, but would "see stars" when she feels this way. No associated nausea, vomiting, chest pain, shortness of breath. Last month it was occurring on a daily basis. This has lessened and now occurs around once a week on average. She denies any focal numbness/tingling, dysarthria/dysphagia, neck/back pain, bowel/bladder dysfunction. She has a remote history of catamenial migraines, and this week reports a couple of headaches which is unusual for her. They start in the back of her head and shoot up, lasting only a few minutes. She can still function and would not take any medication. She feels her right arm is weak and attributes this to right  shoulder problems. She woke up with hoarseness this morning, no sore throat. She denies any significant recent head injuries or infections. No family history of similar symptoms. She reports diabetes is well-controlled. Recent HbA1c 6.4.  Laboratory Data: Lab Results  Component Value Date   WBC 8.4 10/23/2014   HGB 11.9* 10/23/2014   HCT 38.1 10/23/2014   MCV 85.2 10/23/2014   PLT 224.0 10/23/2014     Chemistry      Component Value Date/Time   NA 140 05/22/2015 1339   K 4.3 05/22/2015 1339   CL 104 05/22/2015 1339   CO2 29 05/22/2015 1339   BUN 16 05/22/2015 1339   CREATININE 0.77 05/22/2015 1339      Component Value Date/Time   CALCIUM 9.6 05/22/2015 1339   ALKPHOS 53 05/22/2015 1339   AST 17 05/22/2015 1339   ALT 14 05/22/2015 1339   BILITOT 0.7 05/22/2015 1339     Lab Results  Component Value Date   TSH 1.48 05/22/2015   Lab Results  Component Value Date   HGBA1C 6.4 05/22/2015   PAST MEDICAL HISTORY: Past Medical History  Diagnosis Date  . Osteopenia   . Abnormal breast exam 2004    MMG neg (eval by surgeon neg)  . Hx SBO 2004 , 2007, 2012     exploratory surgery 9-12   . Iritis 2005  . Type II diabetes mellitus   . Anemia   . History of blood transfusion     "related to a surgery, I think"  . Osteoarthritis   . Arthritis     "knees" (07/11/2014)  . SBO (small bowel obstruction) 0-2409  PAST SURGICAL HISTORY: Past Surgical History  Procedure Laterality Date  . Total knee arthroplasty Right 10/2010  . Exploratory laparotomy  07/2011    SBO  . Appendectomy    . Total abdominal hysterectomy  1970's    for bleeding, no cancer  . Dilation and curettage of uterus      MEDICATIONS: Current Outpatient Prescriptions on File Prior to Visit  Medication Sig Dispense Refill  . aspirin 81 MG tablet Take 81 mg by mouth daily.      . diclofenac (VOLTAREN) 75 MG EC tablet Take 1 tablet (75 mg total) by mouth 2 (two) times daily. 60 tablet 1  . metFORMIN  (GLUCOPHAGE) 500 MG tablet TAKE 1 TABLET TWICE A DAY WITH A MEAL 180 tablet 1  . Multiple Vitamins-Minerals (CENTRUM SILVER PO) Take 1 capsule by mouth daily.       No current facility-administered medications on file prior to visit.    ALLERGIES: Allergies  Allergen Reactions  . Zocor [Simvastatin]     Ear ringing  . Lipitor [Atorvastatin]     tinnitus    FAMILY HISTORY: Family History  Problem Relation Age of Onset  . Diabetes Father   . Heart attack Neg Hx   . Breast cancer Sister   . Sudden death Sister   . Colon cancer Neg Hx     SOCIAL HISTORY: History   Social History  . Marital Status: Widowed    Spouse Name: N/A  . Number of Children: 2  . Years of Education: N/A   Occupational History  . Has her own business     makes aprons    Social History Main Topics  . Smoking status: Never Smoker   . Smokeless tobacco: Never Used  . Alcohol Use: No  . Drug Use: No  . Sexual Activity: No   Other Topics Concern  . Not on file   Social History Narrative   Lives by herself, her family is in [place], lost 2 sisters              REVIEW OF SYSTEMS: Constitutional: No fevers, chills, or sweats, no generalized fatigue, change in appetite Eyes: No visual changes, double vision, eye pain Ear, nose and throat: No hearing loss, ear pain, nasal congestion, sore throat Cardiovascular: No chest pain, palpitations Respiratory:  No shortness of breath at rest or with exertion, wheezes GastrointestinaI: No nausea, vomiting, diarrhea, abdominal pain, fecal incontinence Genitourinary:  No dysuria, urinary retention or frequency Musculoskeletal:  No neck pain, back pain. +right knee pain Integumentary: No rash, pruritus, skin lesions Neurological: as above Psychiatric: No depression, insomnia, anxiety Endocrine: No palpitations, fatigue, diaphoresis, mood swings, change in appetite, change in weight, increased thirst Hematologic/Lymphatic:  No anemia, purpura,  petechiae. Allergic/Immunologic: no itchy/runny eyes, nasal congestion, recent allergic reactions, rashes  PHYSICAL EXAM: Filed Vitals:   05/23/15 0841  BP: 120/70  Pulse: 80  Resp: 16   General: No acute distress Head:  Normocephalic/atraumatic Eyes: Fundoscopic exam shows bilateral sharp discs, no vessel changes, exudates, or hemorrhages Neck: supple, no paraspinal tenderness, full range of motion Back: No paraspinal tenderness Heart: regular rate and rhythm Lungs: Clear to auscultation bilaterally. Vascular: No carotid bruits. Skin/Extremities: No rash, no edema Neurological Exam: Mental status: alert and oriented to person, place, and time, no dysarthria or aphasia, Fund of knowledge is appropriate.  Recent and remote memory are intact. 2/3 delayed recall. Attention and concentration are normal.    Able to name objects and repeat phrases. Cranial  nerves: CN I: not tested CN II: pupils equal, round and reactive to light, visual fields intact, fundi unremarkable. CN III, IV, VI:  full range of motion, no nystagmus, no ptosis CN V: facial sensation intact CN VII: upper and lower face symmetric CN VIII: hearing intact to finger rub CN IX, X: gag intact, uvula midline CN XI: sternocleidomastoid and trapezius muscles intact CN XII: tongue midline Bulk & Tone: normal, no fasciculations. Motor: 5/5 throughout with no pronator drift. Sensation: decreased vibration to both ankles, otherwise intact to light touch, cold, pin, and joint position sense.  No extinction to double simultaneous stimulation.  Romberg test positive Deep Tendon Reflexes: +1 on both UE, unable to elicit reflexes in both LE, no ankle clonus Plantar responses: downgoing bilaterally Cerebellar: no incoordination on finger to nose, heel to shin. No dysdiadochokinesia Gait: slow and cautious due to right knee pain, unable to tandem walk due to right knee pain Tremor: none  IMPRESSION: This is a pleasant 81 year old  right-handed woman with a history of diabetes, diet-controlled hyperlipidemia, presenting with a 1-year history of left-sided tinnitus followed by episodes of gait unsteadiness with associated lightheadedness and "seeing stars." Exam today shows evidence of peripheral neuropathy with positive Romberg sign. With the unilateral tinnitus and dizziness, MRI brain with and without contrast will be ordered to assess for underlying structural abnormality. B12 level will be added on for neuropathy labs. We discussed that if tests are unremarkable, the unsteadiness is likely due to peripheral neuropathy and referral for balance therapy will be done. She may benefit from ENT evaluation for the unilateral tinnitus. She will follow-up in 2 months.   Thank you for allowing me to participate in the care of this patient. Please do not hesitate to call for any questions or concerns.   Ellouise Newer, M.D.  CC: Dr. Larose Kells

## 2015-05-23 NOTE — Telephone Encounter (Signed)
Called patient to give her appt information for her MRI of the brain. She has been scheduled @ [facility] on 05/31/15 @ 12:45pm with a 12:15pm arrival. Address & phone number were given.

## 2015-05-23 NOTE — Patient Instructions (Signed)
1. Schedule MRI brain with and without contrast 2. We will try to add-on a B12 level to bloodwork done yesterday at Dr. Ethel Rana office 3. If studies come back normal, we will plan for Balance therapy 4. Follow-up in 2 months

## 2015-05-24 ENCOUNTER — Telehealth: Payer: Self-pay | Admitting: Family Medicine

## 2015-05-24 ENCOUNTER — Other Ambulatory Visit: Payer: Self-pay

## 2015-05-24 MED ORDER — PITAVASTATIN CALCIUM 2 MG PO TABS: 1.0000 | ORAL_TABLET | Freq: Every day | ORAL | Status: DC

## 2015-05-24 MED ORDER — METFORMIN HCL 500 MG PO TABS: 500.0000 mg | ORAL_TABLET | Freq: Two times a day (BID) | ORAL | Status: DC

## 2015-05-24 NOTE — Telephone Encounter (Signed)
Patient was notified of results.  

## 2015-05-24 NOTE — Addendum Note (Signed)
Addended by: Wilfrid Lund on: 05/24/2015 02:44 PM   Modules accepted: Orders

## 2015-05-24 NOTE — Telephone Encounter (Signed)
-----   Message from Cameron Sprang, MD sent at 05/23/2015  1:00 PM EDT ----- Pls let her know B12 level is normal, thanks

## 2015-05-28 DIAGNOSIS — D170 Benign lipomatous neoplasm of skin and subcutaneous tissue of head, face and neck: Secondary | ICD-10-CM | POA: Diagnosis not present

## 2015-05-28 DIAGNOSIS — M852 Hyperostosis of skull: Secondary | ICD-10-CM | POA: Diagnosis not present

## 2015-05-28 DIAGNOSIS — G930 Cerebral cysts: Secondary | ICD-10-CM | POA: Diagnosis not present

## 2015-05-28 DIAGNOSIS — I6782 Cerebral ischemia: Secondary | ICD-10-CM | POA: Diagnosis not present

## 2015-05-28 DIAGNOSIS — E236 Other disorders of pituitary gland: Secondary | ICD-10-CM | POA: Diagnosis not present

## 2015-05-29 ENCOUNTER — Ambulatory Visit (HOSPITAL_BASED_OUTPATIENT_CLINIC_OR_DEPARTMENT_OTHER)
Admission: RE | Admit: 2015-05-29 | Discharge: 2015-05-29 | Disposition: A | Discharge status: Home / Self Care | Payer: Medicare Other | Source: Ambulatory Visit | Attending: Internal Medicine | Admitting: Internal Medicine

## 2015-05-29 DIAGNOSIS — Z780 Asymptomatic menopausal state: Secondary | ICD-10-CM | POA: Insufficient documentation

## 2015-05-29 DIAGNOSIS — M85852 Other specified disorders of bone density and structure, left thigh: Secondary | ICD-10-CM | POA: Diagnosis not present

## 2015-05-30 ENCOUNTER — Telehealth: Payer: Self-pay | Admitting: Neurology

## 2015-05-30 DIAGNOSIS — M899 Disorder of bone, unspecified: Secondary | ICD-10-CM

## 2015-05-30 DIAGNOSIS — D352 Benign neoplasm of pituitary gland: Secondary | ICD-10-CM

## 2015-05-30 NOTE — Telephone Encounter (Signed)
Labs and scan were ordered. Notified patient that scan has been scheduled for 06/18/15 @ [place] appt time of 10:00 am. Will mail lab orders to her and she will have her pcp's office draw for tests.

## 2015-05-30 NOTE — Telephone Encounter (Signed)
Discussed MRI findings, no acute changes that would cause dizziness and tinnitus. We had discussed balance therapy if this is the case, she states she feels better and would like to hold off on balance therapy for now. Discussed other MRI findings, including multiple calvarial lesions of unclear etiology. She has noticed the bumps on her skull for the past 3-4 years. Recommendation is for whole body bone scan to r/o metastatic disease. Also discussed possible pituitary adenoma, recommendation is bloodwork. Patient expressed understanding.  Tiff, can you pls order: 1. Whole body bone scan for diagnosis of calvarial lesions, r/o metastatic disease 2. Mail labslip for bloodwork for pituitary adenoma: Check prolactin, TSH, LH, FSH, free T4  Thanks, KA

## 2015-06-04 ENCOUNTER — Telehealth: Payer: Self-pay | Admitting: Internal Medicine

## 2015-06-04 ENCOUNTER — Telehealth: Payer: Self-pay | Admitting: Neurology

## 2015-06-04 ENCOUNTER — Other Ambulatory Visit (INDEPENDENT_AMBULATORY_CARE_PROVIDER_SITE_OTHER): Payer: Medicare Other

## 2015-06-04 DIAGNOSIS — D352 Benign neoplasm of pituitary gland: Secondary | ICD-10-CM

## 2015-06-04 LAB — T4, FREE: FREE T4: 1.11 ng/dL (ref 0.80–1.80)

## 2015-06-04 LAB — TSH: TSH: 1.682 u[IU]/mL (ref 0.350–4.500)

## 2015-06-04 MED ORDER — PRAVASTATIN SODIUM 10 MG PO TABS: 10.0000 mg | ORAL_TABLET | Freq: Every day | ORAL | Status: DC

## 2015-06-04 NOTE — Telephone Encounter (Signed)
Pt started Actos on 05/24/2015, wanted to inform it was very expensive and wanting to know if you could recommend a cheaper medication.

## 2015-06-04 NOTE — Telephone Encounter (Signed)
Caller name: Rashay Relation to JK:DTOI Call back number: [phone] Pharmacy:  Reason for call: Pt came in office for lab appt, pt states is wanting to talk with Larose Kells assistant related to her prescription since she is going out of town tomorrow morning and is wanting to ask about her cholesterol medication. Pt is going to be at lab. Please advise.(pt states can be text to number above.)

## 2015-06-04 NOTE — Telephone Encounter (Signed)
Pt called and is having some confusion between her Labs and MRI and would like a call back/Dawn CB# [phone]

## 2015-06-04 NOTE — Telephone Encounter (Signed)
I returned patient's call. She wanted clarification on where she was supposed to get her lab work done. As per our phone conversation last week I mailed her a lab order to take to her pcp's office for blood draw. She is going to call her pcp's office to get a lab appt for additional labs per Dr. Delice Lesch results when ready should be faxed to Dr. Delice Lesch for her review.

## 2015-06-04 NOTE — Telephone Encounter (Signed)
Spoke with Pt, informed her to d/c Livalo and start Pravachol 10 mg 1 tablet daily at bedtime. Pt questioned cost of medication which I informed her I didn't know. Informed her that she can call pharmacy before she goes to pick up medication and they should be able to inform her of cost. Pt verbalized understanding.

## 2015-06-04 NOTE — Telephone Encounter (Signed)
Correction: Livalo is expensive. We could to try a low dose of Pravachol 10 mg one by mouth daily at bedtime #30 and 3 refills. Check cholesterol as planned. Call if side effects

## 2015-06-05 LAB — FOLLICLE STIMULATING HORMONE: FSH: 59.4 m[IU]/mL

## 2015-06-05 LAB — PROLACTIN: PROLACTIN: 9.8 ng/mL

## 2015-06-05 LAB — LUTEINIZING HORMONE: LH: 22.6 m[IU]/mL

## 2015-06-06 ENCOUNTER — Telehealth: Payer: Self-pay | Admitting: Family Medicine

## 2015-06-06 NOTE — Telephone Encounter (Signed)
Left msg on patient's vm that lab tests were normal. Asked to call me back if any further questions.

## 2015-06-06 NOTE — Telephone Encounter (Signed)
-----   Message from Cameron Sprang, MD sent at 06/05/2015  1:08 PM EDT ----- Pls let her know hormone levels are normal, thanks

## 2015-06-08 ENCOUNTER — Encounter (HOSPITAL_COMMUNITY): Payer: Medicare Other

## 2015-06-08 ENCOUNTER — Ambulatory Visit (HOSPITAL_COMMUNITY): Payer: Medicare Other

## 2015-06-18 ENCOUNTER — Encounter (HOSPITAL_COMMUNITY): Payer: Medicare Other

## 2015-06-18 ENCOUNTER — Encounter (HOSPITAL_COMMUNITY)
Admission: RE | Admit: 2015-06-18 | Discharge: 2015-06-18 | Disposition: A | Discharge status: Home / Self Care | Payer: Medicare Other | Source: Ambulatory Visit | Attending: Neurology | Admitting: Neurology

## 2015-06-18 DIAGNOSIS — M899 Disorder of bone, unspecified: Secondary | ICD-10-CM | POA: Insufficient documentation

## 2015-06-18 DIAGNOSIS — M170 Bilateral primary osteoarthritis of knee: Secondary | ICD-10-CM | POA: Diagnosis not present

## 2015-06-18 DIAGNOSIS — M47812 Spondylosis without myelopathy or radiculopathy, cervical region: Secondary | ICD-10-CM | POA: Diagnosis not present

## 2015-06-18 MED ORDER — TECHNETIUM TC 99M MEDRONATE IV KIT
25.0000 | PACK | Freq: Once | INTRAVENOUS | Status: AC | PRN
  Administered 2015-06-18: 25 via INTRAVENOUS

## 2015-06-19 ENCOUNTER — Telehealth: Payer: Self-pay | Admitting: Family Medicine

## 2015-06-19 NOTE — Telephone Encounter (Signed)
Patient was notified of results.  

## 2015-06-19 NOTE — Telephone Encounter (Signed)
-----   Message from Cameron Sprang, MD sent at 06/19/2015  8:37 AM EDT ----- Pls let her know bone scan looks good, it shows benign thickening of bones, which can happen in women after menopause. Thanks

## 2015-06-26 ENCOUNTER — Encounter: Payer: Self-pay | Admitting: Neurology

## 2015-07-08 ENCOUNTER — Other Ambulatory Visit: Payer: Self-pay | Admitting: Family Medicine

## 2015-07-24 ENCOUNTER — Ambulatory Visit: Payer: Medicare Other | Admitting: Neurology

## 2015-08-01 ENCOUNTER — Encounter: Payer: Self-pay | Admitting: Neurology

## 2015-08-01 ENCOUNTER — Ambulatory Visit (INDEPENDENT_AMBULATORY_CARE_PROVIDER_SITE_OTHER): Payer: Medicare Other | Admitting: Neurology

## 2015-08-01 VITALS — BP 100/64 | HR 79 | Resp 16 | Ht 65.0 in | Wt 195.0 lb

## 2015-08-01 DIAGNOSIS — M852 Hyperostosis of skull: Secondary | ICD-10-CM

## 2015-08-01 DIAGNOSIS — R42 Dizziness and giddiness: Secondary | ICD-10-CM

## 2015-08-01 DIAGNOSIS — H9312 Tinnitus, left ear: Secondary | ICD-10-CM | POA: Diagnosis not present

## 2015-08-01 NOTE — Patient Instructions (Signed)
1. Use your cane while right hip is hurting 2. Follow up on an as needed basis, call our office for any changes

## 2015-08-01 NOTE — Progress Notes (Signed)
NEUROLOGY FOLLOW UP OFFICE NOTE  [name] [id number]  HISTORY OF PRESENT ILLNESS: I had the pleasure of seeing [name] in follow-up in the neurology clinic on 08/01/2015.  The patient was last seen 2 months ago for dizziness, gait unsteadiness, and unilateral tinnitus. Records and images were personally reviewed where available. I personally reviewed MRI brain with and without contrast which did not show any acute changes. There was note of a rounded nodular lesion in the right aspect of the pituitary gland just under 1 cm with diminished enhancement, suggestive of pituitary adenoma. No impingement of the optic chiasm. Skull imaging showed hyperostosis frontalis interna, with sizable lesions in the right frontal calvarium, as well as bilateral parietal regions. Whole body bone scan was recommended, which showed that areas in the calvarium were compatible with hyperostosis frontalis interna, a benign condition. Pituitary evaluation indicated normal hormone levels.    She reports doing very well since her last visit. She denies any dizziness or gait unsteadiness, except due to right hip and knee pain. She reports the tinnitus has stopped as well. She denies any headaches, diplopia, dysarthria, focal numbness/tingling, no falls.   HPI: This is a pleasant 81 yo RH woman with a history of diabetes, diet-controlled hyperlipidemia, who presented for evaluation of dizziness, gait unsteadiness, and positive Romberg sign during PCP visit. She reports that symptoms started a year ago, when she started having left-sided pulsatile tinnitus. Initially tinnitus would come and go, but now it has become constant, more when lying on her left side. She describes it as "like rattling paper." She denies any hearing loss or ear pain. After the tinnitus started, she then started getting unstable. Symptoms only occur when standing, she would suddenly feel a floating sensation and feel unstable, bumping into walls.  This would last a few seconds, she denies any falls. She denies any blurred or double vision with these, but would "see stars" when she feels this way. No associated nausea, vomiting, chest pain, shortness of breath. This started to occur on a daily basis, then lessened to once a week on average. She denies any focal numbness/tingling, dysarthria/dysphagia, neck/back pain, bowel/bladder dysfunction. She has a remote history of catamenial migraines, and this week reports a couple of headaches which is unusual for her. They start in the back of her head and shoot up, lasting only a few minutes. She can still function and would not take any medication. She feels her right arm is weak and attributes this to right shoulder problems. She woke up with hoarseness this morning, no sore throat. She denies any significant recent head injuries or infections. No family history of similar symptoms. She reports diabetes is well-controlled. Recent HbA1c 6.4.  PAST MEDICAL HISTORY: Past Medical History  Diagnosis Date  . Osteopenia   . Abnormal breast exam 2004    MMG neg (eval by surgeon neg)  . Hx SBO 2004 , 2007, 2012     exploratory surgery 9-12   . Iritis 2005  . Type II diabetes mellitus   . Anemia   . History of blood transfusion     "related to a surgery, I think"  . Osteoarthritis   . Arthritis     "knees" (07/11/2014)  . SBO (small bowel obstruction) 07-2014    MEDICATIONS: Current Outpatient Prescriptions on File Prior to Visit  Medication Sig Dispense Refill  . aspirin 81 MG tablet Take 81 mg by mouth daily.      . metFORMIN (GLUCOPHAGE) 500 MG tablet  Take 1 tablet (500 mg total) by mouth 2 (two) times daily with a meal. 180 tablet 1  . Multiple Vitamins-Minerals (CENTRUM SILVER PO) Take 1 capsule by mouth daily.      . pravastatin (PRAVACHOL) 10 MG tablet Take 1 tablet (10 mg total) by mouth at bedtime. 30 tablet 3   No current facility-administered medications on file prior to visit.     ALLERGIES: Allergies  Allergen Reactions  . Zocor [Simvastatin]     Ear ringing  . Lipitor [Atorvastatin]     tinnitus    FAMILY HISTORY: Family History  Problem Relation Age of Onset  . Diabetes Father   . Heart attack Neg Hx   . Breast cancer Sister   . Sudden death Sister   . Colon cancer Neg Hx     SOCIAL HISTORY: Social History   Social History  . Marital Status: Widowed    Spouse Name: N/A  . Number of Children: 2  . Years of Education: N/A   Occupational History  . Has her own business     makes aprons    Social History Main Topics  . Smoking status: Never Smoker   . Smokeless tobacco: Never Used  . Alcohol Use: No  . Drug Use: No  . Sexual Activity: No   Other Topics Concern  . Not on file   Social History Narrative   Lives by herself, her family is in [place], lost 2 sisters              REVIEW OF SYSTEMS: Constitutional: No fevers, chills, or sweats, no generalized fatigue, change in appetite Eyes: No visual changes, double vision, eye pain Ear, nose and throat: No hearing loss, ear pain, nasal congestion, sore throat Cardiovascular: No chest pain, palpitations Respiratory:  No shortness of breath at rest or with exertion, wheezes GastrointestinaI: No nausea, vomiting, diarrhea, abdominal pain, fecal incontinence Genitourinary:  No dysuria, urinary retention or frequency Musculoskeletal:  No neck pain, back pain. Right hip and knee pain Integumentary: No rash, pruritus, skin lesions Neurological: as above Psychiatric: No depression, insomnia, anxiety Endocrine: No palpitations, fatigue, diaphoresis, mood swings, change in appetite, change in weight, increased thirst Hematologic/Lymphatic:  No anemia, purpura, petechiae. Allergic/Immunologic: no itchy/runny eyes, nasal congestion, recent allergic reactions, rashes  PHYSICAL EXAM: Filed Vitals:   08/01/15 1125  BP: 100/64  Pulse: 79  Resp: 16   General: No acute distress Head:   Normocephalic/atraumatic Neck: supple, no paraspinal tenderness, full range of motion Heart:  Regular rate and rhythm Lungs:  Clear to auscultation bilaterally Back: No paraspinal tenderness Skin/Extremities: No rash, no edema Neurological Exam: alert and oriented to person, place, and time. No aphasia or dysarthria. Fund of knowledge is appropriate.  Recent and remote memory are intact.  Attention and concentration are normal.    Able to name objects and repeat phrases. Cranial nerves: Pupils equal, round, reactive to light.  Fundoscopic exam unremarkable, no papilledema. Extraocular movements intact with no nystagmus. Visual fields full. Facial sensation intact. No facial asymmetry. Tongue, uvula, palate midline.  Motor: Bulk and tone normal, muscle strength 5/5 throughout with no pronator drift.  Sensation to light touch intact. No extinction to double simultaneous stimulation.  Deep tendon reflexes 2+ throughout, toes downgoing.  Finger to nose testing intact.  Gait slow and cautious, favoring right leg due to hip pain. Romberg negative.  IMPRESSION: This is a pleasant 81 yo RH woman with a history of diabetes, diet-controlled hyperlipidemia, who presented with a 1-year  history of left-sided tinnitus followed by episodes of gait unsteadiness with associated lightheadedness and "seeing stars." She does have some peripheral neuropathy, but today reports that dizziness, gait unsteadiness, and tinnitus have resolved. I had discussed MRI and bone scan findings again with her, indicating benign hyperostosis frontalis interna. There was possibly slight enlargement of the pituitary gland, pituitary hormones normal, she is asymptomatic from this. She will discuss right hip and knee pain with her PCP and Ortho, and was advised to use her cane for balance. She was advised to see ENT if tinnitus recurs. She will follow-up on a prn basis and knows to call our office for any change in symptoms.   Thank you for  allowing me to participate in her care.  Please do not hesitate to call for any questions or concerns.  The duration of this appointment visit was 15 minutes of face-to-face time with the patient.  Greater than 50% of this time was spent in counseling, explanation of diagnosis, planning of further management, and coordination of care.   [name], M.D.   CC: Dr. Larose Kells

## 2015-08-29 ENCOUNTER — Telehealth: Payer: Self-pay | Admitting: Family Medicine

## 2015-08-29 NOTE — Telephone Encounter (Signed)
I don't think we've seen her for her hip before.  I'd have to examine her first to see what the problem is.  If it's bursitis of her hip, yes I can give her an injection but, again, it depends what the issue is.

## 2015-08-30 ENCOUNTER — Encounter: Payer: Self-pay | Admitting: Family Medicine

## 2015-08-30 ENCOUNTER — Ambulatory Visit (INDEPENDENT_AMBULATORY_CARE_PROVIDER_SITE_OTHER): Payer: Medicare Other | Admitting: Family Medicine

## 2015-08-30 VITALS — BP 106/67 | HR 67 | Ht 65.0 in | Wt 195.0 lb

## 2015-08-30 DIAGNOSIS — M25551 Pain in right hip: Secondary | ICD-10-CM | POA: Diagnosis not present

## 2015-08-30 MED ORDER — METHYLPREDNISOLONE ACETATE 40 MG/ML IJ SUSP
40.0000 mg | Freq: Once | INTRAMUSCULAR | Status: AC
  Administered 2015-08-30: 40 mg via INTRA_ARTICULAR

## 2015-08-30 NOTE — Patient Instructions (Signed)
You have IT band syndrome and trochanteric bursitis. Avoid painful activities as much as possible. Ice over area of pain 3-4 times a day for 15 minutes at a time Hip side raise exercise 3 sets of 10 once a day - add weights if this becomes too easy. Stretches - pick 2 and hold for 20-30 seconds x 3 - do once or twice a day. Tylenol and/or aleve as needed for pain. You were given a cortisone shot today. If not improving, can consider physical therapy. Follow up with me as needed.

## 2015-09-04 NOTE — Assessment & Plan Note (Signed)
2/2 trochanter bursitis, IT band syndrome.  Shown home exercises and stretches to do daily.  Icing, tylenol/nsaids if needed.  Trochanteric bursa injection given as well.  F/u in 6 weeks or prn.  After informed written consent patient was lying on left side on exam table.  Area overlying right trochanteric bursa prepped with alcohol swab then bursa injected with 6:2 marcaine: depomedrol.  Patient tolerated procedure well without immediate complications.

## 2015-09-04 NOTE — Progress Notes (Signed)
PCP: Kathlene November, MD  Subjective:   HPI: Patient is a 81 y.o. female here for right hip pain.  Patient denies known injury. She states over past 2 weeks she's had worsening lateral right hip pain. Pain level 5/10, sharp. Radiates down to knee. No numbness or tingling. Worse lying on this side. No skin changes, fever, other complaints.  Past Medical History  Diagnosis Date  . Osteopenia   . Abnormal breast exam 2004    MMG neg (eval by surgeon neg)  . Hx SBO 2004 , 2007, 2012     exploratory surgery 9-12   . Iritis 2005  . Type II diabetes mellitus ([place])   . Anemia   . History of blood transfusion     "related to a surgery, I think"  . Osteoarthritis   . Arthritis     "knees" (07/11/2014)  . SBO (small bowel obstruction) ([place]) 07-2014    Current Outpatient Prescriptions on File Prior to Visit  Medication Sig Dispense Refill  . aspirin 81 MG tablet Take 81 mg by mouth daily.      . metFORMIN (GLUCOPHAGE) 500 MG tablet Take 1 tablet (500 mg total) by mouth 2 (two) times daily with a meal. 180 tablet 1  . Multiple Vitamins-Minerals (CENTRUM SILVER PO) Take 1 capsule by mouth daily.      . pravastatin (PRAVACHOL) 10 MG tablet Take 1 tablet (10 mg total) by mouth at bedtime. 30 tablet 3   No current facility-administered medications on file prior to visit.    Past Surgical History  Procedure Laterality Date  . Total knee arthroplasty Right 10/2010  . Exploratory laparotomy  07/2011    SBO  . Appendectomy    . Total abdominal hysterectomy  1970's    for bleeding, no cancer  . Dilation and curettage of uterus      Allergies  Allergen Reactions  . Zocor [Simvastatin]     Ear ringing  . Lipitor [Atorvastatin]     tinnitus    Social History   Social History  . Marital Status: Widowed    Spouse Name: N/A  . Number of Children: 2  . Years of Education: N/A   Occupational History  . Has her own business     makes aprons    Social History Main Topics  . Smoking  status: Never Smoker   . Smokeless tobacco: Never Used  . Alcohol Use: No  . Drug Use: No  . Sexual Activity: No   Other Topics Concern  . Not on file   Social History Narrative   Lives by herself, her family is in [place], lost 2 sisters              Family History  Problem Relation Age of Onset  . Diabetes Father   . Heart attack Neg Hx   . Breast cancer Sister   . Sudden death Sister   . Colon cancer Neg Hx     BP 106/67 mmHg  Pulse 67  Ht 5\' 5"  (1.651 m)  Wt 195 lb (88.451 kg)  BMI 32.45 kg/m2  Review of Systems: See HPI above.    Objective:  Physical Exam:  Gen: NAD  Back: No gross deformity, scoliosis. No TTP .  No midline or bony TTP. FROM without pain. Strength LEs 5/5 all muscle groups except 3+/5 with right hip abduction. 2+ MSRs in patellar and achilles tendons, equal bilaterally. Negative SLRs. Sensation intact to light touch bilaterally.  Right hip: No gross  deformity, swelling, bruising. TTP trochanteric bursa, proximal IT band. Negative logroll bilateral hips Negative fabers and piriformis stretches.    Assessment & Plan:  1. Right hip pain - 2/2 trochanter bursitis, IT band syndrome.  Shown home exercises and stretches to do daily.  Icing, tylenol/nsaids if needed.  Trochanteric bursa injection given as well.  F/u in 6 weeks or prn.  After informed written consent patient was lying on left side on exam table.  Area overlying right trochanteric bursa prepped with alcohol swab then bursa injected with 6:2 marcaine: depomedrol.  Patient tolerated procedure well without immediate complications.

## 2015-10-05 DIAGNOSIS — H40013 Open angle with borderline findings, low risk, bilateral: Secondary | ICD-10-CM | POA: Diagnosis not present

## 2015-10-05 DIAGNOSIS — E119 Type 2 diabetes mellitus without complications: Secondary | ICD-10-CM | POA: Diagnosis not present

## 2015-10-05 DIAGNOSIS — H25813 Combined forms of age-related cataract, bilateral: Secondary | ICD-10-CM | POA: Diagnosis not present

## 2015-10-09 DIAGNOSIS — Z96651 Presence of right artificial knee joint: Secondary | ICD-10-CM | POA: Diagnosis not present

## 2015-10-09 DIAGNOSIS — M25561 Pain in right knee: Secondary | ICD-10-CM | POA: Diagnosis not present

## 2015-10-09 DIAGNOSIS — Z09 Encounter for follow-up examination after completed treatment for conditions other than malignant neoplasm: Secondary | ICD-10-CM | POA: Diagnosis not present

## 2015-10-09 DIAGNOSIS — M25551 Pain in right hip: Secondary | ICD-10-CM | POA: Diagnosis not present

## 2015-10-11 ENCOUNTER — Telehealth: Payer: Self-pay | Admitting: Internal Medicine

## 2015-10-11 NOTE — Telephone Encounter (Signed)
Pt had her flu shot at [facility] in Sept 2016

## 2015-10-12 NOTE — Telephone Encounter (Signed)
Immunization record updated.

## 2015-10-15 DIAGNOSIS — E119 Type 2 diabetes mellitus without complications: Secondary | ICD-10-CM | POA: Diagnosis not present

## 2015-10-31 DIAGNOSIS — H25812 Combined forms of age-related cataract, left eye: Secondary | ICD-10-CM | POA: Diagnosis not present

## 2015-10-31 DIAGNOSIS — H2512 Age-related nuclear cataract, left eye: Secondary | ICD-10-CM | POA: Diagnosis not present

## 2015-11-04 ENCOUNTER — Other Ambulatory Visit: Payer: Self-pay | Admitting: Internal Medicine

## 2015-11-10 DIAGNOSIS — H2511 Age-related nuclear cataract, right eye: Secondary | ICD-10-CM | POA: Diagnosis not present

## 2015-11-19 DIAGNOSIS — H25811 Combined forms of age-related cataract, right eye: Secondary | ICD-10-CM | POA: Diagnosis not present

## 2015-11-19 DIAGNOSIS — H2511 Age-related nuclear cataract, right eye: Secondary | ICD-10-CM | POA: Diagnosis not present

## 2015-11-22 DIAGNOSIS — M1611 Unilateral primary osteoarthritis, right hip: Secondary | ICD-10-CM | POA: Diagnosis not present

## 2015-11-26 ENCOUNTER — Telehealth: Payer: Self-pay | Admitting: Internal Medicine

## 2015-11-26 ENCOUNTER — Ambulatory Visit: Payer: Medicare Other | Admitting: Internal Medicine

## 2015-11-26 NOTE — Telephone Encounter (Signed)
-----   Message from Oneida Alar, [place] sent at 11/26/2015 10:24 AM EST ----- No charge for this patients appointment 11/26/15

## 2015-11-27 ENCOUNTER — Telehealth: Payer: Self-pay | Admitting: *Deleted

## 2015-11-27 NOTE — Telephone Encounter (Signed)
Received Surgical Clearance forms from [place] for patient to have appointment prior to scheduled surgical [Right Anterior Total Hip Arthroplasty] date TBD; forwarded to provider/SLS 01/24 Please call patient and schedule 30-minute Surgical Clearance appointment/SLS Thanks.

## 2015-11-27 NOTE — Telephone Encounter (Signed)
Appt scheduled on 12/07/2015 for surgical clearance.

## 2015-12-07 ENCOUNTER — Telehealth: Payer: Self-pay | Admitting: Internal Medicine

## 2015-12-07 ENCOUNTER — Emergency Department (HOSPITAL_COMMUNITY): Payer: Medicare Other

## 2015-12-07 ENCOUNTER — Ambulatory Visit: Payer: Medicare Other | Admitting: Internal Medicine

## 2015-12-07 ENCOUNTER — Inpatient Hospital Stay (HOSPITAL_COMMUNITY)
Admission: EM | Admit: 2015-12-07 | Discharge: 2015-12-09 | DRG: 389 | Disposition: A | Discharge status: Home / Self Care | Payer: Medicare Other | Attending: Family Medicine | Admitting: Family Medicine

## 2015-12-07 ENCOUNTER — Encounter (HOSPITAL_COMMUNITY): Payer: Self-pay | Admitting: Emergency Medicine

## 2015-12-07 DIAGNOSIS — K5669 Other intestinal obstruction: Secondary | ICD-10-CM | POA: Diagnosis not present

## 2015-12-07 DIAGNOSIS — Z79899 Other long term (current) drug therapy: Secondary | ICD-10-CM

## 2015-12-07 DIAGNOSIS — R112 Nausea with vomiting, unspecified: Secondary | ICD-10-CM | POA: Diagnosis not present

## 2015-12-07 DIAGNOSIS — Z7982 Long term (current) use of aspirin: Secondary | ICD-10-CM

## 2015-12-07 DIAGNOSIS — R109 Unspecified abdominal pain: Secondary | ICD-10-CM | POA: Diagnosis not present

## 2015-12-07 DIAGNOSIS — E785 Hyperlipidemia, unspecified: Secondary | ICD-10-CM | POA: Diagnosis not present

## 2015-12-07 DIAGNOSIS — Z7984 Long term (current) use of oral hypoglycemic drugs: Secondary | ICD-10-CM

## 2015-12-07 DIAGNOSIS — E119 Type 2 diabetes mellitus without complications: Secondary | ICD-10-CM | POA: Diagnosis not present

## 2015-12-07 DIAGNOSIS — R1084 Generalized abdominal pain: Secondary | ICD-10-CM | POA: Diagnosis not present

## 2015-12-07 DIAGNOSIS — K565 Intestinal adhesions [bands] with obstruction (postprocedural) (postinfection): Principal | ICD-10-CM | POA: Diagnosis present

## 2015-12-07 DIAGNOSIS — K5660 Unspecified intestinal obstruction: Secondary | ICD-10-CM | POA: Diagnosis not present

## 2015-12-07 DIAGNOSIS — N390 Urinary tract infection, site not specified: Secondary | ICD-10-CM | POA: Diagnosis present

## 2015-12-07 DIAGNOSIS — K56609 Unspecified intestinal obstruction, unspecified as to partial versus complete obstruction: Secondary | ICD-10-CM | POA: Diagnosis present

## 2015-12-07 DIAGNOSIS — Z96651 Presence of right artificial knee joint: Secondary | ICD-10-CM | POA: Diagnosis present

## 2015-12-07 LAB — TYPE AND SCREEN
ABO/RH(D): A POS
Antibody Screen: NEGATIVE

## 2015-12-07 LAB — COMPREHENSIVE METABOLIC PANEL
ALBUMIN: 4.1 g/dL (ref 3.5–5.0)
ALK PHOS: 56 U/L (ref 38–126)
ALT: 14 U/L (ref 14–54)
AST: 19 U/L (ref 15–41)
Anion gap: 12 (ref 5–15)
BILIRUBIN TOTAL: 0.5 mg/dL (ref 0.3–1.2)
BUN: 16 mg/dL (ref 6–20)
CALCIUM: 9.7 mg/dL (ref 8.9–10.3)
CO2: 26 mmol/L (ref 22–32)
CREATININE: 0.87 mg/dL (ref 0.44–1.00)
Chloride: 101 mmol/L (ref 101–111)
GFR calc non Af Amer: 60 mL/min — ABNORMAL LOW (ref >60)
GLUCOSE: 178 mg/dL — AB (ref 65–99)
Potassium: 4.3 mmol/L (ref 3.5–5.1)
SODIUM: 139 mmol/L (ref 135–145)
Total Protein: 7.5 g/dL (ref 6.5–8.1)

## 2015-12-07 LAB — CBC WITH DIFFERENTIAL/PLATELET
BASOS PCT: 0 %
Basophils Absolute: 0 10*3/uL (ref 0.0–0.1)
EOS ABS: 0 10*3/uL (ref 0.0–0.7)
EOS PCT: 0 %
HCT: 36.9 % (ref 36.0–46.0)
Hemoglobin: 12.3 g/dL (ref 12.0–15.0)
LYMPHS ABS: 1.6 10*3/uL (ref 0.7–4.0)
Lymphocytes Relative: 16 %
MCH: 27.8 pg (ref 26.0–34.0)
MCHC: 33.3 g/dL (ref 30.0–36.0)
MCV: 83.5 fL (ref 78.0–100.0)
MONO ABS: 0.3 10*3/uL (ref 0.1–1.0)
MONOS PCT: 3 %
Neutro Abs: 8.1 10*3/uL — ABNORMAL HIGH (ref 1.7–7.7)
Neutrophils Relative %: 81 %
Platelets: 244 10*3/uL (ref 150–400)
RBC: 4.42 MIL/uL (ref 3.87–5.11)
RDW: 13.6 % (ref 11.5–15.5)
WBC: 10 10*3/uL (ref 4.0–10.5)

## 2015-12-07 LAB — URINALYSIS, ROUTINE W REFLEX MICROSCOPIC
Bilirubin Urine: NEGATIVE
GLUCOSE, UA: NEGATIVE mg/dL
HGB URINE DIPSTICK: NEGATIVE
Ketones, ur: 15 mg/dL — AB
Nitrite: POSITIVE — AB
Protein, ur: NEGATIVE mg/dL
SPECIFIC GRAVITY, URINE: 1.026 (ref 1.005–1.030)
pH: 7 (ref 5.0–8.0)

## 2015-12-07 LAB — CBC
HEMATOCRIT: 37.6 % (ref 36.0–46.0)
Hemoglobin: 12.2 g/dL (ref 12.0–15.0)
MCH: 27.3 pg (ref 26.0–34.0)
MCHC: 32.4 g/dL (ref 30.0–36.0)
MCV: 84.1 fL (ref 78.0–100.0)
Platelets: 220 10*3/uL (ref 150–400)
RBC: 4.47 MIL/uL (ref 3.87–5.11)
RDW: 13.6 % (ref 11.5–15.5)
WBC: 9.3 10*3/uL (ref 4.0–10.5)

## 2015-12-07 LAB — BASIC METABOLIC PANEL
Anion gap: 8 (ref 5–15)
BUN: 13 mg/dL (ref 6–20)
CO2: 29 mmol/L (ref 22–32)
Calcium: 9.4 mg/dL (ref 8.9–10.3)
Chloride: 103 mmol/L (ref 101–111)
Creatinine, Ser: 0.79 mg/dL (ref 0.44–1.00)
GFR calc Af Amer: >60 mL/min (ref >60)
GLUCOSE: 152 mg/dL — AB (ref 65–99)
POTASSIUM: 4.6 mmol/L (ref 3.5–5.1)
Sodium: 140 mmol/L (ref 135–145)

## 2015-12-07 LAB — I-STAT CG4 LACTIC ACID, ED: Lactic Acid, Venous: 1.81 mmol/L (ref 0.5–2.0)

## 2015-12-07 LAB — URINE MICROSCOPIC-ADD ON
RBC / HPF: NONE SEEN RBC/hpf (ref 0–5)
WBC, UA: NONE SEEN WBC/hpf (ref 0–5)

## 2015-12-07 LAB — LIPASE, BLOOD: Lipase: 26 U/L (ref 11–51)

## 2015-12-07 LAB — BRAIN NATRIURETIC PEPTIDE: B NATRIURETIC PEPTIDE 5: 12 pg/mL (ref 0.0–100.0)

## 2015-12-07 LAB — GLUCOSE, CAPILLARY
GLUCOSE-CAPILLARY: 100 mg/dL — AB (ref 65–99)
GLUCOSE-CAPILLARY: 80 mg/dL (ref 65–99)
Glucose-Capillary: 139 mg/dL — ABNORMAL HIGH (ref 65–99)
Glucose-Capillary: 95 mg/dL (ref 65–99)
Glucose-Capillary: 107 mg/dL — ABNORMAL HIGH (ref 65–99)

## 2015-12-07 LAB — PROTIME-INR
INR: 1.09 (ref 0.00–1.49)
PROTHROMBIN TIME: 14.3 s (ref 11.6–15.2)

## 2015-12-07 LAB — APTT: APTT: 30 s (ref 24–37)

## 2015-12-07 MED ORDER — MORPHINE SULFATE (PF) 4 MG/ML IV SOLN
6.0000 mg | Freq: Once | INTRAVENOUS | Status: AC
  Administered 2015-12-07: 6 mg via INTRAVENOUS
  Filled 2015-12-07: qty 2

## 2015-12-07 MED ORDER — PRAVASTATIN SODIUM 20 MG PO TABS
10.0000 mg | ORAL_TABLET | Freq: Every day | ORAL | Status: DC
  Administered 2015-12-08: 10 mg via ORAL
  Filled 2015-12-07 (×3): qty 1

## 2015-12-07 MED ORDER — ONDANSETRON HCL 4 MG/2ML IJ SOLN: 4.0000 mg | Freq: Three times a day (TID) | INTRAMUSCULAR | Status: DC | PRN

## 2015-12-07 MED ORDER — ACETAMINOPHEN 650 MG RE SUPP: 650.0000 mg | Freq: Four times a day (QID) | RECTAL | Status: DC | PRN

## 2015-12-07 MED ORDER — IOHEXOL 300 MG/ML  SOLN
100.0000 mL | Freq: Once | INTRAMUSCULAR | Status: AC | PRN
  Administered 2015-12-07: 100 mL via INTRAVENOUS

## 2015-12-07 MED ORDER — IOHEXOL 300 MG/ML  SOLN: 25.0000 mL | Freq: Once | INTRAMUSCULAR | Status: DC | PRN

## 2015-12-07 MED ORDER — PREDNISOLONE ACETATE 1 % OP SUSP
1.0000 [drp] | Freq: Four times a day (QID) | OPHTHALMIC | Status: DC
  Administered 2015-12-07 – 2015-12-09 (×9): 1 [drp] via OPHTHALMIC
  Filled 2015-12-07: qty 1

## 2015-12-07 MED ORDER — ASPIRIN EC 81 MG PO TBEC
81.0000 mg | DELAYED_RELEASE_TABLET | Freq: Every day | ORAL | Status: DC
  Administered 2015-12-07 – 2015-12-09 (×2): 81 mg via ORAL
  Filled 2015-12-07 (×2): qty 1

## 2015-12-07 MED ORDER — CEFTRIAXONE SODIUM 1 G IJ SOLR
1.0000 g | INTRAMUSCULAR | Status: DC
  Administered 2015-12-07 – 2015-12-09 (×3): 1 g via INTRAVENOUS
  Filled 2015-12-07 (×4): qty 10

## 2015-12-07 MED ORDER — SODIUM CHLORIDE 0.9 % IV SOLN
INTRAVENOUS | Status: DC
  Administered 2015-12-07: 05:00:00 via INTRAVENOUS

## 2015-12-07 MED ORDER — ONDANSETRON HCL 4 MG/2ML IJ SOLN
4.0000 mg | Freq: Once | INTRAMUSCULAR | Status: AC
  Administered 2015-12-07: 4 mg via INTRAVENOUS
  Filled 2015-12-07: qty 2

## 2015-12-07 MED ORDER — INSULIN ASPART 100 UNIT/ML ~~LOC~~ SOLN: 0.0000 [IU] | Freq: Three times a day (TID) | SUBCUTANEOUS | Status: DC

## 2015-12-07 MED ORDER — SODIUM CHLORIDE 0.9 % IV BOLUS (SEPSIS)
1000.0000 mL | Freq: Once | INTRAVENOUS | Status: AC
  Administered 2015-12-07: 1000 mL via INTRAVENOUS

## 2015-12-07 MED ORDER — KCL IN DEXTROSE-NACL 20-5-0.45 MEQ/L-%-% IV SOLN
INTRAVENOUS | Status: DC
  Administered 2015-12-07 – 2015-12-09 (×4): via INTRAVENOUS
  Filled 2015-12-07 (×6): qty 1000

## 2015-12-07 MED ORDER — MORPHINE SULFATE (PF) 2 MG/ML IV SOLN
2.0000 mg | INTRAVENOUS | Status: DC | PRN
  Administered 2015-12-08: 2 mg via INTRAVENOUS
  Filled 2015-12-07: qty 1

## 2015-12-07 MED ORDER — ACETAMINOPHEN 325 MG PO TABS
650.0000 mg | ORAL_TABLET | Freq: Four times a day (QID) | ORAL | Status: DC | PRN
  Filled 2015-12-07: qty 2

## 2015-12-07 NOTE — Consult Note (Addendum)
Reason for Consult:SBO Referring Physician: Tytionna Taylor is an 82 y.o. female.  HPI: [name] has a history of previous small bowel obstructions. She has been followed by our practice before. She underwent lysis of adhesions in 2012 for this and that was her second surgery for obstruction. She was admitted in 2015 and managed nonoperatively. Last night after dinner, she felt bloated and had crampy abdominal pain. She took Gas-X but the discomfort continued. She then vomited 4 or 5 times. A friend brought her to the emergency department. Workup included laboratory studies which were unremarkable with the exception of glucose of 152. Lactate and white blood cell count are normal. CT scan of the abdomen and pelvis does demonstrate small bowel obstruction with some edema in the mesentery. I was asked to see her in consultation from a surgical standpoint for management.  Past Medical History  Diagnosis Date  . Osteopenia   . Abnormal breast exam 2004    MMG neg (eval by surgeon neg)  . Hx SBO 2004 , 2007, 2012     exploratory surgery 9-12   . Iritis 2005  . Type II diabetes mellitus ([place])   . Anemia   . History of blood transfusion     "related to a surgery, I think"  . Osteoarthritis   . Arthritis     "knees" (07/11/2014)  . SBO (small bowel obstruction) ([place]) 07-2014    Past Surgical History  Procedure Laterality Date  . Total knee arthroplasty Right 10/2010  . Exploratory laparotomy  07/2011    SBO  . Appendectomy    . Total abdominal hysterectomy  1970's    for bleeding, no cancer  . Dilation and curettage of uterus      Family History  Problem Relation Age of Onset  . Diabetes Father   . Heart attack Neg Hx   . Breast cancer Sister   . Sudden death Sister   . Colon cancer Neg Hx     Social History:  reports that she has never smoked. She has never used smokeless tobacco. She reports that she does not drink alcohol or use illicit drugs.  Allergies:  Allergies   Allergen Reactions  . Zocor [Simvastatin]     Ear ringing  . Lipitor [Atorvastatin]     tinnitus    Medications:  Scheduled: . aspirin EC  81 mg Oral Daily  . cefTRIAXone (ROCEPHIN)  IV  1 g Intravenous Q24H  . insulin aspart  0-9 Units Subcutaneous TID WC  . pravastatin  10 mg Oral QHS   Continuous: . sodium chloride 100 mL/hr at 12/07/15 0456   CNO:[id number] **OR** acetaminophen, iohexol, morphine injection, ondansetron (ZOFRAN) IV  Results for orders placed or performed during the hospital encounter of 12/07/15 (from the past 48 hour(s))  CBC with Differential/Platelet     Status: Abnormal   Collection Time: 12/07/15  2:00 AM  Result Value Ref Range   WBC 10.0 4.0 - 10.5 K/uL   RBC 4.42 3.87 - 5.11 MIL/uL   Hemoglobin 12.3 12.0 - 15.0 g/dL   HCT 36.9 36.0 - 46.0 %   MCV 83.5 78.0 - 100.0 fL   MCH 27.8 26.0 - 34.0 pg   MCHC 33.3 30.0 - 36.0 g/dL   RDW 13.6 11.5 - 15.5 %   Platelets 244 150 - 400 K/uL   Neutrophils Relative % 81 %   Neutro Abs 8.1 (H) 1.7 - 7.7 K/uL   Lymphocytes Relative 16 %  Lymphs Abs 1.6 0.7 - 4.0 K/uL   Monocytes Relative 3 %   Monocytes Absolute 0.3 0.1 - 1.0 K/uL   Eosinophils Relative 0 %   Eosinophils Absolute 0.0 0.0 - 0.7 K/uL   Basophils Relative 0 %   Basophils Absolute 0.0 0.0 - 0.1 K/uL  Comprehensive metabolic panel     Status: Abnormal   Collection Time: 12/07/15  2:00 AM  Result Value Ref Range   Sodium 139 135 - 145 mmol/L   Potassium 4.3 3.5 - 5.1 mmol/L   Chloride 101 101 - 111 mmol/L   CO2 26 22 - 32 mmol/L   Glucose, Bld 178 (H) 65 - 99 mg/dL   BUN 16 6 - 20 mg/dL   Creatinine, Ser 0.87 0.44 - 1.00 mg/dL   Calcium 9.7 8.9 - 10.3 mg/dL   Total Protein 7.5 6.5 - 8.1 g/dL   Albumin 4.1 3.5 - 5.0 g/dL   AST 19 15 - 41 U/L   ALT 14 14 - 54 U/L   Alkaline Phosphatase 56 38 - 126 U/L   Total Bilirubin 0.5 0.3 - 1.2 mg/dL   GFR calc non Af Amer 60 (L) >60 mL/min   GFR calc Af Amer >60 >60 mL/min    Comment:  (NOTE) The eGFR has been calculated using the CKD EPI equation. This calculation has not been validated in all clinical situations. eGFR's persistently <60 mL/min signify possible Chronic Kidney Disease.    Anion gap 12 5 - 15  Lipase, blood     Status: None   Collection Time: 12/07/15  2:00 AM  Result Value Ref Range   Lipase 26 11 - 51 U/L  I-Stat CG4 Lactic Acid, ED     Status: None   Collection Time: 12/07/15  2:47 AM  Result Value Ref Range   Lactic Acid, Venous 1.81 0.5 - 2.0 mmol/L  Urinalysis, Routine w reflex microscopic (not at [facility])     Status: Abnormal   Collection Time: 12/07/15  4:05 AM  Result Value Ref Range   Color, Urine YELLOW YELLOW   APPearance CLOUDY (A) CLEAR   Specific Gravity, Urine 1.026 1.005 - 1.030   pH 7.0 5.0 - 8.0   Glucose, UA NEGATIVE NEGATIVE mg/dL   Hgb urine dipstick NEGATIVE NEGATIVE   Bilirubin Urine NEGATIVE NEGATIVE   Ketones, ur 15 (A) NEGATIVE mg/dL   Protein, ur NEGATIVE NEGATIVE mg/dL   Nitrite POSITIVE (A) NEGATIVE   Leukocytes, UA TRACE (A) NEGATIVE  Urine microscopic-add on     Status: Abnormal   Collection Time: 12/07/15  4:05 AM  Result Value Ref Range   Squamous Epithelial / LPF 0-5 (A) NONE SEEN   WBC, UA NONE SEEN 0 - 5 WBC/hpf   RBC / HPF NONE SEEN 0 - 5 RBC/hpf   Bacteria, UA MANY (A) NONE SEEN  Type and screen [facility]     Status: None   Collection Time: 12/07/15  4:41 AM  Result Value Ref Range   ABO/RH(D) A POS    Antibody Screen NEG    Sample Expiration 12/10/2015   Protime-INR     Status: None   Collection Time: 12/07/15  5:10 AM  Result Value Ref Range   Prothrombin Time 14.3 11.6 - 15.2 seconds   INR 1.09 0.00 - 1.49  APTT     Status: None   Collection Time: 12/07/15  5:10 AM  Result Value Ref Range   aPTT 30 24 - 37 seconds  Brain natriuretic peptide     Status: None   Collection Time: 12/07/15  5:10 AM  Result Value Ref Range   B Natriuretic Peptide 12.0 0.0 - 100.0 pg/mL  Basic  metabolic panel     Status: Abnormal   Collection Time: 12/07/15  5:10 AM  Result Value Ref Range   Sodium 140 135 - 145 mmol/L   Potassium 4.6 3.5 - 5.1 mmol/L   Chloride 103 101 - 111 mmol/L   CO2 29 22 - 32 mmol/L   Glucose, Bld 152 (H) 65 - 99 mg/dL   BUN 13 6 - 20 mg/dL   Creatinine, Ser 0.79 0.44 - 1.00 mg/dL   Calcium 9.4 8.9 - 10.3 mg/dL   GFR calc non Af Amer >60 >60 mL/min   GFR calc Af Amer >60 >60 mL/min    Comment: (NOTE) The eGFR has been calculated using the CKD EPI equation. This calculation has not been validated in all clinical situations. eGFR's persistently <60 mL/min signify possible Chronic Kidney Disease.    Anion gap 8 5 - 15  CBC     Status: None   Collection Time: 12/07/15  5:10 AM  Result Value Ref Range   WBC 9.3 4.0 - 10.5 K/uL   RBC 4.47 3.87 - 5.11 MIL/uL   Hemoglobin 12.2 12.0 - 15.0 g/dL   HCT 37.6 36.0 - 46.0 %   MCV 84.1 78.0 - 100.0 fL   MCH 27.3 26.0 - 34.0 pg   MCHC 32.4 30.0 - 36.0 g/dL   RDW 13.6 11.5 - 15.5 %   Platelets 220 150 - 400 K/uL  Glucose, capillary     Status: Abnormal   Collection Time: 12/07/15  5:36 AM  Result Value Ref Range   Glucose-Capillary 139 (H) 65 - 99 mg/dL    Ct Abdomen Pelvis W Contrast  12/07/2015  CLINICAL DATA:  Abdominal pain and small bowel obstruction. EXAM: CT ABDOMEN AND PELVIS WITH CONTRAST TECHNIQUE: Multidetector CT imaging of the abdomen and pelvis was performed using the standard protocol following bolus administration of intravenous contrast. CONTRAST:  135m OMNIPAQUE IOHEXOL 300 MG/ML  SOLN COMPARISON:  07/11/2014 FINDINGS: Lower chest and abdominal wall:  No contributory findings. Hepatobiliary: No focal liver abnormality.Distended gallbladder without superimposed inflammation. Dependent high-density material could reflect sludge or faintly calcified calculi. Pancreas: Unremarkable. Spleen: Unremarkable. Adrenals/Urinary Tract: Stable benign lobulation of the left adrenal. No hydronephrosis or  stone. Unremarkable bladder. Reproductive:Hysterectomy with negative adnexae. Stomach/Bowel: Dilated bowel with fluid levels in the right lower quadrant, with prominent mesenteric edema. Proximal jejunal loops are not dilated, and ileal loops are decompressed, initially concerning for closed loop obstruction but only single transition point is identified and oral contrast opacifies the maximally dilated loops in the right lower quadrant. Bowel in the right lower quadrant is distorted/angulated, as seen adhesive disease. The mesenteric edema is clustered in the right lower quadrant but there is no clear saclike morphology or mesenteric vessel distortion typical of internal hernia. No pneumatosis or perforation. Sigmoid predominant diverticulosis. Appendectomy. Vascular/Lymphatic: No acute vascular abnormality. No mass or adenopathy. Peritoneal: No ascites or pneumoperitoneum. Musculoskeletal: No acute abnormalities. IMPRESSION: 1. High-grade mid small bowel obstruction, likely from adhesions. The same pattern of obstruction was seen in 2015 but mesenteric edema/congestion is greater today. 2. Gallstones versus sludge. Electronically Signed   By: JMonte FantasiaM.D.   On: 12/07/2015 03:50    Review of Systems  Constitutional: Negative for fever.  Eyes: Negative for blurred vision.  Respiratory: Negative  for cough.   Cardiovascular: Negative for chest pain.  Gastrointestinal: Positive for nausea, vomiting and abdominal pain.  Genitourinary: Negative.   Musculoskeletal: Negative.   Skin: Negative for rash.  Neurological: Negative.  Negative for headaches.  Endo/Heme/Allergies: Negative.   Psychiatric/Behavioral: Negative.    Blood pressure 130/60, pulse 80, temperature 98.5 F (36.9 C), temperature source Oral, resp. rate 16, height 5' 5"  (1.651 m), weight 85.548 kg (188 lb 9.6 oz), SpO2 99 %. Physical Exam  Constitutional: She is oriented to person, place, and time. She appears well-developed and  well-nourished. No distress.  HENT:  Head: Normocephalic and atraumatic.  Right Ear: External ear normal.  Left Ear: External ear normal.  Nose: Nose normal.  Mouth/Throat: Oropharynx is clear and moist.  Eyes: EOM are normal. Pupils are equal, round, and reactive to light. Right eye exhibits no discharge. Left eye exhibits no discharge.  Neck: Neck supple. No tracheal deviation present.  Cardiovascular: Normal rate, regular rhythm and normal heart sounds.   Respiratory: Effort normal and breath sounds normal. No stridor. No respiratory distress. She has no wheezes. She has no rales.  GI: Soft. She exhibits distension. There is tenderness. There is no rebound and no guarding.  Mild distention, midline scar, minimal tenderness right lower quadrant and right mid abdomen, hypoactive bowel sounds, no generalized tenderness  Musculoskeletal: Normal range of motion.  Neurological: She is alert and oriented to person, place, and time. She exhibits normal muscle tone.  Skin: Skin is warm.  Psychiatric: She has a normal mood and affect.    Assessment/Plan: Small bowel obstruction, likely due to adhesions. Exam is not concerning and laboratory studies are within normal limits. We will proceed with the small bowel protocol and follow along closely. If she vomits would place NGT.  If she does not resolve, she may need exploratory laparotomy.  Quintrell Baze E 12/07/2015, 6:39 AM

## 2015-12-07 NOTE — ED Notes (Signed)
Patient reports having stomach pain around 8-9pm last night, that later led to vomiting. She reports taking 2 gas-ex without relief.

## 2015-12-07 NOTE — Telephone Encounter (Signed)
Daughter Manuela Schwartz) called stating that patient is hospitalized. She has a SBO and daughter is hoping that they will do her surgery while there. 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 Appt for today was not rescheduled at this time

## 2015-12-07 NOTE — Telephone Encounter (Signed)
Pt called stating she is returning a call to our office. She is still in the hospital. Please call her cell phone#.

## 2015-12-07 NOTE — ED Notes (Signed)
Reported request for pain meds, MD gives verbal order for repeat morphine at 6mg .

## 2015-12-07 NOTE — Progress Notes (Signed)
Patient seen and evaluated earlier this a.m. but my associate. Please refer to H&P for details regarding assessment and plan  We'll place on maintenance IV fluids  Gen: pt in nad, alert and awake CV: s1 and s2 wnl, no rubs Pulm: CTA BL, no wheezes ABdomen: no rebound tenderness, soft, ND  Will reassess next am  Bonnee Zertuche, Linward Foster

## 2015-12-07 NOTE — Progress Notes (Signed)
Patient ID: [name], female   DOB: 01/24/1930, 82 y.o.   MRN: [id number]  S: Passing flatus.  No n/v.  C/o tenderness to RLQ.  O: abdomen is soft and non distended, exhibits tenderness to RLQ without guarding.  A/P: AXR in AM to evaluate progression of oral contrast.  No NGT at this time, place if n/v develops.  Josephyne Tarter, ANP-BC

## 2015-12-07 NOTE — Telephone Encounter (Signed)
Spoke with Dr. Larose Kells, we have not tried calling Pt. Unsure if someone up front did or not.

## 2015-12-07 NOTE — H&P (Signed)
[facility] History and Physical  [name] [id number] DOB: [birth year] DOA: 12/07/2015  Referring physician: ED physician PCP: Kathlene November, MD  Specialists:   Chief Complaint: Nausea, vomiting, abdominal pain  HPI: [name] is a 82 y.o. female with PMH of small bowel obstruction, hyperlipidemia, diabetes mellitus, osteoarthritis, iritis, who presents with nausea, vomiting and abdominal pain.  Patient reports that she started having nausea, vomiting and abdominal pain at about 8 PM. She vomited 5-6 times without blood in the vomitus. Her last bowel movement was in the evening. Her abdominal pain is located in the central abdomen, constant, 10 out of 10 in severity, nonradiating. Patient does not have fever, chills, chest pain, shortness breath, cough, unilateral weakness. She states that she has chronic urinary frequency, but no dysuria or burning on urination.  In ED, patient was found to have lipase 26, lactate 1.81, urinalysis with trace amount of leukocytes and positive nitrates, electrolytes and renal function okay. CT abdomen/pelvis showed high-grade small bowel obstruction. Patient admitted to inpatient for further evaluation and treatment. General surgery was consulted.  EKG:  Not done in ED, will get one.   Where does patient live?   At home    Can patient participate in ADLs?  Some   Review of Systems:   General: no fevers, chills, no changes in body weight, has poor appetite, has fatigue HEENT: no blurry vision, hearing changes or sore throat Pulm: no dyspnea, coughing, wheezing CV: no chest pain, palpitations Abd: has nausea, vomiting, abdominal pain, no diarrhea, constipation GU: no dysuria, burning on urination, has increased urinary frequency, no hematuria  Ext: no leg edema Neuro: no unilateral weakness, numbness, or tingling, no vision change or hearing loss Skin: no rash MSK: No muscle spasm, no deformity, no limitation of range of movement in  spin Heme: No easy bruising.  Travel history: No recent long distant travel.  Allergy:  Allergies  Allergen Reactions  . Zocor [Simvastatin]     Ear ringing  . Lipitor [Atorvastatin]     tinnitus    Past Medical History  Diagnosis Date  . Osteopenia   . Abnormal breast exam 2004    MMG neg (eval by surgeon neg)  . Hx SBO 2004 , 2007, 2012     exploratory surgery 9-12   . Iritis 2005  . Type II diabetes mellitus ([place])   . Anemia   . History of blood transfusion     "related to a surgery, I think"  . Osteoarthritis   . Arthritis     "knees" (07/11/2014)  . SBO (small bowel obstruction) ([place]) 07-2014    Past Surgical History  Procedure Laterality Date  . Total knee arthroplasty Right 10/2010  . Exploratory laparotomy  07/2011    SBO  . Appendectomy    . Total abdominal hysterectomy  1970's    for bleeding, no cancer  . Dilation and curettage of uterus      Social History:  reports that she has never smoked. She has never used smokeless tobacco. She reports that she does not drink alcohol or use illicit drugs.  Family History:  Family History  Problem Relation Age of Onset  . Diabetes Father   . Heart attack Neg Hx   . Breast cancer Sister   . Sudden death Sister   . Colon cancer Neg Hx      Prior to Admission medications   Medication Sig Start Date End Date Taking? Authorizing Provider  aspirin 81 MG tablet  Take 81 mg by mouth daily.      Historical Provider, MD  metFORMIN (GLUCOPHAGE) 500 MG tablet Take 1 tablet (500 mg total) by mouth 2 (two) times daily with a meal. 05/24/15   Colon Branch, MD  Multiple Vitamins-Minerals (CENTRUM SILVER PO) Take 1 capsule by mouth daily.      Historical Provider, MD  pravastatin (PRAVACHOL) 10 MG tablet Take 1 tablet (10 mg total) by mouth at bedtime. 11/06/15   Colon Branch, MD    Physical Exam: Filed Vitals:   12/07/15 0430 12/07/15 0445 12/07/15 0500 12/07/15 0532  BP: 148/85 149/82 131/81 130/60  Pulse: 80 79 84 80  Temp:     98.5 F (36.9 C)  TempSrc:    Oral  Resp: 13 13 10 16   Height:      Weight:    85.548 kg (188 lb 9.6 oz)  SpO2: 95% 95% 98% 99%   General: Not in acute distress HEENT:       Eyes: PERRL, EOMI, no scleral icterus.       ENT: No discharge from the ears and nose, no pharynx injection, no tonsillar enlargement.        Neck: No JVD, no bruit, no mass felt. Heme: No neck lymph node enlargement. Cardiac: S1/S2, RRR, No murmurs, No gallops or rubs. Pulm: No rales, wheezing, rhonchi or rubs. Abd: Soft, mildly distended, tender over central and lower abdomen, no rebound pain, no organomegaly, BS present. Ext: No pitting leg edema bilaterally. 2+DP/PT pulse bilaterally. Musculoskeletal: No joint deformities, No joint redness or warmth, no limitation of ROM in spin. Skin: No rashes.  Neuro: Alert, oriented X3, cranial nerves II-XII grossly intact, moves all extremities. Psych: Patient is not psychotic, no suicidal or hemocidal ideation.  Labs on Admission:  Basic Metabolic Panel:  Recent Labs Lab 12/07/15 0200 12/07/15 0510  NA 139 140  K 4.3 4.6  CL 101 103  CO2 26 29  GLUCOSE 178* 152*  BUN 16 13  CREATININE 0.87 0.79  CALCIUM 9.7 9.4   Liver Function Tests:  Recent Labs Lab 12/07/15 0200  AST 19  ALT 14  ALKPHOS 56  BILITOT 0.5  PROT 7.5  ALBUMIN 4.1    Recent Labs Lab 12/07/15 0200  LIPASE 26   No results for input(s): AMMONIA in the last 168 hours. CBC:  Recent Labs Lab 12/07/15 0200 12/07/15 0510  WBC 10.0 9.3  NEUTROABS 8.1*  --   HGB 12.3 12.2  HCT 36.9 37.6  MCV 83.5 84.1  PLT 244 220   Cardiac Enzymes: No results for input(s): CKTOTAL, CKMB, CKMBINDEX, TROPONINI in the last 168 hours.  BNP (last 3 results) No results for input(s): BNP in the last 8760 hours.  ProBNP (last 3 results) No results for input(s): PROBNP in the last 8760 hours.  CBG:  Recent Labs Lab 12/07/15 0536  GLUCAP 139*    Radiological Exams on Admission: Ct  Abdomen Pelvis W Contrast  12/07/2015  CLINICAL DATA:  Abdominal pain and small bowel obstruction. EXAM: CT ABDOMEN AND PELVIS WITH CONTRAST TECHNIQUE: Multidetector CT imaging of the abdomen and pelvis was performed using the standard protocol following bolus administration of intravenous contrast. CONTRAST:  161mL OMNIPAQUE IOHEXOL 300 MG/ML  SOLN COMPARISON:  07/11/2014 FINDINGS: Lower chest and abdominal wall:  No contributory findings. Hepatobiliary: No focal liver abnormality.Distended gallbladder without superimposed inflammation. Dependent high-density material could reflect sludge or faintly calcified calculi. Pancreas: Unremarkable. Spleen: Unremarkable. Adrenals/Urinary Tract: Stable benign lobulation of  the left adrenal. No hydronephrosis or stone. Unremarkable bladder. Reproductive:Hysterectomy with negative adnexae. Stomach/Bowel: Dilated bowel with fluid levels in the right lower quadrant, with prominent mesenteric edema. Proximal jejunal loops are not dilated, and ileal loops are decompressed, initially concerning for closed loop obstruction but only single transition point is identified and oral contrast opacifies the maximally dilated loops in the right lower quadrant. Bowel in the right lower quadrant is distorted/angulated, as seen adhesive disease. The mesenteric edema is clustered in the right lower quadrant but there is no clear saclike morphology or mesenteric vessel distortion typical of internal hernia. No pneumatosis or perforation. Sigmoid predominant diverticulosis. Appendectomy. Vascular/Lymphatic: No acute vascular abnormality. No mass or adenopathy. Peritoneal: No ascites or pneumoperitoneum. Musculoskeletal: No acute abnormalities. IMPRESSION: 1. High-grade mid small bowel obstruction, likely from adhesions. The same pattern of obstruction was seen in 2015 but mesenteric edema/congestion is greater today. 2. Gallstones versus sludge. Electronically Signed   By: Monte Fantasia M.D.    On: 12/07/2015 03:50    Assessment/Plan Principal Problem:   Small bowel obstruction (HCC) Active Problems:   Dyslipidemia   Diabetes mellitus without complication (HCC)   UTI (lower urinary tract infection)   SBO (small bowel obstruction) (HCC)   Small bowel obstruction ([place]): Patient's nausea, vomiting and abdominal pain are most likely caused by small bowel obstruction as evidenced by the CT abdomen/pelvis. This is likely due to adhesion. Patient is not septic on admission. Hemodynamically stable. General surgeon, Dr. Grandville Silos was consulted by EDP.  -will admit to med-surg bed -When necessary NG tube -NPO  -morphine prn pain -Prn Zofran prn nausea  -IVF: 1L NS and then 100 cc/h -INR/PTT/type & screen -Follow-up general surgeons recommendation  Possible UTI: Patient has positive urinalysis with increased urinary frequency. -Start Rocephin -Follow-up blood culture and urine culture  DM-II: Last A1c 6.4 on 05/22/15,  well controled. Patient is taking metformin at home -SSI  HLD: Last LDL was 128 on 05/22/15 -Continue home medications: Pravastatin   DVT ppx: SCD  Code Status: Full code Family Communication:   Yes, patient's friend at bed side Disposition Plan: Admit to inpatient   Date of Service 12/07/2015    Ivor Costa [facility] Pager [phone]  If 7PM-7AM, please contact night-coverage [email or web address] Password [email or web address] 12/07/2015, 6:18 AM

## 2015-12-07 NOTE — Progress Notes (Signed)
OT Cancellation Note  Patient Details Name: [name] MRN: [id number] DOB: 04/02/1930   Cancelled Treatment:    Reason Eval/Treat Not Completed: OT screened, spoke to PT, no OT needs identified, will sign off  Kaivon Livesey A 12/07/2015, 11:42 AM

## 2015-12-07 NOTE — Telephone Encounter (Signed)
Noted, no charge.  

## 2015-12-07 NOTE — ED Provider Notes (Signed)
CSN: [id number]     Arrival date & time 12/07/15  0108 History  By signing my name below, I, Meriel Pica, attest that this documentation has been prepared under the direction and in the presence of Everlene Balls, MD. Electronically Signed: Meriel Pica, ED Scribe. 12/07/2015. 1:42 AM.   Chief Complaint  Patient presents with  . Abdominal Pain   The history is provided by the patient. No language interpreter was used.   HPI Comments: [name] is a 82 y.o. female, with a h/o SBO, who presents to the Emergency Department complaining of sudden onset, constant, severe, cramping periumbilical abdominal pain with associated nausea and vomiting onset 3 hours ago. Pt reports she has 'gas pains' that are characteristic of her past pain experienced with SBO. The pt has a prior h/o 3 SBO with her last exploratory laparatomy being preformed 2 years ago by Dr. Rush Farmer. She notes her last BM was earlier today with 1 episode of abnormal BM during abdominal cramping. PShx of exploratory laparotomy, appendectomy, and total abdominal hysterectomy. No known sick contacts.   Past Medical History  Diagnosis Date  . Osteopenia   . Abnormal breast exam 2004    MMG neg (eval by surgeon neg)  . Hx SBO 2004 , 2007, 2012     exploratory surgery 9-12   . Iritis 2005  . Type II diabetes mellitus ([place])   . Anemia   . History of blood transfusion     "related to a surgery, I think"  . Osteoarthritis   . Arthritis     "knees" (07/11/2014)  . SBO (small bowel obstruction) ([place]) 07-2014   Past Surgical History  Procedure Laterality Date  . Total knee arthroplasty Right 10/2010  . Exploratory laparotomy  07/2011    SBO  . Appendectomy    . Total abdominal hysterectomy  1970's    for bleeding, no cancer  . Dilation and curettage of uterus     Family History  Problem Relation Age of Onset  . Diabetes Father   . Heart attack Neg Hx   . Breast cancer Sister   . Sudden death Sister   . Colon cancer Neg  Hx    Social History  Substance Use Topics  . Smoking status: Never Smoker   . Smokeless tobacco: Never Used  . Alcohol Use: No   OB History    No data available     Review of Systems  Constitutional: Negative for fever.  Gastrointestinal: Positive for nausea, vomiting and abdominal pain.   A complete 10 system review of systems was obtained and is otherwise negative except at noted in the HPI and PMH.  Allergies  Zocor and Lipitor  Home Medications   Prior to Admission medications   Medication Sig Start Date End Date Taking? Authorizing Provider  aspirin 81 MG tablet Take 81 mg by mouth daily.      Historical Provider, MD  metFORMIN (GLUCOPHAGE) 500 MG tablet Take 1 tablet (500 mg total) by mouth 2 (two) times daily with a meal. 05/24/15   Colon Branch, MD  Multiple Vitamins-Minerals (CENTRUM SILVER PO) Take 1 capsule by mouth daily.      Historical Provider, MD  pravastatin (PRAVACHOL) 10 MG tablet Take 1 tablet (10 mg total) by mouth at bedtime. 11/06/15   Colon Branch, MD   BP 158/84 mmHg  Pulse 85  Temp(Src) 99 F (37.2 C) (Oral)  Resp 20  Ht 5\' 5"  (1.651 m)  Wt 190 lb (  86.183 kg)  BMI 31.62 kg/m2  SpO2 98% Physical Exam  Constitutional: She is oriented to person, place, and time. She appears well-developed and well-nourished. She appears distressed.  HENT:  Head: Normocephalic and atraumatic.  Nose: Nose normal.  Mouth/Throat: Oropharynx is clear and moist. No oropharyngeal exudate.  Eyes: Conjunctivae and EOM are normal. Pupils are equal, round, and reactive to light. No scleral icterus.  Neck: Normal range of motion. Neck supple. No JVD present. No tracheal deviation present. No thyromegaly present.  Cardiovascular: Normal rate, regular rhythm and normal heart sounds.  Exam reveals no gallop and no friction rub.   No murmur heard. Pulmonary/Chest: Effort normal and breath sounds normal. No respiratory distress. She has no wheezes. She exhibits no tenderness.   Abdominal: Soft. Bowel sounds are normal. She exhibits no distension and no mass. There is tenderness. There is guarding. There is no rebound.  Diffuse abdominal TTP with guarding.   Musculoskeletal: Normal range of motion. She exhibits no edema or tenderness.  Lymphadenopathy:    She has no cervical adenopathy.  Neurological: She is alert and oriented to person, place, and time. No cranial nerve deficit. She exhibits normal muscle tone.  Skin: Skin is warm and dry. No rash noted. No erythema. No pallor.  Nursing note and vitals reviewed.   ED Course  Procedures  DIAGNOSTIC STUDIES: Oxygen Saturation is 98% on RA, normal by my interpretation.    COORDINATION OF CARE: 1:32 AM Discussed treatment plan with pt. Pt acknowledges and agrees to plan.   Labs Review Labs Reviewed  CBC WITH DIFFERENTIAL/PLATELET - Abnormal; Notable for the following:    Neutro Abs 8.1 (*)    All other components within normal limits  COMPREHENSIVE METABOLIC PANEL - Abnormal; Notable for the following:    Glucose, Bld 178 (*)    GFR calc non Af Amer 60 (*)    All other components within normal limits  URINE CULTURE  LIPASE, BLOOD  URINALYSIS, ROUTINE W REFLEX MICROSCOPIC (NOT AT [facility])  I-STAT CG4 LACTIC ACID, ED    Imaging Review Ct Abdomen Pelvis W Contrast  12/07/2015  CLINICAL DATA:  Abdominal pain and small bowel obstruction. EXAM: CT ABDOMEN AND PELVIS WITH CONTRAST TECHNIQUE: Multidetector CT imaging of the abdomen and pelvis was performed using the standard protocol following bolus administration of intravenous contrast. CONTRAST:  174mL OMNIPAQUE IOHEXOL 300 MG/ML  SOLN COMPARISON:  07/11/2014 FINDINGS: Lower chest and abdominal wall:  No contributory findings. Hepatobiliary: No focal liver abnormality.Distended gallbladder without superimposed inflammation. Dependent high-density material could reflect sludge or faintly calcified calculi. Pancreas: Unremarkable. Spleen: Unremarkable.  Adrenals/Urinary Tract: Stable benign lobulation of the left adrenal. No hydronephrosis or stone. Unremarkable bladder. Reproductive:Hysterectomy with negative adnexae. Stomach/Bowel: Dilated bowel with fluid levels in the right lower quadrant, with prominent mesenteric edema. Proximal jejunal loops are not dilated, and ileal loops are decompressed, initially concerning for closed loop obstruction but only single transition point is identified and oral contrast opacifies the maximally dilated loops in the right lower quadrant. Bowel in the right lower quadrant is distorted/angulated, as seen adhesive disease. The mesenteric edema is clustered in the right lower quadrant but there is no clear saclike morphology or mesenteric vessel distortion typical of internal hernia. No pneumatosis or perforation. Sigmoid predominant diverticulosis. Appendectomy. Vascular/Lymphatic: No acute vascular abnormality. No mass or adenopathy. Peritoneal: No ascites or pneumoperitoneum. Musculoskeletal: No acute abnormalities. IMPRESSION: 1. High-grade mid small bowel obstruction, likely from adhesions. The same pattern of obstruction was seen in 2015 but  mesenteric edema/congestion is greater today. 2. Gallstones versus sludge. Electronically Signed   By: Monte Fantasia M.D.   On: 12/07/2015 03:50   I have personally reviewed and evaluated these images and lab results as part of my medical decision-making.   EKG Interpretation   Date/Time:  Friday December 07 2015 01:27:25 EST Ventricular Rate:  82 PR Interval:  171 QRS Duration: 114 QT Interval:  396 QTC Calculation: 462 R Axis:   -15 Text Interpretation:  Sinus rhythm Consider right ventricular hypertrophy  No significant change since last tracing Confirmed by Glynn Octave [phone]) on 12/07/2015 2:06:52 AM      MDM   Final diagnoses:  None   Patient presents to the emergency department for abdominal pain. She states this feels like her obstructions. She  was given morphine for pain control which did help. CT scan does reveal high-grade obstruction.  I spoke with Dr. Grandville Silos who recommends for medical admission given the patient's age. I have placed the [facility] hospitalist for admission.  Dr. Porfirio Mylar accepts the patient to med surg.   I personally performed the services described in this documentation, which was scribed in my presence. The recorded information has been reviewed and is accurate.     Everlene Balls, MD 12/07/15 [phone]

## 2015-12-07 NOTE — ED Notes (Signed)
Patient currently consuming oral contrast.

## 2015-12-07 NOTE — ED Notes (Signed)
Dr. Oni at the bedside.  

## 2015-12-07 NOTE — Evaluation (Addendum)
Physical Therapy Evaluation Patient Details Name: [name] MRN: [id number] DOB: 1930-08-14 Today's Date: 12/07/2015   History of Present Illness  Hx of osteopenia, iritis, SBO, DM II, anemia, and Rt TKA.  Clinical Impression  Pt admitted with above complications. Pt currently with functional limitations due to the deficits listed below (see PT Problem List). Near baseline although feels a bit weak. Fairly stable with gait but anticipate this will continue to improve as she gets OOB and ambulates with staff during admission. Encouraged patient to ambulate with staff frequently. Will continue to follow and monitor progress while admitted. Has a friend visiting that will be available 24/7 when pt returns home.  Follow Up Recommendations No PT follow up    Equipment Recommendations  None recommended by PT    Recommendations for Other Services       Precautions / Restrictions Precautions Precautions: None Restrictions Weight Bearing Restrictions: No      Mobility  Bed Mobility Overal bed mobility: Independent                Transfers Overall transfer level: Modified independent Equipment used: Straight cane             General transfer comment: Good stability with use of cane for support  Ambulation/Gait Ambulation/Gait assistance: Supervision Ambulation Distance (Feet): 200 Feet Assistive device: Straight cane Gait Pattern/deviations: Step-through pattern;Decreased stride length;Antalgic;Staggering left   Gait velocity interpretation: at or above normal speed for age/gender General Gait Details: Stable for majority of ambulatory bout. Initially with some sway and an episode of staggering but pt was able to self correct and her mechanics improved as distance increased. States she feels a little weaker than baseline. No buckling noted, mildly antalgic gait.  Stairs            Wheelchair Mobility    Modified Rankin (Stroke Patients Only)        Balance Overall balance assessment: Needs assistance Sitting-balance support: No upper extremity supported;Feet supported Sitting balance-Leahy Scale: Normal     Standing balance support: No upper extremity supported Standing balance-Leahy Scale: Good                               Pertinent Vitals/Pain Pain Assessment: No/denies pain    Home Living Family/patient expects to be discharged to:: Private residence Living Arrangements: Alone Available Help at Discharge: Friend(s);Available 24 hours/day Type of Home: House Home Access: Level entry     Home Layout: One level Home Equipment: Shower seat;Cane - single point      Prior Function Level of Independence: Independent with assistive device(s)         Comments: cane for mobility. Very active in community. Travels with church     Hand Dominance        Extremity/Trunk Assessment   Upper Extremity Assessment: Defer to OT evaluation           Lower Extremity Assessment: Generalized weakness (Reports hx of Rt hip pain)         Communication   Communication: No difficulties  Cognition Arousal/Alertness: Awake/alert Behavior During Therapy: WFL for tasks assessed/performed Overall Cognitive Status: Within Functional Limits for tasks assessed                      General Comments      Exercises        Assessment/Plan    PT Assessment Patient needs continued PT services  PT Diagnosis Abnormality of gait;Generalized weakness   PT Problem List Decreased strength;Decreased balance;Decreased mobility  PT Treatment Interventions DME instruction;Gait training;Functional mobility training;Therapeutic activities;Therapeutic exercise;Balance training;Neuromuscular re-education;Patient/family education   PT Goals (Current goals can be found in the Care Plan section) Acute Rehab PT Goals Patient Stated Goal: Get some rest PT Goal Formulation: With patient Time For Goal Achievement:  12/21/15 Potential to Achieve Goals: Good    Frequency Min 3X/week   Barriers to discharge Decreased caregiver support lives alone    Co-evaluation               End of Session Equipment Utilized During Treatment: Gait belt Activity Tolerance: Patient tolerated treatment well Patient left: in bed;with call bell/phone within reach;with bed alarm set Nurse Communication: Mobility status         Time: [id number] PT Time Calculation (min) (ACUTE ONLY): 13 min   Charges:   PT Evaluation $PT Eval Low Complexity: 1 Procedure     PT G CodesEllouise Newer 12/07/2015, 12:39 PM Camille Bal [place], [place]

## 2015-12-07 NOTE — ED Notes (Signed)
Dr. Claudine Mouton at the bedside for update, planning for admission.

## 2015-12-08 ENCOUNTER — Inpatient Hospital Stay (HOSPITAL_COMMUNITY): Payer: Medicare Other

## 2015-12-08 LAB — GLUCOSE, CAPILLARY
GLUCOSE-CAPILLARY: 104 mg/dL — AB (ref 65–99)
Glucose-Capillary: 115 mg/dL — ABNORMAL HIGH (ref 65–99)
Glucose-Capillary: 93 mg/dL (ref 65–99)
Glucose-Capillary: 148 mg/dL — ABNORMAL HIGH (ref 65–99)

## 2015-12-08 MED ORDER — BISACODYL 10 MG RE SUPP
10.0000 mg | Freq: Once | RECTAL | Status: AC
  Administered 2015-12-08: 10 mg via RECTAL
  Filled 2015-12-08: qty 1

## 2015-12-08 MED ORDER — INSULIN ASPART 100 UNIT/ML ~~LOC~~ SOLN: 0.0000 [IU] | SUBCUTANEOUS | Status: DC

## 2015-12-08 MED ORDER — INSULIN ASPART 100 UNIT/ML ~~LOC~~ SOLN: 0.0000 [IU] | Freq: Three times a day (TID) | SUBCUTANEOUS | Status: DC

## 2015-12-08 MED ORDER — INSULIN ASPART 100 UNIT/ML ~~LOC~~ SOLN: 0.0000 [IU] | Freq: Every day | SUBCUTANEOUS | Status: DC

## 2015-12-08 NOTE — Progress Notes (Signed)
Patient ID: [name], female   DOB: 1930/05/04, 82 y.o.   MRN: [id number]   LOS: 1 day   Subjective: Feeling worse this morning with bloating and pain. Feels like she needs to have a BM but no success. +flatus, denies N/V.   Objective: Vital signs in last 24 hours: Temp:  [98.2 F (36.8 C)-99.1 F (37.3 C)] 98.4 F (36.9 C) (02/04 0931) Pulse Rate:  [66-70] 66 (02/04 0931) Resp:  [12-18] 18 (02/04 0931) BP: (120-145)/(54-68) 145/64 mmHg (02/04 0931) SpO2:  [68 %-100 %] 68 % (02/04 0931) Weight:  [86.4 kg (190 lb 7.6 oz)] 86.4 kg (190 lb 7.6 oz) (02/03 2100) Last BM Date: 12/06/15   Radiology Abd x-ray: Pending   Physical Exam General appearance: alert and no distress Resp: clear to auscultation bilaterally Cardio: regular rate and rhythm GI: Soft, mod TTP RLQ, +BS   Assessment/Plan: SBO -- Awaiting abd x-ray. Will give suppository.    Lisette Abu, PA-C Pager: [phone] General Trauma PA Pager: [phone]  12/08/2015

## 2015-12-08 NOTE — Progress Notes (Signed)
Patient ID: [name], female   DOB: 05-21-1930, 82 y.o.   MRN: [id number]   Abdominal xray shows no dilated small bowel and contrast in the colon She feels much better now  Will try clear liquids

## 2015-12-08 NOTE — Progress Notes (Signed)
[facility] PROGRESS NOTE  [name] [id number] DOB: [birth year] DOA: 12/07/2015 PCP: Kathlene November, MD  Assessment/Plan: Principal Problem:   Small bowel obstruction ([place]) - We'll continue general surgery recommendations - Continue nothing by mouth on maintenance IV fluids  Active Problems:   Dyslipidemia   Diabetes mellitus without complication ([place]) - Continue to monitor blood sugar levels every 4 hours while patient is nothing by mouth - Continue sliding scale insulin    UTI (lower urinary tract infection) - Continue Rocephin and follow-up with urine culture  Code Status: Full Family Communication: Discussed directly with patient Disposition Plan: Pending specialist recommendations   Consultants:  General surgery  Procedures:  None  Antibiotics:  Rocephin   HPI/Subjective: Patient has no new complaints today.  Objective: Filed Vitals:   12/08/15 0931 12/08/15 1627  BP: 145/64 125/61  Pulse: 66 71  Temp: 98.4 F (36.9 C) 97.8 F (36.6 C)  Resp: 18 17    Intake/Output Summary (Last 24 hours) at 12/08/15 1705 Last data filed at 12/08/15 1306  Gross per 24 hour  Intake 1211.25 ml  Output    650 ml  Net 561.25 ml   Filed Weights   12/07/15 0115 12/07/15 0532 12/07/15 2100  Weight: 86.183 kg (190 lb) 85.548 kg (188 lb 9.6 oz) 86.4 kg (190 lb 7.6 oz)    Exam:   General:  Patient in no acute distress, alert and awake  Cardiovascular: Regular rate and rhythm, no murmurs or rubs  Respiratory: No increased work of breathing, no wheezes  Abdomen: Soft, nondistended, no guarding  Musculoskeletal: No cyanosis or clubbing   Data Reviewed: Basic Metabolic Panel:  Recent Labs Lab 12/07/15 0200 12/07/15 0510  NA 139 140  K 4.3 4.6  CL 101 103  CO2 26 29  GLUCOSE 178* 152*  BUN 16 13  CREATININE 0.87 0.79  CALCIUM 9.7 9.4   Liver Function Tests:  Recent Labs Lab 12/07/15 0200  AST 19  ALT 14  ALKPHOS 56  BILITOT 0.5  PROT  7.5  ALBUMIN 4.1    Recent Labs Lab 12/07/15 0200  LIPASE 26   No results for input(s): AMMONIA in the last 168 hours. CBC:  Recent Labs Lab 12/07/15 0200 12/07/15 0510  WBC 10.0 9.3  NEUTROABS 8.1*  --   HGB 12.3 12.2  HCT 36.9 37.6  MCV 83.5 84.1  PLT 244 220   Cardiac Enzymes: No results for input(s): CKTOTAL, CKMB, CKMBINDEX, TROPONINI in the last 168 hours. BNP (last 3 results)  Recent Labs  12/07/15 0510  BNP 12.0    ProBNP (last 3 results) No results for input(s): PROBNP in the last 8760 hours.  CBG:  Recent Labs Lab 12/07/15 1127 12/07/15 1627 12/07/15 2214 12/08/15 0732 12/08/15 1123  GLUCAP 80 95 107* 115* 93    Recent Results (from the past 240 hour(s))  Urine culture     Status: None (Preliminary result)   Collection Time: 12/07/15  4:04 AM  Result Value Ref Range Status   Specimen Description URINE, CLEAN CATCH  Final   Special Requests NONE  Final   Culture >=100,000 COLONIES/mL GRAM NEGATIVE RODS  Final   Report Status PENDING  Incomplete     Studies: Ct Abdomen Pelvis W Contrast  12/07/2015  CLINICAL DATA:  Abdominal pain and small bowel obstruction. EXAM: CT ABDOMEN AND PELVIS WITH CONTRAST TECHNIQUE: Multidetector CT imaging of the abdomen and pelvis was performed using the standard protocol following bolus administration of intravenous contrast. CONTRAST:  146mL OMNIPAQUE IOHEXOL 300 MG/ML  SOLN COMPARISON:  07/11/2014 FINDINGS: Lower chest and abdominal wall:  No contributory findings. Hepatobiliary: No focal liver abnormality.Distended gallbladder without superimposed inflammation. Dependent high-density material could reflect sludge or faintly calcified calculi. Pancreas: Unremarkable. Spleen: Unremarkable. Adrenals/Urinary Tract: Stable benign lobulation of the left adrenal. No hydronephrosis or stone. Unremarkable bladder. Reproductive:Hysterectomy with negative adnexae. Stomach/Bowel: Dilated bowel with fluid levels in the right  lower quadrant, with prominent mesenteric edema. Proximal jejunal loops are not dilated, and ileal loops are decompressed, initially concerning for closed loop obstruction but only single transition point is identified and oral contrast opacifies the maximally dilated loops in the right lower quadrant. Bowel in the right lower quadrant is distorted/angulated, as seen adhesive disease. The mesenteric edema is clustered in the right lower quadrant but there is no clear saclike morphology or mesenteric vessel distortion typical of internal hernia. No pneumatosis or perforation. Sigmoid predominant diverticulosis. Appendectomy. Vascular/Lymphatic: No acute vascular abnormality. No mass or adenopathy. Peritoneal: No ascites or pneumoperitoneum. Musculoskeletal: No acute abnormalities. IMPRESSION: 1. High-grade mid small bowel obstruction, likely from adhesions. The same pattern of obstruction was seen in 2015 but mesenteric edema/congestion is greater today. 2. Gallstones versus sludge. Electronically Signed   By: Monte Fantasia M.D.   On: 12/07/2015 03:50   Dg Abd 2 Views  12/08/2015  CLINICAL DATA:  Small bowel obstruction. EXAM: ABDOMEN - 2 VIEW COMPARISON:  CT abdomen and pelvis 12/07/2015 FINDINGS: There is no evidence of intraperitoneal free air. Oral contrast from yesterday's CT has progressed from small bowel into the ascending and transverse colon. Gas is present in several loops of nondilated small bowel predominantly in the left mid upper abdomen. No residual dilated small bowel loops are identified. Calcifications in the pelvis represent phleboliths. No acute osseous abnormality is identified. IMPRESSION: No evidence of residual small bowel obstruction. Interval contrast passage into the colon. Electronically Signed   By: Logan Bores M.D.   On: 12/08/2015 13:49    Scheduled Meds: . aspirin EC  81 mg Oral Daily  . cefTRIAXone (ROCEPHIN)  IV  1 g Intravenous Q24H  . insulin aspart  0-9 Units  Subcutaneous TID WC  . pravastatin  10 mg Oral QHS  . prednisoLONE acetate  1 drop Both Eyes QID   Continuous Infusions: . dextrose 5 % and 0.45 % NaCl with KCl 20 mEq/L 75 mL/hr at 12/08/15 0140    Time spent: > 35 minutes    Velvet Bathe  [facility] Pager [phone]. If 7PM-7AM, please contact night-coverage at [email or web address], password [facility] 12/08/2015, 5:05 PM  LOS: 1 day

## 2015-12-09 LAB — GLUCOSE, CAPILLARY
GLUCOSE-CAPILLARY: 110 mg/dL — AB (ref 65–99)
GLUCOSE-CAPILLARY: 120 mg/dL — AB (ref 65–99)

## 2015-12-09 LAB — URINE CULTURE

## 2015-12-09 NOTE — Progress Notes (Signed)
Had a bowel movement this morning,tolerated her lunch meal  well.Disharged with no skin issues.

## 2015-12-09 NOTE — Progress Notes (Signed)
Utilization Review Completed.[name] T2/03/2016  

## 2015-12-09 NOTE — Discharge Summary (Signed)
Physician Discharge Summary  [name] [id number] DOB: 1930-01-24 DOA: 12/07/2015  PCP: Kathlene November, MD  Admit date: 12/07/2015 Discharge date: 12/09/2015  Time spent: > 35 minutes  Recommendations for Outpatient Follow-up:  1. Continue to monitor blood sugars   Discharge Diagnoses:  Principal Problem:   Small bowel obstruction (HCC) Active Problems:   Dyslipidemia   Diabetes mellitus without complication (HCC)   UTI (lower urinary tract infection)   SBO (small bowel obstruction) ([place])   Discharge Condition: stable  Diet recommendation: soft diet  Filed Weights   12/07/15 0532 12/07/15 2100 12/08/15 2049  Weight: 85.548 kg (188 lb 9.6 oz) 86.4 kg (190 lb 7.6 oz) 86.7 kg (191 lb 2.2 oz)    History of present illness:  82 year old African-American female who presented with small bowel obstruction.  Hospital Course:  SBO - Resolved with conservative management - Patient tolerating diet on day of discharge  From known comorbidities listed above will continue home medication regimen listed below  Procedures:  None  Consultations:  General surgery  Discharge Exam: Filed Vitals:   12/09/15 0430 12/09/15 0836  BP: 155/80 139/78  Pulse: 71 75  Temp: 98.1 F (36.7 C) 98.7 F (37.1 C)  Resp: 19 20    General: Patient in no acute distress, alert and awake Cardiovascular: Regular rate and rhythm, no murmurs or rubs Respiratory: No increased work of breathing, equal chest rise, no wheezes Abdomen: Soft, nondistended, nontender  Discharge Instructions   Discharge Instructions    Call MD for:  difficulty breathing, headache or visual disturbances    Complete by:  As directed      Call MD for:  redness, tenderness, or signs of infection (pain, swelling, redness, odor or green/yellow discharge around incision site)    Complete by:  As directed      Call MD for:  severe uncontrolled pain    Complete by:  As directed      Call MD for:  temperature >100.4     Complete by:  As directed      Diet - low sodium heart healthy    Complete by:  As directed      Discharge instructions    Complete by:  As directed   Patient to follow-up with primary care physician should any new concerns arise.     Increase activity slowly    Complete by:  As directed           Current Discharge Medication List    CONTINUE these medications which have NOT CHANGED   Details  metFORMIN (GLUCOPHAGE) 500 MG tablet Take 1 tablet (500 mg total) by mouth 2 (two) times daily with a meal. Qty: 180 tablet, Refills: 1    prednisoLONE acetate (PRED FORTE) 1 % ophthalmic suspension Place 1 drop into both eyes 4 (four) times daily.    aspirin 81 MG tablet Take 81 mg by mouth daily.      Multiple Vitamins-Minerals (CENTRUM SILVER PO) Take 1 capsule by mouth daily.      pravastatin (PRAVACHOL) 10 MG tablet Take 1 tablet (10 mg total) by mouth at bedtime. Qty: 30 tablet, Refills: 0       Allergies  Allergen Reactions  . Zocor [Simvastatin]     Ear ringing  . Lipitor [Atorvastatin]     tinnitus      The results of significant diagnostics from this hospitalization (including imaging, microbiology, ancillary and laboratory) are listed below for reference.    Significant Diagnostic Studies: Ct  Abdomen Pelvis W Contrast  12/07/2015  CLINICAL DATA:  Abdominal pain and small bowel obstruction. EXAM: CT ABDOMEN AND PELVIS WITH CONTRAST TECHNIQUE: Multidetector CT imaging of the abdomen and pelvis was performed using the standard protocol following bolus administration of intravenous contrast. CONTRAST:  165mL OMNIPAQUE IOHEXOL 300 MG/ML  SOLN COMPARISON:  07/11/2014 FINDINGS: Lower chest and abdominal wall:  No contributory findings. Hepatobiliary: No focal liver abnormality.Distended gallbladder without superimposed inflammation. Dependent high-density material could reflect sludge or faintly calcified calculi. Pancreas: Unremarkable. Spleen: Unremarkable. Adrenals/Urinary  Tract: Stable benign lobulation of the left adrenal. No hydronephrosis or stone. Unremarkable bladder. Reproductive:Hysterectomy with negative adnexae. Stomach/Bowel: Dilated bowel with fluid levels in the right lower quadrant, with prominent mesenteric edema. Proximal jejunal loops are not dilated, and ileal loops are decompressed, initially concerning for closed loop obstruction but only single transition point is identified and oral contrast opacifies the maximally dilated loops in the right lower quadrant. Bowel in the right lower quadrant is distorted/angulated, as seen adhesive disease. The mesenteric edema is clustered in the right lower quadrant but there is no clear saclike morphology or mesenteric vessel distortion typical of internal hernia. No pneumatosis or perforation. Sigmoid predominant diverticulosis. Appendectomy. Vascular/Lymphatic: No acute vascular abnormality. No mass or adenopathy. Peritoneal: No ascites or pneumoperitoneum. Musculoskeletal: No acute abnormalities. IMPRESSION: 1. High-grade mid small bowel obstruction, likely from adhesions. The same pattern of obstruction was seen in 2015 but mesenteric edema/congestion is greater today. 2. Gallstones versus sludge. Electronically Signed   By: Monte Fantasia M.D.   On: 12/07/2015 03:50   Dg Abd 2 Views  12/08/2015  CLINICAL DATA:  Small bowel obstruction. EXAM: ABDOMEN - 2 VIEW COMPARISON:  CT abdomen and pelvis 12/07/2015 FINDINGS: There is no evidence of intraperitoneal free air. Oral contrast from yesterday's CT has progressed from small bowel into the ascending and transverse colon. Gas is present in several loops of nondilated small bowel predominantly in the left mid upper abdomen. No residual dilated small bowel loops are identified. Calcifications in the pelvis represent phleboliths. No acute osseous abnormality is identified. IMPRESSION: No evidence of residual small bowel obstruction. Interval contrast passage into the colon.  Electronically Signed   By: Logan Bores M.D.   On: 12/08/2015 13:49    Microbiology: Recent Results (from the past 240 hour(s))  Urine culture     Status: None   Collection Time: 12/07/15  4:04 AM  Result Value Ref Range Status   Specimen Description URINE, CLEAN CATCH  Final   Special Requests NONE  Final   Culture >=100,000 COLONIES/mL ESCHERICHIA COLI  Final   Report Status 12/09/2015 FINAL  Final   Organism ID, Bacteria ESCHERICHIA COLI  Final      Susceptibility   Escherichia coli - MIC*    AMPICILLIN 8 SENSITIVE Sensitive     CEFAZOLIN <=4 SENSITIVE Sensitive     CEFTRIAXONE <=1 SENSITIVE Sensitive     CIPROFLOXACIN <=0.25 SENSITIVE Sensitive     GENTAMICIN <=1 SENSITIVE Sensitive     IMIPENEM <=0.25 SENSITIVE Sensitive     NITROFURANTOIN <=16 SENSITIVE Sensitive     TRIMETH/SULFA <=20 SENSITIVE Sensitive     AMPICILLIN/SULBACTAM 4 SENSITIVE Sensitive     PIP/TAZO <=4 SENSITIVE Sensitive     * >=100,000 COLONIES/mL ESCHERICHIA COLI     Labs: Basic Metabolic Panel:  Recent Labs Lab 12/07/15 0200 12/07/15 0510  NA 139 140  K 4.3 4.6  CL 101 103  CO2 26 29  GLUCOSE 178* 152*  BUN 16  13  CREATININE 0.87 0.79  CALCIUM 9.7 9.4   Liver Function Tests:  Recent Labs Lab 12/07/15 0200  AST 19  ALT 14  ALKPHOS 56  BILITOT 0.5  PROT 7.5  ALBUMIN 4.1    Recent Labs Lab 12/07/15 0200  LIPASE 26   No results for input(s): AMMONIA in the last 168 hours. CBC:  Recent Labs Lab 12/07/15 0200 12/07/15 0510  WBC 10.0 9.3  NEUTROABS 8.1*  --   HGB 12.3 12.2  HCT 36.9 37.6  MCV 83.5 84.1  PLT 244 220   Cardiac Enzymes: No results for input(s): CKTOTAL, CKMB, CKMBINDEX, TROPONINI in the last 168 hours. BNP: BNP (last 3 results)  Recent Labs  12/07/15 0510  BNP 12.0    ProBNP (last 3 results) No results for input(s): PROBNP in the last 8760 hours.  CBG:  Recent Labs Lab 12/08/15 1123 12/08/15 1626 12/08/15 2048 12/09/15 0835  12/09/15 1150  GLUCAP 93 148* 104* 110* 120*       Signed:  Velvet Bathe MD.  [facility] 12/09/2015, 2:09 PM

## 2015-12-09 NOTE — Progress Notes (Signed)
Patient ID: [name], female   DOB: 05/20/1930, 82 y.o.   MRN: [id number]   LOS: 2 days   Subjective: Feeling better this morning.  Had a BM yesterday.   Objective: Vital signs in last 24 hours: Temp:  [97.8 F (36.6 C)-98.7 F (37.1 C)] 98.7 F (37.1 C) (02/05 0836) Pulse Rate:  [68-75] 75 (02/05 0836) Resp:  [17-20] 20 (02/05 0836) BP: (125-155)/(56-80) 139/78 mmHg (02/05 0836) SpO2:  [99 %-100 %] 100 % (02/05 0836) Weight:  [86.7 kg (191 lb 2.2 oz)] 86.7 kg (191 lb 2.2 oz) (02/04 2049) Last BM Date: 12/08/15   Radiology Abd x-ray: Contrast in colon   Physical Exam General appearance: alert and no distress Resp: clear to auscultation bilaterally Cardio: regular rate and rhythm GI: Soft, no TTP    Assessment/Plan: Had a BM with suppository and contrast in colon.  Advance to soft foods.  Will sign off.  Please call with any questions or concerns.    Rosario Adie, MD  Colorectal and Hyrum Surgery  12/09/2015

## 2015-12-10 ENCOUNTER — Telehealth: Payer: Self-pay | Admitting: Internal Medicine

## 2015-12-10 NOTE — Telephone Encounter (Signed)
Pt will need to reschedule her surgical clearance appt as well as a hospital follow-up appt.

## 2015-12-10 NOTE — Telephone Encounter (Signed)
Pt called stating that testing was arranged thru Dr. Larose Kells since she was still in the hospital 2/3 and didn't  Come for surgical clearance appt. She said they are waiting for Dr. Larose Kells clearance to proceed with surgery scheduled for 12/17/15 and that she needs to talk with nurse/cma. Pt ph# [phone].

## 2015-12-10 NOTE — Telephone Encounter (Signed)
Scheduled for 2/8 3:30pm

## 2015-12-10 NOTE — Telephone Encounter (Signed)
Noted, thank you

## 2015-12-12 ENCOUNTER — Ambulatory Visit (INDEPENDENT_AMBULATORY_CARE_PROVIDER_SITE_OTHER): Payer: Medicare Other | Admitting: Internal Medicine

## 2015-12-12 ENCOUNTER — Encounter: Payer: Self-pay | Admitting: Internal Medicine

## 2015-12-12 VITALS — BP 122/74 | HR 80 | Temp 97.9°F | Ht 65.0 in | Wt 189.4 lb

## 2015-12-12 DIAGNOSIS — Z01818 Encounter for other preprocedural examination: Secondary | ICD-10-CM

## 2015-12-12 NOTE — Progress Notes (Signed)
Pre visit review using our clinic review tool, if applicable. No additional management support is needed unless otherwise documented below in the visit note. 

## 2015-12-12 NOTE — Patient Instructions (Signed)
We'll need to obtain a stress test before clear for surgery.

## 2015-12-12 NOTE — Progress Notes (Signed)
Subjective:    Patient ID: [name], female    DOB: [birth year], 82 y.o.   MRN: [id number]  DOS:  12/12/2015 Type of visit - description : Surgical clearance  Interval history: Patient in need of a total hip replacement. Was recently admitted to the hospital with SBO, resolved with conservative treatment, now feeling really well.   Review of Systems  Denies fever chills No chest pain, difficulty breathing or orthopnea. No lower extremity edema She is able to be very active without shortness of breath, only limited by hip pain. No nausea or vomiting.  Past Medical History  Diagnosis Date  . Osteopenia   . Abnormal breast exam 2004    MMG neg (eval by surgeon neg)  . Hx SBO 2004 , 2007, 2012     exploratory surgery 9-12   . Iritis 2005  . Type II diabetes mellitus ([place])   . Anemia   . History of blood transfusion     "related to a surgery, I think"  . Osteoarthritis   . Arthritis     "knees" (07/11/2014)  . SBO (small bowel obstruction) ([place]) 07-2014    Past Surgical History  Procedure Laterality Date  . Total knee arthroplasty Right 10/2010  . Exploratory laparotomy  07/2011    SBO  . Appendectomy    . Total abdominal hysterectomy  1970's    for bleeding, no cancer  . Dilation and curettage of uterus    . Cataract extraction Bilateral     Social History   Social History  . Marital Status: Widowed    Spouse Name: N/A  . Number of Children: 2  . Years of Education: N/A   Occupational History  . Has her own business     makes aprons    Social History Main Topics  . Smoking status: Never Smoker   . Smokeless tobacco: Never Used  . Alcohol Use: No  . Drug Use: No  . Sexual Activity: No   Other Topics Concern  . Not on file   Social History Narrative   Lives by herself, her family is in [place], lost 2 sisters                  Medication List       This list is accurate as of: 12/12/15 11:59 PM.  Always use your most recent med list.               aspirin 81 MG tablet  Take 81 mg by mouth daily.     CENTRUM SILVER PO  Take 1 capsule by mouth daily.     metFORMIN 500 MG tablet  Commonly known as:  GLUCOPHAGE  Take 1 tablet (500 mg total) by mouth 2 (two) times daily with a meal.     pravastatin 10 MG tablet  Commonly known as:  PRAVACHOL  Take 1 tablet (10 mg total) by mouth at bedtime.     prednisoLONE acetate 1 % ophthalmic suspension  Commonly known as:  PRED FORTE  Place 1 drop into both eyes 4 (four) times daily.           Objective:   Physical Exam BP 122/74 mmHg  Pulse 80  Temp(Src) 97.9 F (36.6 C) (Oral)  Ht 5\' 5"  (1.651 m)  Wt 189 lb 6 oz (85.9 kg)  BMI 31.51 kg/m2  SpO2 99% General:   Well developed, well nourished . NAD.  HEENT:  Normocephalic . Face symmetric, atraumatic. Neck: No  JVD at 45 Lungs:  CTA B Normal respiratory effort, no intercostal retractions, no accessory muscle use. Heart: RRR,  no murmur.  no pretibial edema bilaterally  Abdomen:  Not distended, soft, non-tender. No rebound or rigidity.  Skin: Not pale. Not jaundice Neurologic:  alert & oriented X3.  Speech normal, gait appropriate for age and unassisted Psych--  Cognition and judgment appear intact.  Cooperative with normal attention span and concentration.  Behavior appropriate. No anxious or depressed appearing.    Assessment & Plan:    Assessment DM  hyperlipidemia Osteopenia Osteoarthritis Iritis Multiple SBO, exploratory surgery 2012  Plan 12-12-2015 Surgical clearance: 82 year old lady with diabetes, very active without symptoms of CHF or CAD needs a hip replacement. Recent labs show a normal creatinine, CBC. Last EKG during recent admission normal sinus rhythm. No objective evaluation of her heart except for a echocardiogram 6 years ago. Plan: Stress test. Further advice would results

## 2015-12-27 ENCOUNTER — Telehealth: Payer: Self-pay | Admitting: Internal Medicine

## 2015-12-27 ENCOUNTER — Telehealth (HOSPITAL_COMMUNITY): Payer: Self-pay | Admitting: *Deleted

## 2015-12-27 NOTE — Telephone Encounter (Signed)
Left message on voicemail in reference to upcoming appointment scheduled for 01/01/16. Phone number given for a call back so details instructions can be given. Hubbard Robinson, RN

## 2015-12-27 NOTE — Telephone Encounter (Signed)
Received call from [facility] with [employer]. She was transferred a pt and told it was [name] but it was a different pt altogether. She wanted to know if pt needed called. I called pt and she indicates no issues and no need for nurse triage. Called [employer] and notified Delana Meyer that pt does not need call from nurse.

## 2015-12-31 ENCOUNTER — Telehealth (HOSPITAL_COMMUNITY): Payer: Self-pay | Admitting: *Deleted

## 2015-12-31 NOTE — Telephone Encounter (Signed)
Left message on voicemail in reference to upcoming appointment scheduled for 01/01/16. Phone number given for a call back so details instructions can be given. Hubbard Robinson, RN

## 2016-01-01 ENCOUNTER — Ambulatory Visit (HOSPITAL_COMMUNITY): Payer: Medicare Other | Attending: Internal Medicine

## 2016-01-01 VITALS — Ht 65.0 in | Wt 189.0 lb

## 2016-01-01 DIAGNOSIS — E119 Type 2 diabetes mellitus without complications: Secondary | ICD-10-CM | POA: Diagnosis not present

## 2016-01-01 DIAGNOSIS — Z01818 Encounter for other preprocedural examination: Secondary | ICD-10-CM | POA: Diagnosis not present

## 2016-01-01 LAB — MYOCARDIAL PERFUSION IMAGING
CHL CUP NUCLEAR SSS: 7
CSEPPHR: 106 {beats}/min
LHR: 0.31
LV dias vol: 50 mL
LVSYSVOL: 10 mL
Rest HR: 71 {beats}/min
SDS: 4
SRS: 3
TID: 0.89

## 2016-01-01 MED ORDER — TECHNETIUM TC 99M SESTAMIBI GENERIC - CARDIOLITE
10.8000 | Freq: Once | INTRAVENOUS | Status: AC | PRN
  Administered 2016-01-01: 11 via INTRAVENOUS

## 2016-01-01 MED ORDER — REGADENOSON 0.4 MG/5ML IV SOLN
0.4000 mg | Freq: Once | INTRAVENOUS | Status: AC
  Administered 2016-01-01: 0.4 mg via INTRAVENOUS

## 2016-01-01 MED ORDER — TECHNETIUM TC 99M SESTAMIBI GENERIC - CARDIOLITE
32.7000 | Freq: Once | INTRAVENOUS | Status: AC | PRN
Start: 2016-01-01 — End: 2016-01-01
  Administered 2016-01-01: 32.7 via INTRAVENOUS

## 2016-01-03 ENCOUNTER — Telehealth: Payer: Self-pay | Admitting: Internal Medicine

## 2016-01-03 DIAGNOSIS — Z961 Presence of intraocular lens: Secondary | ICD-10-CM | POA: Diagnosis not present

## 2016-01-03 NOTE — Telephone Encounter (Signed)
Surgical clearance letter printed and signed by Dr. Larose Kells and faxed to Dr. Berenice Primas at [place]: [name] at  [phone] along w/ OV notes from 12/12/2015, nuclear stress test results from 01/01/2016 and EKG from 12/07/2015.

## 2016-01-03 NOTE — Telephone Encounter (Signed)
Advise patient, stress test low risk,  she is clear for surgery, also notify her surgeon.

## 2016-01-03 NOTE — Telephone Encounter (Signed)
LMOM informing Pt of nuclear stress test results and that she has been cleared for surgery. Instructed her to call if she has any questions.

## 2016-01-03 NOTE — Telephone Encounter (Signed)
[name], CMA at 01/03/2016 8:27 AM     Status: Signed       Expand All Collapse All   Surgical clearance letter printed and signed by Dr. Larose Kells and faxed to Dr. Berenice Primas at [place]: [name] at [phone] along w/ OV notes from 12/12/2015, nuclear stress test results from 01/01/2016 and EKG from 12/07/2015.             Colon Branch, MD at 01/03/2016 8:11 AM     Status: Signed       Expand All Collapse All   Advise patient, stress test low risk, she is clear for surgery, also notify her surgeon.

## 2016-01-03 NOTE — Telephone Encounter (Signed)
Received fax confirmation on 01/03/2016 at 0841.

## 2016-01-15 ENCOUNTER — Other Ambulatory Visit: Payer: Self-pay | Admitting: Internal Medicine

## 2016-01-24 DIAGNOSIS — E119 Type 2 diabetes mellitus without complications: Secondary | ICD-10-CM | POA: Diagnosis not present

## 2016-04-21 ENCOUNTER — Ambulatory Visit: Payer: Medicare Other | Admitting: Internal Medicine

## 2016-04-21 ENCOUNTER — Telehealth: Payer: Self-pay | Admitting: Internal Medicine

## 2016-04-21 NOTE — Telephone Encounter (Signed)
Patient lvm at 9:10am cancelling her 1:15pm appointment due to dental work and being on antibiotics patient will call back to [facility],. Charge or no charge

## 2016-04-22 ENCOUNTER — Encounter: Payer: Self-pay | Admitting: Internal Medicine

## 2016-04-22 NOTE — Telephone Encounter (Signed)
No, thx 

## 2016-05-02 ENCOUNTER — Ambulatory Visit (INDEPENDENT_AMBULATORY_CARE_PROVIDER_SITE_OTHER): Payer: Medicare Other | Admitting: Internal Medicine

## 2016-05-02 ENCOUNTER — Encounter: Payer: Self-pay | Admitting: Internal Medicine

## 2016-05-02 VITALS — BP 128/72 | HR 87 | Temp 98.0°F | Ht 65.0 in | Wt 191.0 lb

## 2016-05-02 DIAGNOSIS — E785 Hyperlipidemia, unspecified: Secondary | ICD-10-CM | POA: Diagnosis not present

## 2016-05-02 DIAGNOSIS — M8580 Other specified disorders of bone density and structure, unspecified site: Secondary | ICD-10-CM

## 2016-05-02 DIAGNOSIS — Z09 Encounter for follow-up examination after completed treatment for conditions other than malignant neoplasm: Secondary | ICD-10-CM | POA: Insufficient documentation

## 2016-05-02 DIAGNOSIS — E118 Type 2 diabetes mellitus with unspecified complications: Secondary | ICD-10-CM

## 2016-05-02 DIAGNOSIS — M159 Polyosteoarthritis, unspecified: Secondary | ICD-10-CM

## 2016-05-02 DIAGNOSIS — M150 Primary generalized (osteo)arthritis: Secondary | ICD-10-CM

## 2016-05-02 NOTE — Patient Instructions (Signed)
  GO TO THE FRONT DESK Schedule your next appointment for a  physical exam in 4 months, fasting   Schedule labs to be done 10 days from today. FLP--- high cholesterol A1c, microalbumin -- DM

## 2016-05-02 NOTE — Progress Notes (Signed)
Subjective:    Patient ID: [name], female    DOB: February 08, 1930, 82 y.o.   MRN: [id number]  DOS:  05/02/2016 Type of visit - description : rov Interval history: Doing well, no major concerns, Likes her cholesterol check. Good med compliance, CBGs around 112 when checked   Review of Systems  no chest pain or difficulty breathing No nausea vomiting or diarrhea Past Medical History  Diagnosis Date  . Osteopenia   . Abnormal breast exam 2004    MMG neg (eval by surgeon neg)  . Hx SBO 2004 , 2007, 2012     exploratory surgery 9-12   . Iritis 2005  . Type II diabetes mellitus ([place])   . Anemia   . History of blood transfusion     "related to a surgery, I think"  . Osteoarthritis   . Arthritis     "knees" (07/11/2014)  . SBO (small bowel obstruction) ([place]) 07-2014    Past Surgical History  Procedure Laterality Date  . Total knee arthroplasty Right 10/2010  . Exploratory laparotomy  07/2011    SBO  . Appendectomy    . Total abdominal hysterectomy  1970's    for bleeding, no cancer  . Dilation and curettage of uterus    . Cataract extraction Bilateral     Social History   Social History  . Marital Status: Widowed    Spouse Name: N/A  . Number of Children: 2  . Years of Education: N/A   Occupational History  . Has her own business     makes aprons    Social History Main Topics  . Smoking status: Never Smoker   . Smokeless tobacco: Never Used  . Alcohol Use: No  . Drug Use: No  . Sexual Activity: No   Other Topics Concern  . Not on file   Social History Narrative   Lives by herself, her family is in [place], lost 2 sisters                  Medication List       This list is accurate as of: 05/02/16  9:02 PM.  Always use your most recent med list.               aspirin 81 MG tablet  Take 81 mg by mouth daily.     CENTRUM SILVER PO  Take 1 capsule by mouth daily.     metFORMIN 500 MG tablet  Commonly known as:  GLUCOPHAGE  Take 1  tablet (500 mg total) by mouth 2 (two) times daily with a meal.     pravastatin 10 MG tablet  Commonly known as:  PRAVACHOL  Take 1 tablet (10 mg total) by mouth at bedtime.           Objective:   Physical Exam BP 128/72 mmHg  Pulse 87  Temp(Src) 98 F (36.7 C) (Oral)  Ht 5\' 5"  (1.651 m)  Wt 191 lb (86.637 kg)  BMI 31.78 kg/m2  SpO2 97% General:   Well developed, well nourished . NAD.  HEENT:  Normocephalic . Face symmetric, atraumatic Lungs:  CTA B Normal respiratory effort, no intercostal retractions, no accessory muscle use. Heart: RRR,  ? syst  murmur.  No pretibial edema bilaterally  DIABETIC FEET EXAM: No lower extremity edema Normal pedal pulses bilaterally Skin normal, nails normal, no calluses Pinprick examination of the feet normal. Neurologic:  alert & oriented X3.  Speech normal, gait appropriate for age  and unassisted Psych--  Cognition and judgment appear intact.  Cooperative with normal attention span and concentration.  Behavior appropriate. No anxious or depressed appearing.      Assessment & Plan:   Assessment DM  hyperlipidemia Osteopenia-- dexa 05-2015 T score -1.4 Osteoarthritis Iritis Multiple SBO, exploratory surgery 2012  PLAN: DM: Continue metformin, feet exam negative today, sees eye doctor regularly, check A1c and micro Dyslipidemia: Continue Pravachol, recent LFTs normal. Check FLP Osteopenia: on ca and vit D DJD: Decided not to proceed with hip replacement Systolic murmur? Reassess on RTC RTC 4 months, CPX

## 2016-05-02 NOTE — Progress Notes (Signed)
Pre visit review using our clinic review tool, if applicable. No additional management support is needed unless otherwise documented below in the visit note. 

## 2016-05-02 NOTE — Assessment & Plan Note (Signed)
DM: Continue metformin, feet exam negative today, sees eye doctor regularly, check A1c and micro Dyslipidemia: Continue Pravachol, recent LFTs normal. Check FLP Osteopenia: on ca and vit D DJD: Decided not to proceed with hip replacement Systolic murmur? Reassess on RTC RTC 4 months, CPX

## 2016-05-09 ENCOUNTER — Other Ambulatory Visit: Payer: Self-pay | Admitting: Internal Medicine

## 2016-05-12 ENCOUNTER — Other Ambulatory Visit (INDEPENDENT_AMBULATORY_CARE_PROVIDER_SITE_OTHER): Payer: Medicare Other

## 2016-05-12 ENCOUNTER — Telehealth: Payer: Self-pay | Admitting: Internal Medicine

## 2016-05-12 DIAGNOSIS — E785 Hyperlipidemia, unspecified: Secondary | ICD-10-CM

## 2016-05-12 DIAGNOSIS — E118 Type 2 diabetes mellitus with unspecified complications: Secondary | ICD-10-CM | POA: Diagnosis not present

## 2016-05-12 LAB — LIPID PANEL
CHOLESTEROL: 202 mg/dL — AB (ref 0–200)
HDL: 52.1 mg/dL (ref >39.00)
LDL Cholesterol: 128 mg/dL — ABNORMAL HIGH (ref 0–99)
NonHDL: 150.1
TRIGLYCERIDES: 111 mg/dL (ref 0.0–149.0)
Total CHOL/HDL Ratio: 4
VLDL: 22.2 mg/dL (ref 0.0–40.0)

## 2016-05-12 LAB — MICROALBUMIN / CREATININE URINE RATIO
CREATININE, U: 149.6 mg/dL
MICROALB UR: 1.2 mg/dL (ref 0.0–1.9)
Microalb Creat Ratio: 0.8 mg/g (ref 0.0–30.0)

## 2016-05-12 LAB — HEMOGLOBIN A1C: Hgb A1c MFr Bld: 6.3 % (ref 4.6–6.5)

## 2016-05-12 NOTE — Telephone Encounter (Addendum)
PCP completely full for the week after being out last week. However, he does appear to have some doc of the day slots blocked on Wednesday and several on Friday, okay to use one.

## 2016-05-12 NOTE — Telephone Encounter (Signed)
Pt came in for labs. At checkout she mentioned that she had a fall. Pt would like to have a x-ray because she is in pain. Pt would like to see her PCP tomorrow. Informed of providers availability. Pt would like to know considering, could PCP work her in on tomorrow's schedule?   Please advise.

## 2016-05-14 ENCOUNTER — Other Ambulatory Visit: Payer: Self-pay | Admitting: Orthopedic Surgery

## 2016-05-15 NOTE — Telephone Encounter (Signed)
Pt states is feeling better after waiting on call that she did not received until today 05-13-16 (apologizes was given to pt) but she mentioned that is not hurting badly like she was, just still a little bit bruised and sore, pt states her left leg gave out and that is why she fell.  Pt will be seeing Dr Erlinda Hong for the situation of her leg so she is not needing appt with Larose Kells. Pt would like to inform her provider that she is having surgery on June 23, 2016 with Dr Erlinda Hong. Appt was still offered to pt to see provider but pt refused the appt with [facility]. Pt wants to be called with her labs result that was done on 05-12-16. Please advise.

## 2016-05-15 NOTE — Telephone Encounter (Signed)
Spoke w/ Pt, also apologized that she had not received a call until today. Pt states she understands we are busy. Informed her of lab results and recommendation to increase Pravastatin to 40 mg. She informed me that she frequently forgets to take the Pravastatin at bedtime. Informed her I would let Dr. Larose Kells know and recommended her switching Pravastatin to either in the AM or at lunchtime so she does not forget medication. Pt verbalized understanding. Again offered her an appt regarding pain for fall, she has scheduled appt tomorrow at 1115.

## 2016-05-15 NOTE — Telephone Encounter (Signed)
Kennyth Lose, can you call Pt and see if she is still wanting to be seen for fall. I do not see where Shiquita called on Monday. See if she will see another provider or Dr. Larose Kells has two openings tomorrow. Thank you.

## 2016-05-15 NOTE — Telephone Encounter (Signed)
If  is going to increase compliance, okay to take pravastatin in the morning

## 2016-05-15 NOTE — Telephone Encounter (Signed)
Result Note     Advise patient,    Diabetes under excellent control, needs to increase cholesterol medicine to Pravachol 40 mg one tablet daily, send a prescription, we'll recheck her blood when she comes back in 4 months for a physical.

## 2016-05-16 ENCOUNTER — Ambulatory Visit (INDEPENDENT_AMBULATORY_CARE_PROVIDER_SITE_OTHER): Payer: Medicare Other | Admitting: Internal Medicine

## 2016-05-16 ENCOUNTER — Ambulatory Visit (HOSPITAL_BASED_OUTPATIENT_CLINIC_OR_DEPARTMENT_OTHER)
Admission: RE | Admit: 2016-05-16 | Discharge: 2016-05-16 | Disposition: A | Discharge status: Home / Self Care | Payer: Medicare Other | Source: Ambulatory Visit | Attending: Internal Medicine | Admitting: Internal Medicine

## 2016-05-16 ENCOUNTER — Other Ambulatory Visit: Payer: Self-pay | Admitting: Internal Medicine

## 2016-05-16 ENCOUNTER — Encounter: Payer: Self-pay | Admitting: Internal Medicine

## 2016-05-16 VITALS — BP 122/62 | HR 92 | Temp 98.0°F | Ht 65.0 in | Wt 187.5 lb

## 2016-05-16 DIAGNOSIS — M545 Low back pain, unspecified: Secondary | ICD-10-CM

## 2016-05-16 DIAGNOSIS — M4316 Spondylolisthesis, lumbar region: Secondary | ICD-10-CM | POA: Diagnosis not present

## 2016-05-16 DIAGNOSIS — M5136 Other intervertebral disc degeneration, lumbar region: Secondary | ICD-10-CM | POA: Insufficient documentation

## 2016-05-16 DIAGNOSIS — M1612 Unilateral primary osteoarthritis, left hip: Secondary | ICD-10-CM | POA: Diagnosis not present

## 2016-05-16 DIAGNOSIS — M25552 Pain in left hip: Secondary | ICD-10-CM | POA: Diagnosis not present

## 2016-05-16 DIAGNOSIS — S3992XA Unspecified injury of lower back, initial encounter: Secondary | ICD-10-CM | POA: Diagnosis not present

## 2016-05-16 NOTE — Assessment & Plan Note (Signed)
Fall, injury w/ LEFT  shoulder, back and hip pain: Will get a x-ray of the lumbar spine and left hip. If pain persists will also get a shoulder x-ray. Pain control with Tylenol up to 3000 mg daily She is to have a right hip replacement in few weeks, will notify Dr. Berenice Primas, her orthopedic doctor of this issue. The reason she fell is because the left leg buckled, I offered physical therapy for strengthening of her lower extremity muscles, declined

## 2016-05-16 NOTE — Patient Instructions (Signed)
  STOP BY THE FIRST FLOOR:  get the XR    Okay to take Tylenol up to 1000 mg 3 times a day as needed   Please notify your orthopedic surgeon Dr. Berenice Primas about the fall and pain

## 2016-05-16 NOTE — Progress Notes (Signed)
Pre visit review using our clinic review tool, if applicable. No additional management support is needed unless otherwise documented below in the visit note. 

## 2016-05-16 NOTE — Progress Notes (Signed)
Subjective:    Patient ID: [name], female    DOB: 1930/06/19, 82 y.o.   MRN: [id number]  DOS:  05/16/2016 Type of visit - description : acute Interval history: Patient was getting off the shower, L leg buckled and fell on Saturday 7/8, landed on her left side, currently with pain at the L posterior shoulder, L posterior hip and lumbar  back . Denies syncope, head injury, loss of consciousness or neck pain. Has not taken anything except Tylenol as needed She is concerned because she has an upcoming surgery  Review of Systems Denies knee pain or swelling prior or after the accident. No headache, nausea or vomiting.   Past Medical History  Diagnosis Date  . Osteopenia   . Abnormal breast exam 2004    MMG neg (eval by surgeon neg)  . Hx SBO 2004 , 2007, 2012     exploratory surgery 9-12   . Iritis 2005  . Type II diabetes mellitus ([place])   . Anemia   . History of blood transfusion     "related to a surgery, I think"  . Osteoarthritis   . Arthritis     "knees" (07/11/2014)  . SBO (small bowel obstruction) ([place]) 07-2014    Past Surgical History  Procedure Laterality Date  . Total knee arthroplasty Right 10/2010  . Exploratory laparotomy  07/2011    SBO  . Appendectomy    . Total abdominal hysterectomy  1970's    for bleeding, no cancer  . Dilation and curettage of uterus    . Cataract extraction Bilateral     Social History   Social History  . Marital Status: Widowed    Spouse Name: N/A  . Number of Children: 2  . Years of Education: N/A   Occupational History  . Has her own business     makes aprons    Social History Main Topics  . Smoking status: Never Smoker   . Smokeless tobacco: Never Used  . Alcohol Use: No  . Drug Use: No  . Sexual Activity: No   Other Topics Concern  . Not on file   Social History Narrative   Lives by herself, her family is in [place], lost 2 sisters                  Medication List       This list is accurate  as of: 05/16/16  4:04 PM.  Always use your most recent med list.               aspirin 81 MG tablet  Take 81 mg by mouth daily.     CENTRUM SILVER PO  Take 1 capsule by mouth daily.     metFORMIN 500 MG tablet  Commonly known as:  GLUCOPHAGE  Take 1 tablet (500 mg total) by mouth 2 (two) times daily with a meal.     pravastatin 10 MG tablet  Commonly known as:  PRAVACHOL  Take 1 tablet (10 mg total) by mouth at bedtime.           Objective:   Physical Exam BP 122/62 mmHg  Pulse 92  Temp(Src) 98 F (36.7 C) (Oral)  Ht 5\' 5"  (1.651 m)  Wt 187 lb 8 oz (85.049 kg)  BMI 31.20 kg/m2  SpO2 98% General:   Well developed, well nourished . NAD.  HEENT:  Normocephalic . Face symmetric, atraumatic MSK: Neck no TTP Lumbar spine slightly TTP Shoulders: No deformities, slightly  TTP at the left deltoid area, range of motion seems satisfactory and symmetric. Hips: Right normal except for slightly decreased range of motion, left with a slightly decreased range of motion and some pain with flexion. Walks with moderate difficulty and assisted by a cane Skin: Not pale. Not jaundice Neurologic:  alert & oriented X3.  Speech normal, strength symmetric Psych--  Cognition and judgment appear intact.  Cooperative with normal attention span and concentration.  Behavior appropriate. No anxious or depressed appearing.      Assessment & Plan:   Assessment DM  hyperlipidemia Osteopenia-- dexa 05-2015 T score -1.4 Osteoarthritis Iritis Multiple SBO, exploratory surgery 2012  PLAN: Fall, injury w/ LEFT  shoulder, back and hip pain: Will get a x-ray of the lumbar spine and left hip. If pain persists will also get a shoulder x-ray. Pain control with Tylenol up to 3000 mg daily She is to have a right hip replacement in few weeks, will notify Dr. Berenice Primas, her orthopedic doctor of this issue. The reason she fell is because the left leg buckled, I offered physical therapy for  strengthening of her lower extremity muscles, declined

## 2016-06-12 ENCOUNTER — Ambulatory Visit (HOSPITAL_COMMUNITY)
Admission: RE | Admit: 2016-06-12 | Discharge: 2016-06-12 | Disposition: A | Discharge status: Home / Self Care | Payer: Medicare Other | Source: Ambulatory Visit | Attending: Orthopedic Surgery | Admitting: Orthopedic Surgery

## 2016-06-12 ENCOUNTER — Encounter (HOSPITAL_COMMUNITY): Payer: Self-pay

## 2016-06-12 ENCOUNTER — Encounter (HOSPITAL_COMMUNITY)
Admission: RE | Admit: 2016-06-12 | Discharge: 2016-06-12 | Disposition: A | Discharge status: Home / Self Care | Payer: Medicare Other | Source: Ambulatory Visit | Attending: Orthopedic Surgery | Admitting: Orthopedic Surgery

## 2016-06-12 DIAGNOSIS — Z01818 Encounter for other preprocedural examination: Secondary | ICD-10-CM | POA: Insufficient documentation

## 2016-06-12 DIAGNOSIS — Z0183 Encounter for blood typing: Secondary | ICD-10-CM | POA: Insufficient documentation

## 2016-06-12 DIAGNOSIS — Z01812 Encounter for preprocedural laboratory examination: Secondary | ICD-10-CM | POA: Insufficient documentation

## 2016-06-12 DIAGNOSIS — M1611 Unilateral primary osteoarthritis, right hip: Secondary | ICD-10-CM | POA: Insufficient documentation

## 2016-06-12 LAB — URINALYSIS, ROUTINE W REFLEX MICROSCOPIC
BILIRUBIN URINE: NEGATIVE
GLUCOSE, UA: NEGATIVE mg/dL
HGB URINE DIPSTICK: NEGATIVE
Ketones, ur: NEGATIVE mg/dL
Nitrite: NEGATIVE
PROTEIN: NEGATIVE mg/dL
SPECIFIC GRAVITY, URINE: 1.019 (ref 1.005–1.030)
pH: 7 (ref 5.0–8.0)

## 2016-06-12 LAB — CBC WITH DIFFERENTIAL/PLATELET
BASOS ABS: 0 10*3/uL (ref 0.0–0.1)
BASOS PCT: 0 %
Eosinophils Absolute: 0.1 10*3/uL (ref 0.0–0.7)
Eosinophils Relative: 2 %
HEMATOCRIT: 37.3 % (ref 36.0–46.0)
HEMOGLOBIN: 11.7 g/dL — AB (ref 12.0–15.0)
LYMPHS PCT: 49 %
Lymphs Abs: 3.1 10*3/uL (ref 0.7–4.0)
MCH: 27.3 pg (ref 26.0–34.0)
MCHC: 31.4 g/dL (ref 30.0–36.0)
MCV: 86.9 fL (ref 78.0–100.0)
MONOS PCT: 4 %
Monocytes Absolute: 0.3 10*3/uL (ref 0.1–1.0)
NEUTROS ABS: 2.8 10*3/uL (ref 1.7–7.7)
NEUTROS PCT: 45 %
Platelets: 186 10*3/uL (ref 150–400)
RBC: 4.29 MIL/uL (ref 3.87–5.11)
RDW: 13.9 % (ref 11.5–15.5)
WBC: 6.3 10*3/uL (ref 4.0–10.5)

## 2016-06-12 LAB — TYPE AND SCREEN
ABO/RH(D): A POS
ANTIBODY SCREEN: NEGATIVE

## 2016-06-12 LAB — SURGICAL PCR SCREEN
MRSA, PCR: NEGATIVE
STAPHYLOCOCCUS AUREUS: NEGATIVE

## 2016-06-12 LAB — COMPREHENSIVE METABOLIC PANEL
ALBUMIN: 3.9 g/dL (ref 3.5–5.0)
ALT: 12 U/L — ABNORMAL LOW (ref 14–54)
ANION GAP: 8 (ref 5–15)
AST: 18 U/L (ref 15–41)
Alkaline Phosphatase: 51 U/L (ref 38–126)
BUN: 14 mg/dL (ref 6–20)
CHLORIDE: 106 mmol/L (ref 101–111)
CO2: 26 mmol/L (ref 22–32)
Calcium: 9.5 mg/dL (ref 8.9–10.3)
Creatinine, Ser: 0.74 mg/dL (ref 0.44–1.00)
GFR calc Af Amer: >60 mL/min (ref >60)
GFR calc non Af Amer: >60 mL/min (ref >60)
GLUCOSE: 89 mg/dL (ref 65–99)
POTASSIUM: 4 mmol/L (ref 3.5–5.1)
Sodium: 140 mmol/L (ref 135–145)
Total Bilirubin: 0.9 mg/dL (ref 0.3–1.2)
Total Protein: 7.1 g/dL (ref 6.5–8.1)

## 2016-06-12 LAB — APTT: aPTT: 30 seconds (ref 24–36)

## 2016-06-12 LAB — PROTIME-INR
INR: 1.03
Prothrombin Time: 13.5 seconds (ref 11.4–15.2)

## 2016-06-12 LAB — URINE MICROSCOPIC-ADD ON: RBC / HPF: NONE SEEN RBC/hpf (ref 0–5)

## 2016-06-12 LAB — GLUCOSE, CAPILLARY: Glucose-Capillary: 132 mg/dL — ABNORMAL HIGH (ref 65–99)

## 2016-06-12 NOTE — Pre-Procedure Instructions (Signed)
    CANSAS ELL  06/12/2016      [id number] [place], [place] - [address] [place] [address] Phone: [phone] Fax: [phone]    Your procedure is scheduled on 06/23/16.  Report to [facility] Admitting at 1030 A.M.  Call this number if you have problems the morning of surgery:  [phone]   Remember:  Do not eat food or drink liquids after midnight.  Take these medicines the morning of surgery with A SIP OF WATER --none   Do not wear jewelry, make-up or nail polish.  Do not wear lotions, powders, or perfumes.  You may wear deoderant.  Do not shave 48 hours prior to surgery.  Men may shave face and neck.  Do not bring valuables to the hospital.  [facility] is not responsible for any belongings or valuables.  Contacts, dentures or bridgework may not be worn into surgery.  Leave your suitcase in the car.  After surgery it may be brought to your room.  For patients admitted to the hospital, discharge time will be determined by your treatment team.  Patients discharged the day of surgery will not be allowed to drive home.   Name and phone number of your driver: Special instructions: Do not take any aspirin,anti-inflammatories,vitamins,or herbal supplements 5-7 days prior to surgery.  Please read over the following fact sheets that you were given. MRSA Information

## 2016-06-22 MED ORDER — CEFAZOLIN SODIUM-DEXTROSE 2-4 GM/100ML-% IV SOLN
2.0000 g | INTRAVENOUS | Status: AC
  Administered 2016-06-23: 2 g via INTRAVENOUS
  Filled 2016-06-22: qty 100

## 2016-06-22 NOTE — H&P (Signed)
TOTAL HIP ADMISSION H&P  Patient is admitted for right total hip arthroplasty.  Subjective:  Chief Complaint: right hip pain  HPI: [name], 82 y.o. female, has a history of pain and functional disability in the right hip(s) due to arthritis and patient has failed non-surgical conservative treatments for greater than 12 weeks to include NSAID's and/or analgesics, corticosteriod injections, flexibility and strengthening excercises, weight reduction as appropriate and activity modification.  Onset of symptoms was gradual starting 5 years ago with gradually worsening course since that time.The patient noted no past surgery on the right hip(s).  Patient currently rates pain in the right hip at 8 out of 10 with activity. Patient has night pain, worsening of pain with activity and weight bearing, trendelenberg gait, pain that interfers with activities of daily living, pain with passive range of motion and joint swelling. Patient has evidence of periarticular osteophytes and joint space narrowing by imaging studies. This condition presents safety issues increasing the risk of falls. This patient has had failure of all reasonable conservative care.  There is no current active infection.  Patient Active Problem List   Diagnosis Date Noted  . PCP NOTES >>>>>>>>>>>. 05/02/2016  . Diabetes mellitus without complication ([place]) 63/78/5885  . UTI (lower urinary tract infection) 12/07/2015  . SBO (small bowel obstruction) ([place]) 12/07/2015  . Right hip pain 09/04/2015  . Left-sided tinnitus 05/23/2015  . Neuropathy ([place]) 05/23/2015  . Right shoulder pain 04/26/2015  . Dizziness 04/24/2015  . Facial pain syndrome 10/23/2014  . Pulmonary nodules 07/24/2014  . Small bowel obstruction ([place]) 07/11/2014  . Special screening for malignant neoplasms, colon 04/18/2013  . Annual physical exam 08/27/2011  . ANEMIA 11/13/2010  . Tinnitus 08/26/2010  . SEBORRHEIC KERATOSIS 05/17/2010  . Dyslipidemia 04/09/2009   . Osteoarthritis 12/21/2008  . DMII (diabetes mellitus, type 2) ([place]) 12/01/2007  . GERD 02/11/2007  . Osteopenia 02/11/2007   Past Medical History:  Diagnosis Date  . Abnormal breast exam 2004   MMG neg (eval by surgeon neg)  . Anemia   . Arthritis    "knees" (07/11/2014)  . History of blood transfusion    "related to a surgery, I think"  . Hx SBO 2004 , 2007, 2012    exploratory surgery 9-12   . Iritis 2005  . Osteoarthritis   . Osteopenia   . SBO (small bowel obstruction) ([place]) 07-2014  . Type II diabetes mellitus ([place])     Past Surgical History:  Procedure Laterality Date  . APPENDECTOMY    . CATARACT EXTRACTION Bilateral   . DILATION AND CURETTAGE OF UTERUS    . EXPLORATORY LAPAROTOMY  07/2011   SBO  . JOINT REPLACEMENT    . TOTAL ABDOMINAL HYSTERECTOMY  1970's   for bleeding, no cancer  . TOTAL KNEE ARTHROPLASTY Right 10/2010    No prescriptions prior to admission.   Allergies  Allergen Reactions  . Lipitor [Atorvastatin] Other (See Comments)    tinnitus  . Zocor [Simvastatin] Other (See Comments)    Ear ringing    Social History  Substance Use Topics  . Smoking status: Never Smoker  . Smokeless tobacco: Never Used  . Alcohol use No    Family History  Problem Relation Age of Onset  . Diabetes Father   . Breast cancer Sister   . Sudden death Sister   . Heart attack Neg Hx   . Colon cancer Neg Hx      ROS ROS: I have reviewed the patient's review of  systems thoroughly and there are no positive responses as relates to the HPI. Objective:  Physical Exam  Vital signs in last 24 hours:   Well-developed well-nourished patient in no acute distress. Alert and oriented x3 HEENT:within normal limits Cardiac: Regular rate and rhythm Pulmonary: Lungs clear to auscultation Abdomen: Soft and nontender.  Normal active bowel sounds  Musculoskeletal: right hip: Limited range of motion.  Pain with internal rotation.  Pain with flexion.  Neurovascularly intact  distally.) Labs: Recent Results (from the past 2160 hour(s))  Lipid panel     Status: Abnormal   Collection Time: 05/12/16 10:14 AM  Result Value Ref Range   Cholesterol 202 (H) 0 - 200 mg/dL    Comment: ATP III Classification       Desirable:  < 200 mg/dL               Borderline High:  200 - 239 mg/dL          High:  > = 240 mg/dL   Triglycerides 111.0 0.0 - 149.0 mg/dL    Comment: Normal:  <150 mg/dLBorderline High:  150 - 199 mg/dL   HDL 52.10 >39.00 mg/dL   VLDL 22.2 0.0 - 40.0 mg/dL   LDL Cholesterol 128 (H) 0 - 99 mg/dL   Total CHOL/HDL Ratio 4     Comment:                Men          Women1/2 Average Risk     3.4          3.3Average Risk          5.0          4.42X Average Risk          9.6          7.13X Average Risk          15.0          11.0                       NonHDL 150.10     Comment: NOTE:  Non-HDL goal should be 30 mg/dL higher than patient's LDL goal (i.e. LDL goal of < 70 mg/dL, would have non-HDL goal of < 100 mg/dL)  Hemoglobin A1c     Status: None   Collection Time: 05/12/16 10:14 AM  Result Value Ref Range   Hgb A1c MFr Bld 6.3 4.6 - 6.5 %    Comment: Glycemic Control Guidelines for People with Diabetes:Non Diabetic:  <6%Goal of Therapy: <7%Additional Action Suggested:  >8%   Urine Microalbumin w/creat. ratio     Status: None   Collection Time: 05/12/16 10:14 AM  Result Value Ref Range   Microalb, Ur 1.2 0.0 - 1.9 mg/dL   Creatinine,U 149.6 mg/dL   Microalb Creat Ratio 0.8 0.0 - 30.0 mg/g  Glucose, capillary     Status: Abnormal   Collection Time: 06/12/16 10:30 AM  Result Value Ref Range   Glucose-Capillary 132 (H) 65 - 99 mg/dL  Surgical pcr screen     Status: None   Collection Time: 06/12/16 11:02 AM  Result Value Ref Range   MRSA, PCR NEGATIVE NEGATIVE   Staphylococcus aureus NEGATIVE NEGATIVE    Comment:        The Xpert SA Assay (FDA approved for NASAL specimens in patients over 33 years of age), is one component of a comprehensive  surveillance program.  Test performance has  been validated by [facility] for patients greater than or equal to 37 year old. It is not intended to diagnose infection nor to guide or monitor treatment.   Urinalysis, Routine w reflex microscopic (not at [facility])     Status: Abnormal   Collection Time: 06/12/16 11:02 AM  Result Value Ref Range   Color, Urine YELLOW YELLOW   APPearance CLEAR CLEAR   Specific Gravity, Urine 1.019 1.005 - 1.030   pH 7.0 5.0 - 8.0   Glucose, UA NEGATIVE NEGATIVE mg/dL   Hgb urine dipstick NEGATIVE NEGATIVE   Bilirubin Urine NEGATIVE NEGATIVE   Ketones, ur NEGATIVE NEGATIVE mg/dL   Protein, ur NEGATIVE NEGATIVE mg/dL   Nitrite NEGATIVE NEGATIVE   Leukocytes, UA MODERATE (A) NEGATIVE  Urine microscopic-add on     Status: Abnormal   Collection Time: 06/12/16 11:02 AM  Result Value Ref Range   Squamous Epithelial / LPF 6-30 (A) NONE SEEN   WBC, UA 0-5 0 - 5 WBC/hpf   RBC / HPF NONE SEEN 0 - 5 RBC/hpf   Bacteria, UA RARE (A) NONE SEEN  APTT     Status: None   Collection Time: 06/12/16 11:03 AM  Result Value Ref Range   aPTT 30 24 - 36 seconds  CBC WITH DIFFERENTIAL     Status: Abnormal   Collection Time: 06/12/16 11:03 AM  Result Value Ref Range   WBC 6.3 4.0 - 10.5 K/uL   RBC 4.29 3.87 - 5.11 MIL/uL   Hemoglobin 11.7 (L) 12.0 - 15.0 g/dL   HCT 37.3 36.0 - 46.0 %   MCV 86.9 78.0 - 100.0 fL   MCH 27.3 26.0 - 34.0 pg   MCHC 31.4 30.0 - 36.0 g/dL   RDW 13.9 11.5 - 15.5 %   Platelets 186 150 - 400 K/uL   Neutrophils Relative % 45 %   Neutro Abs 2.8 1.7 - 7.7 K/uL   Lymphocytes Relative 49 %   Lymphs Abs 3.1 0.7 - 4.0 K/uL   Monocytes Relative 4 %   Monocytes Absolute 0.3 0.1 - 1.0 K/uL   Eosinophils Relative 2 %   Eosinophils Absolute 0.1 0.0 - 0.7 K/uL   Basophils Relative 0 %   Basophils Absolute 0.0 0.0 - 0.1 K/uL  Comprehensive metabolic panel     Status: Abnormal   Collection Time: 06/12/16 11:03 AM  Result Value Ref Range   Sodium 140 135  - 145 mmol/L   Potassium 4.0 3.5 - 5.1 mmol/L   Chloride 106 101 - 111 mmol/L   CO2 26 22 - 32 mmol/L   Glucose, Bld 89 65 - 99 mg/dL   BUN 14 6 - 20 mg/dL   Creatinine, Ser 0.74 0.44 - 1.00 mg/dL   Calcium 9.5 8.9 - 10.3 mg/dL   Total Protein 7.1 6.5 - 8.1 g/dL   Albumin 3.9 3.5 - 5.0 g/dL   AST 18 15 - 41 U/L   ALT 12 (L) 14 - 54 U/L   Alkaline Phosphatase 51 38 - 126 U/L   Total Bilirubin 0.9 0.3 - 1.2 mg/dL   GFR calc non Af Amer >60 >60 mL/min   GFR calc Af Amer >60 >60 mL/min    Comment: (NOTE) The eGFR has been calculated using the CKD EPI equation. This calculation has not been validated in all clinical situations. eGFR's persistently <60 mL/min signify possible Chronic Kidney Disease.    Anion gap 8 5 - 15  Protime-INR     Status: None  Collection Time: 06/12/16 11:03 AM  Result Value Ref Range   Prothrombin Time 13.5 11.4 - 15.2 seconds   INR 1.03   Type and screen Order type and screen if day of surgery is less than 15 days from draw of preadmission visit or order morning of surgery if day of surgery is greater than 6 days from preadmission visit.     Status: None   Collection Time: 06/12/16 11:10 AM  Result Value Ref Range   ABO/RH(D) A POS    Antibody Screen NEG    Sample Expiration 06/26/2016    Extend sample reason NO TRANSFUSIONS OR PREGNANCY IN THE PAST 3 MONTHS     Estimated body mass index is 31.9 kg/m as calculated from the following:   Height as of 06/12/16: 5' 5"  (1.651 m).   Weight as of 06/12/16: 87 kg (191 lb 11.2 oz).   Imaging Review Plain radiographs demonstrate severe degenerative joint disease of the right hip(s). The bone quality appears to be fair for age and reported activity level.  Assessment/Plan:  End stage arthritis, right hip(s)  The patient history, physical examination, clinical judgement of the provider and imaging studies are consistent with end stage degenerative joint disease of the right hip(s) and total hip arthroplasty  is deemed medically necessary. The treatment options including medical management, injection therapy, arthroscopy and arthroplasty were discussed at length. The risks and benefits of total hip arthroplasty were presented and reviewed. The risks due to aseptic loosening, infection, stiffness, dislocation/subluxation,  thromboembolic complications and other imponderables were discussed.  The patient acknowledged the explanation, agreed to proceed with the plan and consent was signed. Patient is being admitted for inpatient treatment for surgery, pain control, PT, OT, prophylactic antibiotics, VTE prophylaxis, progressive ambulation and ADL's and discharge planning.The patient is planning to be discharged home with home health services

## 2016-06-23 ENCOUNTER — Inpatient Hospital Stay (HOSPITAL_COMMUNITY): Payer: Medicare Other

## 2016-06-23 ENCOUNTER — Inpatient Hospital Stay (HOSPITAL_COMMUNITY): Payer: Medicare Other | Admitting: Anesthesiology

## 2016-06-23 ENCOUNTER — Encounter (HOSPITAL_COMMUNITY): Payer: Self-pay | Admitting: Critical Care Medicine

## 2016-06-23 ENCOUNTER — Inpatient Hospital Stay (HOSPITAL_COMMUNITY)
Admission: RE | Admit: 2016-06-23 | Discharge: 2016-06-25 | DRG: 470 | Disposition: A | Discharge status: Skilled Nursing Facility | Payer: Medicare Other | Source: Ambulatory Visit | Attending: Orthopedic Surgery | Admitting: Orthopedic Surgery

## 2016-06-23 ENCOUNTER — Encounter (HOSPITAL_COMMUNITY)
Admission: RE | Disposition: A | Discharge status: Skilled Nursing Facility | Payer: Self-pay | Source: Ambulatory Visit | Attending: Orthopedic Surgery

## 2016-06-23 DIAGNOSIS — M8580 Other specified disorders of bone density and structure, unspecified site: Secondary | ICD-10-CM | POA: Diagnosis not present

## 2016-06-23 DIAGNOSIS — Z833 Family history of diabetes mellitus: Secondary | ICD-10-CM

## 2016-06-23 DIAGNOSIS — M1990 Unspecified osteoarthritis, unspecified site: Secondary | ICD-10-CM | POA: Diagnosis not present

## 2016-06-23 DIAGNOSIS — Z862 Personal history of diseases of the blood and blood-forming organs and certain disorders involving the immune mechanism: Secondary | ICD-10-CM | POA: Diagnosis not present

## 2016-06-23 DIAGNOSIS — Z8719 Personal history of other diseases of the digestive system: Secondary | ICD-10-CM | POA: Diagnosis not present

## 2016-06-23 DIAGNOSIS — D62 Acute posthemorrhagic anemia: Secondary | ICD-10-CM

## 2016-06-23 DIAGNOSIS — Z888 Allergy status to other drugs, medicaments and biological substances status: Secondary | ICD-10-CM

## 2016-06-23 DIAGNOSIS — M169 Osteoarthritis of hip, unspecified: Secondary | ICD-10-CM | POA: Diagnosis not present

## 2016-06-23 DIAGNOSIS — R928 Other abnormal and inconclusive findings on diagnostic imaging of breast: Secondary | ICD-10-CM | POA: Diagnosis not present

## 2016-06-23 DIAGNOSIS — M1611 Unilateral primary osteoarthritis, right hip: Secondary | ICD-10-CM | POA: Diagnosis not present

## 2016-06-23 DIAGNOSIS — M25559 Pain in unspecified hip: Secondary | ICD-10-CM | POA: Diagnosis not present

## 2016-06-23 DIAGNOSIS — K219 Gastro-esophageal reflux disease without esophagitis: Secondary | ICD-10-CM | POA: Diagnosis not present

## 2016-06-23 DIAGNOSIS — Z471 Aftercare following joint replacement surgery: Secondary | ICD-10-CM | POA: Diagnosis not present

## 2016-06-23 DIAGNOSIS — E1140 Type 2 diabetes mellitus with diabetic neuropathy, unspecified: Secondary | ICD-10-CM | POA: Diagnosis not present

## 2016-06-23 DIAGNOSIS — Z96641 Presence of right artificial hip joint: Secondary | ICD-10-CM | POA: Diagnosis not present

## 2016-06-23 DIAGNOSIS — Z419 Encounter for procedure for purposes other than remedying health state, unspecified: Secondary | ICD-10-CM

## 2016-06-23 DIAGNOSIS — Z96651 Presence of right artificial knee joint: Secondary | ICD-10-CM | POA: Diagnosis not present

## 2016-06-23 DIAGNOSIS — M25551 Pain in right hip: Secondary | ICD-10-CM | POA: Diagnosis present

## 2016-06-23 DIAGNOSIS — M170 Bilateral primary osteoarthritis of knee: Secondary | ICD-10-CM | POA: Diagnosis not present

## 2016-06-23 DIAGNOSIS — E119 Type 2 diabetes mellitus without complications: Secondary | ICD-10-CM | POA: Diagnosis not present

## 2016-06-23 DIAGNOSIS — E785 Hyperlipidemia, unspecified: Secondary | ICD-10-CM | POA: Diagnosis present

## 2016-06-23 HISTORY — PX: TOTAL HIP ARTHROPLASTY: SHX124

## 2016-06-23 LAB — GLUCOSE, CAPILLARY
GLUCOSE-CAPILLARY: 108 mg/dL — AB (ref 65–99)
Glucose-Capillary: 100 mg/dL — ABNORMAL HIGH (ref 65–99)
Glucose-Capillary: 170 mg/dL — ABNORMAL HIGH (ref 65–99)

## 2016-06-23 SURGERY — ARTHROPLASTY, HIP, TOTAL, ANTERIOR APPROACH
Anesthesia: Spinal | Site: Hip | Laterality: Right

## 2016-06-23 MED ORDER — SUCCINYLCHOLINE CHLORIDE 200 MG/10ML IV SOSY
PREFILLED_SYRINGE | INTRAVENOUS | Status: AC
  Filled 2016-06-23: qty 10

## 2016-06-23 MED ORDER — BUPIVACAINE-EPINEPHRINE (PF) 0.25% -1:200000 IJ SOLN
INTRAMUSCULAR | Status: AC
  Filled 2016-06-23: qty 60

## 2016-06-23 MED ORDER — ROCURONIUM BROMIDE 10 MG/ML (PF) SYRINGE
PREFILLED_SYRINGE | INTRAVENOUS | Status: AC
  Filled 2016-06-23: qty 10

## 2016-06-23 MED ORDER — FENTANYL CITRATE (PF) 100 MCG/2ML IJ SOLN
INTRAMUSCULAR | Status: AC
  Filled 2016-06-23: qty 2

## 2016-06-23 MED ORDER — ROCURONIUM BROMIDE 100 MG/10ML IV SOLN: INTRAVENOUS | Status: DC | PRN

## 2016-06-23 MED ORDER — BUPIVACAINE-EPINEPHRINE (PF) 0.25% -1:200000 IJ SOLN
INTRAMUSCULAR | Status: DC | PRN
  Administered 2016-06-23: 40 mL

## 2016-06-23 MED ORDER — DEXAMETHASONE SODIUM PHOSPHATE 10 MG/ML IJ SOLN
INTRAMUSCULAR | Status: AC
  Filled 2016-06-23: qty 1

## 2016-06-23 MED ORDER — FENTANYL CITRATE (PF) 100 MCG/2ML IJ SOLN
25.0000 ug | INTRAMUSCULAR | Status: DC | PRN
  Administered 2016-06-23 (×2): 25 ug via INTRAVENOUS
  Administered 2016-06-23 (×2): 50 ug via INTRAVENOUS

## 2016-06-23 MED ORDER — BUPIVACAINE LIPOSOME 1.3 % IJ SUSP
INTRAMUSCULAR | Status: DC | PRN
  Administered 2016-06-23: 20 mL

## 2016-06-23 MED ORDER — DIPHENHYDRAMINE HCL 12.5 MG/5ML PO ELIX: 12.5000 mg | ORAL_SOLUTION | ORAL | Status: DC | PRN

## 2016-06-23 MED ORDER — OXYCODONE HCL 5 MG PO TABS
5.0000 mg | ORAL_TABLET | ORAL | Status: DC | PRN
  Administered 2016-06-24 – 2016-06-25 (×6): 10 mg via ORAL
  Filled 2016-06-23 (×7): qty 2

## 2016-06-23 MED ORDER — INSULIN ASPART 100 UNIT/ML ~~LOC~~ SOLN
0.0000 [IU] | Freq: Three times a day (TID) | SUBCUTANEOUS | Status: DC
  Administered 2016-06-24 – 2016-06-25 (×2): 2 [IU] via SUBCUTANEOUS
  Administered 2016-06-25: 3 [IU] via SUBCUTANEOUS

## 2016-06-23 MED ORDER — BISACODYL 5 MG PO TBEC: 5.0000 mg | DELAYED_RELEASE_TABLET | Freq: Every day | ORAL | Status: DC | PRN

## 2016-06-23 MED ORDER — METHOCARBAMOL 500 MG PO TABS
500.0000 mg | ORAL_TABLET | Freq: Four times a day (QID) | ORAL | Status: DC | PRN
  Administered 2016-06-24 – 2016-06-25 (×3): 500 mg via ORAL
  Filled 2016-06-23 (×3): qty 1

## 2016-06-23 MED ORDER — MIDAZOLAM HCL 5 MG/5ML IJ SOLN
INTRAMUSCULAR | Status: DC | PRN
  Administered 2016-06-23 (×2): 0.5 mg via INTRAVENOUS

## 2016-06-23 MED ORDER — SUCCINYLCHOLINE CHLORIDE 200 MG/10ML IV SOSY
PREFILLED_SYRINGE | INTRAVENOUS | Status: DC | PRN
  Administered 2016-06-23: 80 mg via INTRAVENOUS

## 2016-06-23 MED ORDER — HYDROCODONE-ACETAMINOPHEN 7.5-325 MG PO TABS: 1.0000 | ORAL_TABLET | Freq: Once | ORAL | Status: DC | PRN

## 2016-06-23 MED ORDER — METFORMIN HCL 500 MG PO TABS
500.0000 mg | ORAL_TABLET | Freq: Two times a day (BID) | ORAL | Status: DC
  Administered 2016-06-24 – 2016-06-25 (×2): 500 mg via ORAL
  Filled 2016-06-23 (×2): qty 1

## 2016-06-23 MED ORDER — ONDANSETRON HCL 4 MG/2ML IJ SOLN: 4.0000 mg | Freq: Once | INTRAMUSCULAR | Status: DC | PRN

## 2016-06-23 MED ORDER — METHOCARBAMOL 1000 MG/10ML IJ SOLN
500.0000 mg | Freq: Four times a day (QID) | INTRAVENOUS | Status: DC | PRN
  Filled 2016-06-23: qty 5

## 2016-06-23 MED ORDER — 0.9 % SODIUM CHLORIDE (POUR BTL) OPTIME
TOPICAL | Status: DC | PRN
  Administered 2016-06-23: 1000 mL

## 2016-06-23 MED ORDER — SUGAMMADEX SODIUM 200 MG/2ML IV SOLN
INTRAVENOUS | Status: AC
  Filled 2016-06-23: qty 2

## 2016-06-23 MED ORDER — ASPIRIN EC 325 MG PO TBEC
325.0000 mg | DELAYED_RELEASE_TABLET | Freq: Two times a day (BID) | ORAL | Status: DC
  Administered 2016-06-24 – 2016-06-25 (×3): 325 mg via ORAL
  Filled 2016-06-23 (×3): qty 1

## 2016-06-23 MED ORDER — PRAVASTATIN SODIUM 20 MG PO TABS
10.0000 mg | ORAL_TABLET | Freq: Every day | ORAL | Status: DC
  Administered 2016-06-24: 10 mg via ORAL
  Filled 2016-06-23: qty 1

## 2016-06-23 MED ORDER — SODIUM CHLORIDE 0.9 % IV SOLN
INTRAVENOUS | Status: DC
  Administered 2016-06-23: 21:00:00 via INTRAVENOUS

## 2016-06-23 MED ORDER — LIDOCAINE 2% (20 MG/ML) 5 ML SYRINGE
INTRAMUSCULAR | Status: AC
  Filled 2016-06-23: qty 5

## 2016-06-23 MED ORDER — HYDROCODONE-ACETAMINOPHEN 5-325 MG PO TABS: 1.0000 | ORAL_TABLET | Freq: Four times a day (QID) | ORAL | 0 refills | Status: DC | PRN

## 2016-06-23 MED ORDER — ONDANSETRON HCL 4 MG PO TABS: 4.0000 mg | ORAL_TABLET | Freq: Four times a day (QID) | ORAL | Status: DC | PRN

## 2016-06-23 MED ORDER — HYDROMORPHONE HCL 1 MG/ML IJ SOLN
0.5000 mg | INTRAMUSCULAR | Status: DC | PRN
  Administered 2016-06-23 (×2): 0.5 mg via INTRAVENOUS

## 2016-06-23 MED ORDER — PROPOFOL 10 MG/ML IV BOLUS
INTRAVENOUS | Status: DC | PRN
  Administered 2016-06-23: 120 mg via INTRAVENOUS

## 2016-06-23 MED ORDER — PHENYLEPHRINE HCL 10 MG/ML IJ SOLN
INTRAMUSCULAR | Status: DC | PRN
  Administered 2016-06-23: 25 ug/min via INTRAVENOUS

## 2016-06-23 MED ORDER — TRANEXAMIC ACID 1000 MG/10ML IV SOLN
1000.0000 mg | Freq: Once | INTRAVENOUS | Status: AC
  Administered 2016-06-23: 1000 mg via INTRAVENOUS
  Filled 2016-06-23: qty 10

## 2016-06-23 MED ORDER — DOCUSATE SODIUM 100 MG PO CAPS
100.0000 mg | ORAL_CAPSULE | Freq: Two times a day (BID) | ORAL | Status: DC
  Administered 2016-06-24 – 2016-06-25 (×3): 100 mg via ORAL
  Filled 2016-06-23 (×3): qty 1

## 2016-06-23 MED ORDER — LACTATED RINGERS IV SOLN
INTRAVENOUS | Status: DC
  Administered 2016-06-23 (×2): via INTRAVENOUS

## 2016-06-23 MED ORDER — ONDANSETRON HCL 4 MG/2ML IJ SOLN
INTRAMUSCULAR | Status: AC
  Filled 2016-06-23: qty 2

## 2016-06-23 MED ORDER — MAGNESIUM CITRATE PO SOLN: 1.0000 | Freq: Once | ORAL | Status: DC | PRN

## 2016-06-23 MED ORDER — FENTANYL CITRATE (PF) 100 MCG/2ML IJ SOLN
INTRAMUSCULAR | Status: DC | PRN
  Administered 2016-06-23: 25 ug via INTRAVENOUS
  Administered 2016-06-23 (×2): 50 ug via INTRAVENOUS
  Administered 2016-06-23: 25 ug via INTRAVENOUS
  Administered 2016-06-23: 50 ug via INTRAVENOUS

## 2016-06-23 MED ORDER — PHENYLEPHRINE 40 MCG/ML (10ML) SYRINGE FOR IV PUSH (FOR BLOOD PRESSURE SUPPORT)
PREFILLED_SYRINGE | INTRAVENOUS | Status: AC
  Filled 2016-06-23: qty 10

## 2016-06-23 MED ORDER — BUPIVACAINE LIPOSOME 1.3 % IJ SUSP
20.0000 mL | INTRAMUSCULAR | Status: DC
  Filled 2016-06-23 (×2): qty 20

## 2016-06-23 MED ORDER — PHENYLEPHRINE HCL 10 MG/ML IJ SOLN
INTRAMUSCULAR | Status: DC | PRN
  Administered 2016-06-23 (×2): 80 ug via INTRAVENOUS
  Administered 2016-06-23: 40 ug via INTRAVENOUS

## 2016-06-23 MED ORDER — ALBUMIN HUMAN 5 % IV SOLN
INTRAVENOUS | Status: DC | PRN
  Administered 2016-06-23: 14:00:00 via INTRAVENOUS

## 2016-06-23 MED ORDER — ROCURONIUM BROMIDE 10 MG/ML (PF) SYRINGE
PREFILLED_SYRINGE | INTRAVENOUS | Status: DC | PRN
  Administered 2016-06-23: 10 mg via INTRAVENOUS
  Administered 2016-06-23: 30 mg via INTRAVENOUS
  Administered 2016-06-23 (×2): 10 mg via INTRAVENOUS

## 2016-06-23 MED ORDER — ALUM & MAG HYDROXIDE-SIMETH 200-200-20 MG/5ML PO SUSP: 30.0000 mL | ORAL | Status: DC | PRN

## 2016-06-23 MED ORDER — HYDROMORPHONE HCL 1 MG/ML IJ SOLN
0.5000 mg | INTRAMUSCULAR | Status: DC | PRN
  Administered 2016-06-23 – 2016-06-25 (×5): 1 mg via INTRAVENOUS
  Filled 2016-06-23 (×5): qty 1

## 2016-06-23 MED ORDER — ASPIRIN EC 325 MG PO TBEC: 325.0000 mg | DELAYED_RELEASE_TABLET | Freq: Two times a day (BID) | ORAL | 0 refills | Status: DC

## 2016-06-23 MED ORDER — CHLORHEXIDINE GLUCONATE 4 % EX LIQD: 60.0000 mL | Freq: Once | CUTANEOUS | Status: DC

## 2016-06-23 MED ORDER — ACETAMINOPHEN 650 MG RE SUPP: 650.0000 mg | Freq: Four times a day (QID) | RECTAL | Status: DC | PRN

## 2016-06-23 MED ORDER — ACETAMINOPHEN 325 MG PO TABS: 650.0000 mg | ORAL_TABLET | Freq: Four times a day (QID) | ORAL | Status: DC | PRN

## 2016-06-23 MED ORDER — HYDROMORPHONE HCL 1 MG/ML IJ SOLN
INTRAMUSCULAR | Status: AC
  Filled 2016-06-23: qty 1

## 2016-06-23 MED ORDER — BUPIVACAINE HCL (PF) 0.5 % IJ SOLN
INTRAMUSCULAR | Status: AC
  Filled 2016-06-23: qty 30

## 2016-06-23 MED ORDER — CEFAZOLIN SODIUM-DEXTROSE 2-4 GM/100ML-% IV SOLN
2.0000 g | Freq: Four times a day (QID) | INTRAVENOUS | Status: AC
  Administered 2016-06-23 – 2016-06-24 (×2): 2 g via INTRAVENOUS
  Filled 2016-06-23 (×2): qty 100

## 2016-06-23 MED ORDER — POLYETHYLENE GLYCOL 3350 17 G PO PACK: 17.0000 g | PACK | Freq: Every day | ORAL | Status: DC | PRN

## 2016-06-23 MED ORDER — MIDAZOLAM HCL 2 MG/2ML IJ SOLN
INTRAMUSCULAR | Status: AC
Start: 2016-06-23 — End: 2016-06-23
  Filled 2016-06-23: qty 2

## 2016-06-23 MED ORDER — ONDANSETRON HCL 4 MG/2ML IJ SOLN: 4.0000 mg | Freq: Four times a day (QID) | INTRAMUSCULAR | Status: DC | PRN

## 2016-06-23 MED ORDER — DEXAMETHASONE SODIUM PHOSPHATE 10 MG/ML IJ SOLN
INTRAMUSCULAR | Status: DC | PRN
  Administered 2016-06-23: 4 mg via INTRAVENOUS

## 2016-06-23 MED ORDER — ONDANSETRON HCL 4 MG/2ML IJ SOLN
INTRAMUSCULAR | Status: DC | PRN
Start: 2016-06-23 — End: 2016-06-23
  Administered 2016-06-23: 4 mg via INTRAVENOUS

## 2016-06-23 MED ORDER — SUCCINYLCHOLINE CHLORIDE 20 MG/ML IJ SOLN: INTRAMUSCULAR | Status: DC | PRN

## 2016-06-23 MED ORDER — KETOROLAC TROMETHAMINE 15 MG/ML IJ SOLN
7.5000 mg | Freq: Three times a day (TID) | INTRAMUSCULAR | Status: AC
  Administered 2016-06-23 – 2016-06-24 (×4): 7.5 mg via INTRAVENOUS
  Filled 2016-06-23 (×4): qty 1

## 2016-06-23 MED ORDER — SUGAMMADEX SODIUM 200 MG/2ML IV SOLN
INTRAVENOUS | Status: DC | PRN
  Administered 2016-06-23: 200 mg via INTRAVENOUS

## 2016-06-23 MED ORDER — TRANEXAMIC ACID 1000 MG/10ML IV SOLN
1000.0000 mg | INTRAVENOUS | Status: AC
  Administered 2016-06-23: 1000 mg via INTRAVENOUS
  Filled 2016-06-23 (×2): qty 10

## 2016-06-23 SURGICAL SUPPLY — 61 items
APL SKNCLS STERI-STRIP NONHPOA (GAUZE/BANDAGES/DRESSINGS) ×1
BENZOIN TINCTURE PRP APPL 2/3 (GAUZE/BANDAGES/DRESSINGS) ×3 IMPLANT
BLADE SAW SGTL 18X1.27X75 (BLADE) ×2 IMPLANT
BLADE SAW SGTL 18X1.27X75MM (BLADE) ×1
BLADE SURG ROTATE 9660 (MISCELLANEOUS) IMPLANT
BNDG COHESIVE 6X5 TAN STRL LF (GAUZE/BANDAGES/DRESSINGS) IMPLANT
BNDG GAUZE ELAST 4 BULKY (GAUZE/BANDAGES/DRESSINGS) IMPLANT
CAPT HIP TOTAL 2 ×2 IMPLANT
CELLS DAT CNTRL 66122 CELL SVR (MISCELLANEOUS) IMPLANT
CLOSURE STERI-STRIP 1/2X4 (GAUZE/BANDAGES/DRESSINGS) ×1
CLSR STERI-STRIP ANTIMIC 1/2X4 (GAUZE/BANDAGES/DRESSINGS) ×1 IMPLANT
COVER PERINEAL POST (MISCELLANEOUS) ×3 IMPLANT
COVER SURGICAL LIGHT HANDLE (MISCELLANEOUS) ×3 IMPLANT
DRAPE C-ARM 42X72 X-RAY (DRAPES) ×3 IMPLANT
DRAPE STERI IOBAN 125X83 (DRAPES) ×3 IMPLANT
DRAPE U-SHAPE 47X51 STRL (DRAPES) ×6 IMPLANT
DRSG AQUACEL AG ADV 3.5X10 (GAUZE/BANDAGES/DRESSINGS) ×3 IMPLANT
DURAPREP 26ML APPLICATOR (WOUND CARE) ×3 IMPLANT
ELECT BLADE 4.0 EZ CLEAN MEGAD (MISCELLANEOUS) ×3
ELECT CAUTERY BLADE 6.4 (BLADE) ×3 IMPLANT
ELECT REM PT RETURN 9FT ADLT (ELECTROSURGICAL) ×3
ELECTRODE BLDE 4.0 EZ CLN MEGD (MISCELLANEOUS) IMPLANT
ELECTRODE REM PT RTRN 9FT ADLT (ELECTROSURGICAL) ×1 IMPLANT
GAUZE XEROFORM 1X8 LF (GAUZE/BANDAGES/DRESSINGS) ×3 IMPLANT
GLOVE BIOGEL PI IND STRL 8 (GLOVE) ×2 IMPLANT
GLOVE BIOGEL PI INDICATOR 8 (GLOVE) ×4
GLOVE ECLIPSE 7.5 STRL STRAW (GLOVE) ×8 IMPLANT
GOWN STRL REUS W/ TWL LRG LVL3 (GOWN DISPOSABLE) ×2 IMPLANT
GOWN STRL REUS W/ TWL XL LVL3 (GOWN DISPOSABLE) ×2 IMPLANT
GOWN STRL REUS W/TWL LRG LVL3 (GOWN DISPOSABLE)
GOWN STRL REUS W/TWL XL LVL3 (GOWN DISPOSABLE) ×9
HOOD PEEL AWAY FACE SHEILD DIS (HOOD) ×6 IMPLANT
KIT BASIN OR (CUSTOM PROCEDURE TRAY) ×3 IMPLANT
KIT ROOM TURNOVER OR (KITS) ×3 IMPLANT
MANIFOLD NEPTUNE II (INSTRUMENTS) ×3 IMPLANT
NDL SPNL 22GX3.5 QUINCKE BK (NEEDLE) ×1 IMPLANT
NEEDLE SPNL 22GX3.5 QUINCKE BK (NEEDLE) ×3 IMPLANT
NS IRRIG 1000ML POUR BTL (IV SOLUTION) ×3 IMPLANT
PACK TOTAL JOINT (CUSTOM PROCEDURE TRAY) ×3 IMPLANT
PACK UNIVERSAL I (CUSTOM PROCEDURE TRAY) ×3 IMPLANT
PAD ARMBOARD 7.5X6 YLW CONV (MISCELLANEOUS) ×6 IMPLANT
RETRACTOR WND ALEXIS 18 MED (MISCELLANEOUS) IMPLANT
RTRCTR WOUND ALEXIS 18CM MED (MISCELLANEOUS)
RTRCTR WOUND ALEXIS 18CM SML (INSTRUMENTS) ×3
SAVER CELL AAL HAEMONETICS (INSTRUMENTS) ×1 IMPLANT
SPONGE LAP 18X18 X RAY DECT (DISPOSABLE) IMPLANT
STAPLER VISISTAT 35W (STAPLE) IMPLANT
SUT ETHIBOND NAB CT1 #1 30IN (SUTURE) ×6 IMPLANT
SUT MNCRL AB 3-0 PS2 18 (SUTURE) ×4 IMPLANT
SUT VIC AB 0 CT1 27 (SUTURE) ×6
SUT VIC AB 0 CT1 27XBRD ANBCTR (SUTURE) ×1 IMPLANT
SUT VIC AB 1 CT1 27 (SUTURE) ×6
SUT VIC AB 1 CT1 27XBRD ANBCTR (SUTURE) ×2 IMPLANT
SUT VIC AB 2-0 CT1 27 (SUTURE) ×6
SUT VIC AB 2-0 CT1 TAPERPNT 27 (SUTURE) ×1 IMPLANT
SYR 50ML LL SCALE MARK (SYRINGE) ×3 IMPLANT
TOWEL OR 17X24 6PK STRL BLUE (TOWEL DISPOSABLE) ×3 IMPLANT
TOWEL OR 17X26 10 PK STRL BLUE (TOWEL DISPOSABLE) ×3 IMPLANT
TRAY CATH 16FR W/PLASTIC CATH (SET/KITS/TRAYS/PACK) ×2 IMPLANT
TRAY FOLEY CATH 16FR SILVER (SET/KITS/TRAYS/PACK) ×2 IMPLANT
WATER STERILE IRR 1000ML POUR (IV SOLUTION) ×6 IMPLANT

## 2016-06-23 NOTE — Op Note (Deleted)
  The note originally documented on this encounter has been moved the the encounter in which it belongs.  

## 2016-06-23 NOTE — Transfer of Care (Signed)
Immediate Anesthesia Transfer of Care Note  Patient: [name]  Procedure(s) Performed: Procedure(s): TOTAL HIP ARTHROPLASTY ANTERIOR APPROACH (Right)  Patient Location: PACU  Anesthesia Type:General  Level of Consciousness: awake, alert , oriented and patient cooperative  Airway & Oxygen Therapy: Patient Spontanous Breathing and Patient connected to nasal cannula oxygen  Post-op Assessment: Report given to RN, Post -op Vital signs reviewed and stable and Patient moving all extremities  Post vital signs: Reviewed and stable  Last Vitals:  Vitals:   06/23/16 1100 06/23/16 1505  BP: 130/68   Pulse: 70   Temp: 37.2 C (P) 36.4 C    Last Pain:  Vitals:   06/23/16 1100  TempSrc: Oral  PainSc:       Patients Stated Pain Goal: 3 ([id number])  Complications: No apparent anesthesia complications

## 2016-06-23 NOTE — Anesthesia Postprocedure Evaluation (Signed)
Anesthesia Post Note  Patient: [name]  Procedure(s) Performed: Procedure(s) (LRB): TOTAL HIP ARTHROPLASTY ANTERIOR APPROACH (Right)  Patient location during evaluation: PACU Anesthesia Type: General Level of consciousness: awake and alert Pain management: pain level controlled Vital Signs Assessment: post-procedure vital signs reviewed and stable Respiratory status: spontaneous breathing, nonlabored ventilation, respiratory function stable and patient connected to nasal cannula oxygen Cardiovascular status: blood pressure returned to baseline and stable Postop Assessment: no signs of nausea or vomiting Anesthetic complications: no    Last Vitals:  Vitals:   06/23/16 1745 06/23/16 1800  BP:    Pulse: 87 90  Resp: 10 11  Temp:      Last Pain:  Vitals:   06/23/16 1735  TempSrc:   PainSc: Asleep                 Nilda Simmer

## 2016-06-23 NOTE — Brief Op Note (Signed)
06/23/2016  4:24 PM  PATIENT:  [name]  82 y.o. female  PRE-OPERATIVE DIAGNOSIS:  Osteoarthritis right hip  POST-OPERATIVE DIAGNOSIS:  Osteoarthritis right hip  PROCEDURE:  Procedure(s): TOTAL HIP ARTHROPLASTY ANTERIOR APPROACH (Right)  SURGEON:  Surgeon(s) and Role:    * Dorna Leitz, MD - Primary  PHYSICIAN ASSISTANT:   ASSISTANTS: bethune   ANESTHESIA:   general  EBL:  Total I/O In: 1450 [I.V.:1200; IV Piggyback:250] Out: 450 [Blood:450]  BLOOD ADMINISTERED:none  DRAINS: none   LOCAL MEDICATIONS USED:  MARCAINE    and OTHER experel  SPECIMEN:  No Specimen  DISPOSITION OF SPECIMEN:  N/A  COUNTS:  YES  TOURNIQUET:  * No tourniquets in log *  DICTATION: .Other Dictation: Dictation Number [id number]  PLAN OF CARE: Admit to inpatient   PATIENT DISPOSITION:  PACU - hemodynamically stable.   Delay start of Pharmacological VTE agent (>24hrs) due to surgical blood loss or risk of bleeding: no

## 2016-06-23 NOTE — Anesthesia Preprocedure Evaluation (Addendum)
Anesthesia Evaluation  Patient identified by MRN, date of birth, ID band Patient awake    Reviewed: Allergy & Precautions, H&P , NPO status , Patient's Chart, lab work & pertinent test results  History of Anesthesia Complications Negative for: history of anesthetic complications  Airway Mallampati: II  TM Distance: >3 FB Neck ROM: full    Dental no notable dental hx.    Pulmonary neg pulmonary ROS,    Pulmonary exam normal breath sounds clear to auscultation       Cardiovascular negative cardio ROS Normal cardiovascular exam Rhythm:regular Rate:Normal     Neuro/Psych negative neurological ROS     GI/Hepatic Neg liver ROS,   Endo/Other  diabetes  Renal/GU negative Renal ROS     Musculoskeletal  (+) Arthritis ,   Abdominal   Peds  Hematology negative hematology ROS (+) anemia ,   Anesthesia Other Findings   Reproductive/Obstetrics negative OB ROS                            Anesthesia Physical Anesthesia Plan  ASA: II  Anesthesia Plan: Spinal   Post-op Pain Management:    Induction:   Airway Management Planned:   Additional Equipment:   Intra-op Plan:   Post-operative Plan:   Informed Consent: I have reviewed the patients History and Physical, chart, labs and discussed the procedure including the risks, benefits and alternatives for the proposed anesthesia with the patient or authorized representative who has indicated his/her understanding and acceptance.   Dental Advisory Given  Plan Discussed with: Anesthesiologist, CRNA and Surgeon  Anesthesia Plan Comments:         Anesthesia Quick Evaluation

## 2016-06-23 NOTE — Anesthesia Procedure Notes (Signed)
Procedure Name: Intubation Date/Time: 06/23/2016 12:55 PM Performed by: Merrilyn Puma B Pre-anesthesia Checklist: Patient identified, Emergency Drugs available, Suction available, Patient being monitored and Timeout performed Patient Re-evaluated:Patient Re-evaluated prior to inductionOxygen Delivery Method: Circle system utilized Preoxygenation: Pre-oxygenation with 100% oxygen Intubation Type: IV induction Ventilation: Mask ventilation without difficulty Laryngoscope Size: Mac and 3 Grade View: Grade I Tube type: Oral Tube size: 7.0 mm Number of attempts: 1 Airway Equipment and Method: Stylet Placement Confirmation: breath sounds checked- equal and bilateral,  CO2 detector,  positive ETCO2 and ETT inserted through vocal cords under direct vision Secured at: 21 cm Tube secured with: Tape Dental Injury: Teeth and Oropharynx as per pre-operative assessment

## 2016-06-23 NOTE — Discharge Instructions (Signed)

## 2016-06-23 NOTE — Op Note (Signed)
NAMESTEPHIANE, BURLESON NO.:  [id number]  MEDICAL RECORD NO.:  [id number]  LOCATION:                               FACILITY:  [facility]  PHYSICIAN:  Alta Corning, M.D.   DATE OF BIRTH:  1930/07/27  DATE OF PROCEDURE: DATE OF DISCHARGE:                              OPERATIVE REPORT   She is an 82 year old female on Orthopedic Surgery Service.  PREOPERATIVE DIAGNOSIS:  End-stage degenerative joint disease, right hip with bone-on-bone change and night pain.  POSTOPERATIVE DIAGNOSIS:  End-stage degenerative joint disease, right hip with bone-on-bone change and night pain.  PROCEDURES: 1. Right total hip replacement with a Corail stem size 12, 52 mm     Gription cup, +4 neutral liner, and a 36-mm hip ball. 2. Interpretation of multiple intraoperative fluoroscopic images.  SURGEON:  Alta Corning, M.D.  ASSISTANT:  Gary Fleet, P.A.  ANESTHESIA:  General.  BRIEF HISTORY:  [name] is an 82 year old female with a history of significant complaints of right hip pain.  She had been treated conservatively for prolonged period of time.  After failure of conservative care, and the patient having night pain and a lot of activity pain, she was taken to the operating room for right total hip replacement.  DESCRIPTION OF PROCEDURE:  The patient was taken to the operating room. After adequate anesthesia was obtained with general anesthetic, the patient was placed supine on the operating room table.  She was then placed onto the Hana bed after being placed and boots.  Once this was done, the patient was prepped and draped in the usual sterile fashion. Following this, an incision was made for an anterior approach to the hip, subcutaneous cyst level of tensor fascia which was identified and it was divided and retractors were put in place above and below the neck after the tensor fascia, swept laterally once this was done, the capsule was opened and tagged.  The  attention was then turned towards the neck cut, where a provisional neck cut was made and the head and neck segment was removed.  Retractors put in place.  The hip was put on traction external rotation.  At this time, sequentially reamed to a level of 51 mm and a 52 mm Gription cup was hammered into place in 45 degrees of lateral opening and 30 degrees of anteversion.  Once this was done, attention was turned then and fluoro was used throughout to make sure that the reaming was appropriate and the shell was appropriately placed. Once this was completed, the attention was turned towards the stem side where the hip was placed into an externally rotated, and adducted position and retractors were put in place.  The hip was then sequentially rasped to a size 12 broach and the +0 hip ball, with a standard neck was put in place and the hips reduced.  Perfect symmetric leg lengths were achieved right on this, first reduction and the fit and fill of the stem was perfect.  At this point, the hip was brought back over into a down over position, we removed that trial, put in the final 12, put a +0 ball, and a final reduction was  undertaken.  A perfect fit for hip and leg lengths were symmetric and at this point, the capsule was closed with 1 Vicryl running, and the tensor fascia was closed with 0 Vicryl running, skin with 0 and 2-0 Vicryl, and 3-0 Monocryl subcuticular.  20 mL of Exparel in 40 mL Marcaine with epinephrine were instilled all throughout the wound in the area for postoperative anesthesia.  Sterile compressive dressing was applied.  The patient was taken to the recovery noted to be in satisfactory condition.  Estimated blood loss for procedure was about 400 mL with the final can be gotten from her anesthetic record.     Alta Corning, M.D.   ______________________________ Alta Corning, M.D.    Corliss Skains  D:  06/23/2016  T:  06/23/2016  Job:  [id number]

## 2016-06-24 ENCOUNTER — Encounter (HOSPITAL_COMMUNITY): Payer: Self-pay | Admitting: Orthopedic Surgery

## 2016-06-24 LAB — BASIC METABOLIC PANEL
ANION GAP: 9 (ref 5–15)
BUN: 13 mg/dL (ref 6–20)
CALCIUM: 8.5 mg/dL — AB (ref 8.9–10.3)
CO2: 25 mmol/L (ref 22–32)
Chloride: 104 mmol/L (ref 101–111)
Creatinine, Ser: 0.81 mg/dL (ref 0.44–1.00)
Glucose, Bld: 161 mg/dL — ABNORMAL HIGH (ref 65–99)
Potassium: 4 mmol/L (ref 3.5–5.1)
Sodium: 138 mmol/L (ref 135–145)

## 2016-06-24 LAB — GLUCOSE, CAPILLARY
GLUCOSE-CAPILLARY: 144 mg/dL — AB (ref 65–99)
Glucose-Capillary: 128 mg/dL — ABNORMAL HIGH (ref 65–99)
Glucose-Capillary: 256 mg/dL — ABNORMAL HIGH (ref 65–99)
Glucose-Capillary: 222 mg/dL — ABNORMAL HIGH (ref 65–99)

## 2016-06-24 LAB — CBC
HEMATOCRIT: 29.8 % — AB (ref 36.0–46.0)
Hemoglobin: 9.2 g/dL — ABNORMAL LOW (ref 12.0–15.0)
MCH: 26.5 pg (ref 26.0–34.0)
MCHC: 30.9 g/dL (ref 30.0–36.0)
MCV: 85.9 fL (ref 78.0–100.0)
PLATELETS: 180 10*3/uL (ref 150–400)
RBC: 3.47 MIL/uL — ABNORMAL LOW (ref 3.87–5.11)
RDW: 14 % (ref 11.5–15.5)
WBC: 10.3 10*3/uL (ref 4.0–10.5)

## 2016-06-24 NOTE — Progress Notes (Signed)
Subjective: 1 Day Post-Op Procedure(s) (LRB): TOTAL HIP ARTHROPLASTY ANTERIOR APPROACH (Right) Patient reports pain as moderate.  Taking by mouth and voiding okay. Positive flatus. Not out of bed yet.  Objective: Vital signs in last 24 hours: Temp:  [97.5 F (36.4 C)-99.1 F (37.3 C)] 99.1 F (37.3 C) (08/22 0639) Pulse Rate:  [70-102] 91 (08/22 0639) Resp:  [8-21] 15 (08/22 0639) BP: (107-146)/(48-76) 115/58 (08/22 0639) SpO2:  [94 %-100 %] 95 % (08/22 0639) Weight:  [86.6 kg (191 lb)] 86.6 kg (191 lb) (08/21 1100)  Intake/Output from previous day: 08/21 0701 - 08/22 0700 In: 2603.3 [P.O.:120; I.V.:2233.3; IV Piggyback:250] Out: [id number] [Urine:1000; Blood:450] Intake/Output this shift: No intake/output data recorded.   Recent Labs  06/24/16 0500  HGB 9.2*    Recent Labs  06/24/16 0500  WBC 10.3  RBC 3.47*  HCT 29.8*  PLT 180    Recent Labs  06/24/16 0500  NA 138  K 4.0  CL 104  CO2 25  BUN 13  CREATININE 0.81  GLUCOSE 161*  CALCIUM 8.5*   No results for input(s): LABPT, INR in the last 72 hours. Right hip exam: Neurovascular intact Sensation intact distally Intact pulses distally Dorsiflexion/Plantar flexion intact Incision: dressing C/D/I Compartment soft  Assessment/Plan: 1 Day Post-Op Procedure(s) (LRB): TOTAL HIP ARTHROPLASTY ANTERIOR APPROACH (Right) Plan: Up with physical therapy weightbearing as tolerated on right with no hip precautions. Aspirin 325 mg twice daily or DVT prophylaxis with SCDs. To skilled nursing facility in 1-2 days.  [place] G 06/24/2016, 8:56 AM

## 2016-06-24 NOTE — Evaluation (Signed)
Physical Therapy Evaluation Patient Details Name: [name] MRN: [id number] DOB: Sep 09, 1930 Today's Date: 06/24/2016   History of Present Illness  pt rpesents post R THR.  pt with hx of DM, Tinnitus, Dizziness, R TKR, and Neuropathies.    Clinical Impression  Pt very motivated to return to her active lifestyle, but was limited in her mobility today due to feeling lightheaded.  Pt plans on D/C to SNF for further rehab prior to returning to home alone.  Will continue to follow.      Follow Up Recommendations SNF    Equipment Recommendations  None recommended by PT    Recommendations for Other Services       Precautions / Restrictions Precautions Precautions: Fall Precaution Comments: No Hip Precautions. Restrictions Weight Bearing Restrictions: Yes RLE Weight Bearing: Weight bearing as tolerated      Mobility  Bed Mobility Overal bed mobility: Needs Assistance Bed Mobility: Supine to Sit     Supine to sit: Min assist     General bed mobility comments: A with R LE only.  cues for technique.    Transfers Overall transfer level: Needs assistance Equipment used: Rolling walker (2 wheeled) Transfers: Sit to/from Stand Sit to Stand: Mod assist         General transfer comment: cues for UE use and scooting to EOB prior to coming to standing.  pt needs A for power up to standing and balance.    Ambulation/Gait Ambulation/Gait assistance: Min guard Ambulation Distance (Feet): 70 Feet Assistive device: Rolling walker (2 wheeled) Gait Pattern/deviations: Step-through pattern;Decreased stride length     General Gait Details: pt moves slowly with small steps.  pt does endorse "little lightheaded" on the way back to her room, but denied the need to sit.  pt indicates symptoms resolved once sitting.    Stairs            Wheelchair Mobility    Modified Rankin (Stroke Patients Only)       Balance Overall balance assessment: Needs  assistance Sitting-balance support: No upper extremity supported;Feet supported Sitting balance-Leahy Scale: Good     Standing balance support: Single extremity supported;Bilateral upper extremity supported;During functional activity Standing balance-Leahy Scale: Poor                               Pertinent Vitals/Pain Pain Assessment: 0-10 Pain Score: 4  Pain Location: R Hip Pain Descriptors / Indicators: Tightness Pain Intervention(s): Monitored during session;Premedicated before session;Repositioned    Home Living Family/patient expects to be discharged to:: Skilled nursing facility Living Arrangements: Alone                    Prior Function Level of Independence: Independent with assistive device(s)         Comments: cane for mobility. Very active in community. Travels with church     Hand Dominance        Extremity/Trunk Assessment   Upper Extremity Assessment: Defer to OT evaluation           Lower Extremity Assessment: RLE deficits/detail RLE Deficits / Details: Strength and ROM limited post-op.  Sensation intact.    Cervical / Trunk Assessment: Normal  Communication   Communication: No difficulties  Cognition Arousal/Alertness: Awake/alert Behavior During Therapy: WFL for tasks assessed/performed Overall Cognitive Status: Within Functional Limits for tasks assessed  General Comments      Exercises Total Joint Exercises Ankle Circles/Pumps: AROM;Both;10 reps Quad Sets: AROM;Both;10 reps Short Arc Quad: AROM;Right;10 reps Hip ABduction/ADduction: AAROM;Right;10 reps      Assessment/Plan    PT Assessment Patient needs continued PT services  PT Diagnosis Abnormality of gait;Generalized weakness   PT Problem List Decreased strength;Decreased activity tolerance;Decreased balance;Decreased mobility;Decreased knowledge of use of DME;Obesity;Pain  PT Treatment Interventions DME instruction;Gait  training;Stair training;Functional mobility training;Therapeutic activities;Therapeutic exercise;Balance training;Patient/family education   PT Goals (Current goals can be found in the Care Plan section) Acute Rehab PT Goals Patient Stated Goal: To get back to traveling with her church.   PT Goal Formulation: With patient Time For Goal Achievement: 07/01/16 Potential to Achieve Goals: Good    Frequency 7X/week   Barriers to discharge        Co-evaluation               End of Session Equipment Utilized During Treatment: Gait belt Activity Tolerance: Patient tolerated treatment well Patient left: in chair;with call bell/phone within reach Nurse Communication: Mobility status         Time: [id number] PT Time Calculation (min) (ACUTE ONLY): 26 min   Charges:   PT Evaluation $PT Eval Moderate Complexity: 1 Procedure PT Treatments $Gait Training: 8-22 mins   PT G CodesCatarina Hartshorn, [place] 06/24/2016, 9:58 AM

## 2016-06-24 NOTE — Evaluation (Signed)
Occupational Therapy Evaluation Patient Details Name: [name] MRN: [id number] DOB: [birth year] Today's Date: 06/24/2016    History of Present Illness pt rpesents post R THR.  pt with hx of DM, Tinnitus, Dizziness, R TKR, and Neuropathies.     Clinical Impression   Pt reports she was independent with ADL PTA. Currently pt overall min guard for ADL and functional mobility with the exception of min assist for LB ADL. Pt planning to d/c to SNF for further rehab prior to returning home alone; agree with SNF placement for increased independence and safety with ADL and functional mobility prior to return home. All further OT needs can be met at the next venue of care; will defer OT to SNF. Please re-consult if needs change. Thank you for this referral.    Follow Up Recommendations  SNF;Supervision/Assistance - 24 hour    Equipment Recommendations  Other (comment) (TBD at next venue)    Recommendations for Other Services       Precautions / Restrictions Precautions Precautions: Fall Precaution Comments: No Hip Precautions. Restrictions Weight Bearing Restrictions: Yes RLE Weight Bearing: Weight bearing as tolerated      Mobility Bed Mobility Overal bed mobility: Needs Assistance Bed Mobility: Supine to Sit     Supine to sit: Min assist     General bed mobility comments: Assist for RLE to EOB. VCs for technique. HOB flat with use of bed rails.  Transfers Overall transfer level: Needs assistance Equipment used: Rolling walker (2 wheeled) Transfers: Sit to/from Stand Sit to Stand: Min assist         General transfer comment: Min assist to boost up from EOB. VCs for hand placement and proper technique with RW.    Balance Overall balance assessment: Needs assistance Sitting-balance support: Feet supported;No upper extremity supported Sitting balance-Leahy Scale: Good     Standing balance support: No upper extremity supported;During functional activity Standing  balance-Leahy Scale: Fair                              ADL Overall ADL's : Needs assistance/impaired Eating/Feeding: Set up;Sitting   Grooming: Min guard;Wash/dry hands;Standing   Upper Body Bathing: Set up;Supervision/ safety;Sitting   Lower Body Bathing: Minimal assistance;Sit to/from stand   Upper Body Dressing : Set up;Supervision/safety;Sitting   Lower Body Dressing: Minimal assistance;Sit to/from stand   Toilet Transfer: Min guard;Ambulation;Comfort height toilet;Grab bars;RW   Toileting- Water quality scientist and Hygiene: Min guard;Sit to/from stand       Functional mobility during ADLs: Min guard;Rolling walker General ADL Comments: Educated pt on OOB activity and encouraged movement. Educated pt on use of ice for edema and pain.     Vision Vision Assessment?: No apparent visual deficits   Perception     Praxis      Pertinent Vitals/Pain Pain Assessment: Faces Pain Score: 4  Faces Pain Scale: Hurts little more Pain Location: R hip Pain Descriptors / Indicators: Sore;Tightness Pain Intervention(s): Monitored during session;Repositioned;Ice applied     Hand Dominance     Extremity/Trunk Assessment Upper Extremity Assessment Upper Extremity Assessment: Overall WFL for tasks assessed   Lower Extremity Assessment Lower Extremity Assessment: Defer to PT evaluation RLE Deficits / Details: Strength and ROM limited post-op.  Sensation intact. RLE Coordination: decreased fine motor;decreased gross motor   Cervical / Trunk Assessment Cervical / Trunk Assessment: Normal   Communication Communication Communication: No difficulties   Cognition Arousal/Alertness: Awake/alert Behavior During Therapy: WFL for tasks  assessed/performed Overall Cognitive Status: Within Functional Limits for tasks assessed                     General Comments       Exercises Exercises: Total Joint     Shoulder Instructions      Home Living  Family/patient expects to be discharged to:: Skilled nursing facility Living Arrangements: Alone                                      Prior Functioning/Environment Level of Independence: Independent with assistive device(s)        Comments: cane for mobility. Very active in community. Travels with church    OT Diagnosis: Generalized weakness;Acute pain   OT Problem List:     OT Treatment/Interventions:      OT Goals(Current goals can be found in the care plan section) Acute Rehab OT Goals Patient Stated Goal: rehab before home OT Goal Formulation: All assessment and education complete, DC therapy  OT Frequency:     Barriers to D/C:            Co-evaluation              End of Session Equipment Utilized During Treatment: Rolling walker Nurse Communication: Mobility status  Activity Tolerance: Patient tolerated treatment well Patient left: in chair;with call bell/phone within reach   Time: [id number] OT Time Calculation (min): 19 min Charges:  OT General Charges $OT Visit: 1 Procedure OT Evaluation $OT Eval Moderate Complexity: 1 Procedure G-Codes:     Binnie Kand M.S., OTR/L Pager: [phone]  06/24/2016, 11:39 AM

## 2016-06-25 DIAGNOSIS — D62 Acute posthemorrhagic anemia: Secondary | ICD-10-CM

## 2016-06-25 DIAGNOSIS — M8580 Other specified disorders of bone density and structure, unspecified site: Secondary | ICD-10-CM | POA: Diagnosis not present

## 2016-06-25 DIAGNOSIS — E785 Hyperlipidemia, unspecified: Secondary | ICD-10-CM | POA: Diagnosis not present

## 2016-06-25 DIAGNOSIS — M150 Primary generalized (osteo)arthritis: Secondary | ICD-10-CM | POA: Diagnosis not present

## 2016-06-25 DIAGNOSIS — Z760 Encounter for issue of repeat prescription: Secondary | ICD-10-CM | POA: Diagnosis not present

## 2016-06-25 DIAGNOSIS — Z96641 Presence of right artificial hip joint: Secondary | ICD-10-CM | POA: Diagnosis not present

## 2016-06-25 DIAGNOSIS — M25559 Pain in unspecified hip: Secondary | ICD-10-CM | POA: Diagnosis not present

## 2016-06-25 DIAGNOSIS — Z8719 Personal history of other diseases of the digestive system: Secondary | ICD-10-CM | POA: Diagnosis not present

## 2016-06-25 DIAGNOSIS — M170 Bilateral primary osteoarthritis of knee: Secondary | ICD-10-CM | POA: Diagnosis not present

## 2016-06-25 DIAGNOSIS — S32302D Unspecified fracture of left ilium, subsequent encounter for fracture with routine healing: Secondary | ICD-10-CM | POA: Diagnosis not present

## 2016-06-25 DIAGNOSIS — R928 Other abnormal and inconclusive findings on diagnostic imaging of breast: Secondary | ICD-10-CM | POA: Diagnosis not present

## 2016-06-25 DIAGNOSIS — D649 Anemia, unspecified: Secondary | ICD-10-CM | POA: Diagnosis not present

## 2016-06-25 DIAGNOSIS — Z862 Personal history of diseases of the blood and blood-forming organs and certain disorders involving the immune mechanism: Secondary | ICD-10-CM | POA: Diagnosis not present

## 2016-06-25 DIAGNOSIS — M1990 Unspecified osteoarthritis, unspecified site: Secondary | ICD-10-CM | POA: Diagnosis not present

## 2016-06-25 DIAGNOSIS — E119 Type 2 diabetes mellitus without complications: Secondary | ICD-10-CM | POA: Diagnosis not present

## 2016-06-25 DIAGNOSIS — M1611 Unilateral primary osteoarthritis, right hip: Secondary | ICD-10-CM | POA: Diagnosis not present

## 2016-06-25 LAB — CBC
HEMATOCRIT: 26.9 % — AB (ref 36.0–46.0)
HEMOGLOBIN: 8.5 g/dL — AB (ref 12.0–15.0)
MCH: 27.1 pg (ref 26.0–34.0)
MCHC: 31.6 g/dL (ref 30.0–36.0)
MCV: 85.7 fL (ref 78.0–100.0)
Platelets: 159 10*3/uL (ref 150–400)
RBC: 3.14 MIL/uL — ABNORMAL LOW (ref 3.87–5.11)
RDW: 14.1 % (ref 11.5–15.5)
WBC: 9.8 10*3/uL (ref 4.0–10.5)

## 2016-06-25 LAB — GLUCOSE, CAPILLARY
Glucose-Capillary: 131 mg/dL — ABNORMAL HIGH (ref 65–99)
Glucose-Capillary: 187 mg/dL — ABNORMAL HIGH (ref 65–99)

## 2016-06-25 NOTE — Clinical Social Work Placement (Signed)
   CLINICAL SOCIAL WORK PLACEMENT  NOTE  Date:  06/25/2016  Patient Details  Name: [name] MRN: [id number] Date of Birth: 06/02/1930  Clinical Social Work is seeking post-discharge placement for this patient at the [place] level of care (*CSW will initial, date and re-position this form in  chart as items are completed):  Yes   Patient/family provided with [facility] Work Department's list of facilities offering this level of care within the geographic area requested by the patient (or if unable, by the patient's family).  Yes   Patient/family informed of their freedom to choose among providers that offer the needed level of care, that participate in Medicare, Medicaid or managed care program needed by the patient, have an available bed and are willing to accept the patient.  Yes   Patient/family informed of [facility]'s ownership interest in [facility] and [facility], as well as of the fact that they are under no obligation to receive care at these facilities.  PASRR submitted to EDS on 06/25/16     PASRR number received on 06/25/16     Existing PASRR number confirmed on       FL2 transmitted to all facilities in geographic area requested by pt/family on 06/25/16     FL2 transmitted to all facilities within larger geographic area on       Patient informed that his/her managed care company has contracts with or will negotiate with certain facilities, including the following:        Yes   Patient/family informed of bed offers received.  Patient chooses bed at [facility]     Physician recommends and patient chooses bed at      Patient to be transferred to [facility] on 06/25/16.  Patient to be transferred to facility by ambulance     Patient family notified on 06/25/16 of transfer.  Name of family member notified:        PHYSICIAN Please prepare priority discharge summary, including medications, Please sign FL2,  Please prepare prescriptions     Additional Comment:  Per MD patient is ready to discharge to [facility] . RN, patient, patient's family, and facility notified of discharge. RN given phone number for report and transport packet is on patient's chart. Ambulance transport requested. CSW signing off.   _______________________________________________ Samule Dry, LCSW 06/25/2016, 3:01 PM

## 2016-06-25 NOTE — Clinical Social Work Note (Signed)
Clinical Social Work Assessment  Patient Details  Name: [name] MRN: [id number] Date of Birth: 03/23/1930  Date of referral:  06/25/16               Reason for consult:  Discharge Planning                Permission sought to share information with:  Facility Art therapist granted to share information::     Name::        Agency::  SNFs  Relationship::     Contact Information:     Housing/Transportation Living arrangements for the past 2 months:  Single Family Home Source of Information:  Patient Patient Interpreter Needed:  None Criminal Activity/Legal Involvement Pertinent to Current Situation/Hospitalization:  No - Comment as needed Significant Relationships:  None Lives with:  Self Do you feel safe going back to the place where you live?  Yes Need for family participation in patient care:  No (Coment)  Care giving concerns: The patient is aggregable for short term rehab at discharge. Patient would like to rebuild his/ her strength to return home.    Social Worker assessment / plan:  CSW met with patient at beside to complete assessment. Patient was resting comfortably in bed. CSW explained PT recommendation for SNF placement. CSW explained SNF search and placement process to the patient and answered her questions. Patient reported she would like placement with [facility].  Employment status:  Retired Nurse, adult PT Recommendations:  [place] / Referral to community resources:  [place]  Patient/Family's Response to care:  The patient appears happy with the care she is receiving in hospital and is appreciative of CSW assistance.  Patient/Family's Understanding of and Emotional Response to Diagnosis, Current Treatment, and Prognosis: The patient has a good understanding of why she was admitted. She understands the care plan and what she will need post  discharge.  Emotional Assessment Appearance:  Appears stated age Attitude/Demeanor/Rapport:   (Patient was appropriate) Affect (typically observed):  Accepting, Appropriate, Calm Orientation:  Oriented to Self, Oriented to Place, Oriented to  Time, Oriented to Situation Alcohol / Substance use:  Not Applicable Psych involvement (Current and /or in the community):  No (Comment)  Discharge Needs  Concerns to be addressed:  Discharge Planning Concerns Readmission within the last 30 days:  No Current discharge risk:  None Barriers to Discharge:  No Barriers Identified   Samule Dry, LCSW 06/25/2016, 2:56 PM

## 2016-06-25 NOTE — Progress Notes (Signed)
Report called to [facility] and patient transported by [facility].

## 2016-06-25 NOTE — Progress Notes (Signed)
Physical Therapy Treatment Patient Details Name: [name] MRN: [id number] DOB: [birth year] Today's Date: 06/25/2016    History of Present Illness pt rpesents post R THR.  pt with hx of DM, Tinnitus, Dizziness, R TKR, and Neuropathies.      PT Comments    Plan is for discharge to [facility] this PM.  Follow Up Recommendations  SNF     Equipment Recommendations  None recommended by PT    Recommendations for Other Services       Precautions / Restrictions Precautions Precautions: Fall Precaution Comments: No Hip Precautions. Restrictions RLE Weight Bearing: Weight bearing as tolerated    Mobility  Bed Mobility Overal bed mobility: Needs Assistance       Supine to sit: Min assist;HOB elevated     General bed mobility comments: verbal cues for sequencing  Transfers   Equipment used: Rolling walker (2 wheeled) Transfers: Stand Pivot Transfers Sit to Stand: Min assist Stand pivot transfers: Min assist       General transfer comment: Verbal cues for hand placement. Increased time to complete at min assist level.  Ambulation/Gait Ambulation/Gait assistance: Min guard Ambulation Distance (Feet): 25 Feet Assistive device: Rolling walker (2 wheeled) Gait Pattern/deviations: Step-through pattern;Decreased stride length;Trunk flexed Gait velocity: decreased Gait velocity interpretation: Below normal speed for age/gender General Gait Details: Pt moves slowly with short steps. Pt is discharging to [facility] this afternoon. She, therefore, requested forgoing hallway ambulation to wash up and change clothes in preparation for transfer.   Stairs            Wheelchair Mobility    Modified Rankin (Stroke Patients Only)       Balance   Sitting-balance support: Feet supported;No upper extremity supported Sitting balance-Leahy Scale: Good     Standing balance support: During functional activity;Bilateral upper extremity supported Standing  balance-Leahy Scale: Fair                      Cognition Arousal/Alertness: Awake/alert Behavior During Therapy: WFL for tasks assessed/performed Overall Cognitive Status: Within Functional Limits for tasks assessed                      Exercises      General Comments        Pertinent Vitals/Pain Pain Assessment: 0-10 Pain Score: 3  Pain Location: sx site Pain Descriptors / Indicators: Sore;Tightness Pain Intervention(s): Limited activity within patient's tolerance;Monitored during session    Home Living                      Prior Function            PT Goals (current goals can now be found in the care plan section) Acute Rehab PT Goals Patient Stated Goal: rehab before home PT Goal Formulation: With patient Time For Goal Achievement: 07/01/16 Potential to Achieve Goals: Good Progress towards PT goals: Progressing toward goals    Frequency  7X/week    PT Plan Current plan remains appropriate    Co-evaluation             End of Session Equipment Utilized During Treatment: Gait belt Activity Tolerance: Patient tolerated treatment well Patient left: in chair;with call bell/phone within reach;Other (comment) (in bathroom in chair at sink, NT aware)     Time: [id number] PT Time Calculation (min) (ACUTE ONLY): 25 min  Charges:  $Gait Training: 8-22 mins $Therapeutic Activity: 8-22 mins  G Codes:      Lorriane Shire 06/25/2016, 11:55 AM

## 2016-06-25 NOTE — Progress Notes (Signed)
Patient will call a family member to pick up her suitcase left by nurses station

## 2016-06-25 NOTE — Discharge Summary (Signed)
Patient ID: [name] MRN: [id number] DOB/AGE: Dec 15, 1929 82 y.o.  Admit date: 06/23/2016 Discharge date: 06/25/2016  Admission Diagnoses:  Principal Problem:   Primary osteoarthritis of right hip Active Problems:   Acute blood loss anemia   Discharge Diagnoses:  Same  Past Medical History:  Diagnosis Date  . Abnormal breast exam 2004   MMG neg (eval by surgeon neg)  . Anemia   . Arthritis    "knees" (07/11/2014)  . History of blood transfusion    "related to a surgery, I think"  . Hx SBO 2004 , 2007, 2012    exploratory surgery 9-12   . Iritis 2005  . Osteoarthritis   . Osteopenia   . SBO (small bowel obstruction) ([place]) 07-2014  . Type II diabetes mellitus (HCC)     Surgeries: Procedure(s):Right TOTAL HIP ARTHROPLASTY ANTERIOR APPROACH on 06/23/2016    Discharged Condition: Improved  Hospital Course: [name] is an 82 y.o. female who was admitted 06/23/2016 for operative treatment ofPrimary osteoarthritis of right hip. Patient has severe unremitting pain that affects sleep, daily activities, and work/hobbies. After pre-op clearance the patient was taken to the operating room on 06/23/2016 and underwent  Procedure(s): Right TOTAL HIP ARTHROPLASTY ANTERIOR APPROACH.    Patient was given perioperative antibiotics: Anti-infectives    Start     Dose/Rate Route Frequency Ordered Stop   06/23/16 2000  ceFAZolin (ANCEF) IVPB 2g/100 mL premix     2 g 200 mL/hr over 30 Minutes Intravenous Every 6 hours 06/23/16 1934 06/24/16 0303   06/23/16 1200  ceFAZolin (ANCEF) IVPB 2g/100 mL premix     2 g 200 mL/hr over 30 Minutes Intravenous To [facility]Stay [facility] 06/22/16 0912 06/23/16 1245       Patient was given sequential compression devices, early ambulation, and chemoprophylaxis to prevent DVT.  Patient benefited maximally from hospital stay and there were no complications.    Recent vital signs: Patient Vitals for the past 24 hrs:  BP Temp Temp src Pulse Resp SpO2   06/25/16 0617 (!) 117/57 99.1 F (37.3 C) Oral 97 18 96 %  06/24/16 2027 (!) 125/58 100 F (37.8 C) Oral 94 17 98 %  06/24/16 1450 (!) 124/59 99.2 F (37.3 C) Oral (!) 102 16 95 %     Recent laboratory studies:  Recent Labs  06/24/16 0500 06/25/16 0544  WBC 10.3 9.8  HGB 9.2* 8.5*  HCT 29.8* 26.9*  PLT 180 159  NA 138  --   K 4.0  --   CL 104  --   CO2 25  --   BUN 13  --   CREATININE 0.81  --   GLUCOSE 161*  --   CALCIUM 8.5*  --      Discharge Medications:     Medication List    STOP taking these medications   aspirin 81 MG tablet Replaced by:  aspirin EC 325 MG tablet     TAKE these medications   acetaminophen 650 MG CR tablet Commonly known as:  TYLENOL Take 650-1,300 mg by mouth every 8 (eight) hours as needed for pain.   aspirin EC 325 MG tablet Take 1 tablet (325 mg total) by mouth 2 (two) times daily after a meal. Take x 1 month post op to decrease risk of blood clots. Replaces:  aspirin 81 MG tablet   CALCIUM-MAGNESUIUM-ZINC PO Take 1 tablet by mouth daily.   CENTRUM SILVER PO Take 1 capsule by mouth daily.   cholecalciferol 1000 units  tablet Commonly known as:  VITAMIN D Take 2,000 Units by mouth daily.   Fish Oil 1000 MG Caps Take 2 capsules by mouth daily.   HYDROcodone-acetaminophen 5-325 MG tablet Commonly known as:  NORCO Take 1-2 tablets by mouth every 6 (six) hours as needed for severe pain.   metFORMIN 500 MG tablet Commonly known as:  GLUCOPHAGE Take 1 tablet (500 mg total) by mouth 2 (two) times daily with a meal.   pravastatin 10 MG tablet Commonly known as:  PRAVACHOL Take 1 tablet (10 mg total) by mouth at bedtime.       Diagnostic Studies: Dg Chest 2 View  Result Date: 06/12/2016 CLINICAL DATA:  82 year old female undergoing preoperative evaluation prior to right total hip arthroplasty EXAM: CHEST  2 VIEW COMPARISON:  Prior chest x-ray 07/10/2011 FINDINGS: Cardiac and mediastinal contours are within normal limits  for age. The thoracic aorta is tortuous but otherwise unremarkable. The lungs are clear. No pneumothorax or pleural effusion. No evidence of acute osseous abnormality. IMPRESSION: Negative chest x-ray. Electronically Signed   By: Jacqulynn Cadet M.D.   On: 06/12/2016 13:51   Dg Hip Operative Unilat With Pelvis Right  Result Date: 06/23/2016 CLINICAL DATA:  Right hip replacement EXAM: OPERATIVE RIGHT HIP WITH PELVIS COMPARISON:  None. FLUOROSCOPY TIME:  Radiation Exposure Index (as provided by the fluoroscopic device): Not available If the device does not provide the exposure index: Fluoroscopy Time:  32 seconds Number of Acquired Images:  2 FINDINGS: Right hip replacement is noted. No acute bony or soft tissue abnormality is seen. IMPRESSION: Right hip replacement Electronically Signed   By: Inez Catalina M.D.   On: 06/23/2016 14:41    Disposition: Skilled nursing facility  Discharge Instructions    Call MD / Call 911    Complete by:  As directed   If you experience chest pain or shortness of breath, CALL 911 and be transported to the hospital emergency room.  If you develope a fever above 101 F, pus (white drainage) or increased drainage or redness at the wound, or calf pain, call your surgeon's office.   Constipation Prevention    Complete by:  As directed   Drink plenty of fluids.  Prune juice may be helpful.  You may use a stool softener, such as Colace (over the counter) 100 mg twice a day.  Use MiraLax (over the counter) for constipation as needed.   Diet Carb Modified    Complete by:  As directed   Do not sit on low chairs, stoools or toilet seats, as it may be difficult to get up from low surfaces    Complete by:  As directed   Increase activity slowly as tolerated    Complete by:  As directed   Weight bearing as tolerated    Complete by:  As directed   Laterality:  right   Extremity:  Lower   Weight bearing as tolerated    Complete by:  As directed   Laterality:  right   Extremity:   Lower    She may weight-bear as tolerated without hip precautions on the right.  Follow-up Information    GRAVES,JOHN L, MD. Schedule an appointment as soon as possible for a visit in 2 weeks.   Specialty:  Orthopedic Surgery Contact information: [place] [address] [phone]            Signed: Erlene Senters 06/25/2016, 8:43 AM

## 2016-06-25 NOTE — Progress Notes (Signed)
Subjective: 2 Days Post-Op Procedure(s) (LRB): TOTAL HIP ARTHROPLASTY ANTERIOR APPROACH (Right) Patient reports pain as mild.  Progressing with physical therapy. Taking by mouth and voiding okay. No dizziness.  Objective: Vital signs in last 24 hours: Temp:  [99.1 F (37.3 C)-100 F (37.8 C)] 99.1 F (37.3 C) (08/23 0617) Pulse Rate:  [94-102] 97 (08/23 0617) Resp:  [16-18] 18 (08/23 0617) BP: (117-125)/(57-59) 117/57 (08/23 0617) SpO2:  [95 %-98 %] 96 % (08/23 0617)  Intake/Output from previous day: 08/22 0701 - 08/23 0700 In: 240 [P.O.:240] Out: -  Intake/Output this shift: No intake/output data recorded.   Recent Labs  06/24/16 0500 06/25/16 0544  HGB 9.2* 8.5*    Recent Labs  06/24/16 0500 06/25/16 0544  WBC 10.3 9.8  RBC 3.47* 3.14*  HCT 29.8* 26.9*  PLT 180 159    Recent Labs  06/24/16 0500  NA 138  K 4.0  CL 104  CO2 25  BUN 13  CREATININE 0.81  GLUCOSE 161*  CALCIUM 8.5*   No results for input(s): LABPT, INR in the last 72 hours. Right hip exam: Neurovascular intact Sensation intact distally Intact pulses distally Dorsiflexion/Plantar flexion intact Incision: dressing C/D/I Compartment soft  Assessment/Plan: 2 Days Post-Op Procedure(s) (LRB): TOTAL HIP ARTHROPLASTY ANTERIOR APPROACH (Right) Acute blood loss anemia, asymptomatic. Expected. Plan: Weightbearing as tolerated on the right without hip precautions. Aspirin 325 mg twice daily 1 month postop for DVT prophylaxis. Up with therapy Discharge to SNF today. Follow-up with Dr. Berenice Primas in 2 weeks.  [place] G 06/25/2016, 8:39 AM

## 2016-06-25 NOTE — NC FL2 (Signed)
[place] LEVEL OF CARE SCREENING TOOL     IDENTIFICATION  Patient Name: LYLIE FREEDMAN Birthdate: 01-20-1930 Sex: female Admission Date (Current Location): 06/23/2016  [facility] and [place] Number:  Herbalist and Address:  [facility], [place] [address] [place], [place] [address]      Provider Number: [id number]  Attending Physician Name and Address:  Dorna Leitz, MD  Relative Name and Phone Number:       Current Level of Care: Hospital Recommended Level of Care: [place] Prior Approval Number:    Date Approved/Denied:   PASRR Number: [id number]  Discharge Plan: SNF    Current Diagnoses: Patient Active Problem List   Diagnosis Date Noted  . Acute blood loss anemia 06/25/2016  . Primary osteoarthritis of right hip 06/23/2016  . PCP NOTES >>>>>>>>>>>. 05/02/2016  . Diabetes mellitus without complication ([place]) [id number]  . UTI (lower urinary tract infection) 12/07/2015  . SBO (small bowel obstruction) ([place]) 12/07/2015  . Right hip pain 09/04/2015  . Left-sided tinnitus 05/23/2015  . Neuropathy ([place]) 05/23/2015  . Right shoulder pain 04/26/2015  . Dizziness 04/24/2015  . Facial pain syndrome 10/23/2014  . Pulmonary nodules 07/24/2014  . Small bowel obstruction ([place]) 07/11/2014  . Special screening for malignant neoplasms, colon 04/18/2013  . Annual physical exam 08/27/2011  . ANEMIA 11/13/2010  . Tinnitus 08/26/2010  . SEBORRHEIC KERATOSIS 05/17/2010  . Dyslipidemia 04/09/2009  . Osteoarthritis 12/21/2008  . DMII (diabetes mellitus, type 2) ([place]) 12/01/2007  . GERD 02/11/2007  . Osteopenia 02/11/2007    Orientation RESPIRATION BLADDER Height & Weight     Self, Time, Situation, Place  Normal Continent Weight: 191 lb (86.6 kg) Height:     BEHAVIORAL SYMPTOMS/MOOD NEUROLOGICAL BOWEL NUTRITION STATUS   (None)  (none) Continent Diet (Regular)  AMBULATORY STATUS COMMUNICATION OF NEEDS Skin    Limited Assist Verbally Surgical wounds (Incision Closed: RT Hip)                       Personal Care Assistance Level of Assistance  Bathing, Feeding, Dressing Bathing Assistance: Limited assistance Feeding assistance: Independent Dressing Assistance: Limited assistance     Functional Limitations Info  Sight, Hearing, Speech Sight Info: Adequate Hearing Info: Adequate Speech Info: Adequate    SPECIAL CARE FACTORS FREQUENCY  PT (By licensed PT), OT (By licensed OT)     PT Frequency: 5/ week  OT Frequency: 5/ week            Contractures Contractures Info: Not present    Additional Factors Info  Allergies   Allergies Info: lipitor Atorvastatin, Zocor Simvastatin           Current Medications (06/25/2016):  This is the current hospital active medication list Current Facility-Administered Medications  Medication Dose Route Frequency Provider Last Rate Last Dose  . 0.9 %  sodium chloride infusion   Intravenous Continuous Gary Fleet, PA-C 100 mL/hr at 06/23/16 2040    . acetaminophen (TYLENOL) tablet 650 mg  650 mg Oral Q6H PRN Gary Fleet, PA-C       Or  . acetaminophen (TYLENOL) suppository 650 mg  650 mg Rectal Q6H PRN Gary Fleet, PA-C      . alum & mag hydroxide-simeth (MAALOX/MYLANTA) 200-200-20 MG/5ML suspension 30 mL  30 mL Oral Q4H PRN Gary Fleet, PA-C      . aspirin EC tablet 325 mg  325 mg Oral BID PC Gary Fleet, PA-C  325 mg at 06/25/16 0835  . bisacodyl (DULCOLAX) EC tablet 5 mg  5 mg Oral Daily PRN Gary Fleet, PA-C      . diphenhydrAMINE (BENADRYL) 12.5 MG/5ML elixir 12.5-25 mg  12.5-25 mg Oral Q4H PRN Gary Fleet, PA-C      . docusate sodium (COLACE) capsule 100 mg  100 mg Oral BID Gary Fleet, PA-C   100 mg at 06/24/16 2000  . HYDROmorphone (DILAUDID) injection 0.5-1 mg  0.5-1 mg Intravenous Q4H PRN Gary Fleet, PA-C   1 mg at 06/25/16 0845  . insulin aspart (novoLOG) injection 0-15 Units  0-15 Units Subcutaneous TID WC Gary Fleet, PA-C   2 Units at 06/25/16 0730  . magnesium citrate solution 1 Bottle  1 Bottle Oral Once PRN Gary Fleet, PA-C      . metFORMIN (GLUCOPHAGE) tablet 500 mg  500 mg Oral BID WC Gary Fleet, PA-C   500 mg at 06/25/16 0835  . methocarbamol (ROBAXIN) tablet 500 mg  500 mg Oral Q6H PRN Gary Fleet, PA-C   500 mg at 06/25/16 0310   Or  . methocarbamol (ROBAXIN) 500 mg in dextrose 5 % 50 mL IVPB  500 mg Intravenous Q6H PRN Gary Fleet, PA-C      . ondansetron [facility] tablet 4 mg  4 mg Oral Q6H PRN Gary Fleet, PA-C       Or  . ondansetron [facility]) injection 4 mg  4 mg Intravenous Q6H PRN Gary Fleet, PA-C      . oxyCODONE (Oxy IR/ROXICODONE) immediate release tablet 5-10 mg  5-10 mg Oral Q3H PRN Gary Fleet, PA-C   10 mg at 06/25/16 [id number]  . polyethylene glycol (MIRALAX / GLYCOLAX) packet 17 g  17 g Oral Daily PRN Gary Fleet, PA-C      . pravastatin (PRAVACHOL) tablet 10 mg  10 mg Oral QHS Gary Fleet, PA-C   10 mg at 06/24/16 2000     Discharge Medications: Please see discharge summary for a list of discharge medications.  Relevant Imaging Results:  Relevant Lab Results:   Additional Information SSN: [id number]  Samule Dry, LCSW

## 2016-06-26 DIAGNOSIS — D649 Anemia, unspecified: Secondary | ICD-10-CM | POA: Diagnosis not present

## 2016-06-26 DIAGNOSIS — M150 Primary generalized (osteo)arthritis: Secondary | ICD-10-CM | POA: Diagnosis not present

## 2016-06-26 DIAGNOSIS — E785 Hyperlipidemia, unspecified: Secondary | ICD-10-CM | POA: Diagnosis not present

## 2016-06-26 DIAGNOSIS — Z760 Encounter for issue of repeat prescription: Secondary | ICD-10-CM | POA: Diagnosis not present

## 2016-06-26 DIAGNOSIS — E119 Type 2 diabetes mellitus without complications: Secondary | ICD-10-CM | POA: Diagnosis not present

## 2016-06-26 DIAGNOSIS — S32302D Unspecified fracture of left ilium, subsequent encounter for fracture with routine healing: Secondary | ICD-10-CM | POA: Diagnosis not present

## 2016-07-02 ENCOUNTER — Telehealth: Payer: Self-pay | Admitting: Internal Medicine

## 2016-07-02 NOTE — Telephone Encounter (Signed)
Caller name: Edwyna Ready Relationship to patient: [facility] Can be reached: [phone]   Reason for call: Wants to confirm that Dr. Larose Kells is going to sign Home Health Orders

## 2016-07-02 NOTE — Telephone Encounter (Signed)
LMOM informing [name] at [facility] that Dr. Berenice Primas (ortho) will need to sign off on Home Health orders. Instructed him to call if he has any questions or concerns.

## 2016-07-03 DIAGNOSIS — E785 Hyperlipidemia, unspecified: Secondary | ICD-10-CM | POA: Diagnosis not present

## 2016-07-03 DIAGNOSIS — D6489 Other specified anemias: Secondary | ICD-10-CM | POA: Diagnosis not present

## 2016-07-03 DIAGNOSIS — Z96641 Presence of right artificial hip joint: Secondary | ICD-10-CM | POA: Diagnosis not present

## 2016-07-03 DIAGNOSIS — Z7982 Long term (current) use of aspirin: Secondary | ICD-10-CM | POA: Diagnosis not present

## 2016-07-03 DIAGNOSIS — Z7984 Long term (current) use of oral hypoglycemic drugs: Secondary | ICD-10-CM | POA: Diagnosis not present

## 2016-07-03 DIAGNOSIS — M8580 Other specified disorders of bone density and structure, unspecified site: Secondary | ICD-10-CM | POA: Diagnosis not present

## 2016-07-03 DIAGNOSIS — Z471 Aftercare following joint replacement surgery: Secondary | ICD-10-CM | POA: Diagnosis not present

## 2016-07-03 DIAGNOSIS — M6281 Muscle weakness (generalized): Secondary | ICD-10-CM | POA: Diagnosis not present

## 2016-07-03 DIAGNOSIS — M170 Bilateral primary osteoarthritis of knee: Secondary | ICD-10-CM | POA: Diagnosis not present

## 2016-07-03 DIAGNOSIS — Z993 Dependence on wheelchair: Secondary | ICD-10-CM | POA: Diagnosis not present

## 2016-07-03 DIAGNOSIS — E119 Type 2 diabetes mellitus without complications: Secondary | ICD-10-CM | POA: Diagnosis not present

## 2016-07-04 DIAGNOSIS — M170 Bilateral primary osteoarthritis of knee: Secondary | ICD-10-CM | POA: Diagnosis not present

## 2016-07-04 DIAGNOSIS — Z7982 Long term (current) use of aspirin: Secondary | ICD-10-CM | POA: Diagnosis not present

## 2016-07-04 DIAGNOSIS — E119 Type 2 diabetes mellitus without complications: Secondary | ICD-10-CM | POA: Diagnosis not present

## 2016-07-04 DIAGNOSIS — Z96641 Presence of right artificial hip joint: Secondary | ICD-10-CM | POA: Diagnosis not present

## 2016-07-04 DIAGNOSIS — Z993 Dependence on wheelchair: Secondary | ICD-10-CM | POA: Diagnosis not present

## 2016-07-04 DIAGNOSIS — M6281 Muscle weakness (generalized): Secondary | ICD-10-CM | POA: Diagnosis not present

## 2016-07-04 DIAGNOSIS — Z471 Aftercare following joint replacement surgery: Secondary | ICD-10-CM | POA: Diagnosis not present

## 2016-07-04 DIAGNOSIS — E785 Hyperlipidemia, unspecified: Secondary | ICD-10-CM | POA: Diagnosis not present

## 2016-07-04 DIAGNOSIS — D6489 Other specified anemias: Secondary | ICD-10-CM | POA: Diagnosis not present

## 2016-07-04 DIAGNOSIS — Z7984 Long term (current) use of oral hypoglycemic drugs: Secondary | ICD-10-CM | POA: Diagnosis not present

## 2016-07-04 DIAGNOSIS — M8580 Other specified disorders of bone density and structure, unspecified site: Secondary | ICD-10-CM | POA: Diagnosis not present

## 2016-07-08 DIAGNOSIS — M170 Bilateral primary osteoarthritis of knee: Secondary | ICD-10-CM | POA: Diagnosis not present

## 2016-07-08 DIAGNOSIS — M8580 Other specified disorders of bone density and structure, unspecified site: Secondary | ICD-10-CM | POA: Diagnosis not present

## 2016-07-08 DIAGNOSIS — Z96641 Presence of right artificial hip joint: Secondary | ICD-10-CM | POA: Diagnosis not present

## 2016-07-08 DIAGNOSIS — M6281 Muscle weakness (generalized): Secondary | ICD-10-CM | POA: Diagnosis not present

## 2016-07-08 DIAGNOSIS — Z471 Aftercare following joint replacement surgery: Secondary | ICD-10-CM | POA: Diagnosis not present

## 2016-07-08 DIAGNOSIS — E119 Type 2 diabetes mellitus without complications: Secondary | ICD-10-CM | POA: Diagnosis not present

## 2016-07-08 DIAGNOSIS — Z993 Dependence on wheelchair: Secondary | ICD-10-CM | POA: Diagnosis not present

## 2016-07-08 DIAGNOSIS — D6489 Other specified anemias: Secondary | ICD-10-CM | POA: Diagnosis not present

## 2016-07-08 DIAGNOSIS — Z7982 Long term (current) use of aspirin: Secondary | ICD-10-CM | POA: Diagnosis not present

## 2016-07-08 DIAGNOSIS — E785 Hyperlipidemia, unspecified: Secondary | ICD-10-CM | POA: Diagnosis not present

## 2016-07-08 DIAGNOSIS — Z7984 Long term (current) use of oral hypoglycemic drugs: Secondary | ICD-10-CM | POA: Diagnosis not present

## 2016-07-09 DIAGNOSIS — Z7984 Long term (current) use of oral hypoglycemic drugs: Secondary | ICD-10-CM | POA: Diagnosis not present

## 2016-07-09 DIAGNOSIS — M6281 Muscle weakness (generalized): Secondary | ICD-10-CM | POA: Diagnosis not present

## 2016-07-09 DIAGNOSIS — Z471 Aftercare following joint replacement surgery: Secondary | ICD-10-CM | POA: Diagnosis not present

## 2016-07-09 DIAGNOSIS — Z7982 Long term (current) use of aspirin: Secondary | ICD-10-CM | POA: Diagnosis not present

## 2016-07-09 DIAGNOSIS — Z993 Dependence on wheelchair: Secondary | ICD-10-CM | POA: Diagnosis not present

## 2016-07-09 DIAGNOSIS — D6489 Other specified anemias: Secondary | ICD-10-CM | POA: Diagnosis not present

## 2016-07-09 DIAGNOSIS — E785 Hyperlipidemia, unspecified: Secondary | ICD-10-CM | POA: Diagnosis not present

## 2016-07-09 DIAGNOSIS — M170 Bilateral primary osteoarthritis of knee: Secondary | ICD-10-CM | POA: Diagnosis not present

## 2016-07-09 DIAGNOSIS — E119 Type 2 diabetes mellitus without complications: Secondary | ICD-10-CM | POA: Diagnosis not present

## 2016-07-09 DIAGNOSIS — M8580 Other specified disorders of bone density and structure, unspecified site: Secondary | ICD-10-CM | POA: Diagnosis not present

## 2016-07-09 DIAGNOSIS — Z96641 Presence of right artificial hip joint: Secondary | ICD-10-CM | POA: Diagnosis not present

## 2016-07-10 DIAGNOSIS — M1611 Unilateral primary osteoarthritis, right hip: Secondary | ICD-10-CM | POA: Diagnosis not present

## 2016-07-14 DIAGNOSIS — Z96641 Presence of right artificial hip joint: Secondary | ICD-10-CM | POA: Diagnosis not present

## 2016-07-14 DIAGNOSIS — R262 Difficulty in walking, not elsewhere classified: Secondary | ICD-10-CM | POA: Diagnosis not present

## 2016-07-14 DIAGNOSIS — M25551 Pain in right hip: Secondary | ICD-10-CM | POA: Diagnosis not present

## 2016-07-17 DIAGNOSIS — Z96641 Presence of right artificial hip joint: Secondary | ICD-10-CM | POA: Diagnosis not present

## 2016-07-17 DIAGNOSIS — R262 Difficulty in walking, not elsewhere classified: Secondary | ICD-10-CM | POA: Diagnosis not present

## 2016-07-17 DIAGNOSIS — M25551 Pain in right hip: Secondary | ICD-10-CM | POA: Diagnosis not present

## 2016-07-22 DIAGNOSIS — M25551 Pain in right hip: Secondary | ICD-10-CM | POA: Diagnosis not present

## 2016-07-22 DIAGNOSIS — Z96641 Presence of right artificial hip joint: Secondary | ICD-10-CM | POA: Diagnosis not present

## 2016-07-22 DIAGNOSIS — R262 Difficulty in walking, not elsewhere classified: Secondary | ICD-10-CM | POA: Diagnosis not present

## 2016-07-24 DIAGNOSIS — M25551 Pain in right hip: Secondary | ICD-10-CM | POA: Diagnosis not present

## 2016-07-24 DIAGNOSIS — Z96641 Presence of right artificial hip joint: Secondary | ICD-10-CM | POA: Diagnosis not present

## 2016-07-24 DIAGNOSIS — R262 Difficulty in walking, not elsewhere classified: Secondary | ICD-10-CM | POA: Diagnosis not present

## 2016-07-29 DIAGNOSIS — Z96641 Presence of right artificial hip joint: Secondary | ICD-10-CM | POA: Diagnosis not present

## 2016-07-29 DIAGNOSIS — M25551 Pain in right hip: Secondary | ICD-10-CM | POA: Diagnosis not present

## 2016-07-29 DIAGNOSIS — R262 Difficulty in walking, not elsewhere classified: Secondary | ICD-10-CM | POA: Diagnosis not present

## 2016-07-31 DIAGNOSIS — Z96641 Presence of right artificial hip joint: Secondary | ICD-10-CM | POA: Diagnosis not present

## 2016-07-31 DIAGNOSIS — R262 Difficulty in walking, not elsewhere classified: Secondary | ICD-10-CM | POA: Diagnosis not present

## 2016-07-31 DIAGNOSIS — M25551 Pain in right hip: Secondary | ICD-10-CM | POA: Diagnosis not present

## 2016-08-07 ENCOUNTER — Ambulatory Visit (INDEPENDENT_AMBULATORY_CARE_PROVIDER_SITE_OTHER): Payer: Medicare Other | Admitting: Internal Medicine

## 2016-08-07 ENCOUNTER — Encounter: Payer: Self-pay | Admitting: Internal Medicine

## 2016-08-07 VITALS — BP 124/76 | HR 79 | Temp 98.0°F | Resp 14 | Ht 65.0 in | Wt 183.1 lb

## 2016-08-07 DIAGNOSIS — E119 Type 2 diabetes mellitus without complications: Secondary | ICD-10-CM | POA: Diagnosis not present

## 2016-08-07 DIAGNOSIS — D649 Anemia, unspecified: Secondary | ICD-10-CM | POA: Diagnosis not present

## 2016-08-07 DIAGNOSIS — E785 Hyperlipidemia, unspecified: Secondary | ICD-10-CM | POA: Diagnosis not present

## 2016-08-07 DIAGNOSIS — Z23 Encounter for immunization: Secondary | ICD-10-CM

## 2016-08-07 DIAGNOSIS — Z0000 Encounter for general adult medical examination without abnormal findings: Secondary | ICD-10-CM

## 2016-08-07 DIAGNOSIS — R262 Difficulty in walking, not elsewhere classified: Secondary | ICD-10-CM | POA: Diagnosis not present

## 2016-08-07 DIAGNOSIS — M25551 Pain in right hip: Secondary | ICD-10-CM | POA: Diagnosis not present

## 2016-08-07 DIAGNOSIS — Z96641 Presence of right artificial hip joint: Secondary | ICD-10-CM | POA: Diagnosis not present

## 2016-08-07 LAB — LIPID PANEL
CHOL/HDL RATIO: 3
Cholesterol: 163 mg/dL (ref 0–200)
HDL: 54.3 mg/dL (ref >39.00)
LDL Cholesterol: 89 mg/dL (ref 0–99)
NONHDL: 108.61
Triglycerides: 100 mg/dL (ref 0.0–149.0)
VLDL: 20 mg/dL (ref 0.0–40.0)

## 2016-08-07 LAB — CBC WITH DIFFERENTIAL/PLATELET
BASOS ABS: 0 10*3/uL (ref 0.0–0.1)
Basophils Relative: 0.4 % (ref 0.0–3.0)
EOS PCT: 1.9 % (ref 0.0–5.0)
Eosinophils Absolute: 0.1 10*3/uL (ref 0.0–0.7)
HEMATOCRIT: 35.3 % — AB (ref 36.0–46.0)
Hemoglobin: 11.3 g/dL — ABNORMAL LOW (ref 12.0–15.0)
LYMPHS ABS: 3 10*3/uL (ref 0.7–4.0)
LYMPHS PCT: 43.1 % (ref 12.0–46.0)
MCHC: 32 g/dL (ref 30.0–36.0)
MCV: 84 fl (ref 78.0–100.0)
MONOS PCT: 6.2 % (ref 3.0–12.0)
Monocytes Absolute: 0.4 10*3/uL (ref 0.1–1.0)
NEUTROS ABS: 3.4 10*3/uL (ref 1.4–7.7)
NEUTROS PCT: 48.4 % (ref 43.0–77.0)
Platelets: 309 10*3/uL (ref 150.0–400.0)
RBC: 4.2 Mil/uL (ref 3.87–5.11)
RDW: 14.6 % (ref 11.5–15.5)
WBC: 6.9 10*3/uL (ref 4.0–10.5)

## 2016-08-07 LAB — HEMOGLOBIN A1C: Hgb A1c MFr Bld: 5.9 % (ref 4.6–6.5)

## 2016-08-07 LAB — ALT: ALT: 12 U/L (ref 0–35)

## 2016-08-07 LAB — BASIC METABOLIC PANEL
BUN: 14 mg/dL (ref 6–23)
CO2: 29 meq/L (ref 19–32)
Calcium: 9.7 mg/dL (ref 8.4–10.5)
Chloride: 104 mEq/L (ref 96–112)
Creatinine, Ser: 0.68 mg/dL (ref 0.40–1.20)
GFR: 106.03 mL/min (ref >60.00)
GLUCOSE: 99 mg/dL (ref 70–99)
POTASSIUM: 4.5 meq/L (ref 3.5–5.1)
SODIUM: 141 meq/L (ref 135–145)

## 2016-08-07 LAB — AST: AST: 18 U/L (ref 0–37)

## 2016-08-07 NOTE — Assessment & Plan Note (Addendum)
Td 09;  Pneumonia shot 10-05 and 2010;  Prevnar-- 2016;  shingles shot -- 82 year old flu shot today   CCS:  Cscope 2004 normal   Cscope 03/2014 Diverticulosis, no need to repeat due to age and results  No further PAPs, see previous entry Pt desires no more MMG , agree;  many med societies rec to d/c MMG at age 82  Diet-exercise: Diet is healthy, unable to exercise much due to hip replacement but improving gradually Has healthcare power of attorney, fall prevention discussed

## 2016-08-07 NOTE — Assessment & Plan Note (Signed)
DM: On metformin, check a BMP, last A1c satisfactory Hyperlipidemia: Based on last FLP, Pravachol was increased to 20 mg 2 tablets daily. Check a FLP, AST, ALT  DJD: Recuperating well from by right hip replacement 2 months ago approximately, + postop anemia, checking CBC RTC 4

## 2016-08-07 NOTE — Progress Notes (Signed)
Pre visit review using our clinic review tool, if applicable. No additional management support is needed unless otherwise documented below in the visit note. 

## 2016-08-07 NOTE — Patient Instructions (Addendum)
Get your blood work before you leave   Next visit in 4 months    Fall Prevention and Home Safety Falls cause injuries and can affect all age groups. It is possible to use preventive measures to significantly decrease the likelihood of falls. There are many simple measures which can make your home safer and prevent falls. OUTDOORS  Repair cracks and edges of walkways and driveways.  Remove high doorway thresholds.  Trim shrubbery on the main path into your home.  Have good outside lighting.  Clear walkways of tools, rocks, debris, and clutter.  Check that handrails are not broken and are securely fastened. Both sides of steps should have handrails.  Have leaves, snow, and ice cleared regularly.  Use sand or salt on walkways during winter months.  In the garage, clean up grease or oil spills. BATHROOM  Install night lights.  Install grab bars by the toilet and in the tub and shower.  Use non-skid mats or decals in the tub or shower.  Place a plastic non-slip stool in the shower to sit on, if needed.  Keep floors dry and clean up all water on the floor immediately.  Remove soap buildup in the tub or shower on a regular basis.  Secure bath mats with non-slip, double-sided rug tape.  Remove throw rugs and tripping hazards from the floors. BEDROOMS  Install night lights.  Make sure a bedside light is easy to reach.  Do not use oversized bedding.  Keep a telephone by your bedside.  Have a firm chair with side arms to use for getting dressed.  Remove throw rugs and tripping hazards from the floor. KITCHEN  Keep handles on pots and pans turned toward the center of the stove. Use back burners when possible.  Clean up spills quickly and allow time for drying.  Avoid walking on wet floors.  Avoid hot utensils and knives.  Position shelves so they are not too high or low.  Place commonly used objects within easy reach.  If necessary, use a sturdy step stool  with a grab bar when reaching.  Keep electrical cables out of the way.  Do not use floor polish or wax that makes floors slippery. If you must use wax, use non-skid floor wax.  Remove throw rugs and tripping hazards from the floor. STAIRWAYS  Never leave objects on stairs.  Place handrails on both sides of stairways and use them. Fix any loose handrails. Make sure handrails on both sides of the stairways are as long as the stairs.  Check carpeting to make sure it is firmly attached along stairs. Make repairs to worn or loose carpet promptly.  Avoid placing throw rugs at the top or bottom of stairways, or properly secure the rug with carpet tape to prevent slippage. Get rid of throw rugs, if possible.  Have an electrician put in a light switch at the top and bottom of the stairs. OTHER FALL PREVENTION TIPS  Wear low-heel or rubber-soled shoes that are supportive and fit well. Wear closed toe shoes.  When using a stepladder, make sure it is fully opened and both spreaders are firmly locked. Do not climb a closed stepladder.  Add color or contrast paint or tape to grab bars and handrails in your home. Place contrasting color strips on first and last steps.  Learn and use mobility aids as needed. Install an electrical emergency response system.  Turn on lights to avoid dark areas. Replace light bulbs that burn out immediately. Get   light switches that glow.  Arrange furniture to create clear pathways. Keep furniture in the same place.  Firmly attach carpet with non-skid or double-sided tape.  Eliminate uneven floor surfaces.  Select a carpet pattern that does not visually hide the edge of steps.  Be aware of all pets. OTHER HOME SAFETY TIPS  Set the water temperature for 120 F (48.8 C).  Keep emergency numbers on or near the telephone.  Keep smoke detectors on every level of the home and near sleeping areas. Document Released: 10/10/2002 Document Revised: 04/20/2012  Document Reviewed: 01/09/2012 ExitCare Patient Information 2015 [employer]. This information is not intended to replace advice given to you by your health care provider. Make sure you discuss any questions you have with your health care provider.   Preventive Care for Adults Ages 65 and over  Blood pressure check.** / Every 1 to 2 years.  Lipid and cholesterol check.**/ Every 5 years beginning at age 20.  Lung cancer screening. / Every year if you are aged 55-80 years and have a 30-pack-year history of smoking and currently smoke or have quit within the past 15 years. Yearly screening is stopped once you have quit smoking for at least 15 years or develop a health problem that would prevent you from having lung cancer treatment.  Fecal occult blood test (FOBT) of stool. / Every year beginning at age 50 and continuing until age 75. You may not have to do this test if you get a colonoscopy every 10 years.  Flexible sigmoidoscopy** or colonoscopy.** / Every 5 years for a flexible sigmoidoscopy or every 10 years for a colonoscopy beginning at age 50 and continuing until age 75.  Hepatitis C blood test.** / For all people born from 1945 through 1965 and any individual with known risks for hepatitis C.  Abdominal aortic aneurysm (AAA) screening.** / A one-time screening for ages 65 to 75 years who are current or former smokers.  Skin self-exam. / Monthly.  Influenza vaccine. / Every year.  Tetanus, diphtheria, and acellular pertussis (Tdap/Td) vaccine.** / 1 dose of Td every 10 years.  Varicella vaccine.** / Consult your health care provider.  Zoster vaccine.** / 1 dose for adults aged 60 years or older.  Pneumococcal 13-valent conjugate (PCV13) vaccine.** / Consult your health care provider.  Pneumococcal polysaccharide (PPSV23) vaccine.** / 1 dose for all adults aged 65 years and older.  Meningococcal vaccine.** / Consult your health care provider.  Hepatitis A vaccine.** /  Consult your health care provider.  Hepatitis B vaccine.** / Consult your health care provider.  Haemophilus influenzae type b (Hib) vaccine.** / Consult your health care provider. **Family history and personal history of risk and conditions may change your health care provider's recommendations. Document Released: 12/16/2001 Document Revised: 10/25/2013 Document Reviewed: 03/17/2011 ExitCare Patient Information 2015 [employer]. This information is not intended to replace advice given to you by your health care provider. Make sure you discuss any questions you have with your health care provider.   

## 2016-08-07 NOTE — Progress Notes (Signed)
Subjective:    Patient ID: [name], female    DOB: August 20, 1930, 82 y.o.   MRN: [id number]  DOS:  08/07/2016 Type of visit - description : CPX Interval history:Had a right hip replacement, recuperating uneventfully, back home, doing her ADLs with some difficulty but able to state independent. Pain at this point is managed with Tylenol.     Review of Systems Constitutional: No fever. No chills. No unexplained wt changes. No unusual sweats  HEENT: No dental problems, no ear discharge, no facial swelling, no voice changes. No eye discharge, no eye  redness , no  intolerance to light   Respiratory: No wheezing , no  difficulty breathing. No cough , no mucus production  Cardiovascular: No CP, no leg swelling , no  Palpitations  GI: no nausea, no vomiting, no diarrhea , no  abdominal pain.  No blood in the stools. No dysphagia, no odynophagia    Endocrine: No polyphagia, no polyuria , no polydipsia  GU: No dysuria, gross hematuria, difficulty urinating. No urinary urgency, no frequency.  Musculoskeletal: No joint swellings or unusual aches or pains  Skin: No change in the color of the skin, palor , no  Rash  Allergic, immunologic: No environmental allergies , no  food allergies  Neurological: No dizziness no  syncope. No headaches. No diplopia, no slurred, no slurred speech, no motor deficits, no facial  Numbness  Hematological: No enlarged lymph nodes, no easy bruising , no unusual bleedings  Psychiatry: No suicidal ideas, no hallucinations, no beavior problems, no confusion.  No unusual/severe anxiety, no depression   Past Medical History:  Diagnosis Date  . Abnormal breast exam 2004   MMG neg (eval by surgeon neg)  . Anemia   . Arthritis    "knees" (07/11/2014)  . History of blood transfusion    "related to a surgery, I think"  . Hx SBO 2004 , 2007, 2012    exploratory surgery 9-12   . Iritis 2005  . Osteoarthritis   . Osteopenia   . SBO (small bowel  obstruction) 07-2014  . Type II diabetes mellitus ([place])     Past Surgical History:  Procedure Laterality Date  . APPENDECTOMY    . CATARACT EXTRACTION Bilateral   . DILATION AND CURETTAGE OF UTERUS    . EXPLORATORY LAPAROTOMY  07/2011   SBO  . JOINT REPLACEMENT    . TOTAL ABDOMINAL HYSTERECTOMY  1970's   for bleeding, no cancer  . TOTAL HIP ARTHROPLASTY Right 06/23/2016   Procedure: TOTAL HIP ARTHROPLASTY ANTERIOR APPROACH;  Surgeon: Dorna Leitz, MD;  Location: [facility];  Service: Orthopedics;  Laterality: Right;  . TOTAL KNEE ARTHROPLASTY Right 10/2010    Social History   Social History  . Marital status: Widowed    Spouse name: N/A  . Number of children: 2  . Years of education: N/A   Occupational History  . retired      Academic librarian    Social History Main Topics  . Smoking status: Never Smoker  . Smokeless tobacco: Never Used  . Alcohol use No  . Drug use: No  . Sexual activity: No   Other Topics Concern  . Not on file   Social History Narrative   Lives by herself, her family is in [place], lost 2 sisters               Family History  Problem Relation Age of Onset  . Diabetes Father   . Breast cancer Sister   .  Sudden death Sister   . Heart attack Neg Hx   . Colon cancer Neg Hx        Medication List       Accurate as of 08/07/16  9:58 AM. Always use your most recent med list.          acetaminophen 650 MG CR tablet Commonly known as:  TYLENOL Take 650-1,300 mg by mouth every 8 (eight) hours as needed for pain.   aspirin EC 81 MG tablet Take 81 mg by mouth daily.   CALCIUM-MAGNESUIUM-ZINC PO Take 1 tablet by mouth daily.   CENTRUM SILVER PO Take 1 capsule by mouth daily.   cholecalciferol 1000 units tablet Commonly known as:  VITAMIN D Take 2,000 Units by mouth daily.   Fish Oil 1000 MG Caps Take 2 capsules by mouth daily.   HYDROcodone-acetaminophen 5-325 MG tablet Commonly known as:  NORCO Take 1-2 tablets by mouth every 6 (six)  hours as needed for severe pain.   metFORMIN 500 MG tablet Commonly known as:  GLUCOPHAGE Take 1 tablet (500 mg total) by mouth 2 (two) times daily with a meal.   pravastatin 10 MG tablet Commonly known as:  PRAVACHOL Take 1 tablet (10 mg total) by mouth at bedtime.          Objective:   Physical Exam BP 124/76 (BP Location: Left Arm, Patient Position: Sitting, Cuff Size: Normal)   Pulse 79   Temp 98 F (36.7 C) (Oral)   Resp 14   Ht 5\' 5"  (1.651 m)   Wt 183 lb 2 oz (83.1 kg)   SpO2 99%   BMI 30.47 kg/m  General:   Well developed, well nourished . NAD.  HEENT:  Normocephalic . Face symmetric, atraumatic Lungs:  CTA B Normal respiratory effort, no intercostal retractions, no accessory muscle use. Heart: RRR,  no murmur.  no pretibial edema bilaterally  Abdomen: Deferred, hard for her  to get on the table  Skin: Not pale. Not jaundice Neurologic:  alert & oriented X3.  Speech normal, gait assisted by a cane with some difficulty Psych--  Cognition and judgment appear intact.  Cooperative with normal attention span and concentration.  Behavior appropriate. No anxious or depressed appearing.    Assessment & Plan:   Assessment DM  hyperlipidemia Osteopenia-- dexa 05-2015 T score -1.4 Osteoarthritis Iritis Multiple SBO, exploratory surgery 2012  PLAN: DM: On metformin, check a BMP, last A1c satisfactory Hyperlipidemia: Based on last FLP, Pravachol was increased to 20 mg 2 tablets daily. Check a FLP, AST, ALT  DJD: Recuperating well from by right hip replacement 2 months ago approximately, + postop anemia, checking CBC RTC 4

## 2016-08-12 DIAGNOSIS — M25551 Pain in right hip: Secondary | ICD-10-CM | POA: Diagnosis not present

## 2016-08-12 DIAGNOSIS — Z96641 Presence of right artificial hip joint: Secondary | ICD-10-CM | POA: Diagnosis not present

## 2016-08-12 DIAGNOSIS — R262 Difficulty in walking, not elsewhere classified: Secondary | ICD-10-CM | POA: Diagnosis not present

## 2016-08-19 ENCOUNTER — Telehealth: Payer: Self-pay | Admitting: Internal Medicine

## 2016-08-19 MED ORDER — PRAVASTATIN SODIUM 20 MG PO TABS: 40.0000 mg | ORAL_TABLET | Freq: Every day | ORAL | 5 refills | Status: DC

## 2016-08-19 NOTE — Telephone Encounter (Signed)
Self. Refill request for pravastatin. She would like to know if she can receive 20 MG because she is going out of town.     CB: [id number]    Pharmacy: [id number] - [place], [place]

## 2016-08-19 NOTE — Telephone Encounter (Signed)
Rx sent 

## 2016-08-20 DIAGNOSIS — E119 Type 2 diabetes mellitus without complications: Secondary | ICD-10-CM | POA: Diagnosis not present

## 2016-08-20 DIAGNOSIS — H04123 Dry eye syndrome of bilateral lacrimal glands: Secondary | ICD-10-CM | POA: Diagnosis not present

## 2016-08-20 DIAGNOSIS — Z961 Presence of intraocular lens: Secondary | ICD-10-CM | POA: Diagnosis not present

## 2016-08-20 DIAGNOSIS — H40019 Open angle with borderline findings, low risk, unspecified eye: Secondary | ICD-10-CM | POA: Diagnosis not present

## 2016-08-20 DIAGNOSIS — H26493 Other secondary cataract, bilateral: Secondary | ICD-10-CM | POA: Diagnosis not present

## 2016-08-20 LAB — HM DIABETES EYE EXAM

## 2016-08-26 MED ORDER — PRAVASTATIN SODIUM 20 MG PO TABS: 20.0000 mg | ORAL_TABLET | Freq: Every day | ORAL | 5 refills | Status: DC

## 2016-08-26 MED ORDER — PRAVASTATIN SODIUM 40 MG PO TABS: 40.0000 mg | ORAL_TABLET | Freq: Every day | ORAL | 5 refills | Status: DC

## 2016-08-26 NOTE — Addendum Note (Signed)
Addended byDamita Dunnings D on: 08/26/2016 05:19 PM   Modules accepted: Orders

## 2016-08-26 NOTE — Telephone Encounter (Signed)
Spoke w/ [name] at [facility]. Informed of PCP recommendations. Informed I would call and inform Pt.    Spoke w/ Pt, she informed me she has been taking 2 tablets of Pravastatin 10mg  and was informed from labs on 08/07/2016 to continue medication. Per PCP, to prevent further confusion. Pt to continue on 20mg  of Pravastatin and will adjust meds at next lab check if needed.

## 2016-08-26 NOTE — Telephone Encounter (Signed)
Spoke w/ Marita Kansas at [facility]. Again informed her that we are changing back to Pravastatin 20mg  1 tab daily. Marita Kansas verbalized understanding.

## 2016-08-26 NOTE — Telephone Encounter (Signed)
[facility] needing clarification on Pravastatin dosage. Informed of last OV notes from 08/07/2016 (see below):    PLAN: DM: On metformin, check a BMP, last A1c satisfactory Hyperlipidemia: Based on last FLP, Pravachol was increased to 20 mg 2 tablets daily. Check a FLP, AST, ALT  DJD: Recuperating well from by right hip replacement 2 months ago approximately, + postop anemia, checking CBC RTC 4    However, informed I would send message to PCP for clarification and return call.

## 2016-08-26 NOTE — Telephone Encounter (Signed)
Patient is supposed to be taking  pravachol 20 mg 2 tablets daily, okay to refill that way or 40 mg tablets 1 tablet daily.

## 2016-08-27 ENCOUNTER — Encounter: Payer: Medicare Other | Admitting: Internal Medicine

## 2016-10-09 ENCOUNTER — Telehealth: Payer: Self-pay | Admitting: Internal Medicine

## 2016-10-09 ENCOUNTER — Encounter: Payer: Self-pay | Admitting: Internal Medicine

## 2016-10-09 NOTE — Telephone Encounter (Signed)
05/22/15 PR PPPS, SUBSEQ VISIT [id number] lvm advising patient to schedule medicare wellness appointment

## 2016-10-30 ENCOUNTER — Other Ambulatory Visit: Payer: Self-pay | Admitting: Internal Medicine

## 2016-11-28 ENCOUNTER — Other Ambulatory Visit: Payer: Self-pay | Admitting: Internal Medicine

## 2016-12-05 ENCOUNTER — Encounter: Payer: Self-pay | Admitting: Internal Medicine

## 2016-12-05 ENCOUNTER — Ambulatory Visit (INDEPENDENT_AMBULATORY_CARE_PROVIDER_SITE_OTHER): Payer: Medicare Other | Admitting: Internal Medicine

## 2016-12-05 VITALS — BP 122/74 | HR 75 | Temp 98.2°F | Resp 14 | Ht 65.0 in | Wt 189.2 lb

## 2016-12-05 DIAGNOSIS — E785 Hyperlipidemia, unspecified: Secondary | ICD-10-CM

## 2016-12-05 DIAGNOSIS — E119 Type 2 diabetes mellitus without complications: Secondary | ICD-10-CM

## 2016-12-05 LAB — BASIC METABOLIC PANEL
BUN: 15 mg/dL (ref 7–25)
CO2: 28 mmol/L (ref 20–31)
Calcium: 9.3 mg/dL (ref 8.6–10.4)
Chloride: 105 mmol/L (ref 98–110)
Creat: 0.72 mg/dL (ref 0.60–0.88)
Glucose, Bld: 113 mg/dL — ABNORMAL HIGH (ref 65–99)
POTASSIUM: 4.7 mmol/L (ref 3.5–5.3)
SODIUM: 141 mmol/L (ref 135–146)

## 2016-12-05 NOTE — Patient Instructions (Signed)
GO TO THE LAB : Get the blood work     GO TO THE FRONT DESK Schedule your next appointment for a  physical exam by October 2015  Call for refills when needed

## 2016-12-05 NOTE — Progress Notes (Signed)
Pre visit review using our clinic review tool, if applicable. No additional management support is needed unless otherwise documented below in the visit note. 

## 2016-12-05 NOTE — Progress Notes (Signed)
Subjective:    Patient ID: [name], female    DOB: Jan 03, 1930, 83 y.o.   MRN: [id number]  DOS:  12/05/2016 Type of visit - description : Routine office visit Interval history: DM, on metformin, doing great with diet and exercise High cholesterol: Last labs reviewed, good compliance with medication She feels great, no concerns.   Review of Systems  Denies chest pain or difficulty breathing No nausea, vomiting, abdominal pain or blood in the stools.  Past Medical History:  Diagnosis Date  . Abnormal breast exam 2004   MMG neg (eval by surgeon neg)  . Anemia   . Arthritis    "knees" (07/11/2014)  . History of blood transfusion    "related to a surgery, I think"  . Hx SBO 2004 , 2007, 2012    exploratory surgery 9-12   . Iritis 2005  . Osteoarthritis   . Osteopenia   . SBO (small bowel obstruction) 07-2014  . Type II diabetes mellitus ([place])     Past Surgical History:  Procedure Laterality Date  . APPENDECTOMY    . CATARACT EXTRACTION Bilateral   . DILATION AND CURETTAGE OF UTERUS    . EXPLORATORY LAPAROTOMY  07/2011   SBO  . JOINT REPLACEMENT    . TOTAL ABDOMINAL HYSTERECTOMY  1970's   for bleeding, no cancer  . TOTAL HIP ARTHROPLASTY Right 06/23/2016   Procedure: TOTAL HIP ARTHROPLASTY ANTERIOR APPROACH;  Surgeon: Dorna Leitz, MD;  Location: [facility];  Service: Orthopedics;  Laterality: Right;  . TOTAL KNEE ARTHROPLASTY Right 10/2010    Social History   Social History  . Marital status: Widowed    Spouse name: N/A  . Number of children: 2  . Years of education: N/A   Occupational History  . retired      Academic librarian    Social History Main Topics  . Smoking status: Never Smoker  . Smokeless tobacco: Never Used  . Alcohol use No  . Drug use: No  . Sexual activity: No   Other Topics Concern  . Not on file   Social History Narrative   Lives by herself, her family is in [place], lost 2 sisters                Allergies as of 12/05/2016    Reactions   Lipitor [atorvastatin] Other (See Comments)   tinnitus   Zocor [simvastatin] Other (See Comments)   Ear ringing      Medication List       Accurate as of 12/05/16 11:59 PM. Always use your most recent med list.          acetaminophen 650 MG CR tablet Commonly known as:  TYLENOL Take 650-1,300 mg by mouth every 8 (eight) hours as needed for pain.   aspirin EC 81 MG tablet Take 81 mg by mouth daily.   CALCIUM-MAGNESUIUM-ZINC PO Take 1 tablet by mouth daily.   CENTRUM SILVER PO Take 1 capsule by mouth daily.   cholecalciferol 1000 units tablet Commonly known as:  VITAMIN D Take 2,000 Units by mouth daily.   Fish Oil 1000 MG Caps Take 2 capsules by mouth daily.   metFORMIN 500 MG tablet Commonly known as:  GLUCOPHAGE Take 1 tablet (500 mg total) by mouth 2 (two) times daily with a meal.   pravastatin 20 MG tablet Commonly known as:  PRAVACHOL Take 1 tablet (20 mg total) by mouth daily.   Vitamin B12 1000 MCG Tbcr Take 3,000 mcg by mouth  daily.          Objective:   Physical Exam BP 122/74 (BP Location: Left Arm, Patient Position: Sitting, Cuff Size: Normal)   Pulse 75   Temp 98.2 F (36.8 C) (Oral)   Resp 14   Ht 5\' 5"  (1.651 m)   Wt 189 lb 4 oz (85.8 kg)   SpO2 98%   BMI 31.49 kg/m  General:   Well developed, well nourished . NAD.  HEENT:  Normocephalic . Face symmetric, atraumatic Lungs:  CTA B Normal respiratory effort, no intercostal retractions, no accessory muscle use. Heart: RRR,  no murmur.  No pretibial edema bilaterally  Skin: Not pale. Not jaundice Neurologic:  alert & oriented X3.  Speech normal, gait appropriate for age and unassisted Psych--  Cognition and judgment appear intact.  Cooperative with normal attention span and concentration.  Behavior appropriate. No anxious or depressed appearing.      Assessment & Plan:   Assessment DM  hyperlipidemia Osteopenia-- dexa 05-2015   T score  -1.4 Osteoarthritis Iritis Multiple SBO, exploratory surgery 2012  PLAN: DM: Continue metformin, last A1c excellent. Check a BMP. Hyperlipidemia: Last FLP satisfactory after Pravachol dose increased. No change. DJD: Currently taking Tylenol as needed only, does not require hydrocodone. RTC 08-2017 CPX

## 2016-12-07 NOTE — Assessment & Plan Note (Signed)
DM: Continue metformin, last A1c excellent. Check a BMP. Hyperlipidemia: Last FLP satisfactory after Pravachol dose increased. No change. DJD: Currently taking Tylenol as needed only, does not require hydrocodone. RTC 08-2017 CPX

## 2016-12-09 ENCOUNTER — Other Ambulatory Visit: Payer: Self-pay

## 2016-12-09 MED ORDER — PRAVASTATIN SODIUM 20 MG PO TABS: 20.0000 mg | ORAL_TABLET | Freq: Every day | ORAL | 1 refills | Status: DC

## 2016-12-11 ENCOUNTER — Ambulatory Visit: Payer: Medicare Other | Admitting: *Deleted

## 2016-12-17 NOTE — Progress Notes (Deleted)
Pre visit review using our clinic review tool, if applicable. No additional management support is needed unless otherwise documented below in the visit note. 

## 2016-12-17 NOTE — Progress Notes (Deleted)
Subjective:   [name] is a 83 y.o. female who presents for Medicare Annual (Subsequent) preventive examination.  Review of Systems:  No ROS.  Medicare Wellness Visit.     Sleep patterns: {SX; SLEEP PATTERNS:18802::"feels rested on waking","does not get up to void","gets up *** times nightly to void","sleeps *** hours nightly"}.   Home Safety/Smoke Alarms:   Living environment; residence and Firearm Safety: {Rehab home environment / accessibility:30080::"no firearms","firearms stored safely"}. Seat Belt Safety/Bike Helmet: Wears seat belt.   Counseling:   Eye Exam-  Dental-  Female:   Pap- N/A, hysterectomy/aged out       Mammo- Aged out        Dexa scan- last 05/29/15. Osteopenia.  CCS- Aged out.     Objective:     Vitals: There were no vitals taken for this visit.  There is no height or weight on file to calculate BMI.   Tobacco History  Smoking Status  . Never Smoker  Smokeless Tobacco  . Never Used     Counseling given: Not Answered   Past Medical History:  Diagnosis Date  . Abnormal breast exam 2004   MMG neg (eval by surgeon neg)  . Anemia   . Arthritis    "knees" (07/11/2014)  . History of blood transfusion    "related to a surgery, I think"  . Hx SBO 2004 , 2007, 2012    exploratory surgery 9-12   . Iritis 2005  . Osteoarthritis   . Osteopenia   . SBO (small bowel obstruction) 07-2014  . Type II diabetes mellitus ([place])    Past Surgical History:  Procedure Laterality Date  . APPENDECTOMY    . CATARACT EXTRACTION Bilateral   . DILATION AND CURETTAGE OF UTERUS    . EXPLORATORY LAPAROTOMY  07/2011   SBO  . JOINT REPLACEMENT    . TOTAL ABDOMINAL HYSTERECTOMY  1970's   for bleeding, no cancer  . TOTAL HIP ARTHROPLASTY Right 06/23/2016   Procedure: TOTAL HIP ARTHROPLASTY ANTERIOR APPROACH;  Surgeon: Dorna Leitz, MD;  Location: [facility];  Service: Orthopedics;  Laterality: Right;  . TOTAL KNEE ARTHROPLASTY Right 10/2010   Family History    Problem Relation Age of Onset  . Diabetes Father   . Breast cancer Sister   . Sudden death Sister   . Heart attack Neg Hx   . Colon cancer Neg Hx    History  Sexual Activity  . Sexual activity: No    Outpatient Encounter Prescriptions as of 12/18/2016  Medication Sig  . acetaminophen (TYLENOL) 650 MG CR tablet Take 650-1,300 mg by mouth every 8 (eight) hours as needed for pain.  Marland Kitchen aspirin EC 81 MG tablet Take 81 mg by mouth daily.  . Calcium-Magnesium-Zinc (CALCIUM-MAGNESUIUM-ZINC PO) Take 1 tablet by mouth daily.  . cholecalciferol (VITAMIN D) 1000 units tablet Take 2,000 Units by mouth daily.  . Cyanocobalamin (VITAMIN B12) 1000 MCG TBCR Take 3,000 mcg by mouth daily.  . metFORMIN (GLUCOPHAGE) 500 MG tablet Take 1 tablet (500 mg total) by mouth 2 (two) times daily with a meal.  . Multiple Vitamins-Minerals (CENTRUM SILVER PO) Take 1 capsule by mouth daily.    . Omega-3 Fatty Acids (FISH OIL) 1000 MG CAPS Take 2 capsules by mouth daily.  . pravastatin (PRAVACHOL) 20 MG tablet Take 1 tablet (20 mg total) by mouth daily.   No facility-administered encounter medications on file as of 12/18/2016.     Activities of Daily Living In your present state of  health, do you have any difficulty performing the following activities: 08/07/2016 06/12/2016  Hearing? N N  Vision? N N  Difficulty concentrating or making decisions? N N  Walking or climbing stairs? Y Y  Dressing or bathing? N N  Doing errands, shopping? Y -  Some recent data might be hidden    Patient Care Team: Colon Branch, MD as PCP - General Inda Castle, MD as Consulting Physician (Gastroenterology) Druscilla Brownie, MD as Consulting Physician (Dermatology) Clent Jacks, MD as Consulting Physician (Ophthalmology) Cameron Sprang, MD as Consulting Physician (Neurology) Dorna Leitz, MD as Consulting Physician (Orthopedic Surgery)    Assessment:    Physical assessment deferred to PCP.  Exercise Activities and Dietary  recommendations    Diet (meal preparation, eat out, water intake, caffeinated beverages, dairy products, fruits and vegetables): {Desc; diets:16563} Breakfast: Lunch:  Dinner:      Goals    None     Fall Risk Fall Risk  08/07/2016 05/16/2016 05/02/2016 04/25/2015 05/11/2014  Falls in the past year? No Yes No No No  Number falls in past yr: - 1 - - -  Injury with Fall? - Yes - - -  Follow up - Falls prevention discussed;Falls evaluation completed - - -   Depression Screen PHQ 2/9 Scores 08/07/2016 05/02/2016 04/25/2015 05/11/2014  PHQ - 2 Score 0 0 0 0     Cognitive Function        Immunization History  Administered Date(s) Administered  . Influenza Whole 08/07/2008, 08/13/2009, 08/26/2010  . Influenza, High Dose Seasonal PF 08/05/2013, 08/07/2016  . Influenza,inj,Quad PF,36+ Mos 07/24/2014  . Influenza-Unspecified 07/05/2015  . Pneumococcal Conjugate-13 05/22/2015  . Pneumococcal Polysaccharide-23 11/04/2003, 01/03/2009  . Td 12/01/2007  . Zoster 02/24/2013   Screening Tests Health Maintenance  Topic Date Due  . HEMOGLOBIN A1C  02/05/2017  . FOOT EXAM  05/02/2017  . URINE MICROALBUMIN  05/12/2017  . DEXA SCAN  05/28/2017  . OPHTHALMOLOGY EXAM  08/20/2017  . TETANUS/TDAP  11/30/2017  . INFLUENZA VACCINE  Completed  . ZOSTAVAX  Completed  . PNA vac Low Risk Adult  Completed      Plan:    Follow-up w/ PCP as scheduled.  During the course of the visit the patient was educated and counseled about the following appropriate screening and preventive services:   Vaccines to include Pneumoccal, Influenza, Hepatitis B, Td, Zostavax, HCV  Cardiovascular Disease  Colorectal cancer screening  Bone density screening  Diabetes screening  Glaucoma screening  Mammography/PAP  Nutrition counseling   Patient Instructions (the written plan) was given to the patient.   Dorrene German, RN  12/17/2016

## 2016-12-18 ENCOUNTER — Ambulatory Visit: Payer: Medicare Other | Admitting: *Deleted

## 2016-12-26 ENCOUNTER — Other Ambulatory Visit: Payer: Self-pay | Admitting: Internal Medicine

## 2017-05-11 ENCOUNTER — Encounter: Payer: Self-pay | Admitting: Internal Medicine

## 2017-05-11 ENCOUNTER — Ambulatory Visit (INDEPENDENT_AMBULATORY_CARE_PROVIDER_SITE_OTHER): Payer: Medicare Other | Admitting: Internal Medicine

## 2017-05-11 ENCOUNTER — Ambulatory Visit (HOSPITAL_BASED_OUTPATIENT_CLINIC_OR_DEPARTMENT_OTHER)
Admission: RE | Admit: 2017-05-11 | Discharge: 2017-05-11 | Disposition: A | Discharge status: Home / Self Care | Payer: Medicare Other | Source: Ambulatory Visit | Attending: Internal Medicine | Admitting: Internal Medicine

## 2017-05-11 VITALS — BP 132/78 | HR 77 | Temp 97.8°F | Resp 14 | Ht 65.0 in | Wt 188.1 lb

## 2017-05-11 DIAGNOSIS — I700 Atherosclerosis of aorta: Secondary | ICD-10-CM | POA: Diagnosis not present

## 2017-05-11 DIAGNOSIS — M545 Low back pain, unspecified: Secondary | ICD-10-CM

## 2017-05-11 DIAGNOSIS — M47816 Spondylosis without myelopathy or radiculopathy, lumbar region: Secondary | ICD-10-CM | POA: Diagnosis not present

## 2017-05-11 DIAGNOSIS — E119 Type 2 diabetes mellitus without complications: Secondary | ICD-10-CM

## 2017-05-11 DIAGNOSIS — Z96641 Presence of right artificial hip joint: Secondary | ICD-10-CM | POA: Diagnosis not present

## 2017-05-11 LAB — CBC WITH DIFFERENTIAL/PLATELET
Basophils Absolute: 0 10*3/uL (ref 0.0–0.1)
Basophils Relative: 0.7 % (ref 0.0–3.0)
Eosinophils Absolute: 0.1 10*3/uL (ref 0.0–0.7)
Eosinophils Relative: 1.8 % (ref 0.0–5.0)
HCT: 36.1 % (ref 36.0–46.0)
Hemoglobin: 11.7 g/dL — ABNORMAL LOW (ref 12.0–15.0)
Lymphocytes Relative: 45.1 % (ref 12.0–46.0)
Lymphs Abs: 3.3 10*3/uL (ref 0.7–4.0)
MCHC: 32.3 g/dL (ref 30.0–36.0)
MCV: 84 fl (ref 78.0–100.0)
Monocytes Absolute: 0.6 10*3/uL (ref 0.1–1.0)
Monocytes Relative: 8.4 % (ref 3.0–12.0)
Neutro Abs: 3.2 10*3/uL (ref 1.4–7.7)
Neutrophils Relative %: 44 % (ref 43.0–77.0)
Platelets: 225 10*3/uL (ref 150.0–400.0)
RBC: 4.3 Mil/uL (ref 3.87–5.11)
RDW: 14.3 % (ref 11.5–15.5)
WBC: 7.3 10*3/uL (ref 4.0–10.5)

## 2017-05-11 LAB — COMPREHENSIVE METABOLIC PANEL
ALT: 11 U/L (ref 0–35)
AST: 15 U/L (ref 0–37)
Albumin: 4.3 g/dL (ref 3.5–5.2)
Alkaline Phosphatase: 56 U/L (ref 39–117)
BUN: 14 mg/dL (ref 6–23)
CO2: 30 mEq/L (ref 19–32)
Calcium: 9.9 mg/dL (ref 8.4–10.5)
Chloride: 104 mEq/L (ref 96–112)
Creatinine, Ser: 0.79 mg/dL (ref 0.40–1.20)
GFR: 89.02 mL/min (ref >60.00)
Glucose, Bld: 98 mg/dL (ref 70–99)
Potassium: 4.9 mEq/L (ref 3.5–5.1)
Sodium: 140 mEq/L (ref 135–145)
Total Bilirubin: 0.7 mg/dL (ref 0.2–1.2)
Total Protein: 7.6 g/dL (ref 6.0–8.3)

## 2017-05-11 LAB — SEDIMENTATION RATE: Sed Rate: 20 mm/hr (ref 0–30)

## 2017-05-11 MED ORDER — HYDROCODONE-ACETAMINOPHEN 5-325 MG PO TABS: 1.0000 | ORAL_TABLET | Freq: Three times a day (TID) | ORAL | 0 refills | Status: DC | PRN

## 2017-05-11 NOTE — Progress Notes (Signed)
Subjective:    Patient ID: [name], female    DOB: [birth year], 83 y.o.   MRN: [id number]  DOS:  05/11/2017 Type of visit - description : Acute visit Interval history: Chief complaint is back pain. She has on and off back pain, worse in the last 4 months, Tylenol use to help but here lately has not been working well for her. Pain is located at the low back, worse early in the morning when she wakes up, make it hard to walk; as the day goes by she feels slightly better. It radiates  down to the hips and to the posterior side of the legs bilaterally.   Review of Systems Denies fever chills No injuries She has DJD @ other joints but the other aches are at baseline. History of iritis, no recent eye  symptoms Denies bladder or bowel incontinence or lower extremities paresthesias  Past Medical History:  Diagnosis Date  . Abnormal breast exam 2004   MMG neg (eval by surgeon neg)  . Anemia   . Arthritis    "knees" (07/11/2014)  . History of blood transfusion    "related to a surgery, I think"  . Hx SBO 2004 , 2007, 2012    exploratory surgery 9-12   . Iritis 2005  . Osteoarthritis   . Osteopenia   . SBO (small bowel obstruction) ([facility]) 07-2014  . Type II diabetes mellitus ([place])     Past Surgical History:  Procedure Laterality Date  . APPENDECTOMY    . CATARACT EXTRACTION Bilateral   . DILATION AND CURETTAGE OF UTERUS    . EXPLORATORY LAPAROTOMY  07/2011   SBO  . JOINT REPLACEMENT    . TOTAL ABDOMINAL HYSTERECTOMY  1970's   for bleeding, no cancer  . TOTAL HIP ARTHROPLASTY Right 06/23/2016   Procedure: TOTAL HIP ARTHROPLASTY ANTERIOR APPROACH;  Surgeon: Dorna Leitz, MD;  Location: [facility];  Service: Orthopedics;  Laterality: Right;  . TOTAL KNEE ARTHROPLASTY Right 10/2010    Social History   Social History  . Marital status: Widowed    Spouse name: N/A  . Number of children: 2  . Years of education: N/A   Occupational History  . retired      Academic librarian     Social History Main Topics  . Smoking status: Never Smoker  . Smokeless tobacco: Never Used  . Alcohol use No  . Drug use: No  . Sexual activity: No   Other Topics Concern  . Not on file   Social History Narrative   Lives by herself, her family is in [place], lost 2 sisters                Allergies as of 05/11/2017      Reactions   Lipitor [atorvastatin] Other (See Comments)   tinnitus   Zocor [simvastatin] Other (See Comments)   Ear ringing      Medication List       Accurate as of 05/11/17 11:59 PM. Always use your most recent med list.          acetaminophen 650 MG CR tablet Commonly known as:  TYLENOL Take 650-1,300 mg by mouth every 8 (eight) hours as needed for pain.   aspirin EC 81 MG tablet Take 81 mg by mouth daily.   CALCIUM-MAGNESUIUM-ZINC PO Take 1 tablet by mouth daily.   CENTRUM SILVER PO Take 1 capsule by mouth daily.   cholecalciferol 1000 units tablet Commonly known as:  VITAMIN D Take  2,000 Units by mouth daily.   Fish Oil 1000 MG Caps Take 2 capsules by mouth daily.   HYDROcodone-acetaminophen 5-325 MG tablet Commonly known as:  NORCO/VICODIN Take 1-2 tablets by mouth every 8 (eight) hours as needed.   metFORMIN 500 MG tablet Commonly known as:  GLUCOPHAGE Take 1 tablet (500 mg total) by mouth 2 (two) times daily with a meal.   pravastatin 20 MG tablet Commonly known as:  PRAVACHOL Take 1 tablet (20 mg total) by mouth daily.   Vitamin B12 1000 MCG Tbcr Take 3,000 mcg by mouth daily.          Objective:   Physical Exam BP 132/78 (BP Location: Left Arm, Patient Position: Sitting, Cuff Size: Normal)   Pulse 77   Temp 97.8 F (36.6 C) (Oral)   Resp 14   Ht 5\' 5"  (1.651 m)   Wt 188 lb 2 oz (85.3 kg)   SpO2 98%   BMI 31.31 kg/m  General:   Well developed, well nourished . NAD.  HEENT:  Normocephalic . Face symmetric, atraumatic Abdomen:  Not distended, soft, non-tender. No rebound or rigidity. No bruit Lower  extremities: No edema, normal femoral and pedal pulses. Skin: Not pale. Not jaundice Neurologic:  alert & oriented X3.  Speech normal, gait assisted by a cane. Motor exam symmetric, DTRs symmetric (absent ankle jerks bilaterally)  MSK: Slightly tender at both SI joints and L spine. Mild puffiness over the L-spine? No clear-cut mass, redness or warmness. Psych--  Cognition and judgment appear intact.  Cooperative with normal attention span and concentration.  Behavior appropriate. No anxious or depressed appearing.    Assessment & Plan:    Assessment DM  hyperlipidemia Osteopenia-- dexa 05-2015   T score -1.4 Osteoarthritis Iritis Multiple SBO, exploratory surgery 2012  PLAN: Back pain: Back pain as described above could be due to DJD, spinal stenosis. Vascular exam is normal. She is TTP at the lumbar spine. Plan: X-ray, CBC, sedimentation rate. Refer to orthopedic. Continue Tylenol. A small amount of hydrocodone also prescribed. Recommend to take it mostly at night as it may cause drowsiness DM: Seems well-controlled, continue metformin, check a CMP RTC 08-2017 CPX

## 2017-05-11 NOTE — Patient Instructions (Signed)
GO TO THE LAB : Get the blood work     GO TO THE FRONT DESK Schedule your next appointment for a   physical exam by October 2018    STOP BY THE FIRST FLOOR:  get the XR

## 2017-05-11 NOTE — Progress Notes (Signed)
Pre visit review using our clinic review tool, if applicable. No additional management support is needed unless otherwise documented below in the visit note. 

## 2017-05-12 NOTE — Assessment & Plan Note (Signed)
Back pain: Back pain as described above could be due to DJD, spinal stenosis. Vascular exam is normal. She is TTP at the lumbar spine. Plan: X-ray, CBC, sedimentation rate. Refer to orthopedic. Continue Tylenol. A small amount of hydrocodone also prescribed. Recommend to take it mostly at night as it may cause drowsiness DM: Seems well-controlled, continue metformin, check a CMP RTC 08-2017 CPX

## 2017-05-20 ENCOUNTER — Encounter: Payer: Self-pay | Admitting: Internal Medicine

## 2017-05-20 ENCOUNTER — Telehealth: Payer: Self-pay | Admitting: Internal Medicine

## 2017-05-20 DIAGNOSIS — Z79891 Long term (current) use of opiate analgesic: Secondary | ICD-10-CM | POA: Diagnosis not present

## 2017-05-20 MED ORDER — HYDROCODONE-ACETAMINOPHEN 5-325 MG PO TABS: 1.0000 | ORAL_TABLET | Freq: Three times a day (TID) | ORAL | 0 refills | Status: DC | PRN

## 2017-05-20 NOTE — Telephone Encounter (Signed)
Please inform Pt that Rx has been placed at front desk for pick up at her convenience. Thank you.  

## 2017-05-20 NOTE — Telephone Encounter (Signed)
Rx printed, awaiting MD signature.  

## 2017-05-20 NOTE — Telephone Encounter (Signed)
Patient informed. 

## 2017-05-20 NOTE — Telephone Encounter (Signed)
Okay #60, no refills. UDS and contract needed

## 2017-05-20 NOTE — Telephone Encounter (Signed)
Pt is requesting hydrocodone 5-325mg .   Last OV: 05/11/2017 Last Fill: 05/11/2017 #30 and 0RF UDS: None  Will need UDS and contract at time of pick up.  [place] Controlled Substance Database printed; no issues noted.  Please advise.

## 2017-05-20 NOTE — Telephone Encounter (Signed)
°  Relation to LU:NGBM Call back [phone] Pharmacy: [id number] - [place], [place] [phone] (Phone) [phone] (Fax)     Reason for call:  Patient requesting a refill HYDROcodone-acetaminophen (NORCO/VICODIN) 5-325 MG tablet

## 2017-05-26 ENCOUNTER — Telehealth: Payer: Self-pay

## 2017-05-26 NOTE — Telephone Encounter (Signed)
UDS: 05/20/2017  Hydrocodone-detected   Low risk per PCP 05/26/2017

## 2017-06-30 ENCOUNTER — Telehealth: Payer: Self-pay | Admitting: Internal Medicine

## 2017-06-30 MED ORDER — ONETOUCH ULTRASOFT LANCETS MISC: 12 refills | Status: DC

## 2017-06-30 MED ORDER — GLUCOSE BLOOD VI STRP: ORAL_STRIP | 12 refills | Status: DC

## 2017-06-30 NOTE — Telephone Encounter (Signed)
°  Relation to BT:YOMA Call back number:[phone] Pharmacy[id number] - [place], [place] [phone] (Phone) [phone] (Fax)     Reason for call:  Patient requesting diabetic strips for one touch ultra, patient states she never had to call and now she's completely out.

## 2017-06-30 NOTE — Telephone Encounter (Signed)
[facility] test [facility]

## 2017-07-08 ENCOUNTER — Other Ambulatory Visit: Payer: Self-pay

## 2017-07-08 MED ORDER — ONETOUCH DELICA LANCING DEV MISC: 0 refills | Status: DC

## 2017-07-10 ENCOUNTER — Encounter (HOSPITAL_COMMUNITY): Payer: Self-pay | Admitting: *Deleted

## 2017-07-10 ENCOUNTER — Inpatient Hospital Stay (HOSPITAL_COMMUNITY)
Admission: EM | Admit: 2017-07-10 | Discharge: 2017-07-13 | DRG: 390 | Disposition: A | Discharge status: Home / Self Care | Payer: Medicare Other | Attending: Internal Medicine | Admitting: Internal Medicine

## 2017-07-10 ENCOUNTER — Inpatient Hospital Stay (HOSPITAL_COMMUNITY): Payer: Medicare Other

## 2017-07-10 ENCOUNTER — Emergency Department (HOSPITAL_COMMUNITY): Payer: Medicare Other

## 2017-07-10 DIAGNOSIS — M1990 Unspecified osteoarthritis, unspecified site: Secondary | ICD-10-CM | POA: Diagnosis not present

## 2017-07-10 DIAGNOSIS — Z96651 Presence of right artificial knee joint: Secondary | ICD-10-CM | POA: Diagnosis present

## 2017-07-10 DIAGNOSIS — D649 Anemia, unspecified: Secondary | ICD-10-CM | POA: Diagnosis present

## 2017-07-10 DIAGNOSIS — E876 Hypokalemia: Secondary | ICD-10-CM | POA: Diagnosis present

## 2017-07-10 DIAGNOSIS — R1110 Vomiting, unspecified: Secondary | ICD-10-CM | POA: Diagnosis not present

## 2017-07-10 DIAGNOSIS — Z0189 Encounter for other specified special examinations: Secondary | ICD-10-CM

## 2017-07-10 DIAGNOSIS — Z79899 Other long term (current) drug therapy: Secondary | ICD-10-CM | POA: Diagnosis not present

## 2017-07-10 DIAGNOSIS — Z9049 Acquired absence of other specified parts of digestive tract: Secondary | ICD-10-CM | POA: Diagnosis not present

## 2017-07-10 DIAGNOSIS — K56600 Partial intestinal obstruction, unspecified as to cause: Secondary | ICD-10-CM | POA: Diagnosis not present

## 2017-07-10 DIAGNOSIS — K56609 Unspecified intestinal obstruction, unspecified as to partial versus complete obstruction: Secondary | ICD-10-CM | POA: Diagnosis not present

## 2017-07-10 DIAGNOSIS — Z96641 Presence of right artificial hip joint: Secondary | ICD-10-CM | POA: Diagnosis not present

## 2017-07-10 DIAGNOSIS — Z885 Allergy status to narcotic agent status: Secondary | ICD-10-CM

## 2017-07-10 DIAGNOSIS — Z888 Allergy status to other drugs, medicaments and biological substances status: Secondary | ICD-10-CM

## 2017-07-10 DIAGNOSIS — E1140 Type 2 diabetes mellitus with diabetic neuropathy, unspecified: Secondary | ICD-10-CM

## 2017-07-10 DIAGNOSIS — E119 Type 2 diabetes mellitus without complications: Secondary | ICD-10-CM | POA: Diagnosis present

## 2017-07-10 DIAGNOSIS — K5730 Diverticulosis of large intestine without perforation or abscess without bleeding: Secondary | ICD-10-CM | POA: Diagnosis not present

## 2017-07-10 DIAGNOSIS — Z4682 Encounter for fitting and adjustment of non-vascular catheter: Secondary | ICD-10-CM | POA: Diagnosis not present

## 2017-07-10 DIAGNOSIS — K56699 Other intestinal obstruction unspecified as to partial versus complete obstruction: Secondary | ICD-10-CM | POA: Diagnosis not present

## 2017-07-10 DIAGNOSIS — Z7984 Long term (current) use of oral hypoglycemic drugs: Secondary | ICD-10-CM

## 2017-07-10 DIAGNOSIS — M8580 Other specified disorders of bone density and structure, unspecified site: Secondary | ICD-10-CM | POA: Diagnosis not present

## 2017-07-10 DIAGNOSIS — R109 Unspecified abdominal pain: Secondary | ICD-10-CM | POA: Diagnosis not present

## 2017-07-10 LAB — COMPREHENSIVE METABOLIC PANEL
ALT: 17 U/L (ref 14–54)
ANION GAP: 11 (ref 5–15)
AST: 22 U/L (ref 15–41)
Albumin: 4.1 g/dL (ref 3.5–5.0)
Alkaline Phosphatase: 55 U/L (ref 38–126)
BILIRUBIN TOTAL: 0.7 mg/dL (ref 0.3–1.2)
BUN: 17 mg/dL (ref 6–20)
CHLORIDE: 100 mmol/L — AB (ref 101–111)
CO2: 25 mmol/L (ref 22–32)
Calcium: 9.8 mg/dL (ref 8.9–10.3)
Creatinine, Ser: 0.81 mg/dL (ref 0.44–1.00)
Glucose, Bld: 164 mg/dL — ABNORMAL HIGH (ref 65–99)
POTASSIUM: 4.3 mmol/L (ref 3.5–5.1)
Sodium: 136 mmol/L (ref 135–145)
TOTAL PROTEIN: 7.6 g/dL (ref 6.5–8.1)

## 2017-07-10 LAB — CBG MONITORING, ED
GLUCOSE-CAPILLARY: 118 mg/dL — AB (ref 65–99)
GLUCOSE-CAPILLARY: 93 mg/dL (ref 65–99)
GLUCOSE-CAPILLARY: 107 mg/dL — AB (ref 65–99)

## 2017-07-10 LAB — CBC
HEMATOCRIT: 36.4 % (ref 36.0–46.0)
HEMOGLOBIN: 11.7 g/dL — AB (ref 12.0–15.0)
MCH: 26.8 pg (ref 26.0–34.0)
MCHC: 32.1 g/dL (ref 30.0–36.0)
MCV: 83.3 fL (ref 78.0–100.0)
Platelets: 251 10*3/uL (ref 150–400)
RBC: 4.37 MIL/uL (ref 3.87–5.11)
RDW: 13.9 % (ref 11.5–15.5)
WBC: 9.8 10*3/uL (ref 4.0–10.5)

## 2017-07-10 LAB — GLUCOSE, CAPILLARY
GLUCOSE-CAPILLARY: 100 mg/dL — AB (ref 65–99)
Glucose-Capillary: 96 mg/dL (ref 65–99)
Glucose-Capillary: 102 mg/dL — ABNORMAL HIGH (ref 65–99)

## 2017-07-10 MED ORDER — ONDANSETRON HCL 4 MG/2ML IJ SOLN
4.0000 mg | Freq: Once | INTRAMUSCULAR | Status: AC
  Administered 2017-07-10: 4 mg via INTRAVENOUS
  Filled 2017-07-10: qty 2

## 2017-07-10 MED ORDER — DIPHENHYDRAMINE HCL 50 MG/ML IJ SOLN: 25.0000 mg | Freq: Four times a day (QID) | INTRAMUSCULAR | Status: DC | PRN

## 2017-07-10 MED ORDER — DIATRIZOATE MEGLUMINE & SODIUM 66-10 % PO SOLN
90.0000 mL | Freq: Once | ORAL | Status: AC
  Administered 2017-07-10: 90 mL via NASOGASTRIC
  Filled 2017-07-10: qty 90

## 2017-07-10 MED ORDER — LIDOCAINE HCL (CARDIAC) 20 MG/ML IV SOLN
INTRAVENOUS | Status: AC
  Filled 2017-07-10: qty 5

## 2017-07-10 MED ORDER — LIDOCAINE VISCOUS 2 % MT SOLN
15.0000 mL | Freq: Once | OROMUCOSAL | Status: AC
  Administered 2017-07-10: 3 mL via OROMUCOSAL

## 2017-07-10 MED ORDER — ONDANSETRON HCL 4 MG/2ML IJ SOLN: 4.0000 mg | Freq: Four times a day (QID) | INTRAMUSCULAR | Status: DC | PRN

## 2017-07-10 MED ORDER — FENTANYL CITRATE (PF) 100 MCG/2ML IJ SOLN
50.0000 ug | Freq: Once | INTRAMUSCULAR | Status: AC
  Administered 2017-07-10: 50 ug via INTRAVENOUS
  Filled 2017-07-10: qty 2

## 2017-07-10 MED ORDER — IOPAMIDOL (ISOVUE-300) INJECTION 61%
INTRAVENOUS | Status: AC
  Administered 2017-07-10: 100 mL
  Filled 2017-07-10: qty 100

## 2017-07-10 MED ORDER — ENOXAPARIN SODIUM 40 MG/0.4ML ~~LOC~~ SOLN
40.0000 mg | SUBCUTANEOUS | Status: DC
  Administered 2017-07-10 – 2017-07-12 (×3): 40 mg via SUBCUTANEOUS
  Filled 2017-07-10 (×4): qty 0.4

## 2017-07-10 MED ORDER — IOPAMIDOL (ISOVUE-M 300) INJECTION 61%: 15.0000 mL | Freq: Once | INTRAMUSCULAR | Status: DC | PRN

## 2017-07-10 MED ORDER — MORPHINE SULFATE (PF) 4 MG/ML IV SOLN: 2.0000 mg | INTRAVENOUS | Status: DC | PRN

## 2017-07-10 MED ORDER — ACETAMINOPHEN 650 MG RE SUPP
650.0000 mg | Freq: Four times a day (QID) | RECTAL | Status: DC | PRN
  Administered 2017-07-10: 650 mg via RECTAL
  Filled 2017-07-10: qty 1

## 2017-07-10 MED ORDER — IOPAMIDOL (ISOVUE-300) INJECTION 61%
INTRAVENOUS | Status: AC
  Administered 2017-07-10: 30 mL via NASOGASTRIC
  Filled 2017-07-10: qty 50

## 2017-07-10 MED ORDER — SODIUM CHLORIDE 0.9 % IV SOLN
INTRAVENOUS | Status: DC
  Administered 2017-07-10 – 2017-07-13 (×5): via INTRAVENOUS

## 2017-07-10 MED ORDER — ACETAMINOPHEN 325 MG PO TABS
650.0000 mg | ORAL_TABLET | Freq: Four times a day (QID) | ORAL | Status: DC | PRN
  Administered 2017-07-12: 650 mg via ORAL
  Filled 2017-07-10: qty 2

## 2017-07-10 MED ORDER — LIDOCAINE HCL 2 % EX GEL
1.0000 "application " | Freq: Once | CUTANEOUS | Status: AC
  Administered 2017-07-10: 1 via TOPICAL
  Filled 2017-07-10: qty 20

## 2017-07-10 MED ORDER — LIDOCAINE VISCOUS 2 % MT SOLN
OROMUCOSAL | Status: AC
  Administered 2017-07-10: 3 mL via OROMUCOSAL
  Filled 2017-07-10: qty 15

## 2017-07-10 MED ORDER — INSULIN ASPART 100 UNIT/ML ~~LOC~~ SOLN
0.0000 [IU] | SUBCUTANEOUS | Status: DC
  Administered 2017-07-11 – 2017-07-12 (×3): 2 [IU] via SUBCUTANEOUS

## 2017-07-10 NOTE — ED Triage Notes (Signed)
Pt c/o right sided abd pain since 5pm with NV; has been having small amounts of BM; hx of SBO

## 2017-07-10 NOTE — ED Notes (Signed)
DG called sts procedure completed and successful. Pt okay to go upstairs to [place]- floor updated.

## 2017-07-10 NOTE — Progress Notes (Signed)
Pt admitted from the ED with a small bowel obstruction. NGT placed in IR after unsuccessful attempts in the ED. Connected to low wall suction at present. Gastrogaffin to be administered this evening

## 2017-07-10 NOTE — ED Notes (Signed)
Diagnostic xray called notified pt is next to get procedure- pt updated.

## 2017-07-10 NOTE — ED Notes (Signed)
R nare numbed with lidocaine jelly, hurricaine spray administered; attempted x 2 to R nare with 42f NG tube, unable to place.  PA paged.

## 2017-07-10 NOTE — ED Provider Notes (Signed)
[facility] DEPT Provider Note   CSN: [id number] Arrival date & time: 07/10/17  0145     History   Chief Complaint Chief Complaint  Patient presents with  . Abdominal Pain    HPI [name] is a 83 y.o. female.  The history is provided by the patient and a relative.  Abdominal Pain   This is a new problem. The current episode started 12 to 24 hours ago. The problem occurs constantly. The problem has been gradually worsening. The pain is located in the generalized abdominal region. The pain is severe. Associated symptoms include nausea, vomiting and constipation. Pertinent negatives include fever, diarrhea and hematochezia. The symptoms are aggravated by certain positions and palpation. Nothing relieves the symptoms.  pt reports onset of abdominal pain/nausea/vomiting over past 24 hrs She has long h/o previous obstructions, she thinks it has returned   Past Medical History:  Diagnosis Date  . Abnormal breast exam 2004   MMG neg (eval by surgeon neg)  . Anemia   . Arthritis    "knees" (07/11/2014)  . History of blood transfusion    "related to a surgery, I think"  . Hx SBO 2004 , 2007, 2012    exploratory surgery 9-12   . Iritis 2005  . Osteoarthritis   . Osteopenia   . SBO (small bowel obstruction) ([place]) 07-2014  . Type II diabetes mellitus [facility])     Patient Active Problem List   Diagnosis Date Noted  . Primary osteoarthritis of right hip 06/23/2016  . PCP NOTES >>>>>>>>>>>. 05/02/2016  . Diabetes mellitus without complication ([place]) 75/64/3329  . UTI (lower urinary tract infection) 12/07/2015  . SBO (small bowel obstruction) ([place]) 12/07/2015  . Right hip pain 09/04/2015  . Left-sided tinnitus 05/23/2015  . Neuropathy 05/23/2015  . Right shoulder pain 04/26/2015  . Dizziness 04/24/2015  . Facial pain syndrome 10/23/2014  . Pulmonary nodules 07/24/2014  . Small bowel obstruction ([place]) 07/11/2014  . Special screening for malignant neoplasms, colon 04/18/2013   . Annual physical exam 08/27/2011  . Tinnitus 08/26/2010  . SEBORRHEIC KERATOSIS 05/17/2010  . Dyslipidemia 04/09/2009  . Osteoarthritis 12/21/2008  . DMII (diabetes mellitus, type 2) ([place]) 12/01/2007  . GERD 02/11/2007  . Osteopenia 02/11/2007    Past Surgical History:  Procedure Laterality Date  . APPENDECTOMY    . CATARACT EXTRACTION Bilateral   . DILATION AND CURETTAGE OF UTERUS    . EXPLORATORY LAPAROTOMY  07/2011   SBO  . JOINT REPLACEMENT    . TOTAL ABDOMINAL HYSTERECTOMY  1970's   for bleeding, no cancer  . TOTAL HIP ARTHROPLASTY Right 06/23/2016   Procedure: TOTAL HIP ARTHROPLASTY ANTERIOR APPROACH;  Surgeon: Dorna Leitz, MD;  Location: [facility];  Service: Orthopedics;  Laterality: Right;  . TOTAL KNEE ARTHROPLASTY Right 10/2010    OB History    No data available       Home Medications    Prior to Admission medications   Medication Sig Start Date End Date Taking? Authorizing Provider  acetaminophen (TYLENOL) 650 MG CR tablet Take 650-1,300 mg by mouth every 8 (eight) hours as needed for pain.    [provider]  aspirin EC 81 MG tablet Take 81 mg by mouth daily.    [provider]  Calcium-Magnesium-Zinc (CALCIUM-MAGNESUIUM-ZINC PO) Take 1 tablet by mouth daily.    [provider]  cholecalciferol (VITAMIN D) 1000 units tablet Take 2,000 Units by mouth daily.    [provider]  Cyanocobalamin (VITAMIN B12) 1000 MCG  TBCR Take 3,000 mcg by mouth daily.    [provider]  glucose blood (ONE TOUCH ULTRA TEST) test strip Check blood sugar no more than twice daily 06/30/17   Colon Branch, MD  HYDROcodone-acetaminophen (NORCO/VICODIN) 5-325 MG tablet Take 1-2 tablets by mouth every 8 (eight) hours as needed for moderate pain. 05/20/17   Colon Branch, MD  Lancet Devices (ONE TOUCH DELICA LANCING DEV) MISC To check blood sugars as directed 07/08/17   Colon Branch, MD  Lancets [facility] ULTRASOFT) lancets Check blood sugar no more than  twice daily 06/30/17   Colon Branch, MD  metFORMIN (GLUCOPHAGE) 500 MG tablet Take 1 tablet (500 mg total) by mouth 2 (two) times daily with a meal. 12/26/16   Colon Branch, MD  Multiple Vitamins-Minerals (CENTRUM SILVER PO) Take 1 capsule by mouth daily.      [provider]  Omega-3 Fatty Acids (FISH OIL) 1000 MG CAPS Take 2 capsules by mouth daily.    [provider]  pravastatin (PRAVACHOL) 20 MG tablet Take 1 tablet (20 mg total) by mouth daily. 12/09/16   Colon Branch, MD    Family History Family History  Problem Relation Age of Onset  . Diabetes Father   . Breast cancer Sister   . Sudden death Sister   . Heart attack Neg Hx   . Colon cancer Neg Hx     Social History Social History  Substance Use Topics  . Smoking status: Never Smoker  . Smokeless tobacco: Never Used  . Alcohol use No     Allergies   Lipitor [atorvastatin] and Zocor [simvastatin]   Review of Systems Review of Systems  Constitutional: Negative for fever.  Cardiovascular: Negative for chest pain.  Gastrointestinal: Positive for abdominal pain, constipation, nausea and vomiting. Negative for blood in stool, diarrhea and hematochezia.  All other systems reviewed and are negative.    Physical Exam Updated Vital Signs BP 131/68   Pulse 77   Temp 98.4 F (36.9 C)   Resp 16   SpO2 96%   Physical Exam  CONSTITUTIONAL: Elderly, uncomfortable appearing HEAD: Normocephalic/atraumatic EYES: EOMI/PERRL, no icterus ENMT: Mucous membranes moist NECK: supple no meningeal signs SPINE/BACK:entire spine nontender CV: S1/S2 noted  LUNGS: Lungs are clear to auscultation bilaterally, no apparent distress ABDOMEN: soft,diffuse moderate tenderness, no rebound or guarding GU:no cva tenderness NEURO: Pt is awake/alert/appropriate, moves all extremitiesx4.  No facial droop.   EXTREMITIES: pulses normal/equal, full ROM SKIN: warm, color normal PSYCH: anxious  ED Treatments / Results  Labs (all  labs ordered are listed, but only abnormal results are displayed) Labs Reviewed  COMPREHENSIVE METABOLIC PANEL - Abnormal; Notable for the following:       Result Value   Chloride 100 (*)    Glucose, Bld 164 (*)    All other components within normal limits  CBC - Abnormal; Notable for the following:    Hemoglobin 11.7 (*)    All other components within normal limits  URINALYSIS, ROUTINE W REFLEX MICROSCOPIC    EKG  EKG Interpretation  Date/Time:  Friday July 10 2017 02:46:04 EDT Ventricular Rate:  66 PR Interval:    QRS Duration: 88 QT Interval:  410 QTC Calculation: 430 R Axis:   33 Text Interpretation:  Sinus rhythm No significant change since last tracing Confirmed by Ripley Fraise [id number]) on 07/10/2017 2:54:35 AM       Radiology Ct Abdomen Pelvis W Contrast  Result Date: 07/10/2017 CLINICAL DATA:  83 y/o F; right-sided abdominal pain with nausea and vomiting. EXAM: CT ABDOMEN AND PELVIS WITH CONTRAST TECHNIQUE: Multidetector CT imaging of the abdomen and pelvis was performed using the standard protocol following bolus administration of intravenous contrast. CONTRAST:  145mL ISOVUE-300 IOPAMIDOL (ISOVUE-300) INJECTION 61% COMPARISON:  12/07/2015 CT abdomen and pelvis. FINDINGS: Lower chest: Minor bibasilar atelectasis. Mild coronary artery calcification. Hepatobiliary: No focal liver abnormality is seen. No gallstones, gallbladder wall thickening, or biliary dilatation. Pancreas: Unremarkable. No pancreatic ductal dilatation or surrounding inflammatory changes. Spleen: Normal in size without focal abnormality. Adrenals/Urinary Tract: Stable nodular hypertrophy of the left adrenal gland. Kidneys are normal, without renal calculi, focal lesion, or hydronephrosis. Bladder is unremarkable. Stomach/Bowel: Small bowel obstruction in a similar configuration to prior CT of abdomen and pelvis, transition point in the right mid abdomen. Mild diverticulosis of sigmoid colon. Normal  stomach. No evidence for perforation or abscess. Appendectomy. Vascular/Lymphatic: Aortic atherosclerosis. No enlarged abdominal or pelvic lymph nodes. Reproductive: Status post hysterectomy. No adnexal masses. Other: Postsurgical changes within the lower anterior abdominal wall and small paraumbilical hernia containing fat are stable. Musculoskeletal: Right hip replacement. Stable degenerative changes of the lumbar spine with prominent facet arthrosis greatest at the L4-5 level where there is multifactorial moderate to severe canal stenosis. IMPRESSION: 1. Small bowel obstruction with transition in the right mid abdomen, possibly due to adhesions, similar in configuration to prior CTs of the abdomen and pelvis. No evidence for perforation. 2. Additional stable chronic changes as above. Electronically Signed   By: Kristine Garbe M.D.   On: 07/10/2017 05:31    Procedures Procedures (including critical care time)  Medications Ordered in ED Medications  0.9 %  sodium chloride infusion (not administered)  fentaNYL (SUBLIMAZE) injection 50 mcg (not administered)  ondansetron (ZOFRAN) injection 4 mg (not administered)  morphine 4 MG/ML injection 2-4 mg (not administered)  ondansetron (ZOFRAN) injection 4 mg (4 mg Intravenous Given 07/10/17 0258)  fentaNYL (SUBLIMAZE) injection 50 mcg (50 mcg Intravenous Given 07/10/17 0259)  iopamidol (ISOVUE-300) 61 % injection (100 mLs  Contrast Given 07/10/17 0504)     Initial Impression / Assessment and Plan / ED Course  I have reviewed the triage vital signs and the nursing notes.  Pertinent labs/imaging  results that were available during my care of the patient were reviewed by me and considered in my medical decision making (see chart for details).     Pt in the ED for recurrent abd pain/nausea/vomiting CT imaging confirms small bowel obstruction Pt with diffuse tenderness, but no signs of surgical abdomen and not septic appearing Will admit to  hospitalist Pt declines NG tube at this time Patient/family updated on plan  5:48 AM D/w dr gardner for admission Will defer NG Tube for now Final Clinical Impressions(s) / ED Diagnoses   Final diagnoses:  Small bowel obstruction [facility])    New Prescriptions New Prescriptions   No medications on file     Ripley Fraise, MD 07/10/17 [phone]

## 2017-07-10 NOTE — H&P (Addendum)
History and Physical    [name] YKD:[id number] DOB: [birth year] DOA: 07/10/2017  PCP: Colon Branch, MD  Patient coming from: Home  I have personally briefly reviewed patient's old medical records in [facility]  Chief Complaint: Abd pain  HPI: [name] is a 83 y.o. female with medical history significant of prior SBO, DM2 on metformin.  Patient presents to the ED at [facility] with c/o abd pain, N/V.  Symptoms onset 5pm yesterday.  Pain throughout abdomen.  Severe.  Nothing makes better or worse.  Only small BM yesterday.  Extensive history of SBO numerous times in the past, looks like most recent was early 2017 though.   ED Course: SBO confirmed on CT scan.  Patient's pain not controlled despite fentanyl.  No vomiting in ED.  Patient declining NGT at this time.   Review of Systems: As per HPI otherwise 10 point review of systems negative.   Past Medical History:  Diagnosis Date  . Abnormal breast exam 2004   MMG neg (eval by surgeon neg)  . Anemia   . Arthritis    "knees" (07/11/2014)  . History of blood transfusion    "related to a surgery, I think"  . Hx SBO 2004 , 2007, 2012    exploratory surgery 9-12   . Iritis 2005  . Osteoarthritis   . Osteopenia   . SBO (small bowel obstruction) ([place]) 07-2014  . Type II diabetes mellitus ([place])     Past Surgical History:  Procedure Laterality Date  . APPENDECTOMY    . CATARACT EXTRACTION Bilateral   . DILATION AND CURETTAGE OF UTERUS    . EXPLORATORY LAPAROTOMY  07/2011   SBO  . JOINT REPLACEMENT    . TOTAL ABDOMINAL HYSTERECTOMY  1970's   for bleeding, no cancer  . TOTAL HIP ARTHROPLASTY Right 06/23/2016   Procedure: TOTAL HIP ARTHROPLASTY ANTERIOR APPROACH;  Surgeon: Dorna Leitz, MD;  Location: [facility];  Service: Orthopedics;  Laterality: Right;  . TOTAL KNEE ARTHROPLASTY Right 10/2010     reports that she has never smoked. She has never used smokeless tobacco. She reports that she does not drink alcohol or use  drugs.  Allergies  Allergen Reactions  . Lipitor [Atorvastatin] Other (See Comments)    tinnitus  . Zocor [Simvastatin] Other (See Comments)    Ear ringing    Family History  Problem Relation Age of Onset  . Diabetes Father   . Breast cancer Sister   . Sudden death Sister   . Heart attack Neg Hx   . Colon cancer Neg Hx      Prior to Admission medications   Medication Sig Start Date End Date Taking? Authorizing Provider  acetaminophen (TYLENOL) 650 MG CR tablet Take 650-1,300 mg by mouth every 8 (eight) hours as needed for pain.    [provider]  aspirin EC 81 MG tablet Take 81 mg by mouth daily.    [provider]  Calcium-Magnesium-Zinc (CALCIUM-MAGNESUIUM-ZINC PO) Take 1 tablet by mouth daily.    [provider]  cholecalciferol (VITAMIN D) 1000 units tablet Take 2,000 Units by mouth daily.    [provider]  Cyanocobalamin (VITAMIN B12) 1000 MCG TBCR Take 3,000 mcg by mouth daily.    [provider]  glucose blood (ONE TOUCH ULTRA TEST) test strip Check blood sugar no more than twice daily 06/30/17   Colon Branch, MD  HYDROcodone-acetaminophen (NORCO/VICODIN) 5-325 MG tablet Take 1-2 tablets by mouth every 8 (eight)  hours as needed for moderate pain. 05/20/17   Colon Branch, MD  Lancet Devices (ONE TOUCH DELICA LANCING DEV) MISC To check blood sugars as directed 07/08/17   Colon Branch, MD  Lancets [facility] ULTRASOFT) lancets Check blood sugar no more than twice daily 06/30/17   Colon Branch, MD  metFORMIN (GLUCOPHAGE) 500 MG tablet Take 1 tablet (500 mg total) by mouth 2 (two) times daily with a meal. 12/26/16   Colon Branch, MD  Multiple Vitamins-Minerals (CENTRUM SILVER PO) Take 1 capsule by mouth daily.      [provider]  Omega-3 Fatty Acids (FISH OIL) 1000 MG CAPS Take 2 capsules by mouth daily.    [provider]  pravastatin (PRAVACHOL) 20 MG tablet Take 1 tablet (20 mg total) by mouth daily. 12/09/16   Colon Branch,  MD    Physical Exam: Vitals:   07/10/17 0149 07/10/17 0247 07/10/17 0300 07/10/17 0330  BP: 128/72 128/69 134/73 131/68  Pulse: 80  72 77  Resp: 18 15 (!) 21 16  Temp: 98.4 F (36.9 C)     SpO2: 96%  99% 96%    Constitutional: NAD, calm, comfortable Eyes: PERRL, lids and conjunctivae normal ENMT: Mucous membranes are moist. Posterior pharynx clear of any exudate or lesions.Normal dentition.  Neck: normal, supple, no masses, no thyromegaly Respiratory: clear to auscultation bilaterally, no wheezing, no crackles. Normal respiratory effort. No accessory muscle use.  Cardiovascular: Regular rate and rhythm, no murmurs / rubs / gallops. No extremity edema. 2+ pedal pulses. No carotid bruits.  Abdomen: Diffuse tenderness, distention Musculoskeletal: no clubbing / cyanosis. No joint deformity upper and lower extremities. Good ROM, no contractures. Normal muscle tone.  Skin: no rashes, lesions, ulcers. No induration Neurologic: CN 2-12 grossly intact. Sensation intact, DTR normal. Strength 5/5 in all 4.  Psychiatric: Normal judgment and insight. Alert and oriented x 3. Normal mood.    Labs on Admission: I have personally reviewed following labs and imaging studies  CBC:  Recent Labs Lab 07/10/17 0154  WBC 9.8  HGB 11.7*  HCT 36.4  MCV 83.3  PLT 062   Basic Metabolic Panel:  Recent Labs Lab 07/10/17 0154  NA 136  K 4.3  CL 100*  CO2 25  GLUCOSE 164*  BUN 17  CREATININE 0.81  CALCIUM 9.8   GFR: CrCl cannot be calculated (Unknown ideal weight.). Liver Function Tests:  Recent Labs Lab 07/10/17 0154  AST 22  ALT 17  ALKPHOS 55  BILITOT 0.7  PROT 7.6  ALBUMIN 4.1   No results for input(s): LIPASE, AMYLASE in the last 168 hours. No results for input(s): AMMONIA in the last 168 hours. Coagulation Profile: No results for input(s): INR, PROTIME in the last 168 hours. Cardiac Enzymes: No results for input(s): CKTOTAL, CKMB, CKMBINDEX, TROPONINI in the last 168  hours. BNP (last 3 results) No results for input(s): PROBNP in the last 8760 hours. HbA1C: No results for input(s): HGBA1C in the last 72 hours. CBG: No results for input(s): GLUCAP in the last 168 hours. Lipid Profile: No results for input(s): CHOL, HDL, LDLCALC, TRIG, CHOLHDL, LDLDIRECT in the last 72 hours. Thyroid Function Tests: No results for input(s): TSH, T4TOTAL, FREET4, T3FREE, THYROIDAB in the last 72 hours. Anemia Panel: No results for input(s): VITAMINB12, FOLATE, FERRITIN, TIBC, IRON, RETICCTPCT in the last 72 hours. Urine analysis:    Component Value Date/Time   COLORURINE YELLOW 06/12/2016 1102   APPEARANCEUR CLEAR 06/12/2016 1102   LABSPEC  1.019 06/12/2016 1102   PHURINE 7.0 06/12/2016 1102   GLUCOSEU NEGATIVE 06/12/2016 1102   HGBUR NEGATIVE 06/12/2016 1102   HGBUR negative 01/19/2009 1553   BILIRUBINUR NEGATIVE 06/12/2016 1102   BILIRUBINUR Neg 02/03/2013 1621   KETONESUR NEGATIVE 06/12/2016 1102   PROTEINUR NEGATIVE 06/12/2016 1102   UROBILINOGEN 0.2 07/11/2014 0223   NITRITE NEGATIVE 06/12/2016 1102   LEUKOCYTESUR MODERATE (A) 06/12/2016 1102    Radiological Exams on Admission: Ct Abdomen Pelvis W Contrast  Result Date: 07/10/2017 CLINICAL DATA:  83 y/o F; right-sided abdominal pain with nausea and vomiting. EXAM: CT ABDOMEN AND PELVIS WITH CONTRAST TECHNIQUE: Multidetector CT imaging of the abdomen and pelvis was performed using the standard protocol following bolus administration of intravenous contrast. CONTRAST:  138mL ISOVUE-300 IOPAMIDOL (ISOVUE-300) INJECTION 61% COMPARISON:  12/07/2015 CT abdomen and pelvis. FINDINGS: Lower chest: Minor bibasilar atelectasis. Mild coronary artery calcification. Hepatobiliary: No focal liver abnormality is seen. No gallstones, gallbladder wall thickening, or biliary dilatation. Pancreas: Unremarkable. No pancreatic ductal dilatation or surrounding inflammatory changes. Spleen: Normal in size without focal abnormality.  Adrenals/Urinary Tract: Stable nodular hypertrophy of the left adrenal gland. Kidneys are normal, without renal calculi, focal lesion, or hydronephrosis. Bladder is unremarkable. Stomach/Bowel: Small bowel obstruction in a similar configuration to prior CT of abdomen and pelvis, transition point in the right mid abdomen. Mild diverticulosis of sigmoid colon. Normal stomach. No evidence for perforation or abscess. Appendectomy. Vascular/Lymphatic: Aortic atherosclerosis. No enlarged abdominal or pelvic lymph nodes. Reproductive: Status post hysterectomy. No adnexal masses. Other: Postsurgical changes within the lower anterior abdominal wall and small paraumbilical hernia containing fat are stable. Musculoskeletal: Right hip replacement. Stable degenerative changes of the lumbar spine with prominent facet arthrosis greatest at the L4-5 level where there is multifactorial moderate to severe canal stenosis. IMPRESSION: 1. Small bowel obstruction with transition in the right mid abdomen, possibly due to adhesions, similar in configuration to prior CTs of the abdomen and pelvis. No evidence for perforation. 2. Additional stable chronic changes as above. Electronically Signed   By: Kristine Garbe M.D.   On: 07/10/2017 05:31    EKG: Independently reviewed.  Assessment/Plan Principal Problem:   SBO (small bowel obstruction) (HCC) Active Problems:   DMII (diabetes mellitus, type 2) ([place])    1. SBO 1. Patient declining NGT at this time - though I told her I thought she needed it, likely in the next couple of hours given the severity of pain.  Will try it her way without NGT for now though. 2. IVF 3. Zofran 4. Morphine 5. NPO 6. Consider surgical eval in AM 2. DM2 - 1. Hold metformin 2. Sensitive SSI Q4H  DVT prophylaxis: Lovenox Code Status: Full Family Communication: No family in room Disposition Plan: Home after admit Consults called: None Admission status: Admit to  inpatient   Etta Quill DO [facility] Pager [phone]  If 7AM-7PM, please contact day team taking care of patient [email or web address] Password [email or web address]  07/10/2017, 5:57 AM

## 2017-07-10 NOTE — Progress Notes (Signed)
Patient admitted with sob this am, vital stable, basis labs unremarkable.  I have contacted general surgery.

## 2017-07-10 NOTE — ED Notes (Signed)
Surgery PA at bedside.  

## 2017-07-10 NOTE — Consult Note (Signed)
Reason for Consult:SBO CC :  Right sided pain, with nausea and vomiting since 5 PM last evening Referring Physician: Dr. Bennie Dallas  [name] is an 83 y.o. female.  HPI: Pt presented to the ED early this AM with above symptoms.  She has a hx of recurrent SBO and underwent surgery with a Lysis of adhesion 07/07/11 by Dr. Brantley Stage.  She has had recurrent SBO and hospitalized 07/2014 and again on 12/07/15. Both of these resolved without further surgery.   Work up in the ED shows: she is afebrile, VSS, BP up some.  Labs are unremarkable/normal. Glucose is up slightly.  CT with contrast:Small bowel obstruction in a similar configuration to prior CT of abdomen and pelvis, transition point in the right mid abdomen. Mild diverticulosis of sigmoid colon. Normal stomach. No evidence for perforation or abscess. Appendectomy. WE are ask to see, she has declined NG placement at this time.   Past Medical History:  Diagnosis Date  . Abnormal breast exam 2004   MMG neg (eval by surgeon neg)  . Anemia   . Arthritis    "knees" (07/11/2014)  . History of blood transfusion    "related to a surgery, I think"  . Hx SBO 2004 , 2007, 2012    exploratory surgery 9-12   . Iritis 2005  . Osteoarthritis   . Osteopenia   . SBO (small bowel obstruction) ([facility]) 07-2014  . Type II diabetes mellitus ([place])     Past Surgical History:  Procedure Laterality Date  . APPENDECTOMY    . CATARACT EXTRACTION Bilateral   . DILATION AND CURETTAGE OF UTERUS    . EXPLORATORY LAPAROTOMY  07/2011   SBO  . JOINT REPLACEMENT    . TOTAL ABDOMINAL HYSTERECTOMY  1970's   for bleeding, no cancer  . TOTAL HIP ARTHROPLASTY Right 06/23/2016   Procedure: TOTAL HIP ARTHROPLASTY ANTERIOR APPROACH;  Surgeon: Dorna Leitz, MD;  Location: [facility];  Service: Orthopedics;  Laterality: Right;  . TOTAL KNEE ARTHROPLASTY Right 10/2010    Family History  Problem Relation Age of Onset  . Diabetes Father   . Breast cancer Sister   . Sudden death  Sister   . Heart attack Neg Hx   . Colon cancer Neg Hx     Social History:  reports that she has never smoked. She has never used smokeless tobacco. She reports that she does not drink alcohol or use drugs.  Allergies:  Allergies  Allergen Reactions  . Fentanyl Rash    Rash at injection site  . Lipitor [Atorvastatin] Other (See Comments)    tinnitus  . Zocor [Simvastatin] Other (See Comments)    Ear ringing    Prior to Admission medications   Medication Sig Start Date End Date Taking? Authorizing Provider  acetaminophen (TYLENOL) 650 MG CR tablet Take 650-1,300 mg by mouth every 8 (eight) hours as needed for pain.   Yes [provider]  aspirin EC 81 MG tablet Take 81 mg by mouth daily.   Yes [provider]  Calcium-Magnesium-Zinc (CALCIUM-MAGNESUIUM-ZINC PO) Take 1 tablet by mouth daily.   Yes [provider]  cholecalciferol (VITAMIN D) 1000 units tablet Take 2,000 Units by mouth daily.   Yes [provider]  Cyanocobalamin (VITAMIN B12) 1000 MCG TBCR Take 3,000 mcg by mouth daily.   Yes [provider]  metFORMIN (GLUCOPHAGE) 500 MG tablet Take 1 tablet (500 mg total) by mouth 2 (two) times daily with a meal. 12/26/16  Yes Paz,  Alda Berthold, MD  Multiple Vitamins-Minerals (CENTRUM SILVER PO) Take 1 capsule by mouth daily.     Yes [provider]  Omega-3 Fatty Acids (FISH OIL) 1000 MG CAPS Take 2 capsules by mouth daily.   Yes [provider]  pravastatin (PRAVACHOL) 20 MG tablet Take 1 tablet (20 mg total) by mouth daily. 12/09/16  Yes Colon Branch, MD  glucose blood (ONE TOUCH ULTRA TEST) test strip Check blood sugar no more than twice daily 06/30/17   Colon Branch, MD  Lancet Devices (ONE TOUCH DELICA LANCING DEV) MISC To check blood sugars as directed 07/08/17   Colon Branch, MD  Lancets [facility] ULTRASOFT) lancets Check blood sugar no more than twice daily 06/30/17   Colon Branch, MD     Results for orders placed or performed  during the hospital encounter of 07/10/17 (from the past 48 hour(s))  Comprehensive metabolic panel     Status: Abnormal   Collection Time: 07/10/17  1:54 AM  Result Value Ref Range   Sodium 136 135 - 145 mmol/L   Potassium 4.3 3.5 - 5.1 mmol/L   Chloride 100 (L) 101 - 111 mmol/L   CO2 25 22 - 32 mmol/L   Glucose, Bld 164 (H) 65 - 99 mg/dL   BUN 17 6 - 20 mg/dL   Creatinine, Ser 0.81 0.44 - 1.00 mg/dL   Calcium 9.8 8.9 - 10.3 mg/dL   Total Protein 7.6 6.5 - 8.1 g/dL   Albumin 4.1 3.5 - 5.0 g/dL   AST 22 15 - 41 U/L   ALT 17 14 - 54 U/L   Alkaline Phosphatase 55 38 - 126 U/L   Total Bilirubin 0.7 0.3 - 1.2 mg/dL   GFR calc non Af Amer >60 >60 mL/min   GFR calc Af Amer >60 >60 mL/min    Comment: (NOTE) The eGFR has been calculated using the CKD EPI equation. This calculation has not been validated in all clinical situations. eGFR's persistently <60 mL/min signify possible Chronic Kidney Disease.    Anion gap 11 5 - 15  CBC     Status: Abnormal   Collection Time: 07/10/17  1:54 AM  Result Value Ref Range   WBC 9.8 4.0 - 10.5 K/uL   RBC 4.37 3.87 - 5.11 MIL/uL   Hemoglobin 11.7 (L) 12.0 - 15.0 g/dL   HCT 36.4 36.0 - 46.0 %   MCV 83.3 78.0 - 100.0 fL   MCH 26.8 26.0 - 34.0 pg   MCHC 32.1 30.0 - 36.0 g/dL   RDW 13.9 11.5 - 15.5 %   Platelets 251 150 - 400 K/uL  CBG monitoring, ED     Status: Abnormal   Collection Time: 07/10/17  6:54 AM  Result Value Ref Range   Glucose-Capillary 118 (H) 65 - 99 mg/dL  CBG monitoring, ED     Status: None   Collection Time: 07/10/17  8:45 AM  Result Value Ref Range   Glucose-Capillary 93 65 - 99 mg/dL    Ct Abdomen Pelvis W Contrast  Result Date: 07/10/2017 CLINICAL DATA:  83 y/o F; right-sided abdominal pain with nausea and vomiting. EXAM: CT ABDOMEN AND PELVIS WITH CONTRAST TECHNIQUE: Multidetector CT imaging of the abdomen and pelvis was performed using the standard protocol following bolus administration of intravenous contrast.  CONTRAST:  167m ISOVUE-300 IOPAMIDOL (ISOVUE-300) INJECTION 61% COMPARISON:  12/07/2015 CT abdomen and pelvis. FINDINGS: Lower chest: Minor bibasilar atelectasis. Mild coronary artery calcification. Hepatobiliary: No focal liver abnormality is  seen. No gallstones, gallbladder wall thickening, or biliary dilatation. Pancreas: Unremarkable. No pancreatic ductal dilatation or surrounding inflammatory changes. Spleen: Normal in size without focal abnormality. Adrenals/Urinary Tract: Stable nodular hypertrophy of the left adrenal gland. Kidneys are normal, without renal calculi, focal lesion, or hydronephrosis. Bladder is unremarkable. Stomach/Bowel: Small bowel obstruction in a similar configuration to prior CT of abdomen and pelvis, transition point in the right mid abdomen. Mild diverticulosis of sigmoid colon. Normal stomach. No evidence for perforation or abscess. Appendectomy. Vascular/Lymphatic: Aortic atherosclerosis. No enlarged abdominal or pelvic lymph nodes. Reproductive: Status post hysterectomy. No adnexal masses. Other: Postsurgical changes within the lower anterior abdominal wall and small paraumbilical hernia containing fat are stable. Musculoskeletal: Right hip replacement. Stable degenerative changes of the lumbar spine with prominent facet arthrosis greatest at the L4-5 level where there is multifactorial moderate to severe canal stenosis. IMPRESSION: 1. Small bowel obstruction with transition in the right mid abdomen, possibly due to adhesions, similar in configuration to prior CTs of the abdomen and pelvis. No evidence for perforation. 2. Additional stable chronic changes as above. Electronically Signed   By: Kristine Garbe M.D.   On: 07/10/2017 05:31    Review of Systems  Constitutional: Negative.   HENT: Negative.   Eyes: Negative.   Respiratory: Negative.   Cardiovascular: Negative.   Gastrointestinal: Positive for abdominal pain, constipation (chronic constipation), diarrhea  (some with this), nausea and vomiting. Negative for blood in stool, heartburn and melena.  Genitourinary: Negative.   Musculoskeletal: Negative.   Skin: Negative.   Neurological: Positive for dizziness (She cannot bend over or reaching over her head at this time without getting dizzy. She has to have someone else do her housework.). Negative for tingling, tremors, sensory change, speech change, focal weakness, seizures, loss of consciousness and headaches.  Endo/Heme/Allergies: Negative.   Psychiatric/Behavioral: Negative.    Blood pressure (!) 161/87, pulse 73, temperature 98.4 F (36.9 C), resp. rate 17, height _0  (1.651 m), weight 85.3 kg (188 lb 0.8 oz), SpO2 99 %. Physical Exam  Constitutional: She is oriented to person, place, and time. She appears well-developed and well-nourished. No distress.  No acute distress she just feels chronically ill which started yesterday.  HENT:  Head: Atraumatic.  Mouth/Throat: No oropharyngeal exudate.  Eyes: Right eye exhibits no discharge. Left eye exhibits no discharge. No scleral icterus.  Pupils are equal  Neck: Normal range of motion. Neck supple. No JVD present. No tracheal deviation present. No thyromegaly present.  Cardiovascular: Normal rate, regular rhythm, normal heart sounds and intact distal pulses.   No murmur heard. Respiratory: Effort normal and breath sounds normal. No respiratory distress. She has no wheezes. She has no rales. She exhibits no tenderness.  GI: She exhibits distension. She exhibits no mass. There is tenderness. There is no rebound and no guarding.  Midline incision well-healed, hyperactive bowel sounds, mild distention. Diffuse abdominal discomfort but no point tenderness.  Musculoskeletal: She exhibits no edema, tenderness or deformity.  Lymphadenopathy:    She has no cervical adenopathy.  Neurological: She is alert and oriented to person, place, and time. No cranial nerve deficit.  Skin: Skin is warm and dry. No  rash noted. She is not diaphoretic. No erythema. No pallor.  Psychiatric: She has a normal mood and affect. Her behavior is normal. Judgment and thought content normal.    Assessment/Plan: Recurrent small bowel obstruction; s/p appendectomy exploratory laparotomy for SBO prior abdominal hysterectomy Anemia Arthritis       Type 2 diabetes  Plan: I discussed placement of NG tube with patient and she has agreed. The nursing staff in the ED is going to attempt it. If that fails I will centered IR for NG placement. After adequate decompression we can order the small bowel protocol. We will follow with you. Markiah Janeway 07/10/2017, 9:05 AM

## 2017-07-10 NOTE — ED Notes (Signed)
Attempted report 

## 2017-07-11 ENCOUNTER — Inpatient Hospital Stay (HOSPITAL_COMMUNITY): Payer: Medicare Other

## 2017-07-11 LAB — BASIC METABOLIC PANEL
Anion gap: 8 (ref 5–15)
BUN: 12 mg/dL (ref 6–20)
CO2: 24 mmol/L (ref 22–32)
CREATININE: 0.81 mg/dL (ref 0.44–1.00)
Calcium: 8.7 mg/dL — ABNORMAL LOW (ref 8.9–10.3)
Chloride: 110 mmol/L (ref 101–111)
GFR calc Af Amer: >60 mL/min (ref >60)
Glucose, Bld: 106 mg/dL — ABNORMAL HIGH (ref 65–99)
POTASSIUM: 3.9 mmol/L (ref 3.5–5.1)
SODIUM: 142 mmol/L (ref 135–145)

## 2017-07-11 LAB — CBC
HCT: 34.9 % — ABNORMAL LOW (ref 36.0–46.0)
Hemoglobin: 11 g/dL — ABNORMAL LOW (ref 12.0–15.0)
MCH: 26.3 pg (ref 26.0–34.0)
MCHC: 31.5 g/dL (ref 30.0–36.0)
MCV: 83.5 fL (ref 78.0–100.0)
PLATELETS: 244 10*3/uL (ref 150–400)
RBC: 4.18 MIL/uL (ref 3.87–5.11)
RDW: 14 % (ref 11.5–15.5)
WBC: 8.7 10*3/uL (ref 4.0–10.5)

## 2017-07-11 LAB — GLUCOSE, CAPILLARY
GLUCOSE-CAPILLARY: 176 mg/dL — AB (ref 65–99)
GLUCOSE-CAPILLARY: 102 mg/dL — AB (ref 65–99)
Glucose-Capillary: 106 mg/dL — ABNORMAL HIGH (ref 65–99)
Glucose-Capillary: 104 mg/dL — ABNORMAL HIGH (ref 65–99)
Glucose-Capillary: 94 mg/dL (ref 65–99)

## 2017-07-11 LAB — MAGNESIUM: MAGNESIUM: 1.8 mg/dL (ref 1.7–2.4)

## 2017-07-11 NOTE — Progress Notes (Signed)
Subjective/Chief Complaint: Reports minimal abdominal pain now Passed flatus twice today   Objective: Vital signs in last 24 hours: Temp:  [99.3 F (37.4 C)-99.9 F (37.7 C)] 99.9 F (37.7 C) (09/08 0359) Pulse Rate:  [72-81] 81 (09/08 0359) Resp:  [13-20] 16 (09/08 0359) BP: (136-157)/(66-84) 155/81 (09/08 0359) SpO2:  [96 %-100 %] 98 % (09/08 0359) Weight:  [86.5 kg (190 lb 11.2 oz)] 86.5 kg (190 lb 11.2 oz) (09/07 1648) Last BM Date: 07/09/17  Intake/Output from previous day: 09/07 0701 - 09/08 0700 In: 3862.5 [I.V.:3862.5] Out: 200 [Emesis/NG output:200] Intake/Output this shift: Total I/O In: -  Out: 400 [Emesis/NG output:400]  Exam: Abdomen soft, NT, non-distended  Lab Results:   Recent Labs  07/10/17 0154 07/11/17 0353  WBC 9.8 8.7  HGB 11.7* 11.0*  HCT 36.4 34.9*  PLT 251 244   BMET  Recent Labs  07/10/17 0154 07/11/17 0353  NA 136 142  K 4.3 3.9  CL 100* 110  CO2 25 24  GLUCOSE 164* 106*  BUN 17 12  CREATININE 0.81 0.81  CALCIUM 9.8 8.7*   PT/INR No results for input(s): LABPROT, INR in the last 72 hours. ABG No results for input(s): PHART, HCO3 in the last 72 hours.  Invalid input(s): PCO2, PO2  Studies/Results: Dg Abd 1 View  Result Date: 07/10/2017 CLINICAL DATA:  Small bowel obstruction EXAM: ABDOMEN - 1 VIEW COMPARISON:  CT abdomen and pelvis 07/10/2017 FINDINGS: Nasogastric tube in stomach. Retained contrast at gastric fundus. Gas within remainder of stomach as well as within scattered bowel loops in the upper abdomen. Several air-filled dilated loops of small bowel in the LEFT upper quadrant. Diffuse osseous demineralization. Lung bases clear. IMPRESSION: Retained contrast at gastric fundus. Several air-filled dilated small bowel loops in the LEFT upper quadrant. Electronically Signed   By: Lavonia Dana M.D.   On: 07/10/2017 16:56   Ct Abdomen Pelvis W Contrast  Result Date: 07/10/2017 CLINICAL DATA:  83 y/o F; right-sided  abdominal pain with nausea and vomiting. EXAM: CT ABDOMEN AND PELVIS WITH CONTRAST TECHNIQUE: Multidetector CT imaging of the abdomen and pelvis was performed using the standard protocol following bolus administration of intravenous contrast. CONTRAST:  199mL ISOVUE-300 IOPAMIDOL (ISOVUE-300) INJECTION 61% COMPARISON:  12/07/2015 CT abdomen and pelvis. FINDINGS: Lower chest: Minor bibasilar atelectasis. Mild coronary artery calcification. Hepatobiliary: No focal liver abnormality is seen. No gallstones, gallbladder wall thickening, or biliary dilatation. Pancreas: Unremarkable. No pancreatic ductal dilatation or surrounding inflammatory changes. Spleen: Normal in size without focal abnormality. Adrenals/Urinary Tract: Stable nodular hypertrophy of the left adrenal gland. Kidneys are normal, without renal calculi, focal lesion, or hydronephrosis. Bladder is unremarkable. Stomach/Bowel: Small bowel obstruction in a similar configuration to prior CT of abdomen and pelvis, transition point in the right mid abdomen. Mild diverticulosis of sigmoid colon. Normal stomach. No evidence for perforation or abscess. Appendectomy. Vascular/Lymphatic: Aortic atherosclerosis. No enlarged abdominal or pelvic lymph nodes. Reproductive: Status post hysterectomy. No adnexal masses. Other: Postsurgical changes within the lower anterior abdominal wall and small paraumbilical hernia containing fat are stable. Musculoskeletal: Right hip replacement. Stable degenerative changes of the lumbar spine with prominent facet arthrosis greatest at the L4-5 level where there is multifactorial moderate to severe canal stenosis. IMPRESSION: 1. Small bowel obstruction with transition in the right mid abdomen, possibly due to adhesions, similar in configuration to prior CTs of the abdomen and pelvis. No evidence for perforation. 2. Additional stable chronic changes as above. Electronically Signed   By: Mia Creek  [place]urus[place]-Stratton M.D.   On: 07/10/2017  05:31   Dg Abd Portable 1v-small Bowel Obstruction Protocol-initial, 8 Hr Delay  Result Date: 07/11/2017 CLINICAL DATA:  Small bowel obstruction EXAM: PORTABLE ABDOMEN - 1 VIEW COMPARISON:  07/10/2017 ; CT abdomen pelvis - 07/10/2017 FINDINGS: Ingested enteric contrast is seen throughout the colon to the level the rectum. No evidence of enteric obstruction. Enteric tube tip and side port projected the expected location of the gastric fundus. Nondiagnostic evaluation for pneumoperitoneum secondary supine positioning and exclusion of the lower thorax. No pneumatosis or portal venous gas. Several phleboliths overlie the left hemipelvis. Otherwise, no definitive abnormal intra-abdominal calcifications. Post right total hip replacement, incompletely evaluated. No acute osseus abnormalities. IMPRESSION: Enteric contrast seen throughout the colon. No evidence of enteric obstruction. Electronically Signed   By: Sandi Mariscal M.D.   On: 07/11/2017 08:39   Dg Abd Portable 1v-small Bowel Protocol-position Verification  Result Date: 07/10/2017 CLINICAL DATA:  NG tube placement EXAM: PORTABLE ABDOMEN - 1 VIEW COMPARISON:  07/10/2017 FINDINGS: Esophageal tube tip is in the left upper quadrant and projects over the proximal stomach. Mild gaseous enlargement of probable small bowel loops. IMPRESSION: Esophageal tube tip projects over the left upper quadrant/proximal stomach. Continued multiple loops of mildly dilated small bowel. Electronically Signed   By: Donavan Foil M.D.   On: 07/10/2017 19:56    Anti-infectives: Anti-infectives    None      Assessment/Plan:  Resolving partial SBO  Films today show no bowel dilation and contrast in the colon. Per protocol, will d/c NG and start liquids.  LOS: 1 day    Zuriel Roskos A 07/11/2017

## 2017-07-11 NOTE — Progress Notes (Signed)
NGT left in place per pt's request. Clamped and not connected to suction

## 2017-07-11 NOTE — Progress Notes (Signed)
   07/11/17 0900  NG/OG Tube Nasogastric 10 Fr. Left nare Xray  Placement Date/Time: 07/10/17 1625   Person Inserting Catheter: Hampton Abbot RT-R  Tube Type: Nasogastric  Tube Size (Fr.): 10 Fr.  Tube Location: Left nare  Initial Placement Verification: Xray  Site Assessment Clean;Dry;Intact  Ongoing Placement Verification No change in cm markings or external length of tube from initial placement  Status Clamped  NGT d/cd by MD but clamped to assess how pt responds to diet. Will be removed once pt tolerated diet this morning

## 2017-07-11 NOTE — Progress Notes (Signed)
Patient ID: [name], female   DOB: 1930/10/22, 83 y.o.   MRN: [id number]   PROGRESS NOTE    [name]  IPJ:[id number] DOB: 02/22/1930 DOA: 07/10/2017  PCP: Colon Branch, MD  Brief Narrative:  Pt is 83 yo female with known hx of SBO, s/p lysis of adhesions in 2012 by Dr. Brantley Stage, hospitalized for SBO in 2015 and again in 2017, anemia,  Presented with right sided abd pain associated with nausea and non bloody vomiting.  Assessment & Plan:   Principal Problem:   SBO (small bowel obstruction) ([facility]) - pt clinically improving, NGT is still in place and with drainage - pt reports flatus and also had BM this AM - will continue same conservative management for now - appreciate surgery team following  - allow analgesia and antiemetics as needed   Active Problems:   DMII (diabetes mellitus, type 2) ([place]) - since pt still NPO, will keep on SSI for now   DVT prophylaxis: Lovenox SQ Code Status: full code  Family Communication: no family at bedside  Disposition Plan:    Consultants:   Surgery   Procedures:   None  Antimicrobials:   None  Subjective: Pt reports feeling better but still somewhat distended. Flatus +, had BM this AM.   Objective: Vitals:   07/10/17 1500 07/10/17 1648 07/10/17 2020 07/11/17 0359  BP: (!) 145/73 (!) 141/72 136/66 (!) 155/81  Pulse: 72 77 78 81  Resp: 14 15 16 16   Temp:  99.3 F (37.4 C) 99.5 F (37.5 C) 99.9 F (37.7 C)  TempSrc:  Oral Oral Oral  SpO2: 96% 99% 100% 98%  Weight:  86.5 kg (190 lb 11.2 oz)    Height:  5\' 5"  (1.651 m)      Intake/Output Summary (Last 24 hours) at 07/11/17 0817 Last data filed at 07/11/17 0720  Gross per 24 hour  Intake           3862.5 ml  Output              600 ml  Net           3262.5 ml   Filed Weights   07/10/17 0500 07/10/17 1648  Weight: 85.3 kg (188 lb 0.8 oz) 86.5 kg (190 lb 11.2 oz)    Examination:  General exam: Appears calm and comfortable  Respiratory system: Clear to  auscultation. Respiratory effort normal. Cardiovascular system: S1 & S2 heard, RRR. No JVD, murmurs, rubs, gallops or clicks. No pedal edema. Gastrointestinal system: Abdomen is slightly distended, BP noted but still sluggish, NGT in place Central nervous system: Alert and oriented. No focal neurological deficits. Extremities: Symmetric 5 x 5 power. Skin: No rashes, lesions or ulcers Psychiatry: Judgement and insight appear normal. Mood & affect appropriate.   Data Reviewed: I have personally reviewed following labs and imaging studies  CBC:  Recent Labs Lab 07/10/17 0154 07/11/17 0353  WBC 9.8 8.7  HGB 11.7* 11.0*  HCT 36.4 34.9*  MCV 83.3 83.5  PLT 251 734   Basic Metabolic Panel:  Recent Labs Lab 07/10/17 0154 07/11/17 0353  NA 136 142  K 4.3 3.9  CL 100* 110  CO2 25 24  GLUCOSE 164* 106*  BUN 17 12  CREATININE 0.81 0.81  CALCIUM 9.8 8.7*  MG  --  1.8   Liver Function Tests:  Recent Labs Lab 07/10/17 0154  AST 22  ALT 17  ALKPHOS 55  BILITOT 0.7  PROT 7.6  ALBUMIN 4.1  CBG:  Recent Labs Lab 07/10/17 1203 07/10/17 1659 07/10/17 1959 07/10/17 2321 07/11/17 0357  GLUCAP 107* 100* 96 102* 106*   Radiology Studies: Dg Abd 1 View  Result Date: 07/10/2017 CLINICAL DATA:  Small bowel obstruction EXAM: ABDOMEN - 1 VIEW COMPARISON:  CT abdomen and pelvis 07/10/2017 FINDINGS: Nasogastric tube in stomach. Retained contrast at gastric fundus. Gas within remainder of stomach as well as within scattered bowel loops in the upper abdomen. Several air-filled dilated loops of small bowel in the LEFT upper quadrant. Diffuse osseous demineralization. Lung bases clear. IMPRESSION: Retained contrast at gastric fundus. Several air-filled dilated small bowel loops in the LEFT upper quadrant. Electronically Signed   By: Lavonia Dana M.D.   On: 07/10/2017 16:56   Ct Abdomen Pelvis W Contrast  Result Date: 07/10/2017 CLINICAL DATA:  83 y/o F; right-sided abdominal pain with  nausea and vomiting. EXAM: CT ABDOMEN AND PELVIS WITH CONTRAST TECHNIQUE: Multidetector CT imaging of the abdomen and pelvis was performed using the standard protocol following bolus administration of intravenous contrast. CONTRAST:  142mL ISOVUE-300 IOPAMIDOL (ISOVUE-300) INJECTION 61% COMPARISON:  12/07/2015 CT abdomen and pelvis. FINDINGS: Lower chest: Minor bibasilar atelectasis. Mild coronary artery calcification. Hepatobiliary: No focal liver abnormality is seen. No gallstones, gallbladder wall thickening, or biliary dilatation. Pancreas: Unremarkable. No pancreatic ductal dilatation or surrounding inflammatory changes. Spleen: Normal in size without focal abnormality. Adrenals/Urinary Tract: Stable nodular hypertrophy of the left adrenal gland. Kidneys are normal, without renal calculi, focal lesion, or hydronephrosis. Bladder is unremarkable. Stomach/Bowel: Small bowel obstruction in a similar configuration to prior CT of abdomen and pelvis, transition point in the right mid abdomen. Mild diverticulosis of sigmoid colon. Normal stomach. No evidence for perforation or abscess. Appendectomy. Vascular/Lymphatic: Aortic atherosclerosis. No enlarged abdominal or pelvic lymph nodes. Reproductive: Status post hysterectomy. No adnexal masses. Other: Postsurgical changes within the lower anterior abdominal wall and small paraumbilical hernia containing fat are stable. Musculoskeletal: Right hip replacement. Stable degenerative changes of the lumbar spine with prominent facet arthrosis greatest at the L4-5 level where there is multifactorial moderate to severe canal stenosis. IMPRESSION: 1. Small bowel obstruction with transition in the right mid abdomen, possibly due to adhesions, similar in configuration to prior CTs of the abdomen and pelvis. No evidence for perforation. 2. Additional stable chronic changes as above. Electronically Signed   By: Kristine Garbe M.D.   On: 07/10/2017 05:31   Dg Abd  Portable 1v-small Bowel Protocol-position Verification  Result Date: 07/10/2017 CLINICAL DATA:  NG tube placement EXAM: PORTABLE ABDOMEN - 1 VIEW COMPARISON:  07/10/2017 FINDINGS: Esophageal tube tip is in the left upper quadrant and projects over the proximal stomach. Mild gaseous enlargement of probable small bowel loops. IMPRESSION: Esophageal tube tip projects over the left upper quadrant/proximal stomach. Continued multiple loops of mildly dilated small bowel. Electronically Signed   By: Donavan Foil M.D.   On: 07/10/2017 19:56   Scheduled Meds: . enoxaparin (LOVENOX) injection  40 mg Subcutaneous Q24H  . insulin aspart  0-9 Units Subcutaneous Q4H   Continuous Infusions: . sodium chloride 125 mL/hr at 07/11/17 0720     LOS: 1 day    Time spent: 25 minutes  Greater than 50% of the time spent on counseling and coordinating the care.   Leisa Lenz, MD [facility] Pager [phone]  If 7PM-7AM, please contact night-coverage [email or web address] Password [facility] 07/11/2017, 8:17 AM

## 2017-07-12 LAB — GLUCOSE, CAPILLARY
GLUCOSE-CAPILLARY: 107 mg/dL — AB (ref 65–99)
GLUCOSE-CAPILLARY: 107 mg/dL — AB (ref 65–99)
GLUCOSE-CAPILLARY: 117 mg/dL — AB (ref 65–99)
Glucose-Capillary: 114 mg/dL — ABNORMAL HIGH (ref 65–99)
Glucose-Capillary: 155 mg/dL — ABNORMAL HIGH (ref 65–99)
Glucose-Capillary: 158 mg/dL — ABNORMAL HIGH (ref 65–99)

## 2017-07-12 LAB — BASIC METABOLIC PANEL
Anion gap: 8 (ref 5–15)
BUN: <5 mg/dL — ABNORMAL LOW (ref 6–20)
CALCIUM: 8.4 mg/dL — AB (ref 8.9–10.3)
CO2: 24 mmol/L (ref 22–32)
Chloride: 110 mmol/L (ref 101–111)
Creatinine, Ser: 0.62 mg/dL (ref 0.44–1.00)
GFR calc Af Amer: >60 mL/min (ref >60)
GFR calc non Af Amer: >60 mL/min (ref >60)
GLUCOSE: 116 mg/dL — AB (ref 65–99)
POTASSIUM: 3.4 mmol/L — AB (ref 3.5–5.1)
Sodium: 142 mmol/L (ref 135–145)

## 2017-07-12 LAB — CBC
HCT: 33.2 % — ABNORMAL LOW (ref 36.0–46.0)
Hemoglobin: 10.3 g/dL — ABNORMAL LOW (ref 12.0–15.0)
MCH: 26.3 pg (ref 26.0–34.0)
MCHC: 31 g/dL (ref 30.0–36.0)
MCV: 84.7 fL (ref 78.0–100.0)
Platelets: 227 10*3/uL (ref 150–400)
RBC: 3.92 MIL/uL (ref 3.87–5.11)
RDW: 14.2 % (ref 11.5–15.5)
WBC: 8.2 10*3/uL (ref 4.0–10.5)

## 2017-07-12 NOTE — Progress Notes (Signed)
   Subjective/Chief Complaint: Comfortable Had a bm No nausea or pain   Objective: Vital signs in last 24 hours: Temp:  [98.9 F (37.2 C)-99.9 F (37.7 C)] 98.9 F (37.2 C) (09/09 0417) Pulse Rate:  [77-86] 86 (09/09 0417) Resp:  [16] 16 (09/09 0417) BP: (144-159)/(72-73) 159/72 (09/09 0417) SpO2:  [98 %-100 %] 98 % (09/09 0417) Last BM Date: 07/11/17  Intake/Output from previous day: 09/08 0701 - 09/09 0700 In: 1490 [P.O.:240; I.V.:1250] Out: 402 [Urine:1; Emesis/NG output:400; Stool:1] Intake/Output this shift: No intake/output data recorded.  Exam: Looks well Abdomen soft, NT/ND  Lab Results:   Recent Labs  07/11/17 0353 07/12/17 0509  WBC 8.7 8.2  HGB 11.0* 10.3*  HCT 34.9* 33.2*  PLT 244 227   BMET  Recent Labs  07/11/17 0353 07/12/17 0509  NA 142 142  K 3.9 3.4*  CL 110 110  CO2 24 24  GLUCOSE 106* 116*  BUN 12 <5*  CREATININE 0.81 0.62  CALCIUM 8.7* 8.4*   PT/INR No results for input(s): LABPROT, INR in the last 72 hours. ABG No results for input(s): PHART, HCO3 in the last 72 hours.  Invalid input(s): PCO2, PO2  Studies/Results: Dg Abd 1 View  Result Date: 07/10/2017 CLINICAL DATA:  Small bowel obstruction EXAM: ABDOMEN - 1 VIEW COMPARISON:  CT abdomen and pelvis 07/10/2017 FINDINGS: Nasogastric tube in stomach. Retained contrast at gastric fundus. Gas within remainder of stomach as well as within scattered bowel loops in the upper abdomen. Several air-filled dilated loops of small bowel in the LEFT upper quadrant. Diffuse osseous demineralization. Lung bases clear. IMPRESSION: Retained contrast at gastric fundus. Several air-filled dilated small bowel loops in the LEFT upper quadrant. Electronically Signed   By: Lavonia Dana M.D.   On: 07/10/2017 16:56   Dg Abd Portable 1v-small Bowel Obstruction Protocol-initial, 8 Hr Delay  Result Date: 07/11/2017 CLINICAL DATA:  Small bowel obstruction EXAM: PORTABLE ABDOMEN - 1 VIEW COMPARISON:   07/10/2017 ; CT abdomen pelvis - 07/10/2017 FINDINGS: Ingested enteric contrast is seen throughout the colon to the level the rectum. No evidence of enteric obstruction. Enteric tube tip and side port projected the expected location of the gastric fundus. Nondiagnostic evaluation for pneumoperitoneum secondary supine positioning and exclusion of the lower thorax. No pneumatosis or portal venous gas. Several phleboliths overlie the left hemipelvis. Otherwise, no definitive abnormal intra-abdominal calcifications. Post right total hip replacement, incompletely evaluated. No acute osseus abnormalities. IMPRESSION: Enteric contrast seen throughout the colon. No evidence of enteric obstruction. Electronically Signed   By: Sandi Mariscal M.D.   On: 07/11/2017 08:39   Dg Abd Portable 1v-small Bowel Protocol-position Verification  Result Date: 07/10/2017 CLINICAL DATA:  NG tube placement EXAM: PORTABLE ABDOMEN - 1 VIEW COMPARISON:  07/10/2017 FINDINGS: Esophageal tube tip is in the left upper quadrant and projects over the proximal stomach. Mild gaseous enlargement of probable small bowel loops. IMPRESSION: Esophageal tube tip projects over the left upper quadrant/proximal stomach. Continued multiple loops of mildly dilated small bowel. Electronically Signed   By: Donavan Foil M.D.   On: 07/10/2017 19:56    Anti-infectives: Anti-infectives    None      Assessment/Plan: SBO  Appears to have resolved.  Having BM and contrast in colon on xrays.  NG removed.  Diet advanced. Ok from surgery to go home on a soft diet. Will see PRN  LOS: 2 days    [name] A 07/12/2017

## 2017-07-12 NOTE — Progress Notes (Signed)
Patient ID: [name], female   DOB: [birth year], 83 y.o.   MRN: [id number]   PROGRESS NOTE    [name]  FYB:[id number] DOB: 26-Nov-1929 DOA: 07/10/2017  PCP: Colon Branch, MD  Brief Narrative:  Pt is 83 yo female with known hx of SBO, s/p lysis of adhesions in 2012 by Dr. Brantley Stage, hospitalized for SBO in 2015 and again in 2017, anemia,  Presented with right sided abd pain associated with nausea and non bloody vomiting.  Assessment & Plan:   Principal Problem:   SBO (small bowel obstruction) (HCC) - Per surgery advance to soft diet - Will continue soft diet for next 24 hours - Supplemented potassium - If potassium normal tomorrow, can ho home in am  Active Problems:   DMII (diabetes mellitus, type 2) (HCC) - Continue SSI    Hypokalemia - Due to GI losses - Supplemented  - Follow up BMP in am  DVT prophylaxis: Lovenox subQ Code Status: full code  Family Communication: no family at the bedside this am Disposition Plan: home in am    Consultants:   Surgery   Procedures:   None   Antimicrobials:   None   Subjective: No overnight events.  Objective: Vitals:   07/11/17 0359 07/11/17 1535 07/11/17 2032 07/12/17 0417  BP: (!) 155/81 (!) 146/73 (!) 144/72 (!) 159/72  Pulse: 81 77 81 86  Resp: 16 16 16 16   Temp: 99.9 F (37.7 C) 98.9 F (37.2 C) 99.9 F (37.7 C) 98.9 F (37.2 C)  TempSrc: Oral Oral Oral Oral  SpO2: 98% 100% 99% 98%  Weight:      Height:        Intake/Output Summary (Last 24 hours) at 07/12/17 1511 Last data filed at 07/12/17 0700  Gross per 24 hour  Intake              240 ml  Output                1 ml  Net              239 ml   Filed Weights   07/10/17 0500 07/10/17 1648  Weight: 85.3 kg (188 lb 0.8 oz) 86.5 kg (190 lb 11.2 oz)    Physical Exam  Constitutional: Appears well-developed and well-nourished. No distress.  CVS: RRR, S1/S2 +, no murmurs, no gallops, no carotid bruit.  Pulmonary: Effort and breath sounds  normal, no stridor, rhonchi, wheezes, rales.  Abdominal: Soft. BS +,  No tenderness Musculoskeletal: Normal range of motion. No edema and no tenderness.  Lymphadenopathy: No lymphadenopathy noted, cervical, inguinal. Neuro: Alert. Normal reflexes, muscle tone coordination. No cranial nerve deficit. Skin: Skin is warm and dry. Psychiatric: Normal mood and affect. Behavior, judgment, thought content normal.     Data Reviewed: I have personally reviewed following labs and imaging studies  CBC:  Recent Labs Lab 07/10/17 0154 07/11/17 0353 07/12/17 0509  WBC 9.8 8.7 8.2  HGB 11.7* 11.0* 10.3*  HCT 36.4 34.9* 33.2*  MCV 83.3 83.5 84.7  PLT 251 244 527   Basic Metabolic Panel:  Recent Labs Lab 07/10/17 0154 07/11/17 0353 07/12/17 0509  NA 136 142 142  K 4.3 3.9 3.4*  CL 100* 110 110  CO2 25 24 24   GLUCOSE 164* 106* 116*  BUN 17 12 <5*  CREATININE 0.81 0.81 0.62  CALCIUM 9.8 8.7* 8.4*  MG  --  1.8  --    Liver Function Tests:  Recent Labs  Lab 07/10/17 0154  AST 22  ALT 17  ALKPHOS 55  BILITOT 0.7  PROT 7.6  ALBUMIN 4.1   CBG:  Recent Labs Lab 07/11/17 1958 07/11/17 2357 07/12/17 0407 07/12/17 0822 07/12/17 1151  GLUCAP 102* 114* 107* 107* 155*   Radiology Studies: Dg Abd 1 View  Result Date: 07/10/2017 CLINICAL DATA:  Small bowel obstruction EXAM: ABDOMEN - 1 VIEW COMPARISON:  CT abdomen and pelvis 07/10/2017 FINDINGS: Nasogastric tube in stomach. Retained contrast at gastric fundus. Gas within remainder of stomach as well as within scattered bowel loops in the upper abdomen. Several air-filled dilated loops of small bowel in the LEFT upper quadrant. Diffuse osseous demineralization. Lung bases clear. IMPRESSION: Retained contrast at gastric fundus. Several air-filled dilated small bowel loops in the LEFT upper quadrant. Electronically Signed   By: Lavonia Dana M.D.   On: 07/10/2017 16:56   Ct Abdomen Pelvis W Contrast  Result Date: 07/10/2017 CLINICAL  DATA:  83 y/o F; right-sided abdominal pain with nausea and vomiting. EXAM: CT ABDOMEN AND PELVIS WITH CONTRAST TECHNIQUE: Multidetector CT imaging of the abdomen and pelvis was performed using the standard protocol following bolus administration of intravenous contrast. CONTRAST:  142mL ISOVUE-300 IOPAMIDOL (ISOVUE-300) INJECTION 61% COMPARISON:  12/07/2015 CT abdomen and pelvis. FINDINGS: Lower chest: Minor bibasilar atelectasis. Mild coronary artery calcification. Hepatobiliary: No focal liver abnormality is seen. No gallstones, gallbladder wall thickening, or biliary dilatation. Pancreas: Unremarkable. No pancreatic ductal dilatation or surrounding inflammatory changes. Spleen: Normal in size without focal abnormality. Adrenals/Urinary Tract: Stable nodular hypertrophy of the left adrenal gland. Kidneys are normal, without renal calculi, focal lesion, or hydronephrosis. Bladder is unremarkable. Stomach/Bowel: Small bowel obstruction in a similar configuration to prior CT of abdomen and pelvis, transition point in the right mid abdomen. Mild diverticulosis of sigmoid colon. Normal stomach. No evidence for perforation or abscess. Appendectomy. Vascular/Lymphatic: Aortic atherosclerosis. No enlarged abdominal or pelvic lymph nodes. Reproductive: Status post hysterectomy. No adnexal masses. Other: Postsurgical changes within the lower anterior abdominal wall and small paraumbilical hernia containing fat are stable. Musculoskeletal: Right hip replacement. Stable degenerative changes of the lumbar spine with prominent facet arthrosis greatest at the L4-5 level where there is multifactorial moderate to severe canal stenosis. IMPRESSION: 1. Small bowel obstruction with transition in the right mid abdomen, possibly due to adhesions, similar in configuration to prior CTs of the abdomen and pelvis. No evidence for perforation. 2. Additional stable chronic changes as above. Electronically Signed   By: Kristine Garbe M.D.   On: 07/10/2017 05:31   Dg Abd Portable 1v-small Bowel Obstruction Protocol-initial, 8 Hr Delay  Result Date: 07/11/2017 CLINICAL DATA:  Small bowel obstruction EXAM: PORTABLE ABDOMEN - 1 VIEW COMPARISON:  07/10/2017 ; CT abdomen pelvis - 07/10/2017 FINDINGS: Ingested enteric contrast is seen throughout the colon to the level the rectum. No evidence of enteric obstruction. Enteric tube tip and side port projected the expected location of the gastric fundus. Nondiagnostic evaluation for pneumoperitoneum secondary supine positioning and exclusion of the lower thorax. No pneumatosis or portal venous gas. Several phleboliths overlie the left hemipelvis. Otherwise, no definitive abnormal intra-abdominal calcifications. Post right total hip replacement, incompletely evaluated. No acute osseus abnormalities. IMPRESSION: Enteric contrast seen throughout the colon. No evidence of enteric obstruction. Electronically Signed   By: Sandi Mariscal M.D.   On: 07/11/2017 08:39   Dg Abd Portable 1v-small Bowel Protocol-position Verification  Result Date: 07/10/2017 CLINICAL DATA:  NG tube placement EXAM: PORTABLE ABDOMEN - 1 VIEW COMPARISON:  07/10/2017 FINDINGS: Esophageal tube tip is in the left upper quadrant and projects over the proximal stomach. Mild gaseous enlargement of probable small bowel loops. IMPRESSION: Esophageal tube tip projects over the left upper quadrant/proximal stomach. Continued multiple loops of mildly dilated small bowel. Electronically Signed   By: Donavan Foil M.D.   On: 07/10/2017 19:56   Scheduled Meds: . enoxaparin (LOVENOX) injection  40 mg Subcutaneous Q24H  . insulin aspart  0-9 Units Subcutaneous Q4H   Continuous Infusions: . sodium chloride 50 mL/hr at 07/12/17 0847     LOS: 2 days    Time spent: 25 minutes  Greater than 50% of the time spent on counseling and coordinating the care.   Leisa Lenz, MD [facility] Pager [phone]  If  7PM-7AM, please contact night-coverage [email or web address] Password [email or web address] 07/12/2017, 3:11 PM

## 2017-07-13 LAB — BASIC METABOLIC PANEL
ANION GAP: 7 (ref 5–15)
BUN: <5 mg/dL — ABNORMAL LOW (ref 6–20)
CALCIUM: 8.6 mg/dL — AB (ref 8.9–10.3)
CO2: 26 mmol/L (ref 22–32)
Chloride: 108 mmol/L (ref 101–111)
Creatinine, Ser: 0.66 mg/dL (ref 0.44–1.00)
GFR calc Af Amer: >60 mL/min (ref >60)
GLUCOSE: 103 mg/dL — AB (ref 65–99)
Potassium: 3.2 mmol/L — ABNORMAL LOW (ref 3.5–5.1)
Sodium: 141 mmol/L (ref 135–145)

## 2017-07-13 LAB — CBC
HCT: 32.4 % — ABNORMAL LOW (ref 36.0–46.0)
Hemoglobin: 10.2 g/dL — ABNORMAL LOW (ref 12.0–15.0)
MCH: 26.4 pg (ref 26.0–34.0)
MCHC: 31.5 g/dL (ref 30.0–36.0)
MCV: 83.7 fL (ref 78.0–100.0)
PLATELETS: 229 10*3/uL (ref 150–400)
RBC: 3.87 MIL/uL (ref 3.87–5.11)
RDW: 13.9 % (ref 11.5–15.5)
WBC: 8.2 10*3/uL (ref 4.0–10.5)

## 2017-07-13 LAB — GLUCOSE, CAPILLARY
GLUCOSE-CAPILLARY: 111 mg/dL — AB (ref 65–99)
Glucose-Capillary: 102 mg/dL — ABNORMAL HIGH (ref 65–99)
Glucose-Capillary: 93 mg/dL (ref 65–99)
Glucose-Capillary: 94 mg/dL (ref 65–99)

## 2017-07-13 LAB — MAGNESIUM: Magnesium: 1.7 mg/dL (ref 1.7–2.4)

## 2017-07-13 MED ORDER — POTASSIUM CHLORIDE CRYS ER 20 MEQ PO TBCR
40.0000 meq | EXTENDED_RELEASE_TABLET | Freq: Once | ORAL | Status: AC
  Administered 2017-07-13: 40 meq via ORAL
  Filled 2017-07-13: qty 2

## 2017-07-13 MED ORDER — POTASSIUM CHLORIDE ER 10 MEQ PO TBCR: 20.0000 meq | EXTENDED_RELEASE_TABLET | Freq: Every day | ORAL | 0 refills | Status: DC

## 2017-07-13 NOTE — Consult Note (Signed)
           [facility] CM Primary Care Navigator  07/13/2017  [name] 1930-02-25 [id number]   Went to seepatient at the bedsideto identify possible discharge needs but she was already discharged per RN report.  Patient was discharged home today.  Primary care provider's officeis listed as doing transition of care (TOC).  Patient has a discharge instruction to follow-up with primary care provider in a week.   For questions, please contact:  Dannielle Huh, BSN, RN- [facility] Primary Care Navigator  Telephone: [phone] [place]

## 2017-07-13 NOTE — Discharge Summary (Signed)
Discharge Summary  VARINA HULON JAS:[id number] DOB: 03/17/1930  PCP: Colon Branch, MD  Admit date: 07/10/2017 Discharge date: 07/13/2017  Time spent: <28mins  Recommendations for Outpatient Follow-up:  1. F/u with PMD within a week  for hospital discharge follow up, repeat cbc/bmp at follow up 2. F/u with general surgery as needed  Discharge Diagnoses:  Active Hospital Problems   Diagnosis Date Noted  . SBO (small bowel obstruction) ([place]) 12/07/2015  . DMII (diabetes mellitus, type 2) ([place]) 12/01/2007    Resolved Hospital Problems   Diagnosis Date Noted Date Resolved  No resolved problems to display.    Discharge Condition: stable  Diet recommendation: heart healthy/carb modified  Filed Weights   07/10/17 0500 07/10/17 1648  Weight: 85.3 kg (188 lb 0.8 oz) 86.5 kg (190 lb 11.2 oz)    History of present illness:  PCP: Colon Branch, MD  Patient coming from: Home  I have personally briefly reviewed patient's old medical records in [place]  Chief Complaint: Abd pain  HPI: [name] is a 83 y.o. female with medical history significant of prior SBO, DM2 on metformin.  Patient presents to the ED at [facility] with c/o abd pain, N/V.  Symptoms onset 5pm yesterday.  Pain throughout abdomen.  Severe.  Nothing makes better or worse.  Only small BM yesterday.  Extensive history of SBO numerous times in the past, looks like most recent was early 2017 though.   ED Course: SBO confirmed on CT scan.  Patient's pain not controlled despite fentanyl.  No vomiting in ED.  Patient declining NGT at this time.  Hospital Course:  Principal Problem:   SBO (small bowel obstruction) (HCC) Active Problems:   DMII (diabetes mellitus, type 2) (HCC)   Principal Problem:   SBO (small bowel obstruction) ([place]) - improved with conservative management with ng  -ng removed on 9/9 am, General surgery consulted -patient improved, tolerating  diet advancement   Active  Problems:   noninsulin dependent DMII (diabetes mellitus, type 2) ([place]) -  SSI in the hospital -Resume metformin      Hypokalemia - Due to GI losses - Supplemented  - she is discharged on supplement for a few days, pmd to follow up and repeat k at follow up.  DVT prophylaxis while in the hospital: Lovenox subQ Code Status: full code  Family Communication: patient and niece at bedside Disposition Plan: home    Consultants:   Surgery   Procedures:   Ng placement and NG removal  Antimicrobials:   None    Discharge Exam: BP (!) 141/71 (BP Location: Right Arm)   Pulse 67   Temp 98.4 F (36.9 C) (Oral)   Resp 17   Ht 5\' 5"  (1.651 m)   Wt 86.5 kg (190 lb 11.2 oz)   SpO2 99%   BMI 31.73 kg/m   General: NAD, very pleasant, appear younger than stated age Cardiovascular: RRR Respiratory: CTABL Abdomen: soft, NT, + BS  Discharge Instructions You were cared for by a hospitalist during your hospital stay. If you have any questions about your discharge medications or the care you received while you were in the hospital after you are discharged, you can call the unit and asked to speak with the hospitalist on call if the hospitalist that took care of you is not available. Once you are discharged, your primary care physician will handle any further medical issues. Please note that NO REFILLS for any discharge medications will be authorized once  you are discharged, as it is imperative that you return to your primary care physician (or establish a relationship with a primary care physician if you do not have one) for your aftercare needs so that they can reassess your need for medications and monitor your lab values.  Discharge Instructions    Diet - low sodium heart healthy    Complete by:  As directed    Carb modified   Increase activity slowly    Complete by:  As directed      Allergies as of 07/13/2017      Reactions   Fentanyl Rash   Rash at injection site    Lipitor [atorvastatin] Other (See Comments)   tinnitus   Zocor [simvastatin] Other (See Comments)   Ear ringing      Medication List    TAKE these medications   acetaminophen 650 MG CR tablet Commonly known as:  TYLENOL Take 650-1,300 mg by mouth every 8 (eight) hours as needed for pain.   aspirin EC 81 MG tablet Take 81 mg by mouth daily.   CALCIUM-MAGNESUIUM-ZINC PO Take 1 tablet by mouth daily.   CENTRUM SILVER PO Take 1 capsule by mouth daily.   cholecalciferol 1000 units tablet Commonly known as:  VITAMIN D Take 2,000 Units by mouth daily.   Fish Oil 1000 MG Caps Take 2 capsules by mouth daily.   glucose blood test strip Commonly known as:  ONE TOUCH ULTRA TEST Check blood sugar no more than twice daily   metFORMIN 500 MG tablet Commonly known as:  GLUCOPHAGE Take 1 tablet (500 mg total) by mouth 2 (two) times daily with a meal.   ONE TOUCH DELICA LANCING DEV Misc To check blood sugars as directed   onetouch ultrasoft lancets Check blood sugar no more than twice daily   potassium chloride 10 MEQ tablet Commonly known as:  K-DUR Take 2 tablets (20 mEq total) by mouth daily.   pravastatin 20 MG tablet Commonly known as:  PRAVACHOL Take 1 tablet (20 mg total) by mouth daily.   Vitamin B12 1000 MCG Tbcr Take 3,000 mcg by mouth daily.            Discharge Care Instructions        Start     Ordered   07/13/17 0000  potassium chloride (K-DUR) 10 MEQ tablet  Daily     07/13/17 1020   07/13/17 0000  Increase activity slowly     07/13/17 1021   07/13/17 0000  Diet - low sodium heart healthy     07/13/17 1021     Allergies  Allergen Reactions  . Fentanyl Rash    Rash at injection site  . Lipitor [Atorvastatin] Other (See Comments)    tinnitus  . Zocor [Simvastatin] Other (See Comments)    Ear ringing   Follow-up [place], MD Follow up in 1 week(s).   Specialty:  Internal Medicine Why:  hospital discharge follow up, repeat  cbc/bmp at follow up Contact information: [address] [place] [address] [phone]        Erroll Luna, MD Follow up.   Specialty:  General Surgery Why:  as  needed for small bowel obstruction Contact information: [address] [place] [address] [phone]            The results of significant diagnostics from this hospitalization (including imaging, microbiology, ancillary and laboratory) are listed below for reference.  Significant Diagnostic Studies: Dg Abd 1 View  Result Date: 07/10/2017 CLINICAL DATA:  Small bowel obstruction EXAM: ABDOMEN - 1 VIEW COMPARISON:  CT abdomen and pelvis 07/10/2017 FINDINGS: Nasogastric tube in stomach. Retained contrast at gastric fundus. Gas within remainder of stomach as well as within scattered bowel loops in the upper abdomen. Several air-filled dilated loops of small bowel in the LEFT upper quadrant. Diffuse osseous demineralization. Lung bases clear. IMPRESSION: Retained contrast at gastric fundus. Several air-filled dilated small bowel loops in the LEFT upper quadrant. Electronically Signed   By: Lavonia Dana M.D.   On: 07/10/2017 16:56   Ct Abdomen Pelvis W Contrast  Result Date: 07/10/2017 CLINICAL DATA:  83 y/o F; right-sided abdominal pain with nausea and vomiting. EXAM: CT ABDOMEN AND PELVIS WITH CONTRAST TECHNIQUE: Multidetector CT imaging of the abdomen and pelvis was performed using the standard protocol following bolus administration of intravenous contrast. CONTRAST:  135mL ISOVUE-300 IOPAMIDOL (ISOVUE-300) INJECTION 61% COMPARISON:  12/07/2015 CT abdomen and pelvis. FINDINGS: Lower chest: Minor bibasilar atelectasis. Mild coronary artery calcification. Hepatobiliary: No focal liver abnormality is seen. No gallstones, gallbladder wall thickening, or biliary dilatation. Pancreas: Unremarkable. No pancreatic ductal dilatation or surrounding inflammatory changes. Spleen: Normal in size without  focal abnormality. Adrenals/Urinary Tract: Stable nodular hypertrophy of the left adrenal gland. Kidneys are normal, without renal calculi, focal lesion, or hydronephrosis. Bladder is unremarkable. Stomach/Bowel: Small bowel obstruction in a similar configuration to prior CT of abdomen and pelvis, transition point in the right mid abdomen. Mild diverticulosis of sigmoid colon. Normal stomach. No evidence for perforation or abscess. Appendectomy. Vascular/Lymphatic: Aortic atherosclerosis. No enlarged abdominal or pelvic lymph nodes. Reproductive: Status post hysterectomy. No adnexal masses. Other: Postsurgical changes within the lower anterior abdominal wall and small paraumbilical hernia containing fat are stable. Musculoskeletal: Right hip replacement. Stable degenerative changes of the lumbar spine with prominent facet arthrosis greatest at the L4-5 level where there is multifactorial moderate to severe canal stenosis. IMPRESSION: 1. Small bowel obstruction with transition in the right mid abdomen, possibly due to adhesions, similar in configuration to prior CTs of the abdomen and pelvis. No evidence for perforation. 2. Additional stable chronic changes as above. Electronically Signed   By: Kristine Garbe M.D.   On: 07/10/2017 05:31   Dg Abd Portable 1v-small Bowel Obstruction Protocol-initial, 8 Hr Delay  Result Date: 07/11/2017 CLINICAL DATA:  Small bowel obstruction EXAM: PORTABLE ABDOMEN - 1 VIEW COMPARISON:  07/10/2017 ; CT abdomen pelvis - 07/10/2017 FINDINGS: Ingested enteric contrast is seen throughout the colon to the level the rectum. No evidence of enteric obstruction. Enteric tube tip and side port projected the expected location of the gastric fundus. Nondiagnostic evaluation for pneumoperitoneum secondary supine positioning and exclusion of the lower thorax. No pneumatosis or portal venous gas. Several phleboliths overlie the left hemipelvis. Otherwise, no definitive abnormal  intra-abdominal calcifications. Post right total hip replacement, incompletely evaluated. No acute osseus abnormalities. IMPRESSION: Enteric contrast seen throughout the colon. No evidence of enteric obstruction. Electronically Signed   By: Sandi Mariscal M.D.   On: 07/11/2017 08:39   Dg Abd Portable 1v-small Bowel Protocol-position Verification  Result Date: 07/10/2017 CLINICAL DATA:  NG tube placement EXAM: PORTABLE ABDOMEN - 1 VIEW COMPARISON:  07/10/2017 FINDINGS: Esophageal tube tip is in the left upper quadrant and projects over the proximal stomach. Mild gaseous enlargement of probable small bowel loops. IMPRESSION: Esophageal tube tip projects over the left upper quadrant/proximal stomach. Continued multiple loops of mildly dilated small bowel. Electronically Signed  By: Donavan Foil M.D.   On: 07/10/2017 19:56    Microbiology: No results found for this or any previous visit (from the past 240 hour(s)).   Labs: Basic Metabolic Panel:  Recent Labs Lab 07/10/17 0154 07/11/17 0353 07/12/17 0509 07/13/17 0405  NA 136 142 142 141  K 4.3 3.9 3.4* 3.2*  CL 100* 110 110 108  CO2 25 24 24 26   GLUCOSE 164* 106* 116* 103*  BUN 17 12 <5* <5*  CREATININE 0.81 0.81 0.62 0.66  CALCIUM 9.8 8.7* 8.4* 8.6*  MG  --  1.8  --  1.7   Liver Function Tests:  Recent Labs Lab 07/10/17 0154  AST 22  ALT 17  ALKPHOS 55  BILITOT 0.7  PROT 7.6  ALBUMIN 4.1   No results for input(s): LIPASE, AMYLASE in the last 168 hours. No results for input(s): AMMONIA in the last 168 hours. CBC:  Recent Labs Lab 07/10/17 0154 07/11/17 0353 07/12/17 0509 07/13/17 0405  WBC 9.8 8.7 8.2 8.2  HGB 11.7* 11.0* 10.3* 10.2*  HCT 36.4 34.9* 33.2* 32.4*  MCV 83.3 83.5 84.7 83.7  PLT 251 244 227 229   Cardiac Enzymes: No results for input(s): CKTOTAL, CKMB, CKMBINDEX, TROPONINI in the last 168 hours. BNP: BNP (last 3 results) No results for input(s): BNP in the last 8760 hours.  ProBNP (last 3  results) No results for input(s): PROBNP in the last 8760 hours.  CBG:  Recent Labs Lab 07/12/17 2003 07/12/17 2348 07/13/17 0445 07/13/17 0734 07/13/17 1228  GLUCAP 158* 94 111* 102* 93       Signed:  Delorice Bannister MD, PhD  [facility] 07/13/2017, 10:15 PM

## 2017-07-14 ENCOUNTER — Encounter: Payer: Self-pay | Admitting: Internal Medicine

## 2017-07-14 ENCOUNTER — Ambulatory Visit (INDEPENDENT_AMBULATORY_CARE_PROVIDER_SITE_OTHER): Payer: Medicare Other | Admitting: Internal Medicine

## 2017-07-14 VITALS — BP 128/70 | HR 85 | Temp 98.4°F | Resp 14 | Ht 65.0 in | Wt 188.0 lb

## 2017-07-14 DIAGNOSIS — K56609 Unspecified intestinal obstruction, unspecified as to partial versus complete obstruction: Secondary | ICD-10-CM | POA: Diagnosis not present

## 2017-07-14 NOTE — Patient Instructions (Signed)
Make an appointment for blood work next Monday.  Take the medicines as prescribed including 3 days of potassium supplements  Follow-up bland diet and gradually go back to more normal diet.   If you have more problems that suggest an obstructions: Call or go to the ER

## 2017-07-14 NOTE — Progress Notes (Signed)
Subjective:    Patient ID: [name], female    DOB: 1930-01-16, 83 y.o.   MRN: [id number]  DOS:  07/14/2017 Type of visit - description : Hospital follow-up, no TCM Interval history: Admitted to the hospital 07/10/2017 and discharged 3 days later. She had abdominal pain, nausea, vomiting. Surgery was consulted d/t h/o previous SBO, she was managed conservatively, NG tube removed 07/12/2017. She tolerated diet and was discharged home.  labs reviewed, potassium was low at 3.2, kidney function is stable, hemoglobin decreased from baseline, last was 10.2   Review of Systems  since she left the hospital yesterday she is doing slightly better, she still feels weak. Still has some abdominal soreness but no actual pain. Has taken a very light diet last night and today and she tolerated well. She did have a BM today Appetite is slowly coming back. Also, for the last few weeks has noted mild palpitations, they last seconds and they are not associated with chest pain or difficulty breathing.   Past Medical History:  Diagnosis Date  . Abnormal breast exam 2004   MMG neg (eval by surgeon neg)  . Anemia   . Arthritis    "knees" (07/11/2014)  . History of blood transfusion    "related to a surgery, I think"  . Hx SBO 2004 , 2007, 2012    exploratory surgery 9-12   . Iritis 2005  . Osteoarthritis   . Osteopenia   . SBO (small bowel obstruction) ([place]) 07-2014  . Type II diabetes mellitus ([place])     Past Surgical History:  Procedure Laterality Date  . APPENDECTOMY    . CATARACT EXTRACTION Bilateral   . DILATION AND CURETTAGE OF UTERUS    . EXPLORATORY LAPAROTOMY  07/2011   SBO  . JOINT REPLACEMENT    . TOTAL ABDOMINAL HYSTERECTOMY  1970's   for bleeding, no cancer  . TOTAL HIP ARTHROPLASTY Right 06/23/2016   Procedure: TOTAL HIP ARTHROPLASTY ANTERIOR APPROACH;  Surgeon: Dorna Leitz, MD;  Location: [facility];  Service: Orthopedics;  Laterality: Right;  . TOTAL KNEE ARTHROPLASTY Right  10/2010    Social History   Social History  . Marital status: Widowed    Spouse name: N/A  . Number of children: 2  . Years of education: N/A   Occupational History  . retired      Academic librarian    Social History Main Topics  . Smoking status: Never Smoker  . Smokeless tobacco: Never Used  . Alcohol use No  . Drug use: No  . Sexual activity: No   Other Topics Concern  . Not on file   Social History Narrative   Lives by herself, her family is in [place], lost 2 sisters                Allergies as of 07/14/2017      Reactions   Fentanyl Rash   Rash at injection site   Lipitor [atorvastatin] Other (See Comments)   tinnitus   Zocor [simvastatin] Other (See Comments)   Ear ringing      Medication List       Accurate as of 07/14/17 11:59 PM. Always use your most recent med list.          acetaminophen 650 MG CR tablet Commonly known as:  TYLENOL Take 650-1,300 mg by mouth every 8 (eight) hours as needed for pain.   aspirin EC 81 MG tablet Take 81 mg by mouth daily.   [place]  PO Take 1 tablet by mouth daily.   CENTRUM SILVER PO Take 1 capsule by mouth daily.   cholecalciferol 1000 units tablet Commonly known as:  VITAMIN D Take 2,000 Units by mouth daily.   Fish Oil 1000 MG Caps Take 2 capsules by mouth daily.   glucose blood test strip Commonly known as:  ONE TOUCH ULTRA TEST Check blood sugar no more than twice daily   metFORMIN 500 MG tablet Commonly known as:  GLUCOPHAGE Take 1 tablet (500 mg total) by mouth 2 (two) times daily with a meal.   ONE TOUCH DELICA LANCING DEV Misc To check blood sugars as directed   onetouch ultrasoft lancets Check blood sugar no more than twice daily   potassium chloride 10 MEQ tablet Commonly known as:  K-DUR Take 2 tablets (20 mEq total) by mouth daily.   pravastatin 20 MG tablet Commonly known as:  PRAVACHOL Take 1 tablet (20 mg total) by mouth daily.   Vitamin B12 1000 MCG  Tbcr Take 3,000 mcg by mouth daily.            Discharge Care Instructions        Start     Ordered   07/14/17 4496  Basic metabolic panel     75/91/63 1218   07/14/17 0000  CBC w/Diff     07/14/17 1218         Objective:   Physical Exam BP 128/70 (BP Location: Left Arm, Patient Position: Sitting, Cuff Size: Normal)   Pulse 85   Temp 98.4 F (36.9 C) (Oral)   Resp 14   Ht 5\' 5"  (1.651 m)   Wt 188 lb (85.3 kg)   SpO2 98%   BMI 31.28 kg/m  General:   Well developed, well nourished looks a slightly weak/tired but in no distress HEENT:  Normocephalic . Face symmetric, atraumatic Lungs:  CTA B Normal respiratory effort, no intercostal retractions, no accessory muscle use. Heart: RRR,  no murmur.  no pretibial edema bilaterally  Abdomen:  Not distended, soft, minimal diffuse tenderness, no mass or rebound. + Bowel sounds Skin: Not pale. Not jaundice Neurologic:  alert & oriented X3.  Speech normal, gait appropriate for age, assisted by a cane Psych--  Cognition and judgment appear intact.  Cooperative with normal attention span and concentration.  Behavior appropriate. No anxious or depressed appearing.    Assessment & Plan:   Assessment DM  hyperlipidemia Osteopenia-- dexa 05-2015   T score -1.4 Osteoarthritis Iritis Multiple SBO, exploratory surgery 2012  PLAN: Hospital follow-up SBO: Had another episode of SBO that resolved with conservative treatment. During the admission she was noted to be hypokalemic. Was recommended potassium 3 days but she has not started just yet. Plan: Cr[place] diet, potassium supplements as prescribed in the hospital, call or go to the ER if symptoms resurface. Will come back next week for a BMP and CBC. Palpitations: As described above, recommend observation. Next visit for yearly checkup   08-2017

## 2017-07-14 NOTE — Progress Notes (Signed)
Pre visit review using our clinic review tool, if applicable. No additional management support is needed unless otherwise documented below in the visit note. 

## 2017-07-15 NOTE — Assessment & Plan Note (Signed)
Hospital follow-up SBO: Had another episode of SBO that resolved with conservative treatment. During the admission she was noted to be hypokalemic. Was recommended potassium 3 days but she has not started just yet. Plan: [place]iss Rosales diet, potassium supplements as prescribed in the hospital, call or go to the ER if symptoms resurface. Will come back next week for a BMP and CBC. Palpitations: As described above, recommend observation. Next visit for yearly checkup   08-2017

## 2017-07-16 ENCOUNTER — Ambulatory Visit: Payer: Medicare Other | Admitting: Internal Medicine

## 2017-07-18 ENCOUNTER — Other Ambulatory Visit: Payer: Self-pay | Admitting: Internal Medicine

## 2017-07-19 ENCOUNTER — Other Ambulatory Visit: Payer: Self-pay | Admitting: Internal Medicine

## 2017-07-20 ENCOUNTER — Other Ambulatory Visit: Payer: Medicare Other

## 2017-07-21 ENCOUNTER — Other Ambulatory Visit (INDEPENDENT_AMBULATORY_CARE_PROVIDER_SITE_OTHER): Payer: Medicare Other

## 2017-07-21 DIAGNOSIS — K56609 Unspecified intestinal obstruction, unspecified as to partial versus complete obstruction: Secondary | ICD-10-CM

## 2017-07-22 LAB — CBC WITH DIFFERENTIAL/PLATELET
BASOS PCT: 0.9 % (ref 0.0–3.0)
Basophils Absolute: 0.1 10*3/uL (ref 0.0–0.1)
EOS ABS: 0.2 10*3/uL (ref 0.0–0.7)
Eosinophils Relative: 2.1 % (ref 0.0–5.0)
HCT: 37.4 % (ref 36.0–46.0)
HEMOGLOBIN: 11.7 g/dL — AB (ref 12.0–15.0)
LYMPHS ABS: 3.2 10*3/uL (ref 0.7–4.0)
Lymphocytes Relative: 45.5 % (ref 12.0–46.0)
MCHC: 31.3 g/dL (ref 30.0–36.0)
MCV: 86.3 fl (ref 78.0–100.0)
MONO ABS: 0.5 10*3/uL (ref 0.1–1.0)
Monocytes Relative: 6.7 % (ref 3.0–12.0)
NEUTROS PCT: 44.8 % (ref 43.0–77.0)
Neutro Abs: 3.2 10*3/uL (ref 1.4–7.7)
Platelets: 265 10*3/uL (ref 150.0–400.0)
RBC: 4.33 Mil/uL (ref 3.87–5.11)
RDW: 14 % (ref 11.5–15.5)
WBC: 7.1 10*3/uL (ref 4.0–10.5)

## 2017-07-22 LAB — BASIC METABOLIC PANEL
BUN: 16 mg/dL (ref 6–23)
CO2: 31 mEq/L (ref 19–32)
CREATININE: 0.83 mg/dL (ref 0.40–1.20)
Calcium: 10 mg/dL (ref 8.4–10.5)
Chloride: 104 mEq/L (ref 96–112)
GFR: 84.05 mL/min (ref >60.00)
GLUCOSE: 136 mg/dL — AB (ref 70–99)
Potassium: 4.7 mEq/L (ref 3.5–5.1)
Sodium: 142 mEq/L (ref 135–145)

## 2017-08-31 ENCOUNTER — Ambulatory Visit (HOSPITAL_BASED_OUTPATIENT_CLINIC_OR_DEPARTMENT_OTHER)
Admission: RE | Admit: 2017-08-31 | Discharge: 2017-08-31 | Disposition: A | Discharge status: Home / Self Care | Payer: Medicare Other | Source: Ambulatory Visit | Attending: Internal Medicine | Admitting: Internal Medicine

## 2017-08-31 ENCOUNTER — Ambulatory Visit (INDEPENDENT_AMBULATORY_CARE_PROVIDER_SITE_OTHER): Payer: Medicare Other | Admitting: Internal Medicine

## 2017-08-31 ENCOUNTER — Encounter: Payer: Self-pay | Admitting: Internal Medicine

## 2017-08-31 VITALS — BP 126/78 | HR 69 | Temp 97.9°F | Resp 14 | Ht 65.0 in | Wt 185.1 lb

## 2017-08-31 DIAGNOSIS — R0989 Other specified symptoms and signs involving the circulatory and respiratory systems: Secondary | ICD-10-CM | POA: Insufficient documentation

## 2017-08-31 DIAGNOSIS — M159 Polyosteoarthritis, unspecified: Secondary | ICD-10-CM

## 2017-08-31 DIAGNOSIS — I6523 Occlusion and stenosis of bilateral carotid arteries: Secondary | ICD-10-CM | POA: Diagnosis not present

## 2017-08-31 DIAGNOSIS — R519 Headache, unspecified: Secondary | ICD-10-CM

## 2017-08-31 DIAGNOSIS — Z0000 Encounter for general adult medical examination without abnormal findings: Secondary | ICD-10-CM | POA: Diagnosis not present

## 2017-08-31 DIAGNOSIS — I70203 Unspecified atherosclerosis of native arteries of extremities, bilateral legs: Secondary | ICD-10-CM | POA: Diagnosis not present

## 2017-08-31 DIAGNOSIS — E785 Hyperlipidemia, unspecified: Secondary | ICD-10-CM

## 2017-08-31 DIAGNOSIS — Z96641 Presence of right artificial hip joint: Secondary | ICD-10-CM | POA: Insufficient documentation

## 2017-08-31 DIAGNOSIS — R51 Headache: Secondary | ICD-10-CM

## 2017-08-31 DIAGNOSIS — M1612 Unilateral primary osteoarthritis, left hip: Secondary | ICD-10-CM | POA: Diagnosis not present

## 2017-08-31 DIAGNOSIS — Z09 Encounter for follow-up examination after completed treatment for conditions other than malignant neoplasm: Secondary | ICD-10-CM

## 2017-08-31 DIAGNOSIS — M150 Primary generalized (osteo)arthritis: Secondary | ICD-10-CM | POA: Insufficient documentation

## 2017-08-31 DIAGNOSIS — E876 Hypokalemia: Secondary | ICD-10-CM

## 2017-08-31 DIAGNOSIS — E119 Type 2 diabetes mellitus without complications: Secondary | ICD-10-CM | POA: Diagnosis not present

## 2017-08-31 LAB — HIGH SENSITIVITY CRP: CRP, High Sensitivity: 3.65 mg/L (ref 0.000–5.000)

## 2017-08-31 LAB — BASIC METABOLIC PANEL
BUN: 16 mg/dL (ref 6–23)
CHLORIDE: 105 meq/L (ref 96–112)
CO2: 28 mEq/L (ref 19–32)
CREATININE: 0.7 mg/dL (ref 0.40–1.20)
Calcium: 9.6 mg/dL (ref 8.4–10.5)
GFR: 102.28 mL/min (ref >60.00)
Glucose, Bld: 98 mg/dL (ref 70–99)
POTASSIUM: 4.8 meq/L (ref 3.5–5.1)
Sodium: 141 mEq/L (ref 135–145)

## 2017-08-31 LAB — CBC WITH DIFFERENTIAL/PLATELET
BASOS PCT: 0.8 % (ref 0.0–3.0)
Basophils Absolute: 0.1 10*3/uL (ref 0.0–0.1)
EOS ABS: 0.1 10*3/uL (ref 0.0–0.7)
EOS PCT: 1.9 % (ref 0.0–5.0)
HEMATOCRIT: 36.5 % (ref 36.0–46.0)
HEMOGLOBIN: 11.6 g/dL — AB (ref 12.0–15.0)
LYMPHS PCT: 48.4 % — AB (ref 12.0–46.0)
Lymphs Abs: 3.1 10*3/uL (ref 0.7–4.0)
MCHC: 31.7 g/dL (ref 30.0–36.0)
MCV: 86.2 fl (ref 78.0–100.0)
Monocytes Absolute: 0.4 10*3/uL (ref 0.1–1.0)
Monocytes Relative: 6.8 % (ref 3.0–12.0)
NEUTROS ABS: 2.7 10*3/uL (ref 1.4–7.7)
Neutrophils Relative %: 42.1 % — ABNORMAL LOW (ref 43.0–77.0)
PLATELETS: 212 10*3/uL (ref 150.0–400.0)
RBC: 4.23 Mil/uL (ref 3.87–5.11)
RDW: 14.6 % (ref 11.5–15.5)
WBC: 6.5 10*3/uL (ref 4.0–10.5)

## 2017-08-31 LAB — LIPID PANEL
CHOLESTEROL: 157 mg/dL (ref 0–200)
HDL: 51.3 mg/dL (ref >39.00)
LDL CALC: 93 mg/dL (ref 0–99)
NonHDL: 106.12
TRIGLYCERIDES: 68 mg/dL (ref 0.0–149.0)
Total CHOL/HDL Ratio: 3
VLDL: 13.6 mg/dL (ref 0.0–40.0)

## 2017-08-31 LAB — SEDIMENTATION RATE: SED RATE: 25 mm/h (ref 0–30)

## 2017-08-31 LAB — HEMOGLOBIN A1C: Hgb A1c MFr Bld: 6.3 % (ref 4.6–6.5)

## 2017-08-31 NOTE — Assessment & Plan Note (Addendum)
-  Td 09;  Pneumonia shot 10-05 and 2010;  Prevnar: 2016;  shingles shot :2014; flu shot at the next opportunity -CCS:  Cscope 2004 normal   Cscope 03/2014 Diverticulosis, no need to repeat due to age and results  -No further PAPs, see previous entry -Pt desires no more MMG , agree;  many med societies rec to d/c MMG at age 83  -Diet-exercise: Discussed

## 2017-08-31 NOTE — Patient Instructions (Addendum)
GO TO THE LAB : Get the blood work     GO TO THE FRONT DESK Schedule your next appointment for a  Check up in 2 weeks   Will get a CAT scan of your head today  We will schedule a ultrasound of your carotid arteries  If you have severe headache, persistent problems with your vision: Go to the ER

## 2017-08-31 NOTE — Assessment & Plan Note (Addendum)
Headache: New onset of headache  associated with visual disturbances.  DDX is large but includes T.A.  We will get CT head today, sed rate, CRP, CBC.  Further advised with results. Addendum: CT head, sed rate, CRP and CBC normal.  Will refer to neurology .  Spoke with patient, aware of results. Decreased carotid pulse: See physical exam, check a carotid ultrasound Left hip pain: Likely DJD, check x-ray. Tylenol prn.   Addendum: X-ray: Mild DJD, refer to [facility].  Spoke with the patient.  We talked about pain management, will do Tylenol and hydrocodone (has leftover) when needed, usually can't  tolerate more than 1 tablet daily. DM: Diet-controlled, check A1 Recent hypokalemia: Check a BMP, not on supplements  Hyperlipidemia: On Pravachol,Recent LFTs normal, check FLP. RTC 2 weeks, ER if sx severe

## 2017-08-31 NOTE — Progress Notes (Addendum)
Subjective:    Patient ID: [name], female    DOB: 05-02-1930, 83 y.o.   MRN: [id number]  DOS:  08/31/2017 Type of visit - description : cpx Interval history: Here for a CPX, but has a number of acute issues. Reports headaches for the last 6 weeks, located at the left frontal and left parietal areas, on and off, usually at night, symptoms last 45 minutes.  This is something new, she usually does not have headaches.  No associated nausea  Also, for the last 4-5 weeks she has been experiencing double vision.  Usually when she tries to read something, develops double vision, needs to take off her glasses with few seconds and then she is able to see well again. Normal amaurosix fugax per se. No fever chills No generalized myalgias, some weight loss. + Ill-defined pain at the left face when chewing.  No located at the TMJ.  Also, since he left the hospital after a SBO few weeks ago has developed a persistent left buttock pain, thinks is her hip.  Pain is constant, worse with certain positions and definitely worse with walking.  No injury Denies bladder or bowel incontinence. No lower extremity tingling, numbness or paresthesias.  Has chronic tinnitus.  Wt Readings from Last 3 Encounters:  08/31/17 185 lb 2 oz (84 kg)  07/14/17 188 lb (85.3 kg)  07/10/17 190 lb 11.2 oz (86.5 kg)     Review of Systems Denies chest pain or difficulty breathing. No palpitations No lower extremity edema No nausea, vomiting, diarrhea or blood in the stools.  Other than above, a 14 point review of systems is negative     Past Medical History:  Diagnosis Date  . Abnormal breast exam 2004   MMG neg (eval by surgeon neg)  . Anemia   . Arthritis    "knees" (07/11/2014)  . History of blood transfusion    "related to a surgery, I think"  . Hx SBO 2004 , 2007, 2012    exploratory surgery 9-12   . Iritis 2005  . Osteoarthritis   . Osteopenia   . SBO (small bowel obstruction) ([place]) 07-2014   . Type II diabetes mellitus ([place])     Past Surgical History:  Procedure Laterality Date  . APPENDECTOMY    . CATARACT EXTRACTION Bilateral   . DILATION AND CURETTAGE OF UTERUS    . EXPLORATORY LAPAROTOMY  07/2011   SBO  . TOTAL ABDOMINAL HYSTERECTOMY  1970's   for bleeding, no cancer  . TOTAL HIP ARTHROPLASTY Right 06/23/2016   Procedure: TOTAL HIP ARTHROPLASTY ANTERIOR APPROACH;  Surgeon: Dorna Leitz, MD;  Location: [facility];  Service: Orthopedics;  Laterality: Right;  . TOTAL KNEE ARTHROPLASTY Right 10/2010    Social History   Social History  . Marital status: Widowed    Spouse name: N/A  . Number of children: 2  . Years of education: N/A   Occupational History  . retired      Academic librarian    Social History Main Topics  . Smoking status: Never Smoker  . Smokeless tobacco: Never Used  . Alcohol use No  . Drug use: No  . Sexual activity: No   Other Topics Concern  . Not on file   Social History Narrative   Lives by herself, her family is in [place], lost 2 sisters       Lost a son 11-2015   Daughter in [place]         Family History  Problem Relation Age of Onset  . Diabetes Father   . Breast cancer Sister   . Sudden death Sister   . Heart attack Neg Hx   . Colon cancer Neg Hx      Allergies as of 08/31/2017      Reactions   Fentanyl Rash   Rash at injection site   Lipitor [atorvastatin] Other (See Comments)   tinnitus   Zocor [simvastatin] Other (See Comments)   Ear ringing      Medication List       Accurate as of 08/31/17  3:58 PM. Always use your most recent med list.          acetaminophen 650 MG CR tablet Commonly known as:  TYLENOL Take 650-1,300 mg by mouth every 8 (eight) hours as needed for pain.   aspirin EC 81 MG tablet Take 81 mg by mouth daily.   CALCIUM-MAGNESUIUM-ZINC PO Take 1 tablet by mouth daily.   CENTRUM SILVER PO Take 1 capsule by mouth daily.   cholecalciferol 1000 units tablet Commonly known as:  VITAMIN  D Take 2,000 Units by mouth daily.   Fish Oil 1000 MG Caps Take 2 capsules by mouth daily.   glucose blood test strip Commonly known as:  ONE TOUCH ULTRA TEST Check blood sugar no more than twice daily   metFORMIN 500 MG tablet Commonly known as:  GLUCOPHAGE Take 1 tablet (500 mg total) by mouth 2 (two) times daily with a meal.   ONE TOUCH DELICA LANCING DEV Misc To check blood sugars as directed   onetouch ultrasoft lancets Check blood sugar no more than twice daily   pravastatin 20 MG tablet Commonly known as:  PRAVACHOL Take 1 tablet (20 mg total) by mouth daily.   Vitamin B12 1000 MCG Tbcr Take 3,000 mcg by mouth daily.          Objective:   Physical Exam BP 126/78 (BP Location: Left Arm, Patient Position: Sitting, Cuff Size: Small)   Pulse 69   Temp 97.9 F (36.6 C) (Oral)   Resp 14   Ht 5\' 5"  (1.651 m)   Wt 185 lb 2 oz (84 kg)   SpO2 94%   BMI 30.81 kg/m  General:   Well developed, well nourished . NAD.  Neck: No  thyromegaly  Normal left carotid artery.  Decrease right carotid pulse. HEENT:  Normocephalic . Face symmetric, atraumatic.  No TTP on the temples, both temporary arteries are palpable. No TMJ click or pain. EOMI Lungs:  CTA B Normal respiratory effort, no intercostal retractions, no accessory muscle use. Heart: RRR,  no murmur.  No pretibial edema bilaterally  Abdomen:  Not distended, soft, non-tender. No rebound or rigidity.   MSK: No trochanteric bursa TTP, right hip range of motion normal without pain.  Leg elevation triggers left hip discomfort. Hands and wrist: No synovitis on exam Skin: Exposed areas without rash. Not pale. Not jaundice Neurologic:  alert & oriented X3.  Speech normal, gait: Limited by pain, assisted by a cane Strength symmetric and appropriate for age.  Psych: Cognition and judgment appear intact.  Cooperative with normal attention span and concentration.  Behavior appropriate. No anxious or depressed  appearing.     Assessment & Plan:   Assessment DM  hyperlipidemia Osteopenia-- dexa 05-2015   T score -1.4 Osteoarthritis Iritis Multiple SBO, exploratory surgery 2012  PLAN: Headache: New onset of headache  associated with visual disturbances.  DDX is large but includes T.A.  We will  get CT head today, sed rate, CRP, CBC.  Further advised with results. Addendum: CT head, sed rate, CRP and CBC normal.  Will refer to neurology .  Spoke with patient, aware of results. Decreased carotid pulse: See physical exam, check a carotid ultrasound Left hip pain: Likely DJD, check x-ray. Tylenol prn.   Addendum: X-ray: Mild DJD, refer to [facility].  Spoke with the patient.  We talked about pain management, will do Tylenol and hydrocodone (has leftover) when needed, usually can't  tolerate more than 1 tablet daily. DM: Diet-controlled, check A1 Recent hypokalemia: Check a BMP, not on supplements  Hyperlipidemia: On Pravachol,Recent LFTs normal, check FLP. RTC 2 weeks, ER if sx severe

## 2017-08-31 NOTE — Addendum Note (Signed)
Addended byDamita Dunnings D on: 08/31/2017 04:13 PM   Modules accepted: Orders

## 2017-08-31 NOTE — Progress Notes (Signed)
Pre visit review using our clinic review tool, if applicable. No additional management support is needed unless otherwise documented below in the visit note. 

## 2017-09-03 DIAGNOSIS — M25551 Pain in right hip: Secondary | ICD-10-CM | POA: Diagnosis not present

## 2017-09-21 DIAGNOSIS — M5442 Lumbago with sciatica, left side: Secondary | ICD-10-CM | POA: Diagnosis not present

## 2017-09-21 DIAGNOSIS — M545 Low back pain: Secondary | ICD-10-CM | POA: Diagnosis not present

## 2017-09-30 DIAGNOSIS — M545 Low back pain: Secondary | ICD-10-CM | POA: Diagnosis not present

## 2017-10-01 DIAGNOSIS — H43822 Vitreomacular adhesion, left eye: Secondary | ICD-10-CM | POA: Diagnosis not present

## 2017-10-01 DIAGNOSIS — Z961 Presence of intraocular lens: Secondary | ICD-10-CM | POA: Diagnosis not present

## 2017-10-01 DIAGNOSIS — H40013 Open angle with borderline findings, low risk, bilateral: Secondary | ICD-10-CM | POA: Diagnosis not present

## 2017-10-01 DIAGNOSIS — H04123 Dry eye syndrome of bilateral lacrimal glands: Secondary | ICD-10-CM | POA: Diagnosis not present

## 2017-10-01 LAB — HM DIABETES EYE EXAM

## 2017-10-15 DIAGNOSIS — M5442 Lumbago with sciatica, left side: Secondary | ICD-10-CM | POA: Diagnosis not present

## 2017-10-15 DIAGNOSIS — M545 Low back pain: Secondary | ICD-10-CM | POA: Diagnosis not present

## 2017-10-15 DIAGNOSIS — M5441 Lumbago with sciatica, right side: Secondary | ICD-10-CM | POA: Diagnosis not present

## 2017-10-20 DIAGNOSIS — M48061 Spinal stenosis, lumbar region without neurogenic claudication: Secondary | ICD-10-CM | POA: Diagnosis not present

## 2017-11-02 ENCOUNTER — Other Ambulatory Visit: Payer: Self-pay

## 2017-11-02 MED ORDER — PRAVASTATIN SODIUM 20 MG PO TABS: 20.0000 mg | ORAL_TABLET | Freq: Every day | ORAL | 0 refills | Status: DC

## 2017-11-13 ENCOUNTER — Ambulatory Visit: Payer: Self-pay

## 2017-11-13 ENCOUNTER — Emergency Department (HOSPITAL_BASED_OUTPATIENT_CLINIC_OR_DEPARTMENT_OTHER): Payer: Medicare Other

## 2017-11-13 ENCOUNTER — Encounter (HOSPITAL_BASED_OUTPATIENT_CLINIC_OR_DEPARTMENT_OTHER): Payer: Self-pay | Admitting: *Deleted

## 2017-11-13 ENCOUNTER — Emergency Department (HOSPITAL_BASED_OUTPATIENT_CLINIC_OR_DEPARTMENT_OTHER)
Admission: EM | Admit: 2017-11-13 | Discharge: 2017-11-13 | Disposition: A | Discharge status: Home / Self Care | Payer: Medicare Other | Attending: Emergency Medicine | Admitting: Emergency Medicine

## 2017-11-13 ENCOUNTER — Other Ambulatory Visit: Payer: Self-pay

## 2017-11-13 DIAGNOSIS — J3489 Other specified disorders of nose and nasal sinuses: Secondary | ICD-10-CM | POA: Diagnosis not present

## 2017-11-13 DIAGNOSIS — R51 Headache: Secondary | ICD-10-CM | POA: Diagnosis not present

## 2017-11-13 DIAGNOSIS — Z79899 Other long term (current) drug therapy: Secondary | ICD-10-CM | POA: Insufficient documentation

## 2017-11-13 DIAGNOSIS — Z7982 Long term (current) use of aspirin: Secondary | ICD-10-CM | POA: Diagnosis not present

## 2017-11-13 DIAGNOSIS — R0981 Nasal congestion: Secondary | ICD-10-CM | POA: Diagnosis not present

## 2017-11-13 DIAGNOSIS — G501 Atypical facial pain: Secondary | ICD-10-CM | POA: Insufficient documentation

## 2017-11-13 DIAGNOSIS — Z7984 Long term (current) use of oral hypoglycemic drugs: Secondary | ICD-10-CM | POA: Insufficient documentation

## 2017-11-13 DIAGNOSIS — Z96641 Presence of right artificial hip joint: Secondary | ICD-10-CM | POA: Insufficient documentation

## 2017-11-13 DIAGNOSIS — Z96651 Presence of right artificial knee joint: Secondary | ICD-10-CM | POA: Diagnosis not present

## 2017-11-13 DIAGNOSIS — E119 Type 2 diabetes mellitus without complications: Secondary | ICD-10-CM | POA: Diagnosis not present

## 2017-11-13 DIAGNOSIS — R519 Headache, unspecified: Secondary | ICD-10-CM

## 2017-11-13 MED ORDER — TRAMADOL HCL 50 MG PO TABS: 50.0000 mg | ORAL_TABLET | Freq: Four times a day (QID) | ORAL | 0 refills | Status: DC | PRN

## 2017-11-13 MED ORDER — TRAMADOL HCL 50 MG PO TABS
50.0000 mg | ORAL_TABLET | Freq: Once | ORAL | Status: AC
  Administered 2017-11-13: 50 mg via ORAL
  Filled 2017-11-13: qty 1

## 2017-11-13 NOTE — Telephone Encounter (Signed)
Thank you, cannot assess facial tingling over the phone.

## 2017-11-13 NOTE — Discharge Instructions (Signed)
Continue tylenol and tramadol for your pain. Please follow up with your dentist as soon as possible. Return if pain not very well managed at home.

## 2017-11-13 NOTE — ED Triage Notes (Signed)
She had a root canal of her left upper molar 3 weeks ago. She has never completely been pain free since the procedure. She took Ampicillin as directed and went back for recheck 2 days ago. He made an adjustment but the pain continues.

## 2017-11-13 NOTE — Telephone Encounter (Signed)
rec'd phone call from pt's daughter [name].  The pt. gave verbal approval to speak with her daughter, on her behalf.  Daughter reported the pt. is in severe pain in the left side of her face.  Stated the pain starts in the left temple, and goes under the left eye, and down to the nose.  Reported the pt. tried Tylenol today, without any response, and also, took Tramadol about 30 minutes ago, without any relief.  Reported she has had 3 episodes of the facial pain recently.  Rated pain at 10/10; pt. Crying.  Daughter reported redness of the face; no other abnormality reported.  Related hx of having had a root canal about 2 weeks ago, and completed an antibiotic.  Advised the protocol recommended to take pt. To ER.  Questioned if nurse would notify Dr. Larose Kells to ask if he can see her in the office.  Called the [facility]; spoke with [facility]; she will have Dr. Larose Kells' nurse call the pt.  Returned call to pt's. daughter, [name].  Stated they were advised to go to the ER.  Requested the triage nurse to call the ER and make them aware she is coming to their facility.     [facility] in [facility]  Gave report to the ER physician. Verb. Understanding.           Reason for Disposition . [1] SEVERE pain (e.g., excruciating) AND [2] not improved after 2 hours of pain medicine  Answer Assessment - Initial Assessment Questions 1. ONSET: "When did the pain start?" (e.g., minutes, hours, days)     Earlier in the week; went to the dentist 3 weeks ago for root canal; returned yesterday and her teeth were filed down due to incorrect bite 2. ONSET: "Does the pain come and go, or has it been constant since it started?" (e.g., constant, intermittent, fleeting)     Constant 3. SEVERITY: "How bad is the pain?"   (Scale 1-10; mild, moderate or severe)   - MILD (1-3): doesn't interfere with normal activities    - MODERATE (4-7): interferes with normal activities or awakens from sleep    - SEVERE  (8-10): excruciating pain, unable to do any normal activities     Severe; 10/10 4. LOCATION: "Where does it hurt?"      Between left temple region, under left eye and nose 5. RASH: "Is there any redness, rash, or swelling of the face?"     Red appearance to left face 6. FEVER: "Do you have a fever?" If so, ask: "What is it, how was it measured, and when did it start?"     Feels cold 7. OTHER SYMPTOMS: "Do you have any other symptoms?" (e.g., fever, toothache, nasal discharge, nasal congestion, clicking sensation in jaw joint)    Root canal done 2 weeks ago; nasal congestion  8. PREGNANCY: "Is there any chance you are pregnant?" "When was your last menstrual period?"     No  Protocols used: FACE PAIN-A-AH

## 2017-11-13 NOTE — ED Provider Notes (Signed)
[facility] EMERGENCY DEPARTMENT Provider Note   CSN: [id number] Arrival date & time: 11/13/17  1710     History   Chief Complaint Chief Complaint  Patient presents with  . Facial Pain    HPI [name] is a 84 y.o. female.  HPI [name] is a 84 y.o. female presents to ED with complaint of left facial pain. Pt states she has had several episodes over the last week of intermittent severe left facial pain that comes and goes. States tonight pain was very severe, to where according to her family member, pt was "rocking and screaming in pain."  Several weeks ago, patient had left upper dental work, states had a root canal down.  She was treated with a week of antibiotics which she finished.  She states she finished antibiotic about a week and a half ago.  She says that she has been back to a dentist since her symptoms began and he "adjusted my teeth."  She states that they did not help her facial pain.  She reports that she feels like her nose is congested and she tries to blow it but nothing comes out.  She tried Vicks VapoRub in her left nostril.  She tried taking ibuprofen, none of these things have helped.  Tonight she took tramadol and now her pain is subsiding.  She describes pain as both sharp and pressure-like.  She states it is worse when she lays down flat.  She states currently her pain is about 5 out of 10, states it was 12 out of 10 earlier.  Denies history of similar symptoms in the past.  Past Medical History:  Diagnosis Date  . Abnormal breast exam 2004   MMG neg (eval by surgeon neg)  . Anemia   . Arthritis    "knees" (07/11/2014)  . History of blood transfusion    "related to a surgery, I think"  . Hx SBO 2004 , 2007, 2012    exploratory surgery 9-12   . Iritis 2005  . Osteoarthritis   . Osteopenia   . SBO (small bowel obstruction) ([place]) 07-2014  . Type II diabetes mellitus [facility])     Patient Active Problem List   Diagnosis Date Noted  .  Primary osteoarthritis of right hip 06/23/2016  . PCP NOTES >>>>>>>>>>>. 05/02/2016  . SBO (small bowel obstruction) ([place]) 12/07/2015  . Neuropathy 05/23/2015  . Dizziness 04/24/2015  . Facial pain syndrome 10/23/2014  . Pulmonary nodules 07/24/2014  . Annual physical exam 08/27/2011  . Tinnitus 08/26/2010  . SEBORRHEIC KERATOSIS 05/17/2010  . Dyslipidemia 04/09/2009  . Osteoarthritis 12/21/2008  . DMII (diabetes mellitus, type 2) ([place]) 12/01/2007  . GERD 02/11/2007  . Osteopenia 02/11/2007    Past Surgical History:  Procedure Laterality Date  . APPENDECTOMY    . CATARACT EXTRACTION Bilateral   . DILATION AND CURETTAGE OF UTERUS    . EXPLORATORY LAPAROTOMY  07/2011   SBO  . TOTAL ABDOMINAL HYSTERECTOMY  1970's   for bleeding, no cancer  . TOTAL HIP ARTHROPLASTY Right 06/23/2016   Procedure: TOTAL HIP ARTHROPLASTY ANTERIOR APPROACH;  Surgeon: Dorna Leitz, MD;  Location: [facility];  Service: Orthopedics;  Laterality: Right;  . TOTAL KNEE ARTHROPLASTY Right 10/2010    OB History    No data available       Home Medications    Prior to Admission medications   Medication Sig Start Date End Date Taking? Authorizing Provider  acetaminophen (TYLENOL) 650 MG CR tablet  Take 650-1,300 mg by mouth every 8 (eight) hours as needed for pain.    [provider]  aspirin EC 81 MG tablet Take 81 mg by mouth daily.    [provider]  Calcium-Magnesium-Zinc (CALCIUM-MAGNESUIUM-ZINC PO) Take 1 tablet by mouth daily.    [provider]  cholecalciferol (VITAMIN D) 1000 units tablet Take 2,000 Units by mouth daily.    [provider]  Cyanocobalamin (VITAMIN B12) 1000 MCG TBCR Take 3,000 mcg by mouth daily.    [provider]  glucose blood (ONE TOUCH ULTRA TEST) test strip Check blood sugar no more than twice daily 06/30/17   Colon Branch, MD  Lancet Devices (ONE TOUCH DELICA LANCING DEV) MISC To check blood sugars as directed 07/08/17   Colon Branch, MD    Lancets [facility] ULTRASOFT) lancets Check blood sugar no more than twice daily 06/30/17   Colon Branch, MD  metFORMIN (GLUCOPHAGE) 500 MG tablet Take 1 tablet (500 mg total) by mouth 2 (two) times daily with a meal. 07/20/17   Colon Branch, MD  Multiple Vitamins-Minerals (CENTRUM SILVER PO) Take 1 capsule by mouth daily.      [provider]  Omega-3 Fatty Acids (FISH OIL) 1000 MG CAPS Take 2 capsules by mouth daily.    [provider]  pravastatin (PRAVACHOL) 20 MG tablet Take 1 tablet (20 mg total) by mouth daily. 11/02/17   Colon Branch, MD    Family History Family History  Problem Relation Age of Onset  . Diabetes Father   . Breast cancer Sister   . Sudden death Sister   . Heart attack Neg Hx   . Colon cancer Neg Hx     Social History Social History   Tobacco Use  . Smoking status: Never Smoker  . Smokeless tobacco: Never Used  Substance Use Topics  . Alcohol use: No    Alcohol/week: 0.0 oz  . Drug use: No     Allergies   Fentanyl; Lipitor [atorvastatin]; and Zocor [simvastatin]   Review of Systems Review of Systems  Constitutional: Negative for chills and fever.  HENT: Positive for congestion, dental problem, rhinorrhea, sinus pain and tinnitus. Negative for ear pain and facial swelling.   Respiratory: Negative for cough, chest tightness and shortness of breath.   Cardiovascular: Negative for chest pain, palpitations and leg swelling.  Gastrointestinal: Negative for abdominal pain, diarrhea, nausea and vomiting.  Genitourinary: Negative for dysuria and flank pain.  Musculoskeletal: Negative for arthralgias, myalgias, neck pain and neck stiffness.  Skin: Negative for rash.  Neurological: Positive for headaches. Negative for dizziness and weakness.  All other systems reviewed and are negative.    Physical Exam Updated Vital Signs BP 132/82   Pulse 95   Temp 99 F (37.2 C) (Oral)   Resp 16   Ht 5\' 5"  (1.651 m)   Wt 79.4 kg (175 lb)   SpO2  100%   BMI 29.12 kg/m   Physical Exam  Constitutional: She is oriented to person, place, and time. She appears well-developed and well-nourished. No distress.  HENT:  Head: Normocephalic and atraumatic.  Right Ear: External ear normal.  Left Ear: External ear normal.  Nose: Nose normal.  Mouth/Throat: Oropharynx is clear and moist.  Unremarkable dentition dental infection, no gum swelling, no facial swelling.  "Pressure" when palpating sinuses, specifically left maxillary sinus.  No temporal tenderness.  TMs normal bilaterally  Eyes: Conjunctivae and EOM are normal. Pupils are equal, round, and  reactive to light.  Neck: Normal range of motion. Neck supple.  Cardiovascular: Normal rate, regular rhythm and normal heart sounds.  Pulmonary/Chest: Effort normal and breath sounds normal. No respiratory distress. She has no wheezes. She has no rales.  Musculoskeletal: Normal range of motion. She exhibits no edema.  Neurological: She is alert and oriented to person, place, and time. No cranial nerve deficit. Coordination normal.  Skin: Skin is warm and dry.  Psychiatric: She has a normal mood and affect. Her behavior is normal.  Nursing note and vitals reviewed.    ED Treatments / Results  Labs (all labs ordered are listed, but only abnormal results are displayed) Labs Reviewed - No data to display  EKG  EKG Interpretation None       Radiology Ct Maxillofacial Wo Contrast  Result Date: 11/13/2017 CLINICAL DATA:  Left facial pain, left upper molar root canal 3 weeks prior to imaging. EXAM: CT MAXILLOFACIAL WITHOUT CONTRAST TECHNIQUE: Multidetector CT imaging of the maxillofacial structures was performed. Multiplanar CT image reconstructions were also generated. COMPARISON:  Sinus CT from 11/01/2014 FINDINGS: Osseous: Hyperostosis frontalis interna. Cervical spondylosis and degenerative disc disease with foraminal impingement on the left at C3-4 and C4-5, and on the right at C3-4 and  C5-6. There is streak artifact from the patient's dental fillings. Prior bilateral maxillary molar root canals noted. On images 52-54 of series 9 there appears to be a transverse apical fracture of the distal buccal (anterior-lateral) root of the remaining left maxillary molar. No periapical lucency associated with the left molar. There is a small periapical lucency associated with the posterolateral (posterior-buccal) root of the right maxillary molar, images 30-31 of series 8. No alveolar ridge fractures is identified. Orbits: Unremarkable Sinuses: Unremarkable Soft tissues: No appreciable abscess.  Parapharyngeal spaces normal. Limited intracranial: Mild soft tissue prominence of the right-side of the the sella, but not appreciably changed from 11/01/2014. IMPRESSION: 1. Transverse apical crack of the of the distal buccal (anterior-lateral) root of the remaining left maxillary molar. 2. Small periapical lucency associated with one of the roots of the remaining right maxillary molar. 3. No soft tissue abscess is identified. 4. Cervical spondylosis and degenerative disc disease causing impingement at several levels. 5. Mild soft tissue prominence in the right side of the sella, but stable over the last few years. This could be further investigated with pituitary protocol MRI with and without contrast, if clinically warranted. Electronically Signed   By: Van Clines M.D.   On: 11/13/2017 20:28    Procedures Procedures (including critical care time)  Medications Ordered in ED Medications - No data to display   Initial Impression / Assessment and Plan / ED Course  I have reviewed the triage vital signs and the nursing notes.  Pertinent labs & imaging results that were available during my care of the patient were reviewed by me and considered in my medical decision making (see chart for details).    Patient in the emergency department with severe intermittent left facial pain.  Question sinus  disease versus dental disease given recent dental work, versus trigeminal neuralgia.  Patient's pain seemed to improve at this time.  Will get CT maxillofacial to evaluate for sinus disease.  CT scan suspicious for transverse apical fracture of the root of the left maxillary molar.  No signs of infection.  Suspect this could be a source of her pain.  Otherwise sinuses are clear.  Differential still includes trigeminal neuralgia.  Patient has had good relief of pain  with tramadol, and she would like a prescription for the same until she is able to see her doctor on Monday.  I will provide her with prescription.  Will have patient follow-up with family doctor. Return precautions discussed.   Vitals:   11/13/17 1712 11/13/17 1726 11/13/17 1727 11/13/17 2121  BP:   132/82 135/78  Pulse: 98  95 91  Resp:   16 18  Temp:   99 F (37.2 C)   TempSrc:   Oral   SpO2: 98%  100% 100%  Weight:  79.4 kg (175 lb)    Height:  5\' 5"  (1.651 m)       Final Clinical Impressions(s) / ED Diagnoses   Final diagnoses:  Lt facial pain    ED Discharge Orders        Ordered    traMADol (ULTRAM) 50 MG tablet  Every 6 hours PRN     11/13/17 2100       Jeannett Senior, PA-C 11/13/17 2256    Davonna Belling, MD 11/13/17 2341

## 2017-11-13 NOTE — Telephone Encounter (Signed)
Pt refused to go to ED as instructed by [facility] triage center, she asked to speak with me. Called Pt back- she informed me she had a root canal 2 weeks ago and has seen the dentist x2 since then regarding the numbness/tingling in face and pain- they informed her to follow-up with primary. Informed her given symptoms on Friday afternoon at 4:06 PM to go to ED. She still was hesitant in going because of the wait. Informed her that I would call EMS if needed to come get her or a family member if needed- she informed me that her daughter was there to take her and finally agreed to go.

## 2017-11-14 ENCOUNTER — Other Ambulatory Visit: Payer: Self-pay | Admitting: Internal Medicine

## 2017-11-18 ENCOUNTER — Other Ambulatory Visit: Payer: Self-pay

## 2017-11-18 NOTE — Patient Outreach (Signed)
Outreach patient after ED visit.  Patient verified PCP and states that she has transportation issues on occasion.  She is in the process of scheduling a follow up appointment with PCP.  She asked if we could direct her on getting some help around her home. I explained [place] services and the 24 Hour Nurse Advice Line.  She could not make note of the number at this time but would be looking for the information in the mail.  She agreed to complete the engagement tool.  Successful letter, magnet and know before you go mailed on today.

## 2017-11-20 ENCOUNTER — Other Ambulatory Visit: Payer: Self-pay

## 2017-11-20 NOTE — Patient Outreach (Signed)
[facility]) Care Management  11/20/2017  [name] Mar 04, 1930 [id number]   Telephone Screen  Referral Date: 11/20/17 Referral Source: [facility] ED Census  Referral Reason: "recent ED visit on 11/13/17, engagement score of 9" Insurance: [employer] attempt # 1  to patient. Spoke with patient who immediately reported she was not available and on her way to a funeral.     Plan: RN CM will make outreach attempt to patient within three business days.    Enzo Montgomery, RN,BSN,CCM [place] Management Telephonic Care Management Coordinator Direct Phone: [phone] Toll Free: [phone] Fax: [phone]

## 2017-11-23 ENCOUNTER — Other Ambulatory Visit: Payer: Self-pay

## 2017-11-23 NOTE — Patient Outreach (Signed)
[facility]) Care Management  11/23/2017  [name] 1930-03-23 [id number]   Telephone Screen  Referral Date: 11/20/17 Referral Source: [facility] ED Census  Referral Reason: "recent ED visit on 11/13/17, engagement score of 9" Insurance: [facility]   Outreach attempt #2 to patient. 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 Patient wished to go ahead and proceed with call but RN CM advised patient that we wold be discussing some of her personal medical info and offered to call patient back at later time. She agreed and voiced that she was heading out of a town for a few days and requested call on Thursday at the earliest.     Plan: RN CM will make outreach attempt to patient within a week.   Enzo Montgomery, RN,BSN,CCM [place] Management Telephonic Care Management Coordinator Direct Phone: [phone] Toll Free: [phone] Fax: [phone]

## 2017-11-27 ENCOUNTER — Other Ambulatory Visit: Payer: Self-pay

## 2017-11-27 NOTE — Patient Outreach (Signed)
[facility]) Care Management  11/27/2017  [name] May 15, 1930 [id number]     Telephone Screen  Referral Date:11/20/17 Referral [facility] ED Census Referral Reason:"recent ED visit on 11/13/17, engagement score of 9" Insurance:[employer]     Outreach attempt #3 to patient. No answer at present and RN CM unable to leave message.     Plan: RN CM will send unsuccessful outreach letter to patient and close case if no response within 10 business days.   Enzo Montgomery, RN,BSN,CCM [place] Management Telephonic Care Management Coordinator Direct Phone: [phone] Toll Free: [phone] Fax: [phone]

## 2017-11-27 NOTE — Patient Outreach (Signed)
[facility]) Care Management  11/27/2017  [name] [birth year] [id number]   Telephone Screen  Referral Date:11/20/17 Referral [place] ED Census Referral Reason:"recent ED visit on 11/13/17, engagement score of 9" Insurance:[employer]  Incoming call from patient returning RN CM call. Patient voiced that this was nt a good time for her to talk as she was busy doing several things. RN CM has made three attempts to patient. Patient verbalized that she has brochure and info previously sent to her in the mail from [facility]. No need for RN CM to send unsuccessful outreach info.Advised patient to contact [facility] when it is a convenient time for her to talk and for any needs or concerns.     Plan: RN CM will await return call form patient and close case if no response from patient within 10 business days.    Enzo Montgomery, RN,BSN,CCM [place] Management Telephonic Care Management Coordinator Direct Phone: [phone] Toll Free: [phone] Fax: [phone]

## 2017-12-02 ENCOUNTER — Encounter: Payer: Self-pay | Admitting: Neurology

## 2017-12-02 ENCOUNTER — Ambulatory Visit: Payer: Medicare Other | Admitting: Neurology

## 2017-12-02 VITALS — BP 112/66 | HR 88 | Ht 65.0 in | Wt 184.0 lb

## 2017-12-02 DIAGNOSIS — H9192 Unspecified hearing loss, left ear: Secondary | ICD-10-CM

## 2017-12-02 NOTE — Patient Instructions (Signed)
Hearing Loss Hearing loss is a partial or total loss of the ability to hear. This can be temporary or permanent, and it can happen in one or both ears. Hearing loss may be referred to as deafness. Medical care is necessary to treat hearing loss properly and to prevent the condition from getting worse. Your hearing may partially or completely come back, depending on what caused your hearing loss and how severe it is. In some cases, hearing loss is permanent. What are the causes? Common causes of hearing loss include:  Too much wax in the ear canal.  Infection of the ear canal or middle ear.  Fluid in the middle ear.  Injury to the ear or surrounding area.  An object stuck in the ear.  Prolonged exposure to loud sounds, such as music.  Less common causes of hearing loss include:  Tumors in the ear.  Viral or bacterial infections, such as meningitis.  A hole in the eardrum (perforated eardrum).  Problems with the hearing nerve that sends signals between the brain and the ear.  Certain medicines.  What are the signs or symptoms? Symptoms of this condition may include:  Difficulty telling the difference between sounds.  Difficulty following a conversation when there is background noise.  Lack of response to sounds in your environment. This may be most noticeable when you do not respond to startling sounds.  Needing to turn up the volume on the television, radio, etc.  Ringing in the ears.  Dizziness.  Pain in the ears.  How is this diagnosed? This condition is diagnosed based on a physical exam and a hearing test (audiometry). The audiometry test will be performed by a hearing specialist (audiologist). You may also be referred to an ear, nose, and throat (ENT) specialist (otolaryngologist). How is this treated? Treatment for recent onset of hearing loss may include:  Ear wax removal.  Being prescribed medicines to prevent infection (antibiotics).  Being prescribed  medicines to reduce inflammation (corticosteroids).  Follow these instructions at home:  If you were prescribed an antibiotic medicine, take it as told by your health care provider. Do not stop taking the antibiotic even if you start to feel better.  Take over-the-counter and prescription medicines only as told by your health care provider.  Avoid loud noises.  Return to your normal activities as told by your health care provider. Ask your health care provider what activities are safe for you.  Keep all follow-up visits as told by your health care provider. This is important. Contact a health care provider if:  You feel dizzy.  You develop new symptoms.  You vomit or feel nauseous.  You have a fever. Get help right away if:  You develop sudden changes in your vision.  You have severe ear pain.  You have new or increased weakness.  You have a severe headache. This information is not intended to replace advice given to you by your health care provider. Make sure you discuss any questions you have with your health care provider. Document Released: 10/20/2005 Document Revised: 03/27/2016 Document Reviewed: 03/07/2015 Elsevier Interactive Patient Education  2018 [employer]  

## 2017-12-02 NOTE — Progress Notes (Signed)
[facility]    Provider:  Dr Jaynee Eagles Referring Provider: Colon Branch, MD Primary Care Physician:  Colon Branch, MD  CC:  headache  HPI:  [name] is a 84 y.o. female here as a referral from Dr. Larose Kells for headache.  Past medical history of neuropathy, dizziness, facial pain syndrome (this was documented December 2015), tinnitus, dyslipidemia, diabetes, GERD, osteopenia. Her facial pain has gotten better. She has ringing in the left ear. She has not had her hearing tested. It sounds like little bees in a beehive, worse when laying on the left. Like crumpling tin foil. Is continuous worse with laying on it, better when not laying on it. She can ignore it during the day. She has vertigo, if she moves swiftly she is off balance. She has to stop and hold onto something. No ear pain. She says the left facial pain is resolving and was due to dental work and she is more concerned with the ringing in the ears. The facial pain started right after dental procedure. The hearing changes at least 1.5 years. Never went to an ENT. Stable. No headaches, no vision changes, no jaw pain, no fevers, no chills.  No other focal neurologic deficits, associated symptoms, inciting events or modifiable factors.  Reviewed notes, labs and imaging from outside physicians, which showed:   CT head, reviewed images: No acute intracranial abnormality.  Mild age related atrophy.  Patient was seen in the emergency room earlier this month.  She presented with a complaint of left facial pain.  Patient had several episodes with intermittent severe left facial pain, comes and goes.  Pain can be severe.  Patient was rocking and screaming in pain according to a family member.  Several weeks ago patient had left upper dental work and a root canal done.  She was treated with antibiotics.  She has been to the dentist for her facial pain.  Pain is both sharp and pressure-like.  Worse when she lays flat.  Exam was  unremarkable including dentition and dental examination.  In December 2015, reviewed notes, she had left facial pain that had been going on for 6 weeks, was treated for sinusitis with partial temporary improvement, the pain eventually resolved after about a month with over-the-counter medications.  Reviewed CT Maxillofacial report below: 1. Transverse apical crack of the of the distal buccal (anterior-lateral) root of the remaining left maxillary molar. 2. Small periapical lucency associated with one of the roots of the remaining right maxillary molar. 3. No soft tissue abscess is identified. 4. Cervical spondylosis and degenerative disc disease causing impingement at several levels. 5. Mild soft tissue prominence in the right side of the sella, but stable over the last few years. This could be further investigated with pituitary protocol MRI with and without contrast, if clinically warranted.  Review of Systems: Patient complains of symptoms per HPI as well as the following symptoms: hearing changes left ear. Pertinent negatives and positives per HPI. All others negative.   Social History   Socioeconomic History  . Marital status: Widowed    Spouse name: Not on file  . Number of children: 3  . Years of education: Not on file  . Highest education level: Not on file  Social Needs  . Financial resource strain: Not on file  . Food insecurity - worry: Not on file  . Food insecurity - inability: Not on file  . Transportation needs - medical: Not on file  . Transportation needs -  non-medical: Not on file  Occupational History  . Occupation: retired     Comment: makes aprons   Tobacco Use  . Smoking status: Never Smoker  . Smokeless tobacco: Never Used  Substance and Sexual Activity  . Alcohol use: No    Alcohol/week: 0.0 oz  . Drug use: No  . Sexual activity: No  Other Topics Concern  . Not on file  Social History Narrative   Lives by herself, her family is in [place], lost  2 sisters       Lost a son 11-2015   Daughter in [place]   Right handed   No caffeine    Family History  Problem Relation Age of Onset  . Diabetes Father   . Breast cancer Sister   . Brain cancer Sister   . Sudden death Sister   . Renal Disease Son   . Heart attack Neg Hx   . Colon cancer Neg Hx     Past Medical History:  Diagnosis Date  . Abnormal breast exam 2004   MMG neg (eval by surgeon neg)  . Anemia   . Arthritis    "knees" (07/11/2014)  . History of blood transfusion    "related to a surgery, I think"  . Hx SBO 2004 , 2007, 2012    exploratory surgery 9-12   . Iritis 2005  . Osteoarthritis   . Osteopenia   . SBO (small bowel obstruction) ([place]) 07-2014  . Type II diabetes mellitus ([place])     Past Surgical History:  Procedure Laterality Date  . APPENDECTOMY    . CATARACT EXTRACTION Bilateral   . DILATION AND CURETTAGE OF UTERUS    . EXPLORATORY LAPAROTOMY  07/2011   SBO  . TOTAL ABDOMINAL HYSTERECTOMY  1970's   for bleeding, no cancer  . TOTAL HIP ARTHROPLASTY Right 06/23/2016   Procedure: TOTAL HIP ARTHROPLASTY ANTERIOR APPROACH;  Surgeon: Dorna Leitz, MD;  Location: [facility];  Service: Orthopedics;  Laterality: Right;  . TOTAL KNEE ARTHROPLASTY Right 10/2010    Current Outpatient Medications  Medication Sig Dispense Refill  . acetaminophen (TYLENOL) 650 MG CR tablet Take 650-1,300 mg by mouth every 8 (eight) hours as needed for pain.    Marland Kitchen aspirin EC 81 MG tablet Take 81 mg by mouth daily.    . cholecalciferol (VITAMIN D) 1000 units tablet Take 2,000 Units by mouth daily.    Marland Kitchen glucose blood (ONE TOUCH ULTRA TEST) test strip Check blood sugar no more than twice daily 100 each 12  . Lancet Devices (ONE TOUCH DELICA LANCING DEV) MISC To check blood sugars as directed 1 each 0  . Lancets (ONETOUCH ULTRASOFT) lancets Check blood sugar no more than twice daily 100 each 12  . metFORMIN (GLUCOPHAGE) 500 MG tablet Take 1 tablet (500 mg total) by mouth 2 (two) times  daily with a meal. 60 tablet 5  . Multiple Vitamins-Minerals (CENTRUM SILVER PO) Take 1 capsule by mouth daily.      . Omega-3 Fatty Acids (FISH OIL) 1000 MG CAPS Take 2 capsules by mouth daily.    . pravastatin (PRAVACHOL) 20 MG tablet Take 1 tablet (20 mg total) by mouth daily. 90 tablet 0  . traMADol (ULTRAM) 50 MG tablet Take 1 tablet (50 mg total) by mouth every 6 (six) hours as needed. (Patient not taking: Reported on 12/02/2017) 20 tablet 0   No current facility-administered medications for this visit.     Allergies as of 12/02/2017 - Review Complete 12/02/2017  Allergen Reaction Noted  . Fentanyl Rash 07/10/2017  . Lipitor [atorvastatin] Other (See Comments) 02/01/2013  . Zocor [simvastatin] Other (See Comments) 05/14/2012    Vitals: BP 112/66 (BP Location: Right Arm, Patient Position: Sitting)   Pulse 88   Ht 5\' 5"  (1.651 m)   Wt 184 lb (83.5 kg)   BMI 30.62 kg/m  Last Weight:  Wt Readings from Last 1 Encounters:  12/02/17 184 lb (83.5 kg)   Last Height:   Ht Readings from Last 1 Encounters:  12/02/17 5\' 5"  (1.651 m)    Physical exam: Exam: Gen: NAD, conversant, well nourised, well groomed                     CV: RRR, no MRG. No Carotid Bruits. No peripheral edema, warm, nontender Eyes: Conjunctivae clear without exudates or hemorrhage  Neuro: Detailed Neurologic Exam  Speech:    Speech is normal; fluent and spontaneous with normal comprehension.  Cognition:    The patient is oriented to person, place, and time;     recent and remote memory intact;     language fluent;     normal attention, concentration,     fund of knowledge Cranial Nerves:    The pupils are equal, round, and reactive to light. Attempted fundoscopic exam could not visualize.. Visual fields are full to finger confrontation. Extraocular movements are intact. Trigeminal sensation is intact and the muscles of mastication are normal. The face is symmetric. The palate elevates in the midline.  Hearing intact. Voice is normal. Shoulder shrug is normal. The tongue has normal motion without fasciculations.   Coordination:    No dysmetria   Gait:    normal  Motor Observation:    No asymmetry, no atrophy, and no involuntary movements noted. Tone:    Normal muscle tone.    Posture:    Posture is normal. normal erect    Strength:    Strength is symmetical in the upper and lower limbs.      Sensation: intact to LT     Reflex Exam:  DTR's:    Deep tendon reflexes in the upper and lower extremities are symmetrical bilaterally.   Toes:    The toes are equiv bilaterally.   Clonus:    Clonus is absent.      Assessment/Plan:  Very lovely 84 year old here for decreased hearing left ear, sounds like crinkling tin foil, stable. Likely 8th cranial berve damage due to loud noise or medication, neuro exam non focal. Suggested an MRi brain with thin cuts to ensure no lesions esp vestibular schwannoma and she declines at this time. Will refer to ENT for evaluation, Dr. Ernesto Rutherford. The pain on hte left side of her face has resolved and was due to dental work per patient.  Sarina Ill, MD  [facility] [address] [place], [place] [address]  Phone [phone] Fax [phone]

## 2017-12-11 ENCOUNTER — Other Ambulatory Visit: Payer: Self-pay

## 2017-12-11 NOTE — Patient Outreach (Signed)
[facility]) Care Management  12/11/2017  CHANCEY CULLINANE 21-May-1930 [id number]   Telephone Screen  Referral Date:11/20/17 Referral Source:[employer] ED Census Referral Reason:"recent ED visit on 11/13/17, engagement score of 9" Insurance:[employer]   Multiple attempts to establish contact with patient without success. No response from letter mailed to patient. Case is being closed at this time.    Plan: RN CM will notify [facility] administrative assistant of case status.   Enzo Montgomery, RN,BSN,CCM [place] Management Telephonic Care Management Coordinator Direct Phone: [phone] Toll Free: [phone] Fax: [phone]

## 2017-12-17 DIAGNOSIS — H903 Sensorineural hearing loss, bilateral: Secondary | ICD-10-CM | POA: Diagnosis not present

## 2017-12-17 DIAGNOSIS — H9312 Tinnitus, left ear: Secondary | ICD-10-CM | POA: Diagnosis not present

## 2018-03-16 ENCOUNTER — Encounter: Payer: Self-pay | Admitting: Internal Medicine

## 2018-03-16 ENCOUNTER — Ambulatory Visit (INDEPENDENT_AMBULATORY_CARE_PROVIDER_SITE_OTHER): Payer: Medicare Other | Admitting: Internal Medicine

## 2018-03-16 VITALS — BP 132/68 | HR 63 | Temp 98.3°F | Resp 16 | Ht 65.0 in | Wt 189.2 lb

## 2018-03-16 DIAGNOSIS — M25559 Pain in unspecified hip: Secondary | ICD-10-CM

## 2018-03-16 DIAGNOSIS — E1159 Type 2 diabetes mellitus with other circulatory complications: Secondary | ICD-10-CM

## 2018-03-16 DIAGNOSIS — R002 Palpitations: Secondary | ICD-10-CM | POA: Diagnosis not present

## 2018-03-16 LAB — CBC WITH DIFFERENTIAL/PLATELET
BASOS ABS: 0 10*3/uL (ref 0.0–0.1)
Basophils Relative: 0.6 % (ref 0.0–3.0)
Eosinophils Absolute: 0.2 10*3/uL (ref 0.0–0.7)
Eosinophils Relative: 2.4 % (ref 0.0–5.0)
HEMATOCRIT: 36.4 % (ref 36.0–46.0)
HEMOGLOBIN: 11.6 g/dL — AB (ref 12.0–15.0)
LYMPHS PCT: 46.1 % — AB (ref 12.0–46.0)
Lymphs Abs: 3 10*3/uL (ref 0.7–4.0)
MCHC: 32 g/dL (ref 30.0–36.0)
MCV: 85 fl (ref 78.0–100.0)
MONO ABS: 0.5 10*3/uL (ref 0.1–1.0)
Monocytes Relative: 7 % (ref 3.0–12.0)
Neutro Abs: 2.9 10*3/uL (ref 1.4–7.7)
Neutrophils Relative %: 43.9 % (ref 43.0–77.0)
Platelets: 225 10*3/uL (ref 150.0–400.0)
RBC: 4.29 Mil/uL (ref 3.87–5.11)
RDW: 14 % (ref 11.5–15.5)
WBC: 6.6 10*3/uL (ref 4.0–10.5)

## 2018-03-16 LAB — BASIC METABOLIC PANEL
BUN: 16 mg/dL (ref 6–23)
CO2: 29 mEq/L (ref 19–32)
Calcium: 9.6 mg/dL (ref 8.4–10.5)
Chloride: 104 mEq/L (ref 96–112)
Creatinine, Ser: 0.81 mg/dL (ref 0.40–1.20)
GFR: 86.31 mL/min (ref >60.00)
Glucose, Bld: 98 mg/dL (ref 70–99)
Potassium: 4.8 mEq/L (ref 3.5–5.1)
Sodium: 142 mEq/L (ref 135–145)

## 2018-03-16 LAB — AST: AST: 13 U/L (ref 0–37)

## 2018-03-16 LAB — ALT: ALT: 9 U/L (ref 0–35)

## 2018-03-16 LAB — HEMOGLOBIN A1C: HEMOGLOBIN A1C: 6.4 % (ref 4.6–6.5)

## 2018-03-16 LAB — TSH: TSH: 2.07 u[IU]/mL (ref 0.35–4.50)

## 2018-03-16 NOTE — Patient Instructions (Signed)
GO TO THE LAB : Get the blood work     GO TO THE FRONT DESK Schedule your next appointment for a checkup in 3 months.  If you have severe palpitations or they are different or associated with chest pain or difficulty breathing: Go to the ER

## 2018-03-16 NOTE — Progress Notes (Signed)
Subjective:    Patient ID: [name], female    DOB: [birth year], 84 y.o.   MRN: [id number]  DOS:  03/16/2018 Type of visit - description : acute Interval history: Chief complaint of pain at the left buttock for about a year, reports that she saw a orthopedic doctor, was referred to a local injection but she decided not to proceed. The pain used to be on and off but has been more steady over the last 3 weeks. No radiation, no claudication, no back pain per se. Currently taking Tylenol several times a day with no major relief.  Not taking Ultram  Also, palpitations, for about a year, episodes last few seconds to 1 minute. No associated nausea, diaphoresis, chest pain, difficulty breathing.  She does feels slightly "warm in the face". Symptoms have been at rest or with mild activity.   Review of Systems Recently had tinnitus and decreased hearing, notes from neurology and ENT reviewed  Past Medical History:  Diagnosis Date  . Abnormal breast exam 2004   MMG neg (eval by surgeon neg)  . Anemia   . Arthritis    "knees" (07/11/2014)  . History of blood transfusion    "related to a surgery, I think"  . Hx SBO 2004 , 2007, 2012    exploratory surgery 9-12   . Iritis 2005  . Osteoarthritis   . Osteopenia   . SBO (small bowel obstruction) ([place]) 07-2014  . Type II diabetes mellitus ([place])     Past Surgical History:  Procedure Laterality Date  . APPENDECTOMY    . CATARACT EXTRACTION Bilateral   . DILATION AND CURETTAGE OF UTERUS    . EXPLORATORY LAPAROTOMY  07/2011   SBO  . TOTAL ABDOMINAL HYSTERECTOMY  1970's   for bleeding, no cancer  . TOTAL HIP ARTHROPLASTY Right 06/23/2016   Procedure: TOTAL HIP ARTHROPLASTY ANTERIOR APPROACH;  Surgeon: Dorna Leitz, MD;  Location: [facility];  Service: Orthopedics;  Laterality: Right;  . TOTAL KNEE ARTHROPLASTY Right 10/2010    Social History   Socioeconomic History  . Marital status: Widowed    Spouse name: Not on file  . Number of  children: 3  . Years of education: Not on file  . Highest education level: Not on file  Occupational History  . Occupation: retired     Comment: makes aprons   Social Needs  . Financial resource strain: Not on file  . Food insecurity:    Worry: Not on file    Inability: Not on file  . Transportation needs:    Medical: Not on file    Non-medical: Not on file  Tobacco Use  . Smoking status: Never Smoker  . Smokeless tobacco: Never Used  Substance and Sexual Activity  . Alcohol use: No    Alcohol/week: 0.0 oz  . Drug use: No  . Sexual activity: Never  Lifestyle  . Physical activity:    Days per week: Not on file    Minutes per session: Not on file  . Stress: Not on file  Relationships  . Social connections:    Talks on phone: Not on file    Gets together: Not on file    Attends religious service: Not on file    Active member of club or organization: Not on file    Attends meetings of clubs or organizations: Not on file    Relationship status: Not on file  . Intimate partner violence:    Fear of current or ex  partner: Not on file    Emotionally abused: Not on file    Physically abused: Not on file    Forced sexual activity: Not on file  Other Topics Concern  . Not on file  Social History Narrative   Lives by herself, her family is in [place], lost 2 sisters       Lost a son 11-2015   Daughter in [place]   Right handed   No caffeine      Allergies as of 03/16/2018      Reactions   Fentanyl Rash   Rash at injection site   Lipitor [atorvastatin] Other (See Comments)   tinnitus   Zocor [simvastatin] Other (See Comments)   Ear ringing      Medication List        Accurate as of 03/16/18 11:59 PM. Always use your most recent med list.          acetaminophen 650 MG CR tablet Commonly known as:  TYLENOL Take 650-1,300 mg by mouth every 8 (eight) hours as needed for pain.   aspirin EC 81 MG tablet Take 81 mg by mouth daily.   CENTRUM SILVER PO Take 1  capsule by mouth daily.   cholecalciferol 1000 units tablet Commonly known as:  VITAMIN D Take 2,000 Units by mouth daily.   Fish Oil 1000 MG Caps Take 2 capsules by mouth daily.   glucose blood test strip Commonly known as:  ONE TOUCH ULTRA TEST Check blood sugar no more than twice daily   metFORMIN 500 MG tablet Commonly known as:  GLUCOPHAGE Take 1 tablet (500 mg total) by mouth 2 (two) times daily with a meal.   ONE TOUCH DELICA LANCING DEV Misc To check blood sugars as directed   onetouch ultrasoft lancets Check blood sugar no more than twice daily   pravastatin 20 MG tablet Commonly known as:  PRAVACHOL Take 1 tablet (20 mg total) by mouth daily.          Objective:   Physical Exam BP 132/68 (BP Location: Left Arm, Patient Position: Sitting, Cuff Size: Small)   Pulse 63   Temp 98.3 F (36.8 C) (Oral)   Resp 16   Ht 5\' 5"  (1.651 m)   Wt 189 lb 4 oz (85.8 kg)   SpO2 97%   BMI 31.49 kg/m   General:   Well developed, well nourished . NAD.  HEENT:  Normocephalic . Face symmetric, atraumatic Neck: No thyromegaly Lungs:  CTA B Normal respiratory effort, no intercostal retractions, no accessory muscle use. Heart: RRR,  no murmur.  No pretibial edema bilaterally  Skin: Not pale. Not jaundice  MSK: No TTP at the trochanteric bursas Rotation of the hips triggered pain at the left side. Neurologic:  alert & oriented X3.  Speech normal, gait appropriate for age and unassisted. DTRs symmetric Psych--  Cognition and judgment appear intact.  Cooperative with normal attention span and concentration.  Behavior appropriate. No anxious or depressed appearing.      Assessment & Plan:   Assessment DM  hyperlipidemia Osteopenia-- dexa 05-2015   T score -1.4 Osteoarthritis Iritis Multiple SBO, exploratory surgery 2012 Tinnitus chronic, c/o HOH: saw neuro 11/2017, MRI was done, no report, then she saw ENT (Rx conservative treatment)  PLAN: Palpitations: As  described above, no red flags. Chart reviewed, normal stress test 12-2015.  Normal carotid ultrasound 08-2017. EKG: NSR, poor R wave progression, unchanged from previous. Will check CBC, TSH, echo.  Otherwise observation. Hip pain: Suspect  DJD, only on Tylenol (checking LFTs), does not like Ultram.  Recommend to see orthopedic surgery DM: Currently on metformin, check a A1c, BMP, ast alt Tinnitus, HOH: Chart reviewed Decreased carotid pulse: See last visit, [place] was essentially wnl RTC 3 months

## 2018-03-16 NOTE — Progress Notes (Signed)
Pre visit review using our clinic review tool, if applicable. No additional management support is needed unless otherwise documented below in the visit note. 

## 2018-03-17 NOTE — Assessment & Plan Note (Signed)
Palpitations: As described above, no red flags. Chart reviewed, normal stress test 12-2015.  Normal carotid ultrasound 08-2017. EKG: NSR, poor R wave progression, unchanged from previous. Will check CBC, TSH, echo.  Otherwise observation. Hip pain: Suspect DJD, only on Tylenol (checking LFTs), does not like Ultram.  Recommend to see orthopedic surgery DM: Currently on metformin, check a A1c, BMP, ast alt Tinnitus, HOH: Chart reviewed Decreased carotid pulse: See last visit, [place] was essentially wnl RTC 3 months

## 2018-03-19 ENCOUNTER — Ambulatory Visit (HOSPITAL_COMMUNITY): Payer: Medicare Other | Attending: Cardiology

## 2018-03-19 ENCOUNTER — Other Ambulatory Visit: Payer: Self-pay

## 2018-03-19 DIAGNOSIS — E119 Type 2 diabetes mellitus without complications: Secondary | ICD-10-CM | POA: Insufficient documentation

## 2018-03-19 DIAGNOSIS — R002 Palpitations: Secondary | ICD-10-CM | POA: Diagnosis not present

## 2018-03-19 DIAGNOSIS — R931 Abnormal findings on diagnostic imaging of heart and coronary circulation: Secondary | ICD-10-CM

## 2018-03-19 DIAGNOSIS — I071 Rheumatic tricuspid insufficiency: Secondary | ICD-10-CM | POA: Insufficient documentation

## 2018-03-19 DIAGNOSIS — M48061 Spinal stenosis, lumbar region without neurogenic claudication: Secondary | ICD-10-CM | POA: Diagnosis not present

## 2018-03-25 DIAGNOSIS — M48061 Spinal stenosis, lumbar region without neurogenic claudication: Secondary | ICD-10-CM | POA: Diagnosis not present

## 2018-04-19 DIAGNOSIS — M48061 Spinal stenosis, lumbar region without neurogenic claudication: Secondary | ICD-10-CM | POA: Diagnosis not present

## 2018-05-13 ENCOUNTER — Other Ambulatory Visit: Payer: Self-pay | Admitting: Internal Medicine

## 2018-05-28 ENCOUNTER — Encounter: Payer: Self-pay | Admitting: Internal Medicine

## 2018-06-03 ENCOUNTER — Other Ambulatory Visit: Payer: Self-pay | Admitting: Internal Medicine

## 2018-06-25 ENCOUNTER — Ambulatory Visit: Payer: Medicare Other | Admitting: Internal Medicine

## 2018-06-30 ENCOUNTER — Ambulatory Visit (INDEPENDENT_AMBULATORY_CARE_PROVIDER_SITE_OTHER): Payer: Medicare Other | Admitting: Internal Medicine

## 2018-06-30 ENCOUNTER — Encounter: Payer: Self-pay | Admitting: Internal Medicine

## 2018-06-30 VITALS — BP 112/70 | HR 85 | Temp 98.6°F | Resp 16 | Ht 65.0 in | Wt 194.0 lb

## 2018-06-30 DIAGNOSIS — H9319 Tinnitus, unspecified ear: Secondary | ICD-10-CM

## 2018-06-30 DIAGNOSIS — E785 Hyperlipidemia, unspecified: Secondary | ICD-10-CM | POA: Diagnosis not present

## 2018-06-30 DIAGNOSIS — E119 Type 2 diabetes mellitus without complications: Secondary | ICD-10-CM | POA: Diagnosis not present

## 2018-06-30 DIAGNOSIS — R002 Palpitations: Secondary | ICD-10-CM

## 2018-06-30 DIAGNOSIS — M1611 Unilateral primary osteoarthritis, right hip: Secondary | ICD-10-CM

## 2018-06-30 NOTE — Patient Instructions (Signed)
  GO TO THE FRONT DESK Schedule your next appointment for a  Physical exam in 4 to 5 months

## 2018-06-30 NOTE — Assessment & Plan Note (Signed)
Palpitations: See last visit, labs were unremarkable, echocardiogram show question of pulmonary hypertension, I recommended a chest x-ray and PFT for further eval, patient declined. At this point she is asymptomatic, we agreed on no further evaluation ; she thinks in retrospect that palpitations were from taking "too much Tylenol". Hip pain: Ongoing, to see Dr. Mina Marble this week  Tinnitus, decreased hearing: Last visit with ENT was 12-2017, no further evaluation was suggested. DM, hyperlipidemia: Labs and medications reviewed, all up-to-date, reassess on RTC. RTC 4 to 5 months CPX

## 2018-06-30 NOTE — Progress Notes (Signed)
Subjective:    Patient ID: [name], female    DOB: [birth year], 84 y.o.   MRN: [id number]  DOS:  06/30/2018 Type of visit - description : f/u  Interval history: Follow-up from previous visit. She had palpitations, they have resolved. Work-up was done and it was reviewed with the patient. Continue with hip pain, to see her specialist this week. Decreased hearing and mild tinnitus.  Stable   Review of Systems   Past Medical History:  Diagnosis Date  . Abnormal breast exam 2004   MMG neg (eval by surgeon neg)  . Anemia   . Arthritis    "knees" (07/11/2014)  . History of blood transfusion    "related to a surgery, I think"  . Hx SBO 2004 , 2007, 2012    exploratory surgery 9-12   . Iritis 2005  . Osteoarthritis   . Osteopenia   . SBO (small bowel obstruction) ([facility]) 07-2014  . Type II diabetes mellitus ([place])     Past Surgical History:  Procedure Laterality Date  . APPENDECTOMY    . CATARACT EXTRACTION Bilateral   . DILATION AND CURETTAGE OF UTERUS    . EXPLORATORY LAPAROTOMY  07/2011   SBO  . TOTAL ABDOMINAL HYSTERECTOMY  1970's   for bleeding, no cancer  . TOTAL HIP ARTHROPLASTY Right 06/23/2016   Procedure: TOTAL HIP ARTHROPLASTY ANTERIOR APPROACH;  Surgeon: Dorna Leitz, MD;  Location: [facility];  Service: Orthopedics;  Laterality: Right;  . TOTAL KNEE ARTHROPLASTY Right 10/2010    Social History   Socioeconomic History  . Marital status: Widowed    Spouse name: Not on file  . Number of children: 3  . Years of education: Not on file  . Highest education level: Not on file  Occupational History  . Occupation: retired     Comment: makes aprons   Social Needs  . Financial resource strain: Not on file  . Food insecurity:    Worry: Not on file    Inability: Not on file  . Transportation needs:    Medical: Not on file    Non-medical: Not on file  Tobacco Use  . Smoking status: Never Smoker  . Smokeless tobacco: Never Used  Substance and Sexual Activity   . Alcohol use: No    Alcohol/week: 0.0 standard drinks  . Drug use: No  . Sexual activity: Never  Lifestyle  . Physical activity:    Days per week: Not on file    Minutes per session: Not on file  . Stress: Not on file  Relationships  . Social connections:    Talks on phone: Not on file    Gets together: Not on file    Attends religious service: Not on file    Active member of club or organization: Not on file    Attends meetings of clubs or organizations: Not on file    Relationship status: Not on file  . Intimate partner violence:    Fear of current or ex partner: Not on file    Emotionally abused: Not on file    Physically abused: Not on file    Forced sexual activity: Not on file  Other Topics Concern  . Not on file  Social History Narrative   Lives by herself, her family is in [place], lost 2 sisters       Lost a son 11-2015   Daughter in [place]   Right handed   No caffeine      Allergies as  of 06/30/2018      Reactions   Fentanyl Rash   Rash at injection site   Lipitor [atorvastatin] Other (See Comments)   tinnitus   Zocor [simvastatin] Other (See Comments)   Ear ringing      Medication List        Accurate as of 06/30/18  7:43 PM. Always use your most recent med list.          acetaminophen 650 MG CR tablet Commonly known as:  TYLENOL Take 650-1,300 mg by mouth every 8 (eight) hours as needed for pain.   aspirin EC 81 MG tablet Take 81 mg by mouth daily.   CENTRUM SILVER PO Take 1 capsule by mouth daily.   cholecalciferol 1000 units tablet Commonly known as:  VITAMIN D Take 2,000 Units by mouth daily.   Fish Oil 1000 MG Caps Take 2 capsules by mouth daily.   glucose blood test strip Check blood sugar no more than twice daily   metFORMIN 500 MG tablet Commonly known as:  GLUCOPHAGE Take 1 tablet (500 mg total) by mouth 2 (two) times daily with a meal.   ONE TOUCH DELICA LANCING DEV Misc To check blood sugars as directed   onetouch  ultrasoft lancets Check blood sugar no more than twice daily   pravastatin 20 MG tablet Commonly known as:  PRAVACHOL Take 1 tablet (20 mg total) by mouth daily.          Objective:   Physical Exam BP 112/70 (BP Location: Left Arm, Patient Position: Sitting, Cuff Size: Small)   Pulse 85   Temp 98.6 F (37 C) (Oral)   Resp 16   Ht 5\' 5"  (1.651 m)   Wt 194 lb (88 kg)   SpO2 96%   BMI 32.28 kg/m  General:   Well developed, NAD, see BMI.  HEENT:  Normocephalic . Face symmetric, atraumatic Lungs:  CTA B Normal respiratory effort, no intercostal retractions, no accessory muscle use. Heart: RRR,  no murmur.  No pretibial edema bilaterally  Skin: Not pale. Not jaundice Neurologic:  alert & oriented X3.  Speech normal, gait limited by DJD, uses a cane. Psych--  Cognition and judgment appear intact.  Cooperative with normal attention span and concentration.  Behavior appropriate. No anxious or depressed appearing.      Assessment & Plan:  Assessment DM  hyperlipidemia Osteopenia-- dexa 05-2015   T score -1.4 Osteoarthritis Iritis Multiple SBO, exploratory surgery 2012 Tinnitus chronic, c/o HOH: saw neuro 11/2017, MRI was done, no report, then she saw ENT (Rx conservative treatment)  PLAN: Palpitations: See last visit, labs were unremarkable, echocardiogram show question of pulmonary hypertension, I recommended a chest x-ray and PFT for further eval, patient declined. At this point she is asymptomatic, we agreed on no further evaluation ; she thinks in retrospect that palpitations were from taking "too much Tylenol". Hip pain: Ongoing, to see Dr. Mina Marble this week  Tinnitus, decreased hearing: Last visit with ENT was 12-2017, no further evaluation was suggested. DM, hyperlipidemia: Labs and medications reviewed, all up-to-date, reassess on RTC. RTC 4 to 5 months CPX

## 2018-07-02 DIAGNOSIS — M48061 Spinal stenosis, lumbar region without neurogenic claudication: Secondary | ICD-10-CM | POA: Diagnosis not present

## 2018-08-02 ENCOUNTER — Ambulatory Visit: Payer: Medicare Other

## 2018-08-16 ENCOUNTER — Ambulatory Visit (INDEPENDENT_AMBULATORY_CARE_PROVIDER_SITE_OTHER): Payer: Medicare Other

## 2018-08-16 DIAGNOSIS — Z23 Encounter for immunization: Secondary | ICD-10-CM | POA: Diagnosis not present

## 2018-09-21 ENCOUNTER — Telehealth: Payer: Self-pay

## 2018-09-21 NOTE — Telephone Encounter (Signed)
Pt. Is at the dentist office and wanted to know her medication allergies - reviewed with dental hygienist, Caryl Pina.

## 2018-09-27 DIAGNOSIS — M48062 Spinal stenosis, lumbar region with neurogenic claudication: Secondary | ICD-10-CM | POA: Diagnosis not present

## 2018-10-25 ENCOUNTER — Other Ambulatory Visit: Payer: Self-pay | Admitting: Internal Medicine

## 2018-11-16 ENCOUNTER — Encounter: Payer: Medicare Other | Admitting: Internal Medicine

## 2018-11-17 ENCOUNTER — Encounter: Payer: Self-pay | Admitting: Internal Medicine

## 2018-11-17 ENCOUNTER — Ambulatory Visit (INDEPENDENT_AMBULATORY_CARE_PROVIDER_SITE_OTHER): Payer: Medicare Other | Admitting: Internal Medicine

## 2018-11-17 VITALS — BP 136/84 | HR 84 | Temp 98.2°F | Resp 16 | Ht 65.0 in | Wt 198.1 lb

## 2018-11-17 DIAGNOSIS — E119 Type 2 diabetes mellitus without complications: Secondary | ICD-10-CM | POA: Diagnosis not present

## 2018-11-17 DIAGNOSIS — Z0000 Encounter for general adult medical examination without abnormal findings: Secondary | ICD-10-CM | POA: Diagnosis not present

## 2018-11-17 DIAGNOSIS — Z23 Encounter for immunization: Secondary | ICD-10-CM | POA: Diagnosis not present

## 2018-11-17 DIAGNOSIS — E785 Hyperlipidemia, unspecified: Secondary | ICD-10-CM

## 2018-11-17 DIAGNOSIS — M48062 Spinal stenosis, lumbar region with neurogenic claudication: Secondary | ICD-10-CM | POA: Diagnosis not present

## 2018-11-17 DIAGNOSIS — M47816 Spondylosis without myelopathy or radiculopathy, lumbar region: Secondary | ICD-10-CM | POA: Diagnosis not present

## 2018-11-17 LAB — MICROALBUMIN / CREATININE URINE RATIO
Creatinine,U: 80.3 mg/dL
Microalb Creat Ratio: 1 mg/g (ref 0.0–30.0)
Microalb, Ur: 0.8 mg/dL (ref 0.0–1.9)

## 2018-11-17 LAB — LIPID PANEL
Cholesterol: 169 mg/dL (ref 0–200)
HDL: 59.5 mg/dL (ref >39.00)
LDL Cholesterol: 91 mg/dL (ref 0–99)
NonHDL: 109.59
Total CHOL/HDL Ratio: 3
Triglycerides: 94 mg/dL (ref 0.0–149.0)
VLDL: 18.8 mg/dL (ref 0.0–40.0)

## 2018-11-17 LAB — BASIC METABOLIC PANEL
BUN: 14 mg/dL (ref 6–23)
CALCIUM: 9.6 mg/dL (ref 8.4–10.5)
CO2: 28 mEq/L (ref 19–32)
Chloride: 104 mEq/L (ref 96–112)
Creatinine, Ser: 0.76 mg/dL (ref 0.40–1.20)
GFR: 92.75 mL/min (ref >60.00)
Glucose, Bld: 104 mg/dL — ABNORMAL HIGH (ref 70–99)
Potassium: 4.2 mEq/L (ref 3.5–5.1)
Sodium: 140 mEq/L (ref 135–145)

## 2018-11-17 LAB — ALT: ALT: 12 U/L (ref 0–35)

## 2018-11-17 LAB — AST: AST: 16 U/L (ref 0–37)

## 2018-11-17 LAB — HEMOGLOBIN A1C: Hgb A1c MFr Bld: 6.7 % — ABNORMAL HIGH (ref 4.6–6.5)

## 2018-11-17 MED ORDER — ZOSTER VAC RECOMB ADJUVANTED 50 MCG/0.5ML IM SUSR: 0.5000 mL | Freq: Once | INTRAMUSCULAR | 1 refills | Status: AC

## 2018-11-17 NOTE — Patient Instructions (Signed)
GO TO THE LAB : Get the blood work     GO TO THE FRONT DESK Schedule your next appointment for a   checkup in 6 months 

## 2018-11-17 NOTE — Progress Notes (Signed)
Pre visit review using our clinic review tool, if applicable. No additional management support is needed unless otherwise documented below in the visit note. 

## 2018-11-17 NOTE — Assessment & Plan Note (Addendum)
-  Td 11/2018;  Pneumonia shot 10-05 and 2010;  Prevnar: 2016;  shingles shot :2014; s/p flu shot  ; shingrix rx printed  -CCS:  Cscope 2004 normal   Cscope 03/2014 Diverticulosis, no need to repeat due to age and results  -No further PAPs, see previous entry -Pt desires no more MMG , agree;  Ok per  guidelines   -Diet-exercise: Discussed -Labs: BMP, AST, ALT, FLP, A1c

## 2018-11-17 NOTE — Progress Notes (Signed)
Subjective:    Patient ID: [name], female    DOB: [birth year], 85 y.o.   MRN: [id number]  DOS:  11/17/2018 Type of visit - description: Physical exam Other than back pain she feels well    Review of Systems Still has occasional palpitations, typically when she takes gabapentin and Tylenol together.  No associated symptoms; specifically no chest pain, dizziness, LOC.  Other than above, a 14 point review of systems is negative     Past Medical History:  Diagnosis Date  . Abnormal breast exam 2004   MMG neg (eval by surgeon neg)  . Anemia   . Arthritis    "knees" (07/11/2014)  . History of blood transfusion    "related to a surgery, I think"  . Hx SBO 2004 , 2007, 2012    exploratory surgery 9-12   . Iritis 2005  . Osteoarthritis   . Osteopenia   . SBO (small bowel obstruction) ([place]) 07-2014  . Type II diabetes mellitus ([place])     Past Surgical History:  Procedure Laterality Date  . APPENDECTOMY    . CATARACT EXTRACTION Bilateral   . DILATION AND CURETTAGE OF UTERUS    . EXPLORATORY LAPAROTOMY  07/2011   SBO  . TOTAL ABDOMINAL HYSTERECTOMY  1970's   for bleeding, no cancer  . TOTAL HIP ARTHROPLASTY Right 06/23/2016   Procedure: TOTAL HIP ARTHROPLASTY ANTERIOR APPROACH;  Surgeon: Dorna Leitz, MD;  Location: [facility];  Service: Orthopedics;  Laterality: Right;  . TOTAL KNEE ARTHROPLASTY Right 10/2010    Social History   Socioeconomic History  . Marital status: Widowed    Spouse name: Not on file  . Number of children: 3  . Years of education: Not on file  . Highest education level: Not on file  Occupational History  . Occupation: retired     Comment: makes aprons   Social Needs  . Financial resource strain: Not on file  . Food insecurity:    Worry: Not on file    Inability: Not on file  . Transportation needs:    Medical: Not on file    Non-medical: Not on file  Tobacco Use  . Smoking status: Never Smoker  . Smokeless tobacco: Never Used  Substance  and Sexual Activity  . Alcohol use: No    Alcohol/week: 0.0 standard drinks  . Drug use: No  . Sexual activity: Never  Lifestyle  . Physical activity:    Days per week: Not on file    Minutes per session: Not on file  . Stress: Not on file  Relationships  . Social connections:    Talks on phone: Not on file    Gets together: Not on file    Attends religious service: Not on file    Active member of club or organization: Not on file    Attends meetings of clubs or organizations: Not on file    Relationship status: Not on file  . Intimate partner violence:    Fear of current or ex partner: Not on file    Emotionally abused: Not on file    Physically abused: Not on file    Forced sexual activity: Not on file  Other Topics Concern  . Not on file  Social History Narrative   Lives by herself, her family is in [place], lost 2 sisters   Lost a son 11-2015   Daughter in [place]   Right handed   No caffeine     Family History  Problem Relation Age of Onset  . Diabetes Father   . Breast cancer Sister   . Brain cancer Sister   . Sudden death Sister   . Renal Disease Son   . Heart attack Neg Hx   . Colon cancer Neg Hx      Allergies as of 11/17/2018      Reactions   Fentanyl Rash   Rash at injection site   Lipitor [atorvastatin] Other (See Comments)   tinnitus   Zocor [simvastatin] Other (See Comments)   Ear ringing      Medication List       Accurate as of November 17, 2018 11:59 PM. Always use your most recent med list.        acetaminophen 650 MG CR tablet Commonly known as:  TYLENOL Take 650-1,300 mg by mouth every 8 (eight) hours as needed for pain.   aspirin EC 81 MG tablet Take 81 mg by mouth daily.   CENTRUM SILVER PO Take 1 capsule by mouth daily.   cholecalciferol 1000 units tablet Commonly known as:  VITAMIN D Take 2,000 Units by mouth daily.   Fish Oil 1000 MG Caps Take 2 capsules by mouth daily.   gabapentin 300 MG capsule Commonly known as:   NEURONTIN Take 1 capsule by mouth 3 (three) times daily.   glucose blood test strip Commonly known as:  ONE TOUCH ULTRA TEST Check blood sugar no more than twice daily   metFORMIN 500 MG tablet Commonly known as:  GLUCOPHAGE Take 1 tablet (500 mg total) by mouth 2 (two) times daily with a meal.   ONE TOUCH DELICA LANCING DEV Misc To check blood sugars as directed   onetouch ultrasoft lancets Check blood sugar no more than twice daily   pravastatin 20 MG tablet Commonly known as:  PRAVACHOL Take 1 tablet (20 mg total) by mouth daily.   Zoster Vaccine Adjuvanted injection Commonly known as:  SHINGRIX Inject 0.5 mLs into the muscle once for 1 dose.           Objective:   Physical Exam BP 136/84 (BP Location: Left Arm, Patient Position: Sitting, Cuff Size: Normal)   Pulse 84   Temp 98.2 F (36.8 C) (Oral)   Resp 16   Ht 5\' 5"  (1.651 m)   Wt 198 lb 2 oz (89.9 kg)   SpO2 96%   BMI 32.97 kg/m  General: Well developed, NAD, BMI noted Neck: No  thyromegaly  HEENT:  Normocephalic . Face symmetric, atraumatic Lungs:  CTA B Normal respiratory effort, no intercostal retractions, no accessory muscle use. Heart: RRR,  no murmur.  No pretibial edema bilaterally  Abdomen:  Not distended, soft, non-tender. No rebound or rigidity.   Skin: Exposed areas without rash. Not pale. Not jaundice Neurologic:  alert & oriented X3.  Speech normal, gait somewhat antalgic due to low back pain but unassisted. Pain noted when she lay down in the table to be examined. Strength symmetric and appropriate for age.  Psych: Cognition and judgment appear intact.  Cooperative with normal attention span and concentration.  Behavior appropriate. No anxious or depressed appearing.     Assessment    Assessment DM  hyperlipidemia Osteopenia-- dexa 05-2015   T score -1.4 Osteoarthritis, chronic back pain (Dr Mina Marble note from 09/27/2018 : MRI show with severe central canal stenosis, L4-L5, DX  neurologic claudication, had local injections, no much help, then referred to surgery) . Iritis Multiple SBO, exploratory surgery 2012 Tinnitus chronic, c/o HOH: saw  neuro 11/2017, MRI was done, no report, then she saw ENT (Rx conservative treatment)  PLAN: [id number] on metformin, check A1c and micro High cholesterol: On Pravachol, checking labs Osteopenia: Last bone density test 2016, consider a bone density test next year. Hip pain, back pain: Chronic issue, seen elsewhere, was prescribed gabapentin without much help, rec to d/w Dr. Mina Marble if she needs to continue gabapentin.  She also saw Dr. Vertell Limber who is contemplating surgery. (Dr Mina Marble note from 09/27/2018 reviewed: MRI show with severe central canal stenosis, L4-L5, DX neurologic claudication, had local injections, no much help, then referred to surgery) . Mild chronic anemia, see labs, recheck on RTC Palpitations: Still has a sporadic symptoms.see last OV, observation RTC 6 months

## 2018-11-18 ENCOUNTER — Other Ambulatory Visit: Payer: Self-pay | Admitting: Neurological Surgery

## 2018-11-18 NOTE — Assessment & Plan Note (Signed)
AY:[id number] on metformin, check A1c and micro High cholesterol: On Pravachol, checking labs Osteopenia: Last bone density test 2016, consider a bone density test next year. Hip pain, back pain: Chronic issue, seen elsewhere, was prescribed gabapentin without much help, rec to d/w Dr. Mina Marble if she needs to continue gabapentin.  She also saw Dr. Vertell Limber who is contemplating surgery. (Dr Mina Marble note from 09/27/2018 reviewed: MRI show with severe central canal stenosis, L4-L5, DX neurologic claudication, had local injections, no much help, then referred to surgery) . Mild chronic anemia, see labs, recheck on RTC Palpitations: Still has a sporadic symptoms.see last OV, observation RTC 6 months

## 2018-11-28 ENCOUNTER — Other Ambulatory Visit: Payer: Self-pay | Admitting: Internal Medicine

## 2018-12-28 ENCOUNTER — Other Ambulatory Visit: Payer: Self-pay

## 2018-12-28 ENCOUNTER — Encounter (HOSPITAL_COMMUNITY): Payer: Self-pay

## 2018-12-28 ENCOUNTER — Encounter (HOSPITAL_COMMUNITY)
Admission: RE | Admit: 2018-12-28 | Discharge: 2018-12-28 | Disposition: A | Discharge status: Home / Self Care | Payer: Medicare Other | Source: Ambulatory Visit | Attending: Neurological Surgery | Admitting: Neurological Surgery

## 2018-12-28 DIAGNOSIS — Z01812 Encounter for preprocedural laboratory examination: Secondary | ICD-10-CM | POA: Diagnosis not present

## 2018-12-28 LAB — CBC
HCT: 38.7 % (ref 36.0–46.0)
Hemoglobin: 11.8 g/dL — ABNORMAL LOW (ref 12.0–15.0)
MCH: 26.4 pg (ref 26.0–34.0)
MCHC: 30.5 g/dL (ref 30.0–36.0)
MCV: 86.6 fL (ref 80.0–100.0)
Platelets: 220 10*3/uL (ref 150–400)
RBC: 4.47 MIL/uL (ref 3.87–5.11)
RDW: 13.8 % (ref 11.5–15.5)
WBC: 7.9 10*3/uL (ref 4.0–10.5)
nRBC: 0 % (ref 0.0–0.2)

## 2018-12-28 LAB — BASIC METABOLIC PANEL
Anion gap: 9 (ref 5–15)
BUN: 11 mg/dL (ref 8–23)
CO2: 26 mmol/L (ref 22–32)
Calcium: 9.6 mg/dL (ref 8.9–10.3)
Chloride: 104 mmol/L (ref 98–111)
Creatinine, Ser: 0.8 mg/dL (ref 0.44–1.00)
GFR calc Af Amer: >60 mL/min (ref >60)
GFR calc non Af Amer: >60 mL/min (ref >60)
Glucose, Bld: 100 mg/dL — ABNORMAL HIGH (ref 70–99)
POTASSIUM: 4 mmol/L (ref 3.5–5.1)
SODIUM: 139 mmol/L (ref 135–145)

## 2018-12-28 LAB — GLUCOSE, CAPILLARY: Glucose-Capillary: 114 mg/dL — ABNORMAL HIGH (ref 70–99)

## 2018-12-28 LAB — SURGICAL PCR SCREEN
MRSA, PCR: NEGATIVE
STAPHYLOCOCCUS AUREUS: NEGATIVE

## 2018-12-28 NOTE — Pre-Procedure Instructions (Addendum)
[name]  12/28/2018     [id number] [place], [place] - [address] [place] [address] Phone: [phone] Fax: [phone]   Your procedure is scheduled on Monday, January 03, 2019  Report to [facility] at 8:40 A.M..  Call this number if you have problems the morning of surgery:  [phone]   Remember:  Do not eat or drink after midnight   Take these medicines the morning of surgery with A SIP OF WATER  acetaminophen (TYLENOL) - as needed gabapentin (NEURONTIN)   As of today STOP taking any Aspirin (unless otherwise instructed by your surgeon), Aleve, Naproxen, Ibuprofen, Motrin, Advil, Goody's, BC's, all herbal medications, fish oil, and all vitamins.  Follow your surgeon's instructions on when to stop Asprin.  If no instructions were given by your surgeon then you will need to call the office to get those instructions.    How to Manage Your Diabetes Before and After Surgery  Why is it important to control my blood sugar before and after surgery? . Improving blood sugar levels before and after surgery helps healing and can limit problems. . A way of improving blood sugar control is eating a healthy diet by: o  Eating less sugar and carbohydrates o  Increasing activity/exercise o  Talking with your doctor about reaching your blood sugar goals . High blood sugars (greater than 180 mg/dL) can raise your risk of infections and slow your recovery, so you will need to focus on controlling your diabetes during the weeks before surgery. . Make sure that the doctor who takes care of your diabetes knows about your planned surgery including the date and location.  How do I manage my blood sugar before surgery? . Check your blood sugar at least 4 times a day, starting 2 days before surgery, to make sure that the level is not too high or low. o Check your blood sugar the morning of your surgery when you wake  up and every 2 hours until you get to the Short Stay unit. . If your blood sugar is less than 70 mg/dL, you will need to treat for low blood sugar: o Do not take insulin. o Treat a low blood sugar (less than 70 mg/dL) with  cup of clear juice (cranberry or apple), 4 glucose tablets, OR glucose gel. Recheck blood sugar in 15 minutes after treatment (to make sure it is greater than 70 mg/dL). If your blood sugar is not greater than 70 mg/dL on recheck, call [phone] o  for further instructions. . Report your blood sugar to the short stay nurse when you get to Short Stay.  . If you are admitted to the hospital after surgery: o Your blood sugar will be checked by the staff and you will probably be given insulin after surgery (instead of oral diabetes medicines) to make sure you have good blood sugar levels. o The goal for blood sugar control after surgery is 80-180 mg/dL.   WHAT DO I DO ABOUT MY DIABETES MEDICATION?  Marland Kitchen Do not take metFORMIN (GLUCOPHAGE) oral diabetes medicines (pills) the morning of surgery.  . The day of surgery, do not take other diabetes injectables, including Byetta (exenatide), Bydureon (exenatide ER), Victoza (liraglutide), or Trulicity (dulaglutide)  . If your CBG is greater than 220 mg/dL, you may take  of your sliding scale (correction) dose of insulin.  Reviewed and Endorsed by [facility] Patient Education Committee, August 2015  Do not wear jewelry, make-up or nail polish.  Do not wear lotions, powders, or perfumes, or deodorant.  Do not shave 48 hours prior to surgery.  Men may shave face and neck.  Do not bring valuables to the hospital.  [facility] is not responsible for any belongings or valuables.  Contacts, dentures or bridgework may not be worn into surgery.  Leave your suitcase in the car.  After surgery it may be brought to your room.  For patients admitted to the hospital, discharge time will be determined by your treatment team.  Patients  discharged the day of surgery will not be allowed to drive home.   Special instructions:   [place]- Preparing For Surgery Before surgery, you can play an important role. Because skin is not sterile, your skin needs to be as free of germs as possible. You can reduce the number of germs on your skin by washing with CHG (chlorahexidine gluconate) Soap before surgery.  CHG is an antiseptic cleaner which kills germs and bonds with the skin to continue killing germs even after washing.    Oral Hygiene is also important to reduce your risk of infection.  Remember - BRUSH YOUR TEETH THE MORNING OF SURGERY WITH YOUR REGULAR TOOTHPASTE  Please do not use if you have an allergy to CHG or antibacterial soaps. If your skin becomes reddened/irritated stop using the CHG.  Do not shave (including legs and underarms) for at least 48 hours prior to first CHG shower. It is OK to shave your face.  Please follow these instructions carefully.   1. Shower the NIGHT BEFORE SURGERY and the MORNING OF SURGERY with CHG.   2. If you chose to wash your hair, wash your hair first as usual with your normal shampoo.  3. After you shampoo, rinse your hair and body thoroughly to remove the shampoo.  4. Use CHG as you would any other liquid soap. You can apply CHG directly to the skin and wash gently with a scrungie or a clean washcloth.   5. Apply the CHG Soap to your body ONLY FROM THE NECK DOWN.  Do not use on open wounds or open sores. Avoid contact with your eyes, ears, mouth and genitals (private parts). Wash Face and genitals (private parts)  with your normal soap.  6. Wash thoroughly, paying special attention to the area where your surgery will be performed.  7. Thoroughly rinse your body with warm water from the neck down.  8. DO NOT shower/wash with your normal soap after using and rinsing off the CHG Soap.  9. Pat yourself dry with a CLEAN TOWEL.  10. Wear CLEAN PAJAMAS to bed the night before surgery,  wear comfortable clothes the morning of surgery  11. Place CLEAN SHEETS on your bed the night of your first shower and DO NOT SLEEP WITH PETS.  Day of Surgery:  Do not apply any deodorants/lotions.  Please wear clean clothes to the hospital/surgery center.   Remember to brush your teeth WITH YOUR REGULAR TOOTHPASTE.   Please read over the following fact sheets that you were given. Pain Booklet, Coughing and Deep Breathing, MRSA Information and Surgical Site Infection Prevention

## 2018-12-28 NOTE — Progress Notes (Signed)
PCP -  Dr. Kathlene November Cardiologist - denies  Chest x-ray - denies EKG -  03/16/18 Stress Test - 01/01/16 ECHO - 03/19/18 Cardiac Cath - denies  Sleep Study - denies CPAP - N/A   Fasting Blood Sugar - 91-120 Checks Blood Sugar: 2 times a week  Blood Thinner Instructions: N/A Aspirin Instructions: per pt. LD 12/25/2018  Anesthesia review: No  Patient denies shortness of breath, fever, cough and chest pain at PAT appointment  Patient verbalized understanding of instructions that were given to them at the PAT appointment. Patient was also instructed that they will need to review over the PAT instructions again at home before surgery.

## 2018-12-29 LAB — TYPE AND SCREEN
ABO/RH(D): A POS
ANTIBODY SCREEN: NEGATIVE

## 2019-01-03 ENCOUNTER — Inpatient Hospital Stay (HOSPITAL_COMMUNITY): Payer: Medicare Other

## 2019-01-03 ENCOUNTER — Encounter (HOSPITAL_COMMUNITY): Payer: Self-pay

## 2019-01-03 ENCOUNTER — Inpatient Hospital Stay (HOSPITAL_COMMUNITY): Payer: Medicare Other | Admitting: Certified Registered Nurse Anesthetist

## 2019-01-03 ENCOUNTER — Inpatient Hospital Stay (HOSPITAL_COMMUNITY): Payer: Medicare Other | Admitting: Physician Assistant

## 2019-01-03 ENCOUNTER — Inpatient Hospital Stay (HOSPITAL_COMMUNITY)
Admission: RE | Admit: 2019-01-03 | Discharge: 2019-01-07 | DRG: 455 | Disposition: A | Discharge status: Home Health | Payer: Medicare Other | Attending: Neurological Surgery | Admitting: Neurological Surgery

## 2019-01-03 ENCOUNTER — Inpatient Hospital Stay (HOSPITAL_COMMUNITY)
Admission: RE | Disposition: A | Discharge status: Home Health | Payer: Self-pay | Source: Home / Self Care | Attending: Neurological Surgery

## 2019-01-03 DIAGNOSIS — R269 Unspecified abnormalities of gait and mobility: Secondary | ICD-10-CM | POA: Diagnosis not present

## 2019-01-03 DIAGNOSIS — M4316 Spondylolisthesis, lumbar region: Secondary | ICD-10-CM | POA: Diagnosis not present

## 2019-01-03 DIAGNOSIS — M48062 Spinal stenosis, lumbar region with neurogenic claudication: Secondary | ICD-10-CM | POA: Diagnosis not present

## 2019-01-03 DIAGNOSIS — M5416 Radiculopathy, lumbar region: Secondary | ICD-10-CM | POA: Diagnosis not present

## 2019-01-03 DIAGNOSIS — Z7982 Long term (current) use of aspirin: Secondary | ICD-10-CM | POA: Diagnosis not present

## 2019-01-03 DIAGNOSIS — M549 Dorsalgia, unspecified: Secondary | ICD-10-CM | POA: Diagnosis present

## 2019-01-03 DIAGNOSIS — K5900 Constipation, unspecified: Secondary | ICD-10-CM | POA: Diagnosis not present

## 2019-01-03 DIAGNOSIS — M21372 Foot drop, left foot: Secondary | ICD-10-CM | POA: Diagnosis not present

## 2019-01-03 DIAGNOSIS — Z7984 Long term (current) use of oral hypoglycemic drugs: Secondary | ICD-10-CM

## 2019-01-03 DIAGNOSIS — Z791 Long term (current) use of non-steroidal anti-inflammatories (NSAID): Secondary | ICD-10-CM

## 2019-01-03 DIAGNOSIS — M4326 Fusion of spine, lumbar region: Secondary | ICD-10-CM | POA: Diagnosis not present

## 2019-01-03 DIAGNOSIS — M21371 Foot drop, right foot: Secondary | ICD-10-CM | POA: Diagnosis not present

## 2019-01-03 DIAGNOSIS — Z96641 Presence of right artificial hip joint: Secondary | ICD-10-CM | POA: Diagnosis not present

## 2019-01-03 DIAGNOSIS — M48061 Spinal stenosis, lumbar region without neurogenic claudication: Secondary | ICD-10-CM | POA: Diagnosis not present

## 2019-01-03 DIAGNOSIS — E119 Type 2 diabetes mellitus without complications: Secondary | ICD-10-CM | POA: Diagnosis not present

## 2019-01-03 DIAGNOSIS — Z79899 Other long term (current) drug therapy: Secondary | ICD-10-CM | POA: Diagnosis not present

## 2019-01-03 DIAGNOSIS — Z87891 Personal history of nicotine dependence: Secondary | ICD-10-CM | POA: Diagnosis not present

## 2019-01-03 DIAGNOSIS — Z419 Encounter for procedure for purposes other than remedying health state, unspecified: Secondary | ICD-10-CM

## 2019-01-03 DIAGNOSIS — R2689 Other abnormalities of gait and mobility: Secondary | ICD-10-CM | POA: Diagnosis not present

## 2019-01-03 LAB — GLUCOSE, CAPILLARY
GLUCOSE-CAPILLARY: 103 mg/dL — AB (ref 70–99)
GLUCOSE-CAPILLARY: 220 mg/dL — AB (ref 70–99)
Glucose-Capillary: 80 mg/dL (ref 70–99)
Glucose-Capillary: 101 mg/dL — ABNORMAL HIGH (ref 70–99)
Glucose-Capillary: 130 mg/dL — ABNORMAL HIGH (ref 70–99)

## 2019-01-03 SURGERY — POSTERIOR LUMBAR FUSION 1 LEVEL
Anesthesia: General | Site: Spine Lumbar

## 2019-01-03 MED ORDER — FENTANYL CITRATE (PF) 250 MCG/5ML IJ SOLN
INTRAMUSCULAR | Status: AC
  Filled 2019-01-03: qty 5

## 2019-01-03 MED ORDER — THROMBIN 5000 UNITS EX SOLR
OROMUCOSAL | Status: DC | PRN
  Administered 2019-01-03 (×2): via TOPICAL

## 2019-01-03 MED ORDER — POLYETHYLENE GLYCOL 3350 17 G PO PACK
17.0000 g | PACK | Freq: Every day | ORAL | Status: DC | PRN
  Administered 2019-01-06 – 2019-01-07 (×2): 17 g via ORAL
  Filled 2019-01-03 (×2): qty 1

## 2019-01-03 MED ORDER — THROMBIN 5000 UNITS EX SOLR
CUTANEOUS | Status: AC
  Filled 2019-01-03: qty 5000

## 2019-01-03 MED ORDER — PROPOFOL 10 MG/ML IV BOLUS
INTRAVENOUS | Status: DC | PRN
  Administered 2019-01-03: 160 mg via INTRAVENOUS

## 2019-01-03 MED ORDER — PROPOFOL 10 MG/ML IV BOLUS
INTRAVENOUS | Status: AC
  Filled 2019-01-03: qty 20

## 2019-01-03 MED ORDER — ONDANSETRON HCL 4 MG/2ML IJ SOLN: 4.0000 mg | Freq: Four times a day (QID) | INTRAMUSCULAR | Status: DC | PRN

## 2019-01-03 MED ORDER — ACETAMINOPHEN 325 MG PO TABS
650.0000 mg | ORAL_TABLET | ORAL | Status: DC | PRN
  Administered 2019-01-03: 650 mg via ORAL
  Filled 2019-01-03 (×2): qty 2

## 2019-01-03 MED ORDER — BISACODYL 10 MG RE SUPP
10.0000 mg | Freq: Every day | RECTAL | Status: DC | PRN
  Administered 2019-01-07: 10 mg via RECTAL
  Filled 2019-01-03: qty 1

## 2019-01-03 MED ORDER — FLEET ENEMA 7-19 GM/118ML RE ENEM
1.0000 | ENEMA | Freq: Once | RECTAL | Status: AC | PRN
  Administered 2019-01-07: 1 via RECTAL
  Filled 2019-01-03: qty 1

## 2019-01-03 MED ORDER — THROMBIN 20000 UNITS EX SOLR
CUTANEOUS | Status: AC
  Filled 2019-01-03: qty 20000

## 2019-01-03 MED ORDER — ROCURONIUM BROMIDE 50 MG/5ML IV SOSY
PREFILLED_SYRINGE | INTRAVENOUS | Status: AC
  Filled 2019-01-03: qty 20

## 2019-01-03 MED ORDER — LIDOCAINE-EPINEPHRINE 1 %-1:100000 IJ SOLN
INTRAMUSCULAR | Status: DC | PRN
  Administered 2019-01-03: 5 mL

## 2019-01-03 MED ORDER — INSULIN ASPART 100 UNIT/ML ~~LOC~~ SOLN
0.0000 [IU] | Freq: Three times a day (TID) | SUBCUTANEOUS | Status: DC
  Administered 2019-01-04: 8 [IU] via SUBCUTANEOUS
  Administered 2019-01-04: 3 [IU] via SUBCUTANEOUS
  Administered 2019-01-05: 2 [IU] via SUBCUTANEOUS
  Administered 2019-01-05 – 2019-01-07 (×2): 3 [IU] via SUBCUTANEOUS

## 2019-01-03 MED ORDER — ACETAMINOPHEN 650 MG RE SUPP: 650.0000 mg | RECTAL | Status: DC | PRN

## 2019-01-03 MED ORDER — LIDOCAINE HCL (CARDIAC) PF 100 MG/5ML IV SOSY
PREFILLED_SYRINGE | INTRAVENOUS | Status: DC | PRN
  Administered 2019-01-03: 80 mg via INTRAVENOUS

## 2019-01-03 MED ORDER — PHENOL 1.4 % MT LIQD: 1.0000 | OROMUCOSAL | Status: DC | PRN

## 2019-01-03 MED ORDER — LACTATED RINGERS IV SOLN
INTRAVENOUS | Status: DC
  Administered 2019-01-03: 10:00:00 via INTRAVENOUS

## 2019-01-03 MED ORDER — CEFAZOLIN SODIUM-DEXTROSE 2-4 GM/100ML-% IV SOLN
2.0000 g | INTRAVENOUS | Status: AC
  Administered 2019-01-03: 2 g via INTRAVENOUS

## 2019-01-03 MED ORDER — SUGAMMADEX SODIUM 200 MG/2ML IV SOLN
INTRAVENOUS | Status: DC | PRN
  Administered 2019-01-03: 200 mg via INTRAVENOUS

## 2019-01-03 MED ORDER — FENTANYL CITRATE (PF) 100 MCG/2ML IJ SOLN
25.0000 ug | INTRAMUSCULAR | Status: DC | PRN
  Administered 2019-01-03: 25 ug via INTRAVENOUS
  Administered 2019-01-03: 50 ug via INTRAVENOUS
  Administered 2019-01-03: 25 ug via INTRAVENOUS

## 2019-01-03 MED ORDER — MENTHOL 3 MG MT LOZG: 1.0000 | LOZENGE | OROMUCOSAL | Status: DC | PRN

## 2019-01-03 MED ORDER — METHOCARBAMOL 1000 MG/10ML IJ SOLN
500.0000 mg | Freq: Four times a day (QID) | INTRAVENOUS | Status: DC | PRN
  Filled 2019-01-03: qty 5

## 2019-01-03 MED ORDER — GABAPENTIN 300 MG PO CAPS
300.0000 mg | ORAL_CAPSULE | Freq: Three times a day (TID) | ORAL | Status: DC
  Administered 2019-01-03 – 2019-01-07 (×11): 300 mg via ORAL
  Filled 2019-01-03 (×12): qty 1

## 2019-01-03 MED ORDER — ONDANSETRON HCL 4 MG/2ML IJ SOLN
INTRAMUSCULAR | Status: DC | PRN
  Administered 2019-01-03: 4 mg via INTRAVENOUS

## 2019-01-03 MED ORDER — ROCURONIUM BROMIDE 100 MG/10ML IV SOLN
INTRAVENOUS | Status: DC | PRN
  Administered 2019-01-03: 20 mg via INTRAVENOUS
  Administered 2019-01-03: 50 mg via INTRAVENOUS
  Administered 2019-01-03 (×3): 10 mg via INTRAVENOUS

## 2019-01-03 MED ORDER — DOCUSATE SODIUM 100 MG PO CAPS
100.0000 mg | ORAL_CAPSULE | Freq: Two times a day (BID) | ORAL | Status: DC
  Administered 2019-01-03 – 2019-01-07 (×8): 100 mg via ORAL
  Filled 2019-01-03 (×8): qty 1

## 2019-01-03 MED ORDER — CEFAZOLIN SODIUM-DEXTROSE 2-4 GM/100ML-% IV SOLN
2.0000 g | Freq: Three times a day (TID) | INTRAVENOUS | Status: AC
  Administered 2019-01-03 – 2019-01-04 (×2): 2 g via INTRAVENOUS
  Filled 2019-01-03 (×3): qty 100

## 2019-01-03 MED ORDER — FENTANYL CITRATE (PF) 100 MCG/2ML IJ SOLN
INTRAMUSCULAR | Status: AC
  Administered 2019-01-03: 50 ug via INTRAVENOUS
  Filled 2019-01-03: qty 2

## 2019-01-03 MED ORDER — PHENYLEPHRINE HCL 10 MG/ML IJ SOLN
INTRAMUSCULAR | Status: DC | PRN
  Administered 2019-01-03: 80 ug via INTRAVENOUS

## 2019-01-03 MED ORDER — ONDANSETRON HCL 4 MG PO TABS: 4.0000 mg | ORAL_TABLET | Freq: Four times a day (QID) | ORAL | Status: DC | PRN

## 2019-01-03 MED ORDER — PHENYLEPHRINE 40 MCG/ML (10ML) SYRINGE FOR IV PUSH (FOR BLOOD PRESSURE SUPPORT)
PREFILLED_SYRINGE | INTRAVENOUS | Status: AC
  Filled 2019-01-03: qty 10

## 2019-01-03 MED ORDER — FENTANYL CITRATE (PF) 250 MCG/5ML IJ SOLN
INTRAMUSCULAR | Status: DC | PRN
  Administered 2019-01-03 (×3): 50 ug via INTRAVENOUS

## 2019-01-03 MED ORDER — CHLORHEXIDINE GLUCONATE CLOTH 2 % EX PADS: 6.0000 | MEDICATED_PAD | Freq: Once | CUTANEOUS | Status: DC

## 2019-01-03 MED ORDER — 0.9 % SODIUM CHLORIDE (POUR BTL) OPTIME
TOPICAL | Status: DC | PRN
  Administered 2019-01-03: 1000 mL

## 2019-01-03 MED ORDER — METHOCARBAMOL 500 MG PO TABS
500.0000 mg | ORAL_TABLET | Freq: Four times a day (QID) | ORAL | Status: DC | PRN
  Administered 2019-01-03 – 2019-01-06 (×4): 500 mg via ORAL
  Filled 2019-01-03 (×4): qty 1

## 2019-01-03 MED ORDER — MORPHINE SULFATE (PF) 2 MG/ML IV SOLN
2.0000 mg | INTRAVENOUS | Status: DC | PRN
  Administered 2019-01-04 (×2): 2 mg via INTRAVENOUS
  Filled 2019-01-03 (×2): qty 1

## 2019-01-03 MED ORDER — LACTATED RINGERS IV SOLN
INTRAVENOUS | Status: DC | PRN
  Administered 2019-01-03 (×2): via INTRAVENOUS

## 2019-01-03 MED ORDER — LIDOCAINE-EPINEPHRINE 1 %-1:100000 IJ SOLN
INTRAMUSCULAR | Status: AC
  Filled 2019-01-03: qty 1

## 2019-01-03 MED ORDER — CEFAZOLIN SODIUM-DEXTROSE 2-4 GM/100ML-% IV SOLN
INTRAVENOUS | Status: AC
  Filled 2019-01-03: qty 100

## 2019-01-03 MED ORDER — SODIUM CHLORIDE 0.9% FLUSH
3.0000 mL | Freq: Two times a day (BID) | INTRAVENOUS | Status: DC
  Administered 2019-01-04 – 2019-01-07 (×7): 3 mL via INTRAVENOUS

## 2019-01-03 MED ORDER — OXYCODONE-ACETAMINOPHEN 5-325 MG PO TABS
1.0000 | ORAL_TABLET | ORAL | Status: DC | PRN
  Administered 2019-01-04: 2 via ORAL
  Administered 2019-01-04 (×2): 1 via ORAL
  Administered 2019-01-04 – 2019-01-07 (×9): 2 via ORAL
  Filled 2019-01-03: qty 1
  Filled 2019-01-03 (×6): qty 2
  Filled 2019-01-03 (×2): qty 1
  Filled 2019-01-03: qty 2
  Filled 2019-01-03: qty 1
  Filled 2019-01-03 (×2): qty 2

## 2019-01-03 MED ORDER — PRAVASTATIN SODIUM 10 MG PO TABS
20.0000 mg | ORAL_TABLET | Freq: Every evening | ORAL | Status: DC
  Administered 2019-01-03 – 2019-01-07 (×5): 20 mg via ORAL
  Filled 2019-01-03 (×5): qty 2

## 2019-01-03 MED ORDER — LIDOCAINE 2% (20 MG/ML) 5 ML SYRINGE
INTRAMUSCULAR | Status: AC
  Filled 2019-01-03: qty 5

## 2019-01-03 MED ORDER — DEXAMETHASONE SODIUM PHOSPHATE 4 MG/ML IJ SOLN
INTRAMUSCULAR | Status: DC | PRN
  Administered 2019-01-03: 5 mg via INTRAVENOUS

## 2019-01-03 MED ORDER — LACTATED RINGERS IV SOLN
INTRAVENOUS | Status: DC
  Administered 2019-01-03: 23:00:00 via INTRAVENOUS

## 2019-01-03 MED ORDER — SODIUM CHLORIDE 0.9 % IV SOLN
INTRAVENOUS | Status: DC | PRN
  Administered 2019-01-03: 15:00:00

## 2019-01-03 MED ORDER — BUPIVACAINE HCL (PF) 0.5 % IJ SOLN
INTRAMUSCULAR | Status: DC | PRN
  Administered 2019-01-03: 20 mL
  Administered 2019-01-03: 5 mL

## 2019-01-03 MED ORDER — BUPIVACAINE HCL (PF) 0.5 % IJ SOLN
INTRAMUSCULAR | Status: AC
  Filled 2019-01-03: qty 30

## 2019-01-03 MED ORDER — SENNA 8.6 MG PO TABS
1.0000 | ORAL_TABLET | Freq: Two times a day (BID) | ORAL | Status: DC
  Administered 2019-01-03 – 2019-01-07 (×8): 8.6 mg via ORAL
  Filled 2019-01-03 (×8): qty 1

## 2019-01-03 MED ORDER — SODIUM CHLORIDE 0.9 % IV SOLN: 250.0000 mL | INTRAVENOUS | Status: DC

## 2019-01-03 MED ORDER — SODIUM CHLORIDE 0.9% FLUSH: 3.0000 mL | INTRAVENOUS | Status: DC | PRN

## 2019-01-03 SURGICAL SUPPLY — 65 items
ADH SKN CLS APL DERMABOND .7 (GAUZE/BANDAGES/DRESSINGS) ×1
BAG DECANTER FOR FLEXI CONT (MISCELLANEOUS) ×3 IMPLANT
BASKET BONE COLLECTION (BASKET) ×3 IMPLANT
BONE CANC CHIPS 20CC PCAN1/4 (Bone Implant) ×3 IMPLANT
BUR MATCHSTICK NEURO 3.0 LAGG (BURR) ×3 IMPLANT
CAGE COROENT PLIF 10X28-8 LUMB (Cage) ×4 IMPLANT
CANISTER SUCT 3000ML PPV (MISCELLANEOUS) ×3 IMPLANT
CHIPS CANC BONE 20CC PCAN1/4 (Bone Implant) ×1 IMPLANT
CONT SPEC 4OZ CLIKSEAL STRL BL (MISCELLANEOUS) ×3 IMPLANT
COVER BACK TABLE 60X90IN (DRAPES) ×3 IMPLANT
COVER WAND RF STERILE (DRAPES) ×3 IMPLANT
DERMABOND ADVANCED (GAUZE/BANDAGES/DRESSINGS) ×2
DERMABOND ADVANCED .7 DNX12 (GAUZE/BANDAGES/DRESSINGS) ×1 IMPLANT
DEVICE DISSECT PLASMABLAD 3.0S (MISCELLANEOUS) ×1 IMPLANT
DRAPE C-ARM 42X72 X-RAY (DRAPES) ×6 IMPLANT
DRAPE HALF SHEET 40X57 (DRAPES) IMPLANT
DRAPE LAPAROTOMY 100X72X124 (DRAPES) ×3 IMPLANT
DURAPREP 26ML APPLICATOR (WOUND CARE) ×3 IMPLANT
ELECT REM PT RETURN 9FT ADLT (ELECTROSURGICAL) ×3
ELECTRODE REM PT RTRN 9FT ADLT (ELECTROSURGICAL) ×1 IMPLANT
GAUZE 4X4 16PLY RFD (DISPOSABLE) IMPLANT
GAUZE SPONGE 4X4 12PLY STRL (GAUZE/BANDAGES/DRESSINGS) ×3 IMPLANT
GLOVE BIOGEL PI IND STRL 6.5 (GLOVE) IMPLANT
GLOVE BIOGEL PI IND STRL 7.0 (GLOVE) IMPLANT
GLOVE BIOGEL PI IND STRL 7.5 (GLOVE) IMPLANT
GLOVE BIOGEL PI IND STRL 8.5 (GLOVE) ×2 IMPLANT
GLOVE BIOGEL PI INDICATOR 6.5 (GLOVE) ×4
GLOVE BIOGEL PI INDICATOR 7.0 (GLOVE) ×4
GLOVE BIOGEL PI INDICATOR 7.5 (GLOVE) ×4
GLOVE BIOGEL PI INDICATOR 8.5 (GLOVE) ×4
GLOVE ECLIPSE 6.5 STRL STRAW (GLOVE) ×2 IMPLANT
GLOVE ECLIPSE 7.0 STRL STRAW (GLOVE) ×2 IMPLANT
GLOVE ECLIPSE 8.5 STRL (GLOVE) ×6 IMPLANT
GLOVE ECLIPSE 9.0 STRL (GLOVE) ×2 IMPLANT
GOWN STRL REUS W/ TWL LRG LVL3 (GOWN DISPOSABLE) IMPLANT
GOWN STRL REUS W/ TWL XL LVL3 (GOWN DISPOSABLE) IMPLANT
GOWN STRL REUS W/TWL 2XL LVL3 (GOWN DISPOSABLE) ×6 IMPLANT
GOWN STRL REUS W/TWL LRG LVL3 (GOWN DISPOSABLE) ×6
GOWN STRL REUS W/TWL XL LVL3 (GOWN DISPOSABLE) ×6
GRAFT BNE CANC CHIPS 1-8 20CC (Bone Implant) IMPLANT
HEMOSTAT POWDER KIT SURGIFOAM (HEMOSTASIS) ×4 IMPLANT
KIT BASIN OR (CUSTOM PROCEDURE TRAY) ×3 IMPLANT
KIT INFUSE SMALL (Orthopedic Implant) ×2 IMPLANT
KIT TURNOVER KIT B (KITS) ×3 IMPLANT
MILL MEDIUM DISP (BLADE) ×3 IMPLANT
NEEDLE HYPO 22GX1.5 SAFETY (NEEDLE) ×3 IMPLANT
NS IRRIG 1000ML POUR BTL (IV SOLUTION) ×3 IMPLANT
PACK LAMINECTOMY NEURO (CUSTOM PROCEDURE TRAY) ×3 IMPLANT
PAD ARMBOARD 7.5X6 YLW CONV (MISCELLANEOUS) ×9 IMPLANT
PATTIES SURGICAL .5 X1 (DISPOSABLE) ×3 IMPLANT
PLASMABLADE 3.0S (MISCELLANEOUS) ×3
ROD RELINE-O LORD 5.5X35MM (Rod) ×4 IMPLANT
SCREW LOCK RELINE 5.5 TULIP (Screw) ×8 IMPLANT
SCREW RELINE-O POLY 6.5X45 (Screw) ×8 IMPLANT
SPONGE LAP 4X18 RFD (DISPOSABLE) IMPLANT
SPONGE SURGIFOAM ABS GEL 100 (HEMOSTASIS) ×1 IMPLANT
SUT VIC AB 1 CT1 18XBRD ANBCTR (SUTURE) ×1 IMPLANT
SUT VIC AB 1 CT1 8-18 (SUTURE) ×3
SUT VIC AB 2-0 CP2 18 (SUTURE) ×3 IMPLANT
SUT VIC AB 3-0 SH 8-18 (SUTURE) ×3 IMPLANT
SYR 3ML LL SCALE MARK (SYRINGE) ×12 IMPLANT
TOWEL GREEN STERILE (TOWEL DISPOSABLE) ×3 IMPLANT
TOWEL GREEN STERILE FF (TOWEL DISPOSABLE) ×3 IMPLANT
TRAY FOLEY MTR SLVR 16FR STAT (SET/KITS/TRAYS/PACK) ×3 IMPLANT
WATER STERILE IRR 1000ML POUR (IV SOLUTION) ×3 IMPLANT

## 2019-01-03 NOTE — Anesthesia Procedure Notes (Signed)
Procedure Name: Intubation Date/Time: 01/03/2019 12:31 PM Performed by: Glynda Jaeger, CRNA Pre-anesthesia Checklist: Patient identified, Patient being monitored, Timeout performed, Emergency Drugs available and Suction available Patient Re-evaluated:Patient Re-evaluated prior to induction Oxygen Delivery Method: Circle System Utilized Preoxygenation: Pre-oxygenation with 100% oxygen Induction Type: IV induction Ventilation: Mask ventilation without difficulty Laryngoscope Size: Mac and 3 Grade View: Grade I Tube type: Oral Tube size: 7.5 mm Number of attempts: 1 Airway Equipment and Method: Stylet Placement Confirmation: ETT inserted through vocal cords under direct vision,  positive ETCO2 and breath sounds checked- equal and bilateral Secured at: 21 cm Tube secured with: Tape Dental Injury: Teeth and Oropharynx as per pre-operative assessment

## 2019-01-03 NOTE — Op Note (Signed)
Date of surgery: January 03, 2019 Preoperative diagnosis: Spondylolisthesis L4-L5 with stenosis, neurogenic claudication, lumbar radiculopathy.  Bilateral foot drops. Postoperative diagnosis: Same Procedure: Bilateral laminectomy L4-L5 with decompression of the L4 the L5 nerve roots and the common dural tube.  More work than required for simple interbody technique.  Posterior lumbar interbody arthrodesis with peek spacers local autograft allograft, infuse.  Posterior lateral arthrodesis with local autograft allograft and infuse. Surgeon: Kristeen Miss First assistant: Deri Fuelling, MD Anesthesia: General endotracheal Indications: [name] is an 85 year old individuals had significant back pain with progressive leg weakness over the last months to years.  She had developed bilateral foot drops and was noted to have a severe spondylolisthesis at L4-L5 with central and lateral recess stenosis.  She was advised regarding surgery.  Procedure: Patient was brought to the operating room supine on the stretcher.  After the smooth induction of general tracheal anesthesia she was carefully turned prone the back was prepped with alcohol DuraPrep and draped in a sterile fashion.  Midline incision was created and carried down to the lumbodorsal fascia which was opened on either side of midline to expose L4 and L5 identified positively on a radiograph.  Then large laminotomies removing the inferior marginal lamina of L4 out to and including the entirety of the facet at L4-L5 were performed.  The thickened redundant yellow ligament was taken up and hypertrophied facets were taken down to expose the common dural tube and superiorly the path of the L4 nerve root and inferiorly the path of the L5 nerve root these were decompressed using a 2 mm Kerrison punch and a high-speed drill.  The disc spaces were isolated.  Then at L4-5 bilateral discectomy was performed removing the entirety of the disc to the endplates endplates  were curettaged.  Once the endplates were prepared adequately an appropriate size spacer which in this case measured 10 mm in height 3 mm in length with 12 degrees of lordosis were placed into the interspace after being packed with autograft allograft and infuse a total of 12 cc of autograft allograft and infuse was packed into the interspace also.  Pedicle entry sites were then chosen at L4 and L5 and 6.5 x 45 mm screws were placed under fluoroscopic guidance into each of the pedicles.  A precontoured 35 mm rod was used to connect the 2 pedicle screws on either side.  Posterior lateral gutters had previously been decorticated and they were packed with an additional 6 cc of autograft allograft and infuse.  Final radiographs identified good neutral construct of L4-L5 with reduction of the spondylolisthesis and amply patent foramen at L4-5.  With this the pads of the nerve roots were checked for patency and when verified hemostasis in the soft tissues was obtained and then the lumbodorsal fascia was closed with #1 Vicryl in interrupted fashion 2-0 Vicryl was used in the subcutaneous tissues and 3-0 Vicryl subcuticularly.  Blood loss for the procedure was estimated 200 cc.

## 2019-01-03 NOTE — Progress Notes (Signed)
Vital signs are stable Patient ID: [name], female   DOB: 1930/04/17, 85 y.o.   MRN: [id number] Motor function appears intact Patient is doing well postop

## 2019-01-03 NOTE — Anesthesia Postprocedure Evaluation (Signed)
Anesthesia Post Note  Patient: [name]  Procedure(s) Performed: Lumbar Four-Five Posterior lumbar interbody fusion (N/A Spine Lumbar)     Patient location during evaluation: PACU Anesthesia Type: General Level of consciousness: awake and alert Pain management: pain level controlled Vital Signs Assessment: post-procedure vital signs reviewed and stable Respiratory status: spontaneous breathing, nonlabored ventilation, respiratory function stable and patient connected to nasal cannula oxygen Cardiovascular status: blood pressure returned to baseline and stable Postop Assessment: no apparent nausea or vomiting Anesthetic complications: no    Last Vitals:  Vitals:   01/03/19 1820 01/03/19 1939  BP: 122/71 (!) 155/72  Pulse: 92 98  Resp: 17 20  Temp: 36.4 C 37.4 C  SpO2: 99% 100%    Last Pain:  Vitals:   01/03/19 1939  TempSrc: Oral  PainSc:                  Tanairy Payeur COKER

## 2019-01-03 NOTE — Anesthesia Preprocedure Evaluation (Addendum)
Anesthesia Evaluation  Patient identified by MRN, date of birth, ID band Patient awake    Reviewed: Allergy & Precautions, NPO status , Patient's Chart, lab work & pertinent test results  Airway Mallampati: II  TM Distance: >3 FB     Dental   Pulmonary former smoker,    breath sounds clear to auscultation       Cardiovascular negative cardio ROS   Rhythm:Regular Rate:Normal     Neuro/Psych    GI/Hepatic Neg liver ROS, GERD  ,  Endo/Other  diabetes  Renal/GU      Musculoskeletal   Abdominal   Peds  Hematology   Anesthesia Other Findings   Reproductive/Obstetrics                            Anesthesia Physical Anesthesia Plan  ASA: III  Anesthesia Plan: General   Post-op Pain Management:    Induction: Intravenous  PONV Risk Score and Plan: Ondansetron, Dexamethasone, Scopolamine patch - Pre-op and Treatment may vary due to age or medical condition  Airway Management Planned: Oral ETT  Additional Equipment:   Intra-op Plan:   Post-operative Plan: Extubation in OR  Informed Consent: I have reviewed the patients History and Physical, chart, labs and discussed the procedure including the risks, benefits and alternatives for the proposed anesthesia with the patient or authorized representative who has indicated his/her understanding and acceptance.     Dental advisory given  Plan Discussed with: CRNA and Anesthesiologist  Anesthesia Plan Comments:         Anesthesia Quick Evaluation

## 2019-01-03 NOTE — H&P (Signed)
CHIEF COMPLAINT: Back and leg pain, weakness, lack of stamina on the feet.  HISTORY OF PRESENT ILLNESS: [name] is an 85 year old, right-handed lady who has been fiercely active all her life; and she notes that for the last 2 years she suffered with substantial back pain, leg pain and weakness.  She was seen and evaluated by Dr. Normajean Glasgow, and has had some steroid injections done in her back.  Ultimately, an MRI was done in November 2018, and this demonstrates a high-grade stenosis at L4-L5.  She notes that despite her best efforts, the symptoms have been getting worse; and she is seen now for surgical opinion.  She has had a previous right-sided hip arthroplasty, and her health in general has been good otherwise.  CURRENT MEDICATIONS: Include Tylenol, Aspirin, Centrum, Fish Oil, Metformin, Pravastatin, Gabapentin, Cyclobenzaprine, and Meloxicam.  She is not taking currently the Cyclobenzaprine or the Meloxicam.  SYSTEMS REVIEW: Notable for arthritis, the back pain, leg pain, joint pain and swelling on a 14-point review sheet.  PHYSICAL EXAMINATION: I note that the patient stands straight and erect, though she does tend to favor a 10-degree forward stoop.  She walks with a moderate antalgia involving either lower extremity, and there is evidence of weakness in both the gastrocs, graded at 4/5, and both the tibialis anterior, graded at 4- out of 5.  There are absent reflexes in the patellae and the Achilles both.     Today in the office, to further her workup, I obtained lateral flexion-extension films in addition to a singular AP view.  The radiographs demonstrate that there is a 9 mm spondylolisthesis at L4-5 that reduces to approximately 4 mm when she stands straight and erect.  The alignment of the rest of the spine appears to be fairly stable.  IMPRESSION: The patient has a spondylolisthesis that is mobile at L4-L5 with high-grade stenosis at that level.  I noted on her exam that she has  significant weakness in both the gastrocs and the tibialis anterior group, and it is consistent with the finding of the stenosis.  She has had symptoms that have been progressive now for a couple of years time, and ultimately I believe that this process needs to be surgically decompressed; but it will also require surgical stabilization.  This would be done with pedicle screws with augmentation between L4 and L5.  The interbody space would be held open with a spacer and a graft that would be placed at the time of surgery.  The surgery itself typically takes about 3 hours of time.  Patients are in the hospital typically about 3-4 days and may need some help with activities of daily living for about 2 weeks time; and usually an external corset is used for a period of about 8 weeks.  I noted that the typical time of recovery is about 4 months.  The patient notes that she has a very busy schedule, traveling about and active with church activities.  I do believe that given her good level of activity, it should bode well for a good recovery.  However, the surgical intervention is inevitable, and given the fact that the stenosis was this severe over a year ago, she is now experiencing the signs and symptoms of weakness, which may be reflective of some nerve damage.  This not withstanding, she should undergo surgical decompression and stabilization at L4-5.  I will work to schedule it at the earliest convenience.  I did discuss this with her daughter by  phone today, and I explained the nature of the surgery to the patient.  She is admitted for surgery.

## 2019-01-03 NOTE — Transfer of Care (Signed)
Immediate Anesthesia Transfer of Care Note  Patient: [name]  Procedure(s) Performed: Lumbar Four-Five Posterior lumbar interbody fusion (N/A Spine Lumbar)  Patient Location: PACU  Anesthesia Type:General  Level of Consciousness: awake, alert , oriented and patient cooperative  Airway & Oxygen Therapy: Patient Spontanous Breathing and Patient connected to nasal cannula oxygen  Post-op Assessment: Report given to RN and Post -op Vital signs reviewed and stable  Post vital signs: Reviewed and stable  Last Vitals:  Vitals Value Taken Time  BP 126/67 01/03/2019  3:59 PM  Temp    Pulse 80 01/03/2019  4:00 PM  Resp 13 01/03/2019  4:00 PM  SpO2 100 % 01/03/2019  4:00 PM  Vitals shown include unvalidated device data.  Last Pain:  Vitals:   01/03/19 0923  TempSrc:   PainSc: 7          Complications: No apparent anesthesia complications

## 2019-01-04 ENCOUNTER — Other Ambulatory Visit: Payer: Self-pay

## 2019-01-04 LAB — BASIC METABOLIC PANEL
Anion gap: 11 (ref 5–15)
BUN: 10 mg/dL (ref 8–23)
CHLORIDE: 103 mmol/L (ref 98–111)
CO2: 25 mmol/L (ref 22–32)
CREATININE: 0.9 mg/dL (ref 0.44–1.00)
Calcium: 8.5 mg/dL — ABNORMAL LOW (ref 8.9–10.3)
GFR calc Af Amer: >60 mL/min (ref >60)
GFR calc non Af Amer: 58 mL/min — ABNORMAL LOW (ref >60)
Glucose, Bld: 157 mg/dL — ABNORMAL HIGH (ref 70–99)
Potassium: 4 mmol/L (ref 3.5–5.1)
Sodium: 139 mmol/L (ref 135–145)

## 2019-01-04 LAB — GLUCOSE, CAPILLARY
Glucose-Capillary: 185 mg/dL — ABNORMAL HIGH (ref 70–99)
Glucose-Capillary: 270 mg/dL — ABNORMAL HIGH (ref 70–99)
Glucose-Capillary: 99 mg/dL (ref 70–99)
Glucose-Capillary: 153 mg/dL — ABNORMAL HIGH (ref 70–99)

## 2019-01-04 LAB — CBC
HEMATOCRIT: 29.1 % — AB (ref 36.0–46.0)
Hemoglobin: 9.4 g/dL — ABNORMAL LOW (ref 12.0–15.0)
MCH: 26.9 pg (ref 26.0–34.0)
MCHC: 32.3 g/dL (ref 30.0–36.0)
MCV: 83.4 fL (ref 80.0–100.0)
Platelets: 197 10*3/uL (ref 150–400)
RBC: 3.49 MIL/uL — ABNORMAL LOW (ref 3.87–5.11)
RDW: 13.7 % (ref 11.5–15.5)
WBC: 12 10*3/uL — ABNORMAL HIGH (ref 4.0–10.5)
nRBC: 0 % (ref 0.0–0.2)

## 2019-01-04 MED FILL — Gelatin Absorbable MT Powder: OROMUCOSAL | Qty: 1 | Status: AC

## 2019-01-04 MED FILL — Thrombin For Soln 5000 Unit: CUTANEOUS | Qty: 5000 | Status: AC

## 2019-01-04 NOTE — Social Work (Signed)
CSW acknowledging consult for SNF placement. Will follow for therapy recommendations.   Itzy Adler, MSW, LCSWA [facility] [phone]   

## 2019-01-04 NOTE — Evaluation (Signed)
Occupational Therapy Evaluation Patient Details Name: [name] MRN: [id number] DOB: 1930-03-09 Today's Date: 01/04/2019    History of Present Illness 85 yo female s/p bilateral laminectomy L4-5 with PLIF. PMH: type II diabetes, OA, osteopenia, R TKA, R THA, neuropathy   Clinical Impression   Patient is s/p PLIF L4-5 surgery resulting in functional limitations due to the deficits listed below (see OT problem list). Pt limited by pain and requires (A) for all adls. Pt reports living at home ALONE with no one. Pt states she has a church member that can help her PRN for a month.  Patient will benefit from skilled OT acutely to increase independence and safety with ADLS to allow discharge SNF.     Follow Up Recommendations  SNF    Equipment Recommendations  3 in 1 bedside commode;Other (comment)(RW)    Recommendations for Other Services       Precautions / Restrictions Precautions Precautions: Back Precaution Booklet Issued: Yes (comment) Required Braces or Orthoses: Spinal Brace Spinal Brace: Lumbar corset;Applied in sitting position      Mobility Bed Mobility Overal bed mobility: Needs Assistance Bed Mobility: Supine to Sit     Supine to sit: Mod assist     General bed mobility comments: pt needs (A) to roll onto L side and then (A) to elevate trunk from surface  Transfers Overall transfer level: Needs assistance   Transfers: Sit to/from Stand Sit to Stand: Min assist         General transfer comment: cues for hand placement and to power up    Balance Overall balance assessment: Needs assistance   Sitting balance-Leahy Scale: Fair     Standing balance support: Bilateral upper extremity supported Standing balance-Leahy Scale: Poor                             ADL either performed or assessed with clinical judgement   ADL Overall ADL's : Needs assistance/impaired Eating/Feeding: Set up   Grooming: Minimal assistance;Sitting   Upper  Body Bathing: Moderate assistance   Lower Body Bathing: Total assistance       Lower Body Dressing: Total assistance   Toilet Transfer: RW;Moderate assistance           Functional mobility during ADLs: Moderate assistance;Rolling walker General ADL Comments: pt limited to chair level sitting with pain.pt reports "ia m a baby when it comes to pain"     Vision         Perception     Praxis      Pertinent Vitals/Pain Pain Assessment: Faces Pain Score: 8  Pain Location: back Pain Descriptors / Indicators: Aching;Discomfort Pain Intervention(s): Premedicated before session;Repositioned;Other (comment);Heat applied(RN giving medication during session)     Hand Dominance Right   Extremity/Trunk Assessment Upper Extremity Assessment Upper Extremity Assessment: Overall WFL for tasks assessed       Cervical / Trunk Assessment Cervical / Trunk Assessment: Other exceptions;Kyphotic Cervical / Trunk Exceptions: post surgical   Communication Communication Communication: No difficulties   Cognition Arousal/Alertness: Awake/alert Behavior During Therapy: WFL for tasks assessed/performed Overall Cognitive Status: Impaired/Different from baseline Area of Impairment: Safety/judgement;Memory                     Memory: Decreased recall of precautions;Decreased short-term memory   Safety/Judgement: Decreased awareness of safety     General Comments: pt reports she has no one for when she leaving but insist on returning home  with a church friend helping PRN   General Comments       Exercises     Shoulder Instructions      Home Living Family/patient expects to be discharged to:: Private residence Living Arrangements: Alone Available Help at Discharge: Available 24 hours/day;Friend(s) Type of Home: House Home Access: Level entry     [place]: One level     Bathroom Shower/Tub: Occupational psychologist: Handicapped height     Home  Equipment: Star - single point;Walker - 2 wheels;Grab bars - tub/shower          Prior Functioning/Environment Level of Independence: Independent with assistive device(s)        Comments: cane for mobility        OT Problem List: Decreased strength;Decreased activity tolerance;Impaired balance (sitting and/or standing);Decreased safety awareness;Decreased knowledge of use of DME or AE;Decreased knowledge of precautions;Obesity;Pain;Decreased cognition      OT Treatment/Interventions: Self-care/ADL training;Therapeutic exercise;Neuromuscular education;Energy conservation;Manual therapy;DME and/or AE instruction;Modalities;Therapeutic activities;Patient/family education;Balance training    OT Goals(Current goals can be found in the care plan section) Acute Rehab OT Goals Patient Stated Goal: return home OT Goal Formulation: With patient Time For Goal Achievement: 01/18/19 Potential to Achieve Goals: Good  OT Frequency: Min 2X/week   Barriers to D/C:            Co-evaluation              AM-PAC OT "6 Clicks" Daily Activity     Outcome Measure Help from another person eating meals?: A Little Help from another person taking care of personal grooming?: A Lot Help from another person toileting, which includes using toliet, bedpan, or urinal?: A Lot Help from another person bathing (including washing, rinsing, drying)?: A Lot Help from another person to put on and taking off regular upper body clothing?: A Little Help from another person to put on and taking off regular lower body clothing?: A Lot 6 Click Score: 14   End of Session Equipment Utilized During Treatment: Gait belt;Back brace;Rolling walker Nurse Communication: Mobility status;Precautions  Activity Tolerance: Patient limited by pain Patient left: in chair;with call bell/phone within reach;with chair alarm set  OT Visit Diagnosis: Unsteadiness on feet (R26.81);Muscle weakness (generalized) (M62.81)                 Time: 1440-1500 OT Time Calculation (min): 20 min Charges:  OT General Charges $OT Visit: 1 Visit OT Evaluation $OT Eval Moderate Complexity: 1 Mod   Jeri Modena, OTR/L  Acute Rehabilitation Services Pager: [phone] Office: [phone] .   Jeri Modena 01/04/2019, 4:36 PM

## 2019-01-04 NOTE — Evaluation (Signed)
Physical Therapy Evaluation Patient Details Name: [name] MRN: [id number] DOB: 12/12/1929 Today's Date: 01/04/2019   History of Present Illness  85 yo female s/p bilateral laminectomy L4-5 with PLIF. PMH: type II diabetes, OA, osteopenia, R TKA, R THA, neuropathy  Clinical Impression  Pt up with nursing upon arrival without brace on. Pt presents with pain, decreased functional mobility and activity tolerance limiting her ability to return home safely. Pt educated on all precautions including donning/doffing brace and when she should have brace on. Pt also educated on performing daily functional activities within precautions. Pt needs continued education and reinforcement of precautions.  Pt would benefit from acute skilled therapy to improve functional mobility and independence to allow for safe return home. Pt educated on plan and agreed.     Follow Up Recommendations Home health PT    Equipment Recommendations  None recommended by PT    Recommendations for Other Services OT consult     Precautions / Restrictions Precautions Precautions: Back Precaution Booklet Issued: Yes (comment) Required Braces or Orthoses: Spinal Brace Spinal Brace: Lumbar corset;Applied in sitting position      Mobility  Bed Mobility               General bed mobility comments: standing on arrival  Transfers Overall transfer level: Needs assistance   Transfers: Sit to/from Stand Sit to Stand: Min assist         General transfer comment: cues for hand placement and posture with pt needing max cues not to bend  Ambulation/Gait Ambulation/Gait assistance: Min assist   Assistive device: Rolling walker (2 wheeled) Gait Pattern/deviations: Step-through pattern;Decreased stride length;Trunk flexed   Gait velocity interpretation: <1.8 ft/sec, indicate of risk for recurrent falls General Gait Details: cues for posture, position in RW, increased stride and encouragement to maximize  distance  Stairs            Wheelchair Mobility    Modified Rankin (Stroke Patients Only)       Balance Overall balance assessment: Needs assistance   Sitting balance-Leahy Scale: Fair     Standing balance support: Bilateral upper extremity supported Standing balance-Leahy Scale: Poor                               Pertinent Vitals/Pain Pain Assessment: 0-10 Pain Score: 6  Pain Location: back Pain Descriptors / Indicators: Aching;Discomfort Pain Intervention(s): Limited activity within patient's tolerance;Premedicated before session;Monitored during session;Repositioned    Home Living Family/patient expects to be discharged to:: Private residence Living Arrangements: Alone Available Help at Discharge: Available 24 hours/day;Friend(s) Type of Home: House Home Access: Level entry     Home Layout: One level Home Equipment: Cane - single point;Walker - 2 wheels;Grab bars - tub/shower      Prior Function Level of Independence: Independent with assistive device(s)         Comments: cane for mobility     Hand Dominance        Extremity/Trunk Assessment   Upper Extremity Assessment Upper Extremity Assessment: Overall WFL for tasks assessed    Lower Extremity Assessment Lower Extremity Assessment: Overall WFL for tasks assessed    Cervical / Trunk Assessment Cervical / Trunk Assessment: Other exceptions;Kyphotic Cervical / Trunk Exceptions: post surgical  Communication   Communication: No difficulties  Cognition Arousal/Alertness: Awake/alert Behavior During Therapy: WFL for tasks assessed/performed Overall Cognitive Status: Impaired/Different from baseline Area of Impairment: Safety/judgement;Memory  Memory: Decreased recall of precautions;Decreased short-term memory   Safety/Judgement: Decreased awareness of safety     General Comments: pt getting out of bed without brace on tech arrival immediately  prior to PT arrival      General Comments      Exercises     Assessment/Plan    PT Assessment Patient needs continued PT services  PT Problem List Decreased mobility;Decreased knowledge of use of DME;Decreased balance;Decreased cognition;Decreased knowledge of precautions;Decreased activity tolerance;Pain       PT Treatment Interventions DME instruction;Functional mobility training;Balance training;Patient/family education;Gait training;Therapeutic activities;Therapeutic exercise;Cognitive remediation    PT Goals (Current goals can be found in the Care Plan section)  Acute Rehab PT Goals Patient Stated Goal: return home PT Goal Formulation: With patient Time For Goal Achievement: 01/11/19 Potential to Achieve Goals: Good    Frequency Min 5X/week   Barriers to discharge        Co-evaluation               AM-PAC PT "6 Clicks" Mobility  Outcome Measure Help needed turning from your back to your side while in a flat bed without using bedrails?: A Little Help needed moving from lying on your back to sitting on the side of a flat bed without using bedrails?: A Little Help needed moving to and from a bed to a chair (including a wheelchair)?: A Little Help needed standing up from a chair using your arms (e.g., wheelchair or bedside chair)?: A Little Help needed to walk in hospital room?: A Little Help needed climbing 3-5 steps with a railing? : A Lot 6 Click Score: 17    End of Session Equipment Utilized During Treatment: Gait belt;Back brace Activity Tolerance: Patient tolerated treatment well Patient left: in chair;with call bell/phone within reach;with chair alarm set Nurse Communication: Mobility status PT Visit Diagnosis: Other abnormalities of gait and mobility (R26.89);Muscle weakness (generalized) (M62.81)    Time: 7096-2836 PT Time Calculation (min) (ACUTE ONLY): 31 min   Charges:   PT Evaluation $PT Eval Moderate Complexity: 1 Mod PT  Treatments $Therapeutic Activity: 8-22 mins        Phaedra Colgate, [place] [phone]   Chabeli Barsamian 01/04/2019, 9:03 AM

## 2019-01-04 NOTE — Progress Notes (Signed)
OT Cancellation Note  Patient Details Name: [name] MRN: [id number] DOB: 11-13-1929   Cancelled Treatment:    Reason Eval/Treat Not Completed: Patient declined, no reason specified(family request to let her sleep ) OT to reattempt later this afternoon  Richelle Ito, OTR/L  [place] Pager: [phone] Office: [phone] .  01/04/2019, 1:55 PM

## 2019-01-04 NOTE — Progress Notes (Signed)
Patient ID: [name], female   DOB: 11-01-1930, 85 y.o.   MRN: [id number] Vital signs are stable Motor function appears to be improving steadily Patient has mostly back pain today Incision is clean and dry Continue supportive care

## 2019-01-05 LAB — GLUCOSE, CAPILLARY
GLUCOSE-CAPILLARY: 135 mg/dL — AB (ref 70–99)
Glucose-Capillary: 149 mg/dL — ABNORMAL HIGH (ref 70–99)
Glucose-Capillary: 169 mg/dL — ABNORMAL HIGH (ref 70–99)
Glucose-Capillary: 119 mg/dL — ABNORMAL HIGH (ref 70–99)

## 2019-01-05 MED ORDER — KETOROLAC TROMETHAMINE 15 MG/ML IJ SOLN
15.0000 mg | Freq: Four times a day (QID) | INTRAMUSCULAR | Status: AC
  Administered 2019-01-05 – 2019-01-06 (×5): 15 mg via INTRAVENOUS
  Filled 2019-01-05 (×5): qty 1

## 2019-01-05 MED ORDER — MAGNESIUM HYDROXIDE 400 MG/5ML PO SUSP
30.0000 mL | Freq: Every day | ORAL | Status: DC | PRN
  Administered 2019-01-05: 30 mL via ORAL
  Filled 2019-01-05 (×2): qty 30

## 2019-01-05 NOTE — Progress Notes (Signed)
Occupational Therapy Treatment Patient Details Name: [name] MRN: [id number] DOB: 02-26-1930 Today's Date: 01/05/2019    History of present illness 85 yo female s/p bilateral laminectomy L4-5 with PLIF. PMH: type II diabetes, OA, osteopenia, R TKA, R THA, neuropathy   OT comments  Pt progressing slowly towards established OT goals. Upon arrival, pt reporting significant pain. Agreeable to OOB to chair for breakfast to change position for pain management. Pt requiring Max A for donning brace due to pain; reviewed education for donning brace. Pt performing sit<>stand with Mod A for power up into standing. Pt performing mobility in room and sitting up in recliner. Due to pt's decreased support at home and poor safety and balance, continue to recommend dc to post-acute rehab to optimize safety and decrease fall risk. Will continue to follow acutely as admitted.    Follow Up Recommendations  SNF    Equipment Recommendations  3 in 1 bedside commode;Other (comment)(RW)    Recommendations for Other Services      Precautions / Restrictions Precautions Precautions: Back Precaution Booklet Issued: Yes (comment) Precaution Comments: Pt recalling 2/3 back precautions. Requiring cues for no twisting Required Braces or Orthoses: Spinal Brace Spinal Brace: Lumbar corset;Applied in sitting position       Mobility Bed Mobility Overal bed mobility: Needs Assistance Bed Mobility: Rolling;Sidelying to Sit Rolling: Min assist Sidelying to sit: Mod assist       General bed mobility comments: Mod A to elevate trunk into sitting from sidelying  Transfers Overall transfer level: Needs assistance Equipment used: Rolling walker (2 wheeled) Transfers: Sit to/from Stand Sit to Stand: Mod assist         General transfer comment: Mod A to power up into standing. Min Guard A for safe descent    Balance Overall balance assessment: Needs assistance   Sitting balance-Leahy Scale: Fair     Standing balance support: Bilateral upper extremity supported Standing balance-Leahy Scale: Poor                             ADL either performed or assessed with clinical judgement   ADL Overall ADL's : Needs assistance/impaired                 Upper Body Dressing : Maximal assistance;Sitting Upper Body Dressing Details (indicate cue type and reason): Max A for donning brace due to pain.      Toilet Transfer: Moderate assistance;Ambulation;RW(Simulated to recliner) Toilet Transfer Details (indicate cue type and reason): Mod A for power up into standing. Pt requiring Min A for balance and safey in standing         Functional mobility during ADLs: Moderate assistance;Rolling walker;Minimal assistance General ADL Comments: Pt with significant pain and distracted by pain. Pt agreeable to therapy.      Vision       Perception     Praxis      Cognition Arousal/Alertness: Awake/alert Behavior During Therapy: WFL for tasks assessed/performed Overall Cognitive Status: Impaired/Different from baseline Area of Impairment: Safety/judgement;Memory                     Memory: Decreased recall of precautions;Decreased short-term memory   Safety/Judgement: Decreased awareness of safety     General Comments: Pt with decreased recall of precautions. Requiring increased time during session for following of cues.         Exercises     Shoulder Instructions  General Comments VSS. Pt reporting that she is waiting for her breakfast tray to arrive at 10    Pertinent Vitals/ Pain       Pain Assessment: Faces Faces Pain Scale: Hurts whole lot Pain Location: back Pain Descriptors / Indicators: Aching;Discomfort Pain Intervention(s): Monitored during session;Limited activity within patient's tolerance;Repositioned;Premedicated before session  Home Living                                          Prior Functioning/Environment               Frequency  Min 2X/week        Progress Toward Goals  OT Goals(current goals can now be found in the care plan section)  Progress towards OT goals: Progressing toward goals  Acute Rehab OT Goals Patient Stated Goal: return home OT Goal Formulation: With patient Time For Goal Achievement: 01/18/19 Potential to Achieve Goals: Good  Plan Discharge plan remains appropriate    Co-evaluation                 AM-PAC OT "6 Clicks" Daily Activity     Outcome Measure   Help from another person eating meals?: A Little Help from another person taking care of personal grooming?: A Lot Help from another person toileting, which includes using toliet, bedpan, or urinal?: A Lot Help from another person bathing (including washing, rinsing, drying)?: A Lot Help from another person to put on and taking off regular upper body clothing?: A Little Help from another person to put on and taking off regular lower body clothing?: A Lot 6 Click Score: 14    End of Session Equipment Utilized During Treatment: Back brace;Rolling walker  OT Visit Diagnosis: Unsteadiness on feet (R26.81);Muscle weakness (generalized) (M62.81)   Activity Tolerance Patient limited by pain   Patient Left in chair;with call bell/phone within reach;with chair alarm set   Nurse Communication Mobility status;Precautions        Time: 0942-1000 OT Time Calculation (min): 18 min  Charges: OT General Charges $OT Visit: 1 Visit OT Treatments $Self Care/Home Management : 8-22 mins  [place], OTR/L Acute Rehab Pager: [phone] Office: [place] 01/05/2019, 11:00 AM

## 2019-01-05 NOTE — Care Management Note (Signed)
Case Management Note  Patient Details  Name: [name] MRN: [id number] Date of Birth: January 06, 1930  Subjective/Objective:    85 yo female s/p bilateral laminectomy L4-5 with PLIF.  PTA, pt independent with assistive devices; lives at home alone.              Action/Plan: PT/OT recommending SNF for rehab at dc.  Met with pt to discuss dc planning; she adamantly refuses SNF placement.  Pt states that a church member, [name], will stay with her 24/7 for one month post discharge.  She states she does need RW for home; 3 in 1 recommended.  Pt has used [facility] in the past, and states she would like to use this agency again.  MD: please leave orders for HHPT/OT with face to face documentation for Medicare.  RN Case Manager will arrange services as ordered.  Expected Discharge Date:                  Expected Discharge Plan:  [place]  In-House Referral:     Discharge planning Services  CM Consult  Post Acute Care Choice:  Home Health Choice offered to:  Patient  DME Arranged:    DME Agency:     HH Arranged:    HH Agency:     Status of Service:     If discussed at [employer], dates discussed:    Additional Comments:  [name], RN, BSN  Trauma/Neuro ICU Case Manager [phone]

## 2019-01-05 NOTE — Progress Notes (Signed)
Patient ID: [name], female   DOB: 11-09-1929, 85 y.o.   MRN: [id number] Vital signs are stable Motor function appears to be stable also Back pain is modestly improved We will continue to ambulate today and consider for discharge tomorrow.

## 2019-01-05 NOTE — Progress Notes (Signed)
Physical Therapy Treatment Patient Details Name: [name] MRN: [id number] DOB: 09-11-1930 Today's Date: 01/05/2019    History of Present Illness 85 yo female s/p bilateral laminectomy L4-5 with PLIF. PMH: type II diabetes, OA, osteopenia, R TKA, R THA, neuropathy    PT Comments    Pt is limited by pain and weakness and was only able to walk a few feet in the room from bed to chair during my session today.  She was able to report 3/3 back precautions, but needed functional reinforcement of how to maintain them. PT will continue to follow acutely for safe mobility progression  Follow Up Recommendations  SNF     Equipment Recommendations  None recommended by PT    Recommendations for Other Services   NA     Precautions / Restrictions Precautions Precautions: Back Precaution Booklet Issued: Yes (comment) Precaution Comments: 3/3 back precautions recalled, but needed to review how to get back into bed properly Required Braces or Orthoses: Spinal Brace Spinal Brace: Lumbar corset;Applied in sitting position    Mobility  Bed Mobility Overal bed mobility: Needs Assistance Bed Mobility: Sit to Sidelying Rolling: Min assist Sidelying to sit: Mod assist     Sit to sidelying: Mod assist General bed mobility comments: Mod assist to help lift both legs back into bed going from sitting to sidelying.  Verbal cues for reverse log roll technique and sequencing of body and hands.    Transfers Overall transfer level: Needs assistance Equipment used: Rolling walker (2 wheeled) Transfers: Sit to/from Stand Sit to Stand: Mod assist         General transfer comment: Mod assist to come to standing from lower recliner chair.  Verbal cues for safe hand placement.   Ambulation/Gait Ambulation/Gait assistance: Min assist Gait Distance (Feet): 5 Feet Assistive device: Rolling walker (2 wheeled) Gait Pattern/deviations: Step-through pattern;Shuffle(bil knees flexed)     General  Gait Details: Pt only agreeable to walk a short distance back to the bed after being OOB in recliner for 2 hours.  She walks over flexed, weak knees and has difficulty straightening them even with cues.  Cues needed to stay inside of the RW.           Balance Overall balance assessment: Needs assistance Sitting-balance support: Feet supported;Bilateral upper extremity supported Sitting balance-Leahy Scale: Fair     Standing balance support: Bilateral upper extremity supported Standing balance-Leahy Scale: Poor Standing balance comment: needs external assist from PT and support from RW.                             Cognition Arousal/Alertness: Awake/alert Behavior During Therapy: WFL for tasks assessed/performed Overall Cognitive Status: Within Functional Limits for tasks assessed Area of Impairment: Safety/judgement;Memory                     Memory: Decreased recall of precautions;Decreased short-term memory   Safety/Judgement: Decreased awareness of safety     General Comments: Not specifically tested, but interactions and conversation functional.         General Comments General comments (skin integrity, edema, etc.): Pt reports she feels her heart is racing.  HR 124 after just transfer chair to bed.       Pertinent Vitals/Pain Pain Assessment: Faces Faces Pain Scale: Hurts whole lot Pain Location: back Pain Descriptors / Indicators: Aching;Discomfort Pain Intervention(s): Monitored during session;Limited activity within patient's tolerance;Repositioned  PT Goals (current goals can now be found in the care plan section) Acute Rehab PT Goals Patient Stated Goal: return home Progress towards PT goals: Progressing toward goals    Frequency    Min 5X/week      PT Plan Discharge plan needs to be updated       AM-PAC PT "6 Clicks" Mobility   Outcome Measure  Help needed turning from your back to your side while in a flat bed  without using bedrails?: A Little Help needed moving from lying on your back to sitting on the side of a flat bed without using bedrails?: A Little Help needed moving to and from a bed to a chair (including a wheelchair)?: A Little Help needed standing up from a chair using your arms (e.g., wheelchair or bedside chair)?: A Lot Help needed to walk in hospital room?: A Little Help needed climbing 3-5 steps with a railing? : A Lot 6 Click Score: 16    End of Session Equipment Utilized During Treatment: Gait belt;Back brace Activity Tolerance: Patient limited by pain Patient left: in bed;with call bell/phone within reach   PT Visit Diagnosis: Other abnormalities of gait and mobility (R26.89);Muscle weakness (generalized) (M62.81)     Time: 3532-9924 PT Time Calculation (min) (ACUTE ONLY): 9 min  Charges:  $Therapeutic Activity: 8-22 mins                    Navi Erber B. Christepher Melchior, PT, DPT  Acute Rehabilitation [phone] pager #[phone] office   01/05/2019, 1:40 PM

## 2019-01-06 LAB — GLUCOSE, CAPILLARY
Glucose-Capillary: 106 mg/dL — ABNORMAL HIGH (ref 70–99)
Glucose-Capillary: 115 mg/dL — ABNORMAL HIGH (ref 70–99)
Glucose-Capillary: 112 mg/dL — ABNORMAL HIGH (ref 70–99)
Glucose-Capillary: 183 mg/dL — ABNORMAL HIGH (ref 70–99)

## 2019-01-06 MED FILL — Heparin Sodium (Porcine) Inj 1000 Unit/ML: INTRAMUSCULAR | Qty: 30 | Status: AC

## 2019-01-06 MED FILL — Sodium Chloride IV Soln 0.9%: INTRAVENOUS | Qty: 2000 | Status: AC

## 2019-01-06 NOTE — Progress Notes (Signed)
Physical Therapy Treatment Patient Details Name: [name] MRN: [id number] DOB: Jan 05, 1930 Today's Date: 01/06/2019    History of Present Illness 85 yo female s/p bilateral laminectomy L4-5 with PLIF. PMH: type II diabetes, OA, osteopenia, R TKA, R THA, neuropathy    PT Comments    Today was the first day that this pt ambulated into the hallway with RW.  She remains min assist overall for most mobility and we started practicing getting up out of a flat bed without the railing today.  She self reports she doesn't feel ready to d/c home yet today.  She is refusing SNF for rehab.  PT will continue to follow acutely for safe mobility progression  Follow Up Recommendations  Home health PT;Supervision for mobility/OOB     Equipment Recommendations  None recommended by PT    Recommendations for Other Services   NA     Precautions / Restrictions Precautions Precautions: Back Precaution Booklet Issued: Yes (comment) Required Braces or Orthoses: Spinal Brace Spinal Brace: Lumbar corset;Applied in sitting position    Mobility  Bed Mobility Overal bed mobility: Needs Assistance Bed Mobility: Rolling;Sidelying to Sit Rolling: Supervision Sidelying to sit: Min assist       General bed mobility comments: HOB flat, no rail as pt doesn't have a hospital bed at home.  Min assist to come to sitting from side lying.   Transfers Overall transfer level: Needs assistance Equipment used: Rolling walker (2 wheeled) Transfers: Sit to/from Stand Sit to Stand: Min assist         General transfer comment: Min assist to support trunk coming off of low toilet, min assist to stablize RW to come up from higher bed.   Ambulation/Gait Ambulation/Gait assistance: Min guard Gait Distance (Feet): 50 Feet Assistive device: Rolling walker (2 wheeled) Gait Pattern/deviations: Step-through pattern;Trunk flexed   Gait velocity interpretation: <1.8 ft/sec, indicate of risk for recurrent  falls General Gait Details: Verbal cues for upright posture, still walking over flexed/soft knees, but no obvious buckling.  Today is the first day she has made it into the hallway.    Stairs Stairs: Yes       General stair comments: pt reports she has a level entry home          Balance Overall balance assessment: Needs assistance Sitting-balance support: Feet supported;No upper extremity supported Sitting balance-Leahy Scale: Good     Standing balance support: Bilateral upper extremity supported;Single extremity supported;No upper extremity supported Standing balance-Leahy Scale: Fair                              Cognition Arousal/Alertness: Awake/alert Behavior During Therapy: WFL for tasks assessed/performed Overall Cognitive Status: Within Functional Limits for tasks assessed                                           General Comments General comments (skin integrity, edema, etc.): Pt reports she doesn't feel ready to go home today.        Pertinent Vitals/Pain Pain Assessment: Faces Faces Pain Scale: Hurts even more Pain Location: back/incisional Pain Descriptors / Indicators: Aching Pain Intervention(s): Monitored during session;Limited activity within patient's tolerance;Repositioned           PT Goals (current goals can now be found in the care plan section) Acute Rehab PT Goals Patient Stated Goal:  return home and avoid SNF rehab Progress towards PT goals: Progressing toward goals    Frequency    Min 5X/week      PT Plan Discharge plan needs to be updated       AM-PAC PT "6 Clicks" Mobility   Outcome Measure  Help needed turning from your back to your side while in a flat bed without using bedrails?: None Help needed moving from lying on your back to sitting on the side of a flat bed without using bedrails?: A Little Help needed moving to and from a bed to a chair (including a wheelchair)?: A Little Help  needed standing up from a chair using your arms (e.g., wheelchair or bedside chair)?: A Little Help needed to walk in hospital room?: A Little Help needed climbing 3-5 steps with a railing? : A Little 6 Click Score: 19    End of Session Equipment Utilized During Treatment: Gait belt;Back brace Activity Tolerance: Patient limited by pain Patient left: in chair;with call bell/phone within reach;with chair alarm set   PT Visit Diagnosis: Other abnormalities of gait and mobility (R26.89);Muscle weakness (generalized) (M62.81)     Time: 2080-2233 PT Time Calculation (min) (ACUTE ONLY): 19 min  Charges:  $Gait Training: 8-22 mins                    [name], PT, DPT  Acute Rehabilitation [phone] pager #[phone] office   01/06/2019, 12:28 PM

## 2019-01-06 NOTE — Care Management Important Message (Signed)
Important Message  Patient Details  Name: [name] MRN: [id number] Date of Birth: 29-Jan-1930   Medicare Important Message Given:  Yes    Orbie Pyo 01/06/2019, 2:14 PM

## 2019-01-06 NOTE — Progress Notes (Signed)
Patient ID: [name], female   DOB: Mar 09, 1930, 85 y.o.   MRN: [id number] Vital signs are stable Motor function appears to be stable and improving Incision is clean and dry Mobilizing nicely

## 2019-01-07 LAB — GLUCOSE, CAPILLARY
Glucose-Capillary: 102 mg/dL — ABNORMAL HIGH (ref 70–99)
Glucose-Capillary: 198 mg/dL — ABNORMAL HIGH (ref 70–99)
Glucose-Capillary: 92 mg/dL (ref 70–99)

## 2019-01-07 MED ORDER — OXYCODONE-ACETAMINOPHEN 5-325 MG PO TABS: 1.0000 | ORAL_TABLET | ORAL | 0 refills | Status: DC | PRN

## 2019-01-07 MED ORDER — TIZANIDINE HCL 2 MG PO TABS: 2.0000 mg | ORAL_TABLET | Freq: Four times a day (QID) | ORAL | 3 refills | Status: DC | PRN

## 2019-01-07 NOTE — Care Management Note (Signed)
Case Management Note  Patient Details  Name: [name] MRN: [id number] Date of Birth: 12/07/1929  Subjective/Objective:    85 yo female s/p bilateral laminectomy L4-5 with PLIF.  PTA, pt independent with assistive devices; lives at home alone.              Action/Plan: PT/OT recommending SNF for rehab at dc.  Met with pt to discuss dc planning; she adamantly refuses SNF placement.  Pt states that a church member, [name], will stay with her 24/7 for one month post discharge.  She states she does need RW for home; 3 in 1 recommended.  Pt has used [facility] in the past, and states she would like to use this agency again.  MD: please leave orders for HHPT/OT with face to face documentation for Medicare.  RN Case Manager will arrange services as ordered.  Expected Discharge Date:  01/07/19               Expected Discharge Plan:  [place]  In-House Referral:     Discharge planning Services  CM Consult  Post Acute Care Choice:  Home Health Choice offered to:  Patient  DME Arranged:  Walker rolling DME Agency:  [facility]  HH Arranged:  PT, OT HH Agency:  [facility]  Status of Service:  Completed, signed off  If discussed at [place] of Stay Meetings, dates discussed:    Additional Comments:  01/07/2019 [name], RN, BSN Pt medically stable for discharge home today with friend/church member to provide 24h assist.  Referral to [facility], per pt choice, for HHPT/OT.  Referral to [facility] for RW; pt states she has BSC at home.    [name], RN, BSN  Trauma/Neuro ICU Case Manager [phone]

## 2019-01-07 NOTE — Progress Notes (Signed)
Physical Therapy Treatment Patient Details Name: [name] MRN: [id number] DOB: 23-Jun-1930 Today's Date: 01/07/2019    History of Present Illness 85 yo female s/p bilateral laminectomy L4-5 with PLIF. PMH: type II diabetes, OA, osteopenia, R TKA, R THA, neuropathy    PT Comments    Pt with limited gait distance today because we spent much of her session in the bathroom attempting to have a BM and brushing teeth/preforming self care in preparation for d/c home today.  She was getting tired of standing at the sink and her knees were getting soft, and pt had to sit down and rest.  PT will continue to follow acutely for safe mobility progression  Follow Up Recommendations  Home health PT;Supervision for mobility/OOB     Equipment Recommendations  None recommended by PT    Recommendations for Other Services OT consult     Precautions / Restrictions Precautions Precautions: Back Precaution Booklet Issued: Yes (comment) Precaution Comments: needed reminders not to lean on elbows at the sink in the bathroom (flexing forward).  Required Braces or Orthoses: Spinal Brace Spinal Brace: Lumbar corset;Applied in sitting position Restrictions Weight Bearing Restrictions: No    Mobility  Bed Mobility Overal bed mobility: Needs Assistance Bed Mobility: Rolling;Sidelying to Sit Rolling: Supervision Sidelying to sit: Supervision       General bed mobility comments: Supervision for safety, cues for sequencing.  Pt able to do this today with HOB flat and no rail, increased time and difficulty.  Transfers Overall transfer level: Needs assistance Equipment used: Rolling walker (2 wheeled) Transfers: Sit to/from Stand Sit to Stand: Min guard         General transfer comment: Min guard assist for safety.  Cues for safe hand placement.   Ambulation/Gait Ambulation/Gait assistance: Min guard Gait Distance (Feet): 20 Feet Assistive device: Rolling walker (2 wheeled) Gait  Pattern/deviations: Step-through pattern;Shuffle;Trunk flexed     General Gait Details: Verbal cues for upright posture, safe RW use.  Gait distance limited as we spent a significant amount of time in the bathroom preforming self care at the sink and her legs got too weak after ~10 min sitting and standing that she needed to sit down.         Balance Overall balance assessment: Needs assistance   Sitting balance-Leahy Scale: Good       Standing balance-Leahy Scale: Fair                 High Level Balance Comments: tolerated well with RW            Cognition Arousal/Alertness: Awake/alert Behavior During Therapy: WFL for tasks assessed/performed Overall Cognitive Status: No family/caregiver present to determine baseline cognitive functioning Area of Impairment: Memory                     Memory: Decreased short-term memory         General Comments: Pt could not remember if she has eaten lunch today.  Reports she has a BSC at home already (one delivered to her room), but could not remember the equipment rep dropping off the equipment. She also did not tell the evaluating therapist that she had a BSC (her story has changed a bit throughout her stay).          General Comments General comments (skin integrity, edema, etc.): pt ready for D/C home. pt reports that church members will be staying with her 24/7 for the next few months.  Pertinent Vitals/Pain Pain Assessment: Faces Pain Score: 3  Faces Pain Scale: Hurts even more Pain Location: back and buttocks Pain Descriptors / Indicators: Aching Pain Intervention(s): Limited activity within patient's tolerance;Monitored during session;Repositioned        PT Goals (current goals can now be found in the care plan section) Acute Rehab PT Goals Patient Stated Goal: return home and avoid SNF rehab Progress towards PT goals: Progressing toward goals    Frequency    Min 5X/week      PT Plan  Current plan remains appropriate       AM-PAC PT "6 Clicks" Mobility   Outcome Measure  Help needed turning from your back to your side while in a flat bed without using bedrails?: None Help needed moving from lying on your back to sitting on the side of a flat bed without using bedrails?: None Help needed moving to and from a bed to a chair (including a wheelchair)?: None Help needed standing up from a chair using your arms (e.g., wheelchair or bedside chair)?: A Little Help needed to walk in hospital room?: A Little Help needed climbing 3-5 steps with a railing? : A Little 6 Click Score: 21    End of Session Equipment Utilized During Treatment: Back brace Activity Tolerance: Patient limited by pain;Patient limited by fatigue Patient left: in chair;with call bell/phone within reach;with chair alarm set   PT Visit Diagnosis: Other abnormalities of gait and mobility (R26.89);Muscle weakness (generalized) (M62.81)     Time: 4098-1191 PT Time Calculation (min) (ACUTE ONLY): 25 min  Charges:  $Gait Training: 8-22 mins $Therapeutic Activity: 8-22 mins                    Tayden Duran B. Jermal Dismuke, PT, DPT  Acute Rehabilitation [phone] pager #[phone] office   01/07/2019, 2:27 PM

## 2019-01-07 NOTE — Discharge Summary (Signed)
Physician Discharge Summary  Patient ID: [name] MRN: [id number] DOB/AGE: 1930/07/13 85 y.o.  Admit date: 01/03/2019 Discharge date: 01/07/2019  Admission Diagnoses: Spondylolisthesis with stenosis and radiculopathy, neurogenic claudication  Discharge Diagnoses: Spondylolisthesis L4-5 with stenosis and radiculopathy, neurogenic claudication.   Active Problems:   Spondylolisthesis at L4-L5 level   Discharged Condition: good  Hospital Course: Underwent surgery to decompress L4-L5 and she tolerated surgery well.  She had some issues of postoperative constipation but this seems to have resolved itself.  Consults: None  Significant Diagnostic Studies: None  Treatments: surgery: Bilateral laminectomy L4-L5 with decompression of L4 and L5 nerve roots posterior lumbar interbody arthrodesis with local autograft and allograft and infuse peek spacers at L4-5 with pedicle screw fixation.  Discharge Exam: Blood pressure (!) 148/83, pulse 91, temperature 98.9 F (37.2 C), temperature source Oral, resp. rate 16, height 5\' 5"  (1.651 m), weight 84.8 kg, SpO2 100 %. Incision is clean and dry moderate amount of ecchymosis in the lower lumbar spine motor function is intact  Disposition: Discharge disposition: 01-Home or Self Care       Discharge Instructions    Call MD for:  redness, tenderness, or signs of infection (pain, swelling, redness, odor or green/yellow discharge around incision site)   Complete by:  As directed    Call MD for:  severe uncontrolled pain   Complete by:  As directed    Call MD for:  temperature >100.4   Complete by:  As directed    Diet - low sodium heart healthy   Complete by:  As directed    Discharge instructions   Complete by:  As directed    Okay to shower. Do not apply salves or appointments to incision. No heavy lifting with the upper extremities greater than 15 pounds.   Incentive spirometry RT   Complete by:  As directed    Increase activity  slowly   Complete by:  As directed      Allergies as of 01/07/2019      Reactions   Fentanyl Rash   Rash at injection site   Lipitor [atorvastatin] Other (See Comments)   tinnitus   Zocor [simvastatin] Other (See Comments)   Ear ringing      Medication List    TAKE these medications   acetaminophen 650 MG CR tablet Commonly known as:  TYLENOL Take 650 mg by mouth every 8 (eight) hours as needed for pain.   amoxicillin 500 MG capsule Commonly known as:  AMOXIL Take 2,000 mg by mouth See admin instructions. Take 4 capsules (2000 mg) by mouth 1 hour prior to dental procedures.   aspirin EC 81 MG tablet Take 81 mg by mouth daily.   cholecalciferol 1000 units tablet Commonly known as:  VITAMIN D Take 1,000 Units by mouth daily.   Fish Oil 1000 MG Caps Take 1,000 mg by mouth daily.   gabapentin 300 MG capsule Commonly known as:  NEURONTIN Take 300 mg by mouth 3 (three) times daily.   glucose blood test strip Commonly known as:  ONE TOUCH ULTRA TEST Check blood sugar no more than twice daily   metFORMIN 500 MG tablet Commonly known as:  GLUCOPHAGE Take 1 tablet (500 mg total) by mouth 2 (two) times daily with a meal.   multivitamin with minerals Tabs tablet Take 1 tablet by mouth daily.   ONE TOUCH DELICA LANCING DEV Misc To check blood sugars as directed   onetouch ultrasoft lancets Check blood sugar no more than  twice daily   oxyCODONE-acetaminophen 5-325 MG tablet Commonly known as:  PERCOCET/ROXICET Take 1-2 tablets by mouth every 3 (three) hours as needed for moderate pain or severe pain.   pravastatin 20 MG tablet Commonly known as:  PRAVACHOL Take 1 tablet (20 mg total) by mouth daily. What changed:  when to take this   tiZANidine 2 MG tablet Commonly known as:  ZANAFLEX Take 1 tablet (2 mg total) by mouth every 6 (six) hours as needed.        Signed: Blanchie Dessert Jerad Dunlap 01/07/2019, 8:01 AM

## 2019-01-07 NOTE — Progress Notes (Signed)
Occupational Therapy Treatment Patient Details Name: [name] MRN: [id number] DOB: [birth year] Today's Date: 01/07/2019    History of present illness 85 yo female s/p bilateral laminectomy L4-5 with PLIF. PMH: type II diabetes, OA, osteopenia, R TKA, R THA, neuropathy   OT comments  Pt in room ambulating with RW with supervisionA upon OTR arrival. LD dressing performed with figure 4 technique. Pt reports having reacher at home for ADLs. Pt performing transfers and Adl functional mobility with less assist via previous notes. Pt fair balance in standing with RW.  Pt reports that her church friends will be staying with her 24/7 for as long as needed. Pt would benefit from continued OT skilled services for ADL,mobility and safety in [facility] setting.     Follow Up Recommendations  Home health OT;Supervision - Intermittent    Equipment Recommendations  3 in 1 bedside commode    Recommendations for Other Services      Precautions / Restrictions Precautions Precautions: Back Precaution Booklet Issued: Yes (comment) Precaution Comments: 3/3 back precautions recalled, but needed to review how to get back into bed properly Required Braces or Orthoses: Spinal Brace Spinal Brace: Lumbar corset;Applied in sitting position Restrictions Weight Bearing Restrictions: No       Mobility Bed Mobility               General bed mobility comments: up in recliner upon arrival  Transfers Overall transfer level: Needs assistance Equipment used: Rolling walker (2 wheeled) Transfers: Sit to/from Stand Sit to Stand: Min guard              Balance Overall balance assessment: Needs assistance   Sitting balance-Leahy Scale: Good       Standing balance-Leahy Scale: Fair                 High Level Balance Comments: tolerated well with RW           ADL either performed or assessed with clinical judgement   ADL Overall ADL's : Needs assistance/impaired                 Upper Body Dressing : Set up;Sitting;Standing   Lower Body Dressing: Set up;Sit to/from stand;Sitting/lateral leans   Toilet Transfer: Supervision/safety;RW           Functional mobility during ADLs: Supervision/safety;Rolling walker General ADL Comments: Pt LB ADL with figure four technique     Vision       Perception     Praxis      Cognition Arousal/Alertness: Awake/alert Behavior During Therapy: WFL for tasks assessed/performed Overall Cognitive Status: Within Functional Limits for tasks assessed                                          Exercises     Shoulder Instructions       General Comments pt ready for D/C home. pt reports that church members will be staying with her 24/7 for the next few months.    Pertinent Vitals/ Pain       Pain Assessment: Faces Pain Score: 3  Pain Location: back/incisional Pain Descriptors / Indicators: Aching Pain Intervention(s): Monitored during session  Home Living  Prior Functioning/Environment              Frequency  Min 2X/week        Progress Toward Goals  OT Goals(current goals can now be found in the care plan section)  Progress towards OT goals: Progressing toward goals  Acute Rehab OT Goals Patient Stated Goal: return home and avoid SNF rehab OT Goal Formulation: With patient Time For Goal Achievement: 01/18/19 Potential to Achieve Goals: Good ADL Goals Pt Will Perform Grooming: with set-up;with supervision;standing Pt Will Perform Lower Body Dressing: with set-up;with supervision;with adaptive equipment;sit to/from stand Pt Will Transfer to Toilet: with set-up;with supervision;ambulating;bedside commode Pt Will Perform Toileting - Clothing Manipulation and hygiene: with set-up;with supervision;sitting/lateral leans;sit to/from stand Pt Will Perform Tub/Shower Transfer: Shower transfer;ambulating;with min guard assist;3  in 1;rolling walker  Plan Discharge plan remains appropriate    Co-evaluation                 AM-PAC OT "6 Clicks" Daily Activity     Outcome Measure   Help from another person eating meals?: None Help from another person taking care of personal grooming?: None Help from another person toileting, which includes using toliet, bedpan, or urinal?: A Little Help from another person bathing (including washing, rinsing, drying)?: A Little Help from another person to put on and taking off regular upper body clothing?: None Help from another person to put on and taking off regular lower body clothing?: A Little 6 Click Score: 21    End of Session Equipment Utilized During Treatment: Back brace;Rolling walker  OT Visit Diagnosis: Unsteadiness on feet (R26.81);Muscle weakness (generalized) (M62.81)   Activity Tolerance Patient tolerated treatment well   Patient Left in chair;with call bell/phone within reach;with chair alarm set   Nurse Communication Mobility status;Precautions        Time: 2979-8921 OT Time Calculation (min): 16 min  Charges: OT General Charges $OT Visit: 1 Visit OT Treatments $Self Care/Home Management : 8-22 mins  Darryl Nestle) Marsa Aris OTR/L Acute Rehabilitation Services Pager: [phone] Office: [phone]   Fredda Hammed 01/07/2019, 1:46 PM

## 2019-01-09 DIAGNOSIS — Z7982 Long term (current) use of aspirin: Secondary | ICD-10-CM | POA: Diagnosis not present

## 2019-01-09 DIAGNOSIS — M1611 Unilateral primary osteoarthritis, right hip: Secondary | ICD-10-CM | POA: Diagnosis not present

## 2019-01-09 DIAGNOSIS — Z981 Arthrodesis status: Secondary | ICD-10-CM | POA: Diagnosis not present

## 2019-01-09 DIAGNOSIS — Z4789 Encounter for other orthopedic aftercare: Secondary | ICD-10-CM | POA: Diagnosis not present

## 2019-01-09 DIAGNOSIS — E1140 Type 2 diabetes mellitus with diabetic neuropathy, unspecified: Secondary | ICD-10-CM | POA: Diagnosis not present

## 2019-01-09 DIAGNOSIS — M48062 Spinal stenosis, lumbar region with neurogenic claudication: Secondary | ICD-10-CM | POA: Diagnosis not present

## 2019-01-09 DIAGNOSIS — Z7984 Long term (current) use of oral hypoglycemic drugs: Secondary | ICD-10-CM | POA: Diagnosis not present

## 2019-01-09 DIAGNOSIS — M4316 Spondylolisthesis, lumbar region: Secondary | ICD-10-CM | POA: Diagnosis not present

## 2019-01-09 DIAGNOSIS — Z79891 Long term (current) use of opiate analgesic: Secondary | ICD-10-CM | POA: Diagnosis not present

## 2019-01-11 ENCOUNTER — Telehealth: Payer: Self-pay | Admitting: Internal Medicine

## 2019-01-11 NOTE — Telephone Encounter (Signed)
Spoke w/ [facility], verbal orders given.  

## 2019-01-11 NOTE — Telephone Encounter (Signed)
Copied from [place] [phone]. Topic: Quick Communication - Home Health Verbal Orders >> Jan 11, 2019  9:44 AM Margot Ables wrote: Caller/Agency: [facility] with [facility] Number: [phone], secure VM if no answer Requesting OT/PT/Skilled Nursing/Social Work/Speech Therapy: PT Frequency: requesting VO for PT 3x week for 1 week, 2x week for 4 weeks, 1x week for 2 weeks to work on gait/balance/strengthening

## 2019-01-12 DIAGNOSIS — Z981 Arthrodesis status: Secondary | ICD-10-CM | POA: Diagnosis not present

## 2019-01-12 DIAGNOSIS — Z7984 Long term (current) use of oral hypoglycemic drugs: Secondary | ICD-10-CM | POA: Diagnosis not present

## 2019-01-12 DIAGNOSIS — E1140 Type 2 diabetes mellitus with diabetic neuropathy, unspecified: Secondary | ICD-10-CM | POA: Diagnosis not present

## 2019-01-12 DIAGNOSIS — M1611 Unilateral primary osteoarthritis, right hip: Secondary | ICD-10-CM | POA: Diagnosis not present

## 2019-01-12 DIAGNOSIS — Z79891 Long term (current) use of opiate analgesic: Secondary | ICD-10-CM | POA: Diagnosis not present

## 2019-01-12 DIAGNOSIS — Z4789 Encounter for other orthopedic aftercare: Secondary | ICD-10-CM | POA: Diagnosis not present

## 2019-01-12 DIAGNOSIS — M48062 Spinal stenosis, lumbar region with neurogenic claudication: Secondary | ICD-10-CM | POA: Diagnosis not present

## 2019-01-12 DIAGNOSIS — M4316 Spondylolisthesis, lumbar region: Secondary | ICD-10-CM | POA: Diagnosis not present

## 2019-01-12 DIAGNOSIS — Z7982 Long term (current) use of aspirin: Secondary | ICD-10-CM | POA: Diagnosis not present

## 2019-01-14 DIAGNOSIS — Z7982 Long term (current) use of aspirin: Secondary | ICD-10-CM | POA: Diagnosis not present

## 2019-01-14 DIAGNOSIS — Z981 Arthrodesis status: Secondary | ICD-10-CM | POA: Diagnosis not present

## 2019-01-14 DIAGNOSIS — M1611 Unilateral primary osteoarthritis, right hip: Secondary | ICD-10-CM | POA: Diagnosis not present

## 2019-01-14 DIAGNOSIS — Z4789 Encounter for other orthopedic aftercare: Secondary | ICD-10-CM | POA: Diagnosis not present

## 2019-01-14 DIAGNOSIS — E1140 Type 2 diabetes mellitus with diabetic neuropathy, unspecified: Secondary | ICD-10-CM | POA: Diagnosis not present

## 2019-01-14 DIAGNOSIS — M4316 Spondylolisthesis, lumbar region: Secondary | ICD-10-CM | POA: Diagnosis not present

## 2019-01-14 DIAGNOSIS — Z79891 Long term (current) use of opiate analgesic: Secondary | ICD-10-CM | POA: Diagnosis not present

## 2019-01-14 DIAGNOSIS — Z7984 Long term (current) use of oral hypoglycemic drugs: Secondary | ICD-10-CM | POA: Diagnosis not present

## 2019-01-14 DIAGNOSIS — M48062 Spinal stenosis, lumbar region with neurogenic claudication: Secondary | ICD-10-CM | POA: Diagnosis not present

## 2019-01-18 DIAGNOSIS — Z7982 Long term (current) use of aspirin: Secondary | ICD-10-CM | POA: Diagnosis not present

## 2019-01-18 DIAGNOSIS — E1140 Type 2 diabetes mellitus with diabetic neuropathy, unspecified: Secondary | ICD-10-CM | POA: Diagnosis not present

## 2019-01-18 DIAGNOSIS — Z981 Arthrodesis status: Secondary | ICD-10-CM | POA: Diagnosis not present

## 2019-01-18 DIAGNOSIS — Z7984 Long term (current) use of oral hypoglycemic drugs: Secondary | ICD-10-CM | POA: Diagnosis not present

## 2019-01-18 DIAGNOSIS — M48062 Spinal stenosis, lumbar region with neurogenic claudication: Secondary | ICD-10-CM | POA: Diagnosis not present

## 2019-01-18 DIAGNOSIS — Z79891 Long term (current) use of opiate analgesic: Secondary | ICD-10-CM | POA: Diagnosis not present

## 2019-01-18 DIAGNOSIS — M4316 Spondylolisthesis, lumbar region: Secondary | ICD-10-CM | POA: Diagnosis not present

## 2019-01-18 DIAGNOSIS — M1611 Unilateral primary osteoarthritis, right hip: Secondary | ICD-10-CM | POA: Diagnosis not present

## 2019-01-18 DIAGNOSIS — Z4789 Encounter for other orthopedic aftercare: Secondary | ICD-10-CM | POA: Diagnosis not present

## 2019-01-19 DIAGNOSIS — M48062 Spinal stenosis, lumbar region with neurogenic claudication: Secondary | ICD-10-CM | POA: Diagnosis not present

## 2019-01-20 DIAGNOSIS — E1140 Type 2 diabetes mellitus with diabetic neuropathy, unspecified: Secondary | ICD-10-CM | POA: Diagnosis not present

## 2019-01-20 DIAGNOSIS — Z7984 Long term (current) use of oral hypoglycemic drugs: Secondary | ICD-10-CM | POA: Diagnosis not present

## 2019-01-20 DIAGNOSIS — Z79891 Long term (current) use of opiate analgesic: Secondary | ICD-10-CM | POA: Diagnosis not present

## 2019-01-20 DIAGNOSIS — M1611 Unilateral primary osteoarthritis, right hip: Secondary | ICD-10-CM | POA: Diagnosis not present

## 2019-01-20 DIAGNOSIS — Z4789 Encounter for other orthopedic aftercare: Secondary | ICD-10-CM | POA: Diagnosis not present

## 2019-01-20 DIAGNOSIS — M48062 Spinal stenosis, lumbar region with neurogenic claudication: Secondary | ICD-10-CM | POA: Diagnosis not present

## 2019-01-20 DIAGNOSIS — M4316 Spondylolisthesis, lumbar region: Secondary | ICD-10-CM | POA: Diagnosis not present

## 2019-01-20 DIAGNOSIS — Z981 Arthrodesis status: Secondary | ICD-10-CM | POA: Diagnosis not present

## 2019-01-20 DIAGNOSIS — Z7982 Long term (current) use of aspirin: Secondary | ICD-10-CM | POA: Diagnosis not present

## 2019-01-25 DIAGNOSIS — Z7984 Long term (current) use of oral hypoglycemic drugs: Secondary | ICD-10-CM | POA: Diagnosis not present

## 2019-01-25 DIAGNOSIS — Z79891 Long term (current) use of opiate analgesic: Secondary | ICD-10-CM | POA: Diagnosis not present

## 2019-01-25 DIAGNOSIS — Z981 Arthrodesis status: Secondary | ICD-10-CM | POA: Diagnosis not present

## 2019-01-25 DIAGNOSIS — Z4789 Encounter for other orthopedic aftercare: Secondary | ICD-10-CM | POA: Diagnosis not present

## 2019-01-25 DIAGNOSIS — M4316 Spondylolisthesis, lumbar region: Secondary | ICD-10-CM | POA: Diagnosis not present

## 2019-01-25 DIAGNOSIS — M48062 Spinal stenosis, lumbar region with neurogenic claudication: Secondary | ICD-10-CM | POA: Diagnosis not present

## 2019-01-25 DIAGNOSIS — Z7982 Long term (current) use of aspirin: Secondary | ICD-10-CM | POA: Diagnosis not present

## 2019-01-25 DIAGNOSIS — M1611 Unilateral primary osteoarthritis, right hip: Secondary | ICD-10-CM | POA: Diagnosis not present

## 2019-01-25 DIAGNOSIS — E1140 Type 2 diabetes mellitus with diabetic neuropathy, unspecified: Secondary | ICD-10-CM | POA: Diagnosis not present

## 2019-01-27 DIAGNOSIS — E1140 Type 2 diabetes mellitus with diabetic neuropathy, unspecified: Secondary | ICD-10-CM | POA: Diagnosis not present

## 2019-01-27 DIAGNOSIS — Z7982 Long term (current) use of aspirin: Secondary | ICD-10-CM | POA: Diagnosis not present

## 2019-01-27 DIAGNOSIS — Z79891 Long term (current) use of opiate analgesic: Secondary | ICD-10-CM | POA: Diagnosis not present

## 2019-01-27 DIAGNOSIS — M4316 Spondylolisthesis, lumbar region: Secondary | ICD-10-CM | POA: Diagnosis not present

## 2019-01-27 DIAGNOSIS — Z981 Arthrodesis status: Secondary | ICD-10-CM | POA: Diagnosis not present

## 2019-01-27 DIAGNOSIS — M1611 Unilateral primary osteoarthritis, right hip: Secondary | ICD-10-CM | POA: Diagnosis not present

## 2019-01-27 DIAGNOSIS — Z4789 Encounter for other orthopedic aftercare: Secondary | ICD-10-CM | POA: Diagnosis not present

## 2019-01-27 DIAGNOSIS — M48062 Spinal stenosis, lumbar region with neurogenic claudication: Secondary | ICD-10-CM | POA: Diagnosis not present

## 2019-01-27 DIAGNOSIS — Z7984 Long term (current) use of oral hypoglycemic drugs: Secondary | ICD-10-CM | POA: Diagnosis not present

## 2019-02-01 ENCOUNTER — Other Ambulatory Visit: Payer: Self-pay | Admitting: Internal Medicine

## 2019-02-01 DIAGNOSIS — M4316 Spondylolisthesis, lumbar region: Secondary | ICD-10-CM | POA: Diagnosis not present

## 2019-02-01 DIAGNOSIS — Z79891 Long term (current) use of opiate analgesic: Secondary | ICD-10-CM | POA: Diagnosis not present

## 2019-02-01 DIAGNOSIS — Z981 Arthrodesis status: Secondary | ICD-10-CM | POA: Diagnosis not present

## 2019-02-01 DIAGNOSIS — Z4789 Encounter for other orthopedic aftercare: Secondary | ICD-10-CM | POA: Diagnosis not present

## 2019-02-01 DIAGNOSIS — E1140 Type 2 diabetes mellitus with diabetic neuropathy, unspecified: Secondary | ICD-10-CM | POA: Diagnosis not present

## 2019-02-01 DIAGNOSIS — Z7984 Long term (current) use of oral hypoglycemic drugs: Secondary | ICD-10-CM | POA: Diagnosis not present

## 2019-02-01 DIAGNOSIS — M1611 Unilateral primary osteoarthritis, right hip: Secondary | ICD-10-CM | POA: Diagnosis not present

## 2019-02-01 DIAGNOSIS — M48062 Spinal stenosis, lumbar region with neurogenic claudication: Secondary | ICD-10-CM | POA: Diagnosis not present

## 2019-02-01 DIAGNOSIS — Z7982 Long term (current) use of aspirin: Secondary | ICD-10-CM | POA: Diagnosis not present

## 2019-02-03 DIAGNOSIS — M48062 Spinal stenosis, lumbar region with neurogenic claudication: Secondary | ICD-10-CM | POA: Diagnosis not present

## 2019-02-03 DIAGNOSIS — Z981 Arthrodesis status: Secondary | ICD-10-CM | POA: Diagnosis not present

## 2019-02-03 DIAGNOSIS — Z7984 Long term (current) use of oral hypoglycemic drugs: Secondary | ICD-10-CM | POA: Diagnosis not present

## 2019-02-03 DIAGNOSIS — Z7982 Long term (current) use of aspirin: Secondary | ICD-10-CM | POA: Diagnosis not present

## 2019-02-03 DIAGNOSIS — M1611 Unilateral primary osteoarthritis, right hip: Secondary | ICD-10-CM | POA: Diagnosis not present

## 2019-02-03 DIAGNOSIS — M4316 Spondylolisthesis, lumbar region: Secondary | ICD-10-CM | POA: Diagnosis not present

## 2019-02-03 DIAGNOSIS — E1140 Type 2 diabetes mellitus with diabetic neuropathy, unspecified: Secondary | ICD-10-CM | POA: Diagnosis not present

## 2019-02-03 DIAGNOSIS — Z79891 Long term (current) use of opiate analgesic: Secondary | ICD-10-CM | POA: Diagnosis not present

## 2019-02-03 DIAGNOSIS — Z4789 Encounter for other orthopedic aftercare: Secondary | ICD-10-CM | POA: Diagnosis not present

## 2019-02-08 DIAGNOSIS — Z79891 Long term (current) use of opiate analgesic: Secondary | ICD-10-CM | POA: Diagnosis not present

## 2019-02-08 DIAGNOSIS — Z4789 Encounter for other orthopedic aftercare: Secondary | ICD-10-CM | POA: Diagnosis not present

## 2019-02-08 DIAGNOSIS — M4316 Spondylolisthesis, lumbar region: Secondary | ICD-10-CM | POA: Diagnosis not present

## 2019-02-08 DIAGNOSIS — M48062 Spinal stenosis, lumbar region with neurogenic claudication: Secondary | ICD-10-CM | POA: Diagnosis not present

## 2019-02-08 DIAGNOSIS — Z7984 Long term (current) use of oral hypoglycemic drugs: Secondary | ICD-10-CM | POA: Diagnosis not present

## 2019-02-08 DIAGNOSIS — Z7982 Long term (current) use of aspirin: Secondary | ICD-10-CM | POA: Diagnosis not present

## 2019-02-08 DIAGNOSIS — Z981 Arthrodesis status: Secondary | ICD-10-CM | POA: Diagnosis not present

## 2019-02-08 DIAGNOSIS — E1140 Type 2 diabetes mellitus with diabetic neuropathy, unspecified: Secondary | ICD-10-CM | POA: Diagnosis not present

## 2019-02-08 DIAGNOSIS — M1611 Unilateral primary osteoarthritis, right hip: Secondary | ICD-10-CM | POA: Diagnosis not present

## 2019-02-10 DIAGNOSIS — M48062 Spinal stenosis, lumbar region with neurogenic claudication: Secondary | ICD-10-CM | POA: Diagnosis not present

## 2019-02-10 DIAGNOSIS — M1611 Unilateral primary osteoarthritis, right hip: Secondary | ICD-10-CM | POA: Diagnosis not present

## 2019-02-10 DIAGNOSIS — Z7982 Long term (current) use of aspirin: Secondary | ICD-10-CM | POA: Diagnosis not present

## 2019-02-10 DIAGNOSIS — E1140 Type 2 diabetes mellitus with diabetic neuropathy, unspecified: Secondary | ICD-10-CM | POA: Diagnosis not present

## 2019-02-10 DIAGNOSIS — Z4789 Encounter for other orthopedic aftercare: Secondary | ICD-10-CM | POA: Diagnosis not present

## 2019-02-10 DIAGNOSIS — Z981 Arthrodesis status: Secondary | ICD-10-CM | POA: Diagnosis not present

## 2019-02-10 DIAGNOSIS — Z7984 Long term (current) use of oral hypoglycemic drugs: Secondary | ICD-10-CM | POA: Diagnosis not present

## 2019-02-10 DIAGNOSIS — M4316 Spondylolisthesis, lumbar region: Secondary | ICD-10-CM | POA: Diagnosis not present

## 2019-02-10 DIAGNOSIS — Z79891 Long term (current) use of opiate analgesic: Secondary | ICD-10-CM | POA: Diagnosis not present

## 2019-02-16 DIAGNOSIS — Z7984 Long term (current) use of oral hypoglycemic drugs: Secondary | ICD-10-CM | POA: Diagnosis not present

## 2019-02-16 DIAGNOSIS — Z4789 Encounter for other orthopedic aftercare: Secondary | ICD-10-CM | POA: Diagnosis not present

## 2019-02-16 DIAGNOSIS — E1140 Type 2 diabetes mellitus with diabetic neuropathy, unspecified: Secondary | ICD-10-CM | POA: Diagnosis not present

## 2019-02-16 DIAGNOSIS — M1611 Unilateral primary osteoarthritis, right hip: Secondary | ICD-10-CM | POA: Diagnosis not present

## 2019-02-16 DIAGNOSIS — Z79891 Long term (current) use of opiate analgesic: Secondary | ICD-10-CM | POA: Diagnosis not present

## 2019-02-16 DIAGNOSIS — Z981 Arthrodesis status: Secondary | ICD-10-CM | POA: Diagnosis not present

## 2019-02-16 DIAGNOSIS — M48062 Spinal stenosis, lumbar region with neurogenic claudication: Secondary | ICD-10-CM | POA: Diagnosis not present

## 2019-02-16 DIAGNOSIS — M4316 Spondylolisthesis, lumbar region: Secondary | ICD-10-CM | POA: Diagnosis not present

## 2019-02-16 DIAGNOSIS — Z7982 Long term (current) use of aspirin: Secondary | ICD-10-CM | POA: Diagnosis not present

## 2019-02-22 DIAGNOSIS — M48062 Spinal stenosis, lumbar region with neurogenic claudication: Secondary | ICD-10-CM | POA: Diagnosis not present

## 2019-02-22 DIAGNOSIS — Z7982 Long term (current) use of aspirin: Secondary | ICD-10-CM | POA: Diagnosis not present

## 2019-02-22 DIAGNOSIS — E1140 Type 2 diabetes mellitus with diabetic neuropathy, unspecified: Secondary | ICD-10-CM | POA: Diagnosis not present

## 2019-02-22 DIAGNOSIS — Z4789 Encounter for other orthopedic aftercare: Secondary | ICD-10-CM | POA: Diagnosis not present

## 2019-02-22 DIAGNOSIS — Z79891 Long term (current) use of opiate analgesic: Secondary | ICD-10-CM | POA: Diagnosis not present

## 2019-02-22 DIAGNOSIS — Z981 Arthrodesis status: Secondary | ICD-10-CM | POA: Diagnosis not present

## 2019-02-22 DIAGNOSIS — Z7984 Long term (current) use of oral hypoglycemic drugs: Secondary | ICD-10-CM | POA: Diagnosis not present

## 2019-02-22 DIAGNOSIS — M1611 Unilateral primary osteoarthritis, right hip: Secondary | ICD-10-CM | POA: Diagnosis not present

## 2019-02-22 DIAGNOSIS — M4316 Spondylolisthesis, lumbar region: Secondary | ICD-10-CM | POA: Diagnosis not present

## 2019-02-24 DIAGNOSIS — M4316 Spondylolisthesis, lumbar region: Secondary | ICD-10-CM | POA: Diagnosis not present

## 2019-02-24 DIAGNOSIS — Z4789 Encounter for other orthopedic aftercare: Secondary | ICD-10-CM | POA: Diagnosis not present

## 2019-02-24 DIAGNOSIS — M48062 Spinal stenosis, lumbar region with neurogenic claudication: Secondary | ICD-10-CM | POA: Diagnosis not present

## 2019-02-24 DIAGNOSIS — Z79891 Long term (current) use of opiate analgesic: Secondary | ICD-10-CM | POA: Diagnosis not present

## 2019-02-24 DIAGNOSIS — M1611 Unilateral primary osteoarthritis, right hip: Secondary | ICD-10-CM | POA: Diagnosis not present

## 2019-02-24 DIAGNOSIS — Z981 Arthrodesis status: Secondary | ICD-10-CM | POA: Diagnosis not present

## 2019-02-24 DIAGNOSIS — E1140 Type 2 diabetes mellitus with diabetic neuropathy, unspecified: Secondary | ICD-10-CM | POA: Diagnosis not present

## 2019-02-24 DIAGNOSIS — Z7984 Long term (current) use of oral hypoglycemic drugs: Secondary | ICD-10-CM | POA: Diagnosis not present

## 2019-02-24 DIAGNOSIS — Z7982 Long term (current) use of aspirin: Secondary | ICD-10-CM | POA: Diagnosis not present

## 2019-03-01 DIAGNOSIS — Z79891 Long term (current) use of opiate analgesic: Secondary | ICD-10-CM | POA: Diagnosis not present

## 2019-03-01 DIAGNOSIS — M48062 Spinal stenosis, lumbar region with neurogenic claudication: Secondary | ICD-10-CM | POA: Diagnosis not present

## 2019-03-01 DIAGNOSIS — Z7982 Long term (current) use of aspirin: Secondary | ICD-10-CM | POA: Diagnosis not present

## 2019-03-01 DIAGNOSIS — M1611 Unilateral primary osteoarthritis, right hip: Secondary | ICD-10-CM | POA: Diagnosis not present

## 2019-03-01 DIAGNOSIS — Z4789 Encounter for other orthopedic aftercare: Secondary | ICD-10-CM | POA: Diagnosis not present

## 2019-03-01 DIAGNOSIS — M4316 Spondylolisthesis, lumbar region: Secondary | ICD-10-CM | POA: Diagnosis not present

## 2019-03-01 DIAGNOSIS — E1140 Type 2 diabetes mellitus with diabetic neuropathy, unspecified: Secondary | ICD-10-CM | POA: Diagnosis not present

## 2019-03-01 DIAGNOSIS — Z7984 Long term (current) use of oral hypoglycemic drugs: Secondary | ICD-10-CM | POA: Diagnosis not present

## 2019-03-01 DIAGNOSIS — Z981 Arthrodesis status: Secondary | ICD-10-CM | POA: Diagnosis not present

## 2019-03-04 ENCOUNTER — Telehealth: Payer: Self-pay

## 2019-03-04 DIAGNOSIS — M4316 Spondylolisthesis, lumbar region: Secondary | ICD-10-CM | POA: Diagnosis not present

## 2019-03-04 DIAGNOSIS — Z981 Arthrodesis status: Secondary | ICD-10-CM | POA: Diagnosis not present

## 2019-03-04 DIAGNOSIS — M48062 Spinal stenosis, lumbar region with neurogenic claudication: Secondary | ICD-10-CM | POA: Diagnosis not present

## 2019-03-04 DIAGNOSIS — E1140 Type 2 diabetes mellitus with diabetic neuropathy, unspecified: Secondary | ICD-10-CM | POA: Diagnosis not present

## 2019-03-04 DIAGNOSIS — M1611 Unilateral primary osteoarthritis, right hip: Secondary | ICD-10-CM | POA: Diagnosis not present

## 2019-03-04 DIAGNOSIS — Z4789 Encounter for other orthopedic aftercare: Secondary | ICD-10-CM | POA: Diagnosis not present

## 2019-03-04 DIAGNOSIS — Z79891 Long term (current) use of opiate analgesic: Secondary | ICD-10-CM | POA: Diagnosis not present

## 2019-03-04 DIAGNOSIS — Z7984 Long term (current) use of oral hypoglycemic drugs: Secondary | ICD-10-CM | POA: Diagnosis not present

## 2019-03-04 DIAGNOSIS — Z7982 Long term (current) use of aspirin: Secondary | ICD-10-CM | POA: Diagnosis not present

## 2019-03-04 MED ORDER — GLUCOSE BLOOD VI STRP: ORAL_STRIP | 12 refills | Status: AC

## 2019-03-04 MED ORDER — ONETOUCH ULTRA 2 W/DEVICE KIT: PACK | 0 refills | Status: DC

## 2019-03-04 NOTE — Telephone Encounter (Signed)
Copied from [place] [phone]. Topic: General - Other >> Mar 04, 2019  2:57 PM Rainey Pines A wrote: Reason for CRM: Patient stated that she needs a callback from Dr. Larose Kells nurse. Patient needs new glucose strips and device, her current one has given out.

## 2019-03-04 NOTE — Telephone Encounter (Signed)
New glucometer Rx sent to pharmacy

## 2019-03-08 DIAGNOSIS — Z4789 Encounter for other orthopedic aftercare: Secondary | ICD-10-CM | POA: Diagnosis not present

## 2019-03-08 DIAGNOSIS — Z79891 Long term (current) use of opiate analgesic: Secondary | ICD-10-CM | POA: Diagnosis not present

## 2019-03-08 DIAGNOSIS — M4316 Spondylolisthesis, lumbar region: Secondary | ICD-10-CM | POA: Diagnosis not present

## 2019-03-08 DIAGNOSIS — Z981 Arthrodesis status: Secondary | ICD-10-CM | POA: Diagnosis not present

## 2019-03-08 DIAGNOSIS — Z7984 Long term (current) use of oral hypoglycemic drugs: Secondary | ICD-10-CM | POA: Diagnosis not present

## 2019-03-08 DIAGNOSIS — E1140 Type 2 diabetes mellitus with diabetic neuropathy, unspecified: Secondary | ICD-10-CM | POA: Diagnosis not present

## 2019-03-08 DIAGNOSIS — M1611 Unilateral primary osteoarthritis, right hip: Secondary | ICD-10-CM | POA: Diagnosis not present

## 2019-03-08 DIAGNOSIS — Z7982 Long term (current) use of aspirin: Secondary | ICD-10-CM | POA: Diagnosis not present

## 2019-03-08 DIAGNOSIS — M48062 Spinal stenosis, lumbar region with neurogenic claudication: Secondary | ICD-10-CM | POA: Diagnosis not present

## 2019-03-09 DIAGNOSIS — Z79891 Long term (current) use of opiate analgesic: Secondary | ICD-10-CM | POA: Diagnosis not present

## 2019-03-09 DIAGNOSIS — M4316 Spondylolisthesis, lumbar region: Secondary | ICD-10-CM | POA: Diagnosis not present

## 2019-03-09 DIAGNOSIS — E1140 Type 2 diabetes mellitus with diabetic neuropathy, unspecified: Secondary | ICD-10-CM | POA: Diagnosis not present

## 2019-03-09 DIAGNOSIS — Z4789 Encounter for other orthopedic aftercare: Secondary | ICD-10-CM | POA: Diagnosis not present

## 2019-03-09 DIAGNOSIS — M1611 Unilateral primary osteoarthritis, right hip: Secondary | ICD-10-CM | POA: Diagnosis not present

## 2019-03-09 DIAGNOSIS — M48062 Spinal stenosis, lumbar region with neurogenic claudication: Secondary | ICD-10-CM | POA: Diagnosis not present

## 2019-03-09 DIAGNOSIS — Z7982 Long term (current) use of aspirin: Secondary | ICD-10-CM | POA: Diagnosis not present

## 2019-03-09 DIAGNOSIS — Z7984 Long term (current) use of oral hypoglycemic drugs: Secondary | ICD-10-CM | POA: Diagnosis not present

## 2019-03-09 DIAGNOSIS — Z981 Arthrodesis status: Secondary | ICD-10-CM | POA: Diagnosis not present

## 2019-03-16 DIAGNOSIS — M48062 Spinal stenosis, lumbar region with neurogenic claudication: Secondary | ICD-10-CM | POA: Diagnosis not present

## 2019-03-16 DIAGNOSIS — Z981 Arthrodesis status: Secondary | ICD-10-CM | POA: Diagnosis not present

## 2019-03-16 DIAGNOSIS — Z4789 Encounter for other orthopedic aftercare: Secondary | ICD-10-CM | POA: Diagnosis not present

## 2019-03-16 DIAGNOSIS — Z79891 Long term (current) use of opiate analgesic: Secondary | ICD-10-CM | POA: Diagnosis not present

## 2019-03-16 DIAGNOSIS — E1140 Type 2 diabetes mellitus with diabetic neuropathy, unspecified: Secondary | ICD-10-CM | POA: Diagnosis not present

## 2019-03-16 DIAGNOSIS — Z7984 Long term (current) use of oral hypoglycemic drugs: Secondary | ICD-10-CM | POA: Diagnosis not present

## 2019-03-16 DIAGNOSIS — M1611 Unilateral primary osteoarthritis, right hip: Secondary | ICD-10-CM | POA: Diagnosis not present

## 2019-03-16 DIAGNOSIS — Z7982 Long term (current) use of aspirin: Secondary | ICD-10-CM | POA: Diagnosis not present

## 2019-03-16 DIAGNOSIS — M4316 Spondylolisthesis, lumbar region: Secondary | ICD-10-CM | POA: Diagnosis not present

## 2019-03-18 DIAGNOSIS — Z79891 Long term (current) use of opiate analgesic: Secondary | ICD-10-CM | POA: Diagnosis not present

## 2019-03-18 DIAGNOSIS — Z7982 Long term (current) use of aspirin: Secondary | ICD-10-CM | POA: Diagnosis not present

## 2019-03-18 DIAGNOSIS — M48062 Spinal stenosis, lumbar region with neurogenic claudication: Secondary | ICD-10-CM | POA: Diagnosis not present

## 2019-03-18 DIAGNOSIS — Z4789 Encounter for other orthopedic aftercare: Secondary | ICD-10-CM | POA: Diagnosis not present

## 2019-03-18 DIAGNOSIS — M1611 Unilateral primary osteoarthritis, right hip: Secondary | ICD-10-CM | POA: Diagnosis not present

## 2019-03-18 DIAGNOSIS — Z981 Arthrodesis status: Secondary | ICD-10-CM | POA: Diagnosis not present

## 2019-03-18 DIAGNOSIS — E1140 Type 2 diabetes mellitus with diabetic neuropathy, unspecified: Secondary | ICD-10-CM | POA: Diagnosis not present

## 2019-03-18 DIAGNOSIS — M4316 Spondylolisthesis, lumbar region: Secondary | ICD-10-CM | POA: Diagnosis not present

## 2019-03-18 DIAGNOSIS — Z7984 Long term (current) use of oral hypoglycemic drugs: Secondary | ICD-10-CM | POA: Diagnosis not present

## 2019-03-18 MED ORDER — ONETOUCH ULTRASOFT LANCETS MISC: 12 refills | Status: DC

## 2019-03-18 NOTE — Telephone Encounter (Signed)
Rx sent for lancets.  

## 2019-03-18 NOTE — Addendum Note (Signed)
Addended byDamita Dunnings D on: 03/18/2019 02:26 PM   Modules accepted: Orders

## 2019-03-18 NOTE — Telephone Encounter (Signed)
Pt needs lancets also, please send to [facility] [address]

## 2019-03-22 DIAGNOSIS — Z4789 Encounter for other orthopedic aftercare: Secondary | ICD-10-CM | POA: Diagnosis not present

## 2019-03-22 DIAGNOSIS — M4316 Spondylolisthesis, lumbar region: Secondary | ICD-10-CM | POA: Diagnosis not present

## 2019-03-22 DIAGNOSIS — Z79891 Long term (current) use of opiate analgesic: Secondary | ICD-10-CM | POA: Diagnosis not present

## 2019-03-22 DIAGNOSIS — Z7982 Long term (current) use of aspirin: Secondary | ICD-10-CM | POA: Diagnosis not present

## 2019-03-22 DIAGNOSIS — Z7984 Long term (current) use of oral hypoglycemic drugs: Secondary | ICD-10-CM | POA: Diagnosis not present

## 2019-03-22 DIAGNOSIS — M48062 Spinal stenosis, lumbar region with neurogenic claudication: Secondary | ICD-10-CM | POA: Diagnosis not present

## 2019-03-22 DIAGNOSIS — Z981 Arthrodesis status: Secondary | ICD-10-CM | POA: Diagnosis not present

## 2019-03-22 DIAGNOSIS — E1140 Type 2 diabetes mellitus with diabetic neuropathy, unspecified: Secondary | ICD-10-CM | POA: Diagnosis not present

## 2019-03-22 DIAGNOSIS — M1611 Unilateral primary osteoarthritis, right hip: Secondary | ICD-10-CM | POA: Diagnosis not present

## 2019-03-24 DIAGNOSIS — Z7984 Long term (current) use of oral hypoglycemic drugs: Secondary | ICD-10-CM | POA: Diagnosis not present

## 2019-03-24 DIAGNOSIS — Z4789 Encounter for other orthopedic aftercare: Secondary | ICD-10-CM | POA: Diagnosis not present

## 2019-03-24 DIAGNOSIS — M4316 Spondylolisthesis, lumbar region: Secondary | ICD-10-CM | POA: Diagnosis not present

## 2019-03-24 DIAGNOSIS — E1140 Type 2 diabetes mellitus with diabetic neuropathy, unspecified: Secondary | ICD-10-CM | POA: Diagnosis not present

## 2019-03-24 DIAGNOSIS — Z7982 Long term (current) use of aspirin: Secondary | ICD-10-CM | POA: Diagnosis not present

## 2019-03-24 DIAGNOSIS — Z981 Arthrodesis status: Secondary | ICD-10-CM | POA: Diagnosis not present

## 2019-03-24 DIAGNOSIS — Z79891 Long term (current) use of opiate analgesic: Secondary | ICD-10-CM | POA: Diagnosis not present

## 2019-03-24 DIAGNOSIS — M48062 Spinal stenosis, lumbar region with neurogenic claudication: Secondary | ICD-10-CM | POA: Diagnosis not present

## 2019-03-24 DIAGNOSIS — M1611 Unilateral primary osteoarthritis, right hip: Secondary | ICD-10-CM | POA: Diagnosis not present

## 2019-03-30 DIAGNOSIS — M48062 Spinal stenosis, lumbar region with neurogenic claudication: Secondary | ICD-10-CM | POA: Diagnosis not present

## 2019-04-05 DIAGNOSIS — Z981 Arthrodesis status: Secondary | ICD-10-CM | POA: Diagnosis not present

## 2019-04-05 DIAGNOSIS — M4316 Spondylolisthesis, lumbar region: Secondary | ICD-10-CM | POA: Diagnosis not present

## 2019-04-05 DIAGNOSIS — Z79891 Long term (current) use of opiate analgesic: Secondary | ICD-10-CM | POA: Diagnosis not present

## 2019-04-05 DIAGNOSIS — Z7982 Long term (current) use of aspirin: Secondary | ICD-10-CM | POA: Diagnosis not present

## 2019-04-05 DIAGNOSIS — E1140 Type 2 diabetes mellitus with diabetic neuropathy, unspecified: Secondary | ICD-10-CM | POA: Diagnosis not present

## 2019-04-05 DIAGNOSIS — M1611 Unilateral primary osteoarthritis, right hip: Secondary | ICD-10-CM | POA: Diagnosis not present

## 2019-04-05 DIAGNOSIS — M48062 Spinal stenosis, lumbar region with neurogenic claudication: Secondary | ICD-10-CM | POA: Diagnosis not present

## 2019-04-05 DIAGNOSIS — Z7984 Long term (current) use of oral hypoglycemic drugs: Secondary | ICD-10-CM | POA: Diagnosis not present

## 2019-04-05 DIAGNOSIS — Z4789 Encounter for other orthopedic aftercare: Secondary | ICD-10-CM | POA: Diagnosis not present

## 2019-04-07 DIAGNOSIS — E1140 Type 2 diabetes mellitus with diabetic neuropathy, unspecified: Secondary | ICD-10-CM | POA: Diagnosis not present

## 2019-04-07 DIAGNOSIS — Z79891 Long term (current) use of opiate analgesic: Secondary | ICD-10-CM | POA: Diagnosis not present

## 2019-04-07 DIAGNOSIS — Z7984 Long term (current) use of oral hypoglycemic drugs: Secondary | ICD-10-CM | POA: Diagnosis not present

## 2019-04-07 DIAGNOSIS — Z981 Arthrodesis status: Secondary | ICD-10-CM | POA: Diagnosis not present

## 2019-04-07 DIAGNOSIS — M1611 Unilateral primary osteoarthritis, right hip: Secondary | ICD-10-CM | POA: Diagnosis not present

## 2019-04-07 DIAGNOSIS — M4316 Spondylolisthesis, lumbar region: Secondary | ICD-10-CM | POA: Diagnosis not present

## 2019-04-07 DIAGNOSIS — Z4789 Encounter for other orthopedic aftercare: Secondary | ICD-10-CM | POA: Diagnosis not present

## 2019-04-07 DIAGNOSIS — Z7982 Long term (current) use of aspirin: Secondary | ICD-10-CM | POA: Diagnosis not present

## 2019-04-07 DIAGNOSIS — M48062 Spinal stenosis, lumbar region with neurogenic claudication: Secondary | ICD-10-CM | POA: Diagnosis not present

## 2019-04-27 DIAGNOSIS — M79642 Pain in left hand: Secondary | ICD-10-CM | POA: Diagnosis not present

## 2019-05-19 ENCOUNTER — Emergency Department (HOSPITAL_BASED_OUTPATIENT_CLINIC_OR_DEPARTMENT_OTHER)
Admission: EM | Admit: 2019-05-19 | Discharge: 2019-05-19 | Disposition: A | Discharge status: Home / Self Care | Payer: Medicare Other | Attending: Emergency Medicine | Admitting: Emergency Medicine

## 2019-05-19 ENCOUNTER — Encounter (HOSPITAL_BASED_OUTPATIENT_CLINIC_OR_DEPARTMENT_OTHER): Payer: Self-pay | Admitting: Emergency Medicine

## 2019-05-19 ENCOUNTER — Emergency Department (HOSPITAL_BASED_OUTPATIENT_CLINIC_OR_DEPARTMENT_OTHER): Payer: Medicare Other

## 2019-05-19 ENCOUNTER — Other Ambulatory Visit: Payer: Self-pay

## 2019-05-19 DIAGNOSIS — Z7982 Long term (current) use of aspirin: Secondary | ICD-10-CM | POA: Insufficient documentation

## 2019-05-19 DIAGNOSIS — K5730 Diverticulosis of large intestine without perforation or abscess without bleeding: Secondary | ICD-10-CM | POA: Diagnosis not present

## 2019-05-19 DIAGNOSIS — Z96651 Presence of right artificial knee joint: Secondary | ICD-10-CM | POA: Insufficient documentation

## 2019-05-19 DIAGNOSIS — M545 Low back pain: Secondary | ICD-10-CM | POA: Diagnosis present

## 2019-05-19 DIAGNOSIS — Z7984 Long term (current) use of oral hypoglycemic drugs: Secondary | ICD-10-CM | POA: Diagnosis not present

## 2019-05-19 DIAGNOSIS — M5442 Lumbago with sciatica, left side: Secondary | ICD-10-CM | POA: Diagnosis not present

## 2019-05-19 DIAGNOSIS — K5900 Constipation, unspecified: Secondary | ICD-10-CM | POA: Diagnosis not present

## 2019-05-19 DIAGNOSIS — E119 Type 2 diabetes mellitus without complications: Secondary | ICD-10-CM | POA: Insufficient documentation

## 2019-05-19 DIAGNOSIS — Z96641 Presence of right artificial hip joint: Secondary | ICD-10-CM | POA: Diagnosis not present

## 2019-05-19 DIAGNOSIS — Z79899 Other long term (current) drug therapy: Secondary | ICD-10-CM | POA: Diagnosis not present

## 2019-05-19 LAB — COMPREHENSIVE METABOLIC PANEL
ALT: 12 U/L (ref 0–44)
AST: 18 U/L (ref 15–41)
Albumin: 4.1 g/dL (ref 3.5–5.0)
Alkaline Phosphatase: 67 U/L (ref 38–126)
Anion gap: 12 (ref 5–15)
BUN: 13 mg/dL (ref 8–23)
CO2: 23 mmol/L (ref 22–32)
Calcium: 9.2 mg/dL (ref 8.9–10.3)
Chloride: 103 mmol/L (ref 98–111)
Creatinine, Ser: 0.7 mg/dL (ref 0.44–1.00)
GFR calc Af Amer: >60 mL/min (ref >60)
GFR calc non Af Amer: >60 mL/min (ref >60)
Glucose, Bld: 131 mg/dL — ABNORMAL HIGH (ref 70–99)
Potassium: 3.9 mmol/L (ref 3.5–5.1)
Sodium: 138 mmol/L (ref 135–145)
Total Bilirubin: 0.7 mg/dL (ref 0.3–1.2)
Total Protein: 7.5 g/dL (ref 6.5–8.1)

## 2019-05-19 LAB — URINALYSIS, ROUTINE W REFLEX MICROSCOPIC
Bilirubin Urine: NEGATIVE
Glucose, UA: NEGATIVE mg/dL
Ketones, ur: NEGATIVE mg/dL
Nitrite: NEGATIVE
Protein, ur: NEGATIVE mg/dL
Specific Gravity, Urine: 1.015 (ref 1.005–1.030)
pH: 6 (ref 5.0–8.0)

## 2019-05-19 LAB — CBC WITH DIFFERENTIAL/PLATELET
Abs Immature Granulocytes: 0.01 10*3/uL (ref 0.00–0.07)
Basophils Absolute: 0 10*3/uL (ref 0.0–0.1)
Basophils Relative: 0 %
Eosinophils Absolute: 0.1 10*3/uL (ref 0.0–0.5)
Eosinophils Relative: 1 %
HCT: 36.7 % (ref 36.0–46.0)
Hemoglobin: 11.3 g/dL — ABNORMAL LOW (ref 12.0–15.0)
Immature Granulocytes: 0 %
Lymphocytes Relative: 41 %
Lymphs Abs: 2.9 10*3/uL (ref 0.7–4.0)
MCH: 26 pg (ref 26.0–34.0)
MCHC: 30.8 g/dL (ref 30.0–36.0)
MCV: 84.4 fL (ref 80.0–100.0)
Monocytes Absolute: 0.5 10*3/uL (ref 0.1–1.0)
Monocytes Relative: 7 %
Neutro Abs: 3.5 10*3/uL (ref 1.7–7.7)
Neutrophils Relative %: 51 %
Platelets: 246 10*3/uL (ref 150–400)
RBC: 4.35 MIL/uL (ref 3.87–5.11)
RDW: 15.1 % (ref 11.5–15.5)
WBC: 7 10*3/uL (ref 4.0–10.5)
nRBC: 0 % (ref 0.0–0.2)

## 2019-05-19 LAB — URINALYSIS, MICROSCOPIC (REFLEX)

## 2019-05-19 LAB — LIPASE, BLOOD: Lipase: 23 U/L (ref 11–51)

## 2019-05-19 MED ORDER — POLYETHYLENE GLYCOL 3350 17 G PO PACK
17.0000 g | PACK | Freq: Every day | ORAL | 0 refills | Status: AC
Start: 2019-05-19 — End: ?

## 2019-05-19 MED ORDER — IOHEXOL 300 MG/ML  SOLN
100.0000 mL | Freq: Once | INTRAMUSCULAR | Status: AC | PRN
  Administered 2019-05-19: 09:00:00 100 mL via INTRAVENOUS

## 2019-05-19 MED ORDER — SODIUM CHLORIDE 0.9 % IV BOLUS
500.0000 mL | Freq: Once | INTRAVENOUS | Status: AC
  Administered 2019-05-19: 500 mL via INTRAVENOUS

## 2019-05-19 MED ORDER — DOCUSATE SODIUM 100 MG PO CAPS: 100.0000 mg | ORAL_CAPSULE | Freq: Two times a day (BID) | ORAL | 0 refills | Status: AC

## 2019-05-19 MED ORDER — MORPHINE SULFATE (PF) 4 MG/ML IV SOLN
4.0000 mg | Freq: Once | INTRAVENOUS | Status: AC
  Administered 2019-05-19: 4 mg via INTRAVENOUS
  Filled 2019-05-19: qty 1

## 2019-05-19 NOTE — ED Triage Notes (Signed)
Pt c/o back pain. Pt had back surgery in march and c/o pain at surgical area.

## 2019-05-19 NOTE — ED Notes (Signed)
Patient transported to CT 

## 2019-05-19 NOTE — ED Provider Notes (Signed)
[facility] EMERGENCY DEPARTMENT Provider Note   CSN: [id number] Arrival date & time: 05/19/19  5732    History   Chief Complaint Chief Complaint  Patient presents with   Back Pain    HPI [name] is a 85 y.o. female.     HPI 85 year old female presents with low back pain running down her leg.  She states that she had surgery for very similar pain in March.  This pain started about 5 days ago.  It is severe.  There is no weakness or numbness.  She is also having some lower abdominal discomfort that she states feels like she is constipated.  She has had this multiple times before.  No vomiting.  She is tried prune juice, water, and a suppository with no relief.  She takes the oxycodone given to her sparingly because she does not want to be an addict.  No fevers.  Past Medical History:  Diagnosis Date   Abnormal breast exam 2004   MMG neg (eval by surgeon neg)   Anemia    Arthritis    "knees" (07/11/2014)   History of blood transfusion    "related to a surgery, I think"   Hx SBO 2004 , 2007, 2012    exploratory surgery 9-12    Iritis 2005   Osteoarthritis    Osteopenia    SBO (small bowel obstruction) ([facility]) 07-2014   Type II diabetes mellitus ([place])     Patient Active Problem List   Diagnosis Date Noted   Spondylolisthesis at L4-L5 level 01/03/2019   Primary osteoarthritis of right hip 06/23/2016   PCP NOTES >>>>>>>>>>>. 05/02/2016   SBO (small bowel obstruction) ([place]) 12/07/2015   Neuropathy 05/23/2015   Dizziness 04/24/2015   Facial pain syndrome 10/23/2014   Pulmonary nodules 07/24/2014   Annual physical exam 08/27/2011   Tinnitus 08/26/2010   SEBORRHEIC KERATOSIS 05/17/2010   Dyslipidemia 04/09/2009   Osteoarthritis 12/21/2008   DMII (diabetes mellitus, type 2) ([place]) 12/01/2007   GERD 02/11/2007   Osteopenia 02/11/2007    Past Surgical History:  Procedure Laterality Date   APPENDECTOMY     CATARACT EXTRACTION  Bilateral    DILATION AND CURETTAGE OF UTERUS     EXPLORATORY LAPAROTOMY  07/2011   SBO   TOTAL ABDOMINAL HYSTERECTOMY  1970's   for bleeding, no cancer   TOTAL HIP ARTHROPLASTY Right 06/23/2016   Procedure: TOTAL HIP ARTHROPLASTY ANTERIOR APPROACH;  Surgeon: Dorna Leitz, MD;  Location: [facility];  Service: Orthopedics;  Laterality: Right;   TOTAL KNEE ARTHROPLASTY Right 10/2010     OB History   No obstetric history on file.      Home Medications    Prior to Admission medications   Medication Sig Start Date End Date Taking? Authorizing Provider  acetaminophen (TYLENOL) 650 MG CR tablet Take 650 mg by mouth every 8 (eight) hours as needed for pain.     [provider]  amoxicillin (AMOXIL) 500 MG capsule Take 2,000 mg by mouth See admin instructions. Take 4 capsules (2000 mg) by mouth 1 hour prior to dental procedures. 07/26/18   [provider]  aspirin EC 81 MG tablet Take 81 mg by mouth daily.    [provider]  Blood Glucose Monitoring Suppl (ONE TOUCH ULTRA 2) w/Device KIT Check blood sugar no more than twice daily 03/04/19   Colon Branch, MD  cholecalciferol (VITAMIN D) 1000 units tablet Take 1,000 Units by mouth daily.     [provider]  docusate sodium (COLACE) 100 MG capsule Take 1 capsule (100 mg total) by mouth every 12 (twelve) hours. 05/19/19   Sherwood Gambler, MD  gabapentin (NEURONTIN) 300 MG capsule Take 300 mg by mouth 3 (three) times daily.  11/02/18   [provider]  glucose blood (ONE TOUCH ULTRA TEST) test strip Check blood sugar no more than twice daily 03/04/19   Colon Branch, MD  Lancet Devices (ONE TOUCH DELICA LANCING DEV) MISC To check blood sugars as directed 07/08/17   Colon Branch, MD  Lancets [facility] ULTRASOFT) lancets Check blood sugar no more than twice daily 03/18/19   Colon Branch, MD  metFORMIN (GLUCOPHAGE) 500 MG tablet Take 1 tablet (500 mg total) by mouth 2 (two) times daily with a meal. 02/01/19   Colon Branch,  MD  Multiple Vitamin (MULTIVITAMIN WITH MINERALS) TABS tablet Take 1 tablet by mouth daily.    [provider]  Omega-3 Fatty Acids (FISH OIL) 1000 MG CAPS Take 1,000 mg by mouth daily.     [provider]  oxyCODONE-acetaminophen (PERCOCET/ROXICET) 5-325 MG tablet Take 1-2 tablets by mouth every 3 (three) hours as needed for moderate pain or severe pain. 01/07/19   Kristeen Miss, MD  polyethylene glycol (MIRALAX / GLYCOLAX) 17 g packet Take 17 g by mouth daily. 05/19/19   Sherwood Gambler, MD  pravastatin (PRAVACHOL) 20 MG tablet Take 1 tablet (20 mg total) by mouth daily. Patient taking differently: Take 20 mg by mouth every evening.  11/29/18   Colon Branch, MD  tiZANidine (ZANAFLEX) 2 MG tablet Take 1 tablet (2 mg total) by mouth every 6 (six) hours as needed. 01/07/19   Kristeen Miss, MD    Family History Family History  Problem Relation Age of Onset   Diabetes Father    Breast cancer Sister    Brain cancer Sister    Sudden death Sister    Renal Disease Son    Heart attack Neg Hx    Colon cancer Neg Hx     Social History Social History   Tobacco Use   Smoking status: Former Smoker   Smokeless tobacco: Never Used   Tobacco comment: "60 years ago"  Substance Use Topics   Alcohol use: No    Alcohol/week: 0.0 standard drinks   Drug use: No     Allergies   Fentanyl, Lipitor [atorvastatin], and Zocor [simvastatin]   Review of Systems Review of Systems  Constitutional: Negative for fever.  Gastrointestinal: Positive for abdominal pain and constipation. Negative for vomiting.  Genitourinary:       No incontinence  Musculoskeletal: Positive for back pain.  Neurological: Negative for weakness and numbness.  All other systems reviewed and are negative.    Physical Exam Updated Vital Signs BP 138/70 (BP Location: Right Arm)    Pulse 84    Temp 98.3 F (36.8 C) (Oral)    Resp 16    Ht 5' 5"  (1.651 m)    Wt 84.8 kg    SpO2 100%    BMI 31.12 kg/m    Physical Exam Vitals signs and nursing note reviewed. Exam conducted with a chaperone present.  Constitutional:      Appearance: She is well-developed.  HENT:     Head: Normocephalic and atraumatic.     Right Ear: External ear normal.     Left Ear: External ear normal.     Nose: Nose normal.  Eyes:     General:  Right eye: No discharge.        Left eye: No discharge.  Cardiovascular:     Rate and Rhythm: Normal rate and regular rhythm.     Heart sounds: Normal heart sounds.  Pulmonary:     Effort: Pulmonary effort is normal.     Breath sounds: Normal breath sounds.  Abdominal:     Palpations: Abdomen is soft.     Tenderness: There is abdominal tenderness (mild) in the left lower quadrant.  Genitourinary:    Rectum: No mass, tenderness, external hemorrhoid or internal hemorrhoid. Normal anal tone.     Comments: Very minimal stool on rectal exam. No gross blood.  Musculoskeletal:     Lumbar back: She exhibits tenderness.       Back:  Skin:    General: Skin is warm and dry.  Neurological:     Mental Status: She is alert.     Comments: 5/5 strength in BLE. Normal gross sensation. Able to ambulate and get up on her own.  Psychiatric:        Mood and Affect: Mood is not anxious.      ED Treatments / Results  Labs (all labs ordered are listed, but only abnormal results are displayed) Labs Reviewed  COMPREHENSIVE METABOLIC PANEL - Abnormal; Notable for the following components:      Result Value   Glucose, Bld 131 (*)    All other components within normal limits  URINALYSIS, ROUTINE W REFLEX MICROSCOPIC - Abnormal; Notable for the following components:   Hgb urine dipstick TRACE (*)    Leukocytes,Ua SMALL (*)    All other components within normal limits  CBC WITH DIFFERENTIAL/PLATELET - Abnormal; Notable for the following components:   Hemoglobin 11.3 (*)    All other components within normal limits  URINALYSIS, MICROSCOPIC (REFLEX) - Abnormal; Notable for the  following components:   Bacteria, UA FEW (*)    All other components within normal limits  LIPASE, BLOOD    EKG None  Radiology Ct Abdomen Pelvis W Contrast  Result Date: 05/19/2019 CLINICAL DATA:  Left lower quadrant abdominal region pain EXAM: CT ABDOMEN AND PELVIS WITH CONTRAST TECHNIQUE: Multidetector CT imaging of the abdomen and pelvis was performed using the standard protocol following bolus administration of intravenous contrast. CONTRAST:  141m OMNIPAQUE IOHEXOL 300 MG/ML  SOLN COMPARISON:  July 10, 2017 FINDINGS: Lower chest: There is bibasilar atelectatic change. No airspace consolidation in the lung bases. Hepatobiliary: No focal liver lesions are evident. There is a fold within the gallbladder, an anatomic variant. No gallbladder wall thickening noted. There is no appreciable biliary duct dilatation. Pancreas: There is no pancreatic mass or inflammatory focus. Spleen: No splenic lesions are evident. Adrenals/Urinary Tract: There is stable left adrenal hypertrophy. The right adrenal appears normal. There is a 1.0 x 0.7 cm cyst in the lateral mid to lower left kidney. There is no evident hydronephrosis on either side. There is an extrarenal pelvis on each side, an anatomic variant. There is no appreciable renal or ureteral calculus on either side. Urinary bladder is midline with wall thickness within normal limits. Stomach/Bowel: There are scattered sigmoid diverticula without diverticulitis. There is no appreciable bowel wall or mesenteric thickening. There is moderate stool in the colon. There is no evident bowel obstruction. Terminal ileum appears normal. There is no evident free air or portal venous air. Vascular/Lymphatic: There is aortic and iliac artery atherosclerosis. No aneurysm evident. No adenopathy is appreciable in the abdomen or pelvis. Reproductive: Uterus is  absent.  No pelvic mass evident. Other: Appendix absent. There is no periappendiceal region inflammatory change.  There is no abscess or ascites in the abdomen or pelvis. There is a small ventral hernia containing only fat. Musculoskeletal: Postoperative changes noted at L4 and L5 with disc spacer at L4-5. There is extensive lower lumbar arthropathy. There are no blastic or lytic bone lesions. There is a total hip replacement on the right. No intramuscular lesions are evident. IMPRESSION: 1. Scattered sigmoid diverticula without demonstrable diverticulitis. No bowel obstruction. No abscess in the abdomen or pelvis. Appendix absent. There is no periappendiceal region inflammatory change. 2.  No renal or ureteral calculi.  No hydronephrosis. 3.  Stable left adrenal hypertrophy. 4.  Aortoiliac atherosclerosis. 5. Postoperative change in the lower lumbar spine with extensive lower lumbar arthropathy. Status post total hip replacement on the right. Electronically Signed   By: Lowella Grip III M.D.   On: 05/19/2019 09:10    Procedures Procedures (including critical care time)  Medications Ordered in ED Medications  sodium chloride 0.9 % bolus 500 mL (0 mLs Intravenous Stopped 05/19/19 0839)  morphine 4 MG/ML injection 4 mg (4 mg Intravenous Given 05/19/19 0747)  iohexol (OMNIPAQUE) 300 MG/ML solution 100 mL (100 mLs Intravenous Contrast Given 05/19/19 0834)     Initial Impression / Assessment and Plan / ED Course  I have reviewed the triage vital signs and the nursing notes.  Pertinent labs & imaging results that were available during my care of the patient were reviewed by me and considered in my medical decision making (see chart for details).        Patient's back pain is probably moderate and she is able to ambulate.  I have encouraged her that she can use the oxycodone if needed.  I think her abdominal discomfort is probably constipation given no focal findings on CT.  There is no impaction.  Thus will help with bowel regimen.  I discussed the need to follow-up with her neurosurgeon, Dr. Ellene Route.  No  fevers or signs/symptoms of cauda equina/spinal cord emergency.  Do not think an emergent MRI is needed but she probably does need an MRI.  We discussed return precautions.  Final Clinical Impressions(s) / ED Diagnoses   Final diagnoses:  Acute left-sided low back pain with left-sided sciatica  Constipation, unspecified constipation type    ED Discharge Orders         Ordered    polyethylene glycol (MIRALAX / GLYCOLAX) 17 g packet  Daily     05/19/19 0934    docusate sodium (COLACE) 100 MG capsule  Every 12 hours     05/19/19 0934           Sherwood Gambler, MD 05/19/19 [phone]

## 2019-05-19 NOTE — Discharge Instructions (Addendum)
If you develop worsening, recurrent, or continued back pain, numbness or weakness in the legs, incontinence of your bowels or bladders, numbness of your buttocks, fever, abdominal pain, or any other new/concerning symptoms then return to the ER for evaluation.  

## 2019-05-30 ENCOUNTER — Ambulatory Visit: Payer: Self-pay | Admitting: Internal Medicine

## 2019-05-30 NOTE — Telephone Encounter (Signed)
FYI

## 2019-05-30 NOTE — Telephone Encounter (Signed)
  Pt. Reports she started having chest pain yesterday. Pain has been constant. Hurts above the left breast and radiates into the back.Pain is 6-7/10. Has "some shortness of breath and sometimes is sweaty." Refuses to go to ED. "I'll just stay home. I can't afford to go to the emergency room." Reason for Disposition . Patient sounds very sick or weak to the triager  Answer Assessment - Initial Assessment Questions 1. LOCATION: "Where does it hurt?"       Above left breast 2. RADIATION: "Does the pain go anywhere else?" (e.g., into neck, jaw, arms, back)     Into her back 3. ONSET: "When did the chest pain begin?" (Minutes, hours or days)      Yesterday 4. PATTERN "Does the pain come and go, or has it been constant since it started?"  "Does it get worse with exertion?"      Constant 5. DURATION: "How long does it last" (e.g., seconds, minutes, hours)     Constant 6. SEVERITY: "How bad is the pain?"  (e.g., Scale 1-10; mild, moderate, or severe)    - MILD (1-3): doesn't interfere with normal activities     - MODERATE (4-7): interferes with normal activities or awakens from sleep    - SEVERE (8-10): excruciating pain, unable to do any normal activities       6-7 7. CARDIAC RISK FACTORS: "Do you have any history of heart problems or risk factors for heart disease?" (e.g., angina, prior heart attack; diabetes, high blood pressure, high cholesterol, smoker, or strong family history of heart disease)     Diabetes 8. PULMONARY RISK FACTORS: "Do you have any history of lung disease?"  (e.g., blood clots in lung, asthma, emphysema, birth control pills)     No 9. CAUSE: "What do you think is causing the chest pain?"     Unsure 10. OTHER SYMPTOMS: "Do you have any other symptoms?" (e.g., dizziness, nausea, vomiting, sweating, fever, difficulty breathing, cough)       Sweating, shortness of breath at times 11. PREGNANCY: "Is there any chance you are pregnant?" "When was your last menstrual period?"     No  Protocols used: CHEST PAIN-A-AH

## 2019-05-31 NOTE — Telephone Encounter (Signed)
Was seen at the ER with back pain and mild abdominal discomfort, abdominal CT with no acute findings.  Was recommended to see Dr. Ellene Route

## 2019-06-03 DIAGNOSIS — M47816 Spondylosis without myelopathy or radiculopathy, lumbar region: Secondary | ICD-10-CM | POA: Diagnosis not present

## 2019-06-13 ENCOUNTER — Other Ambulatory Visit: Payer: Self-pay | Admitting: Internal Medicine

## 2019-07-08 ENCOUNTER — Other Ambulatory Visit: Payer: Self-pay | Admitting: Internal Medicine

## 2019-07-16 ENCOUNTER — Other Ambulatory Visit: Payer: Self-pay | Admitting: Internal Medicine

## 2019-07-27 DIAGNOSIS — M47816 Spondylosis without myelopathy or radiculopathy, lumbar region: Secondary | ICD-10-CM | POA: Diagnosis not present

## 2019-07-27 DIAGNOSIS — R030 Elevated blood-pressure reading, without diagnosis of hypertension: Secondary | ICD-10-CM | POA: Diagnosis not present

## 2019-08-03 ENCOUNTER — Other Ambulatory Visit: Payer: Self-pay

## 2019-08-03 NOTE — Patient Outreach (Signed)
[facility]) Care Management  08/03/2019  LEONTINE GLADEN 1930/02/28 [id number]   Medication Adherence call to Mrs. Jeannine Kitten patient did not answer patient is past due on Pravastatin 20 mg under [place].   [place] Management Direct Dial [phone]  Fax [phone] [email or web address]

## 2019-08-18 ENCOUNTER — Ambulatory Visit (INDEPENDENT_AMBULATORY_CARE_PROVIDER_SITE_OTHER): Payer: Medicare Other | Admitting: Internal Medicine

## 2019-08-18 ENCOUNTER — Other Ambulatory Visit: Payer: Self-pay

## 2019-08-18 ENCOUNTER — Encounter: Payer: Self-pay | Admitting: Internal Medicine

## 2019-08-18 VITALS — BP 133/68 | HR 78 | Temp 96.3°F | Resp 16 | Ht 65.0 in | Wt 187.2 lb

## 2019-08-18 DIAGNOSIS — Z23 Encounter for immunization: Secondary | ICD-10-CM | POA: Diagnosis not present

## 2019-08-18 DIAGNOSIS — E119 Type 2 diabetes mellitus without complications: Secondary | ICD-10-CM | POA: Diagnosis not present

## 2019-08-18 DIAGNOSIS — D649 Anemia, unspecified: Secondary | ICD-10-CM | POA: Diagnosis not present

## 2019-08-18 LAB — CBC WITH DIFFERENTIAL/PLATELET
Basophils Absolute: 0 10*3/uL (ref 0.0–0.1)
Basophils Relative: 0.6 % (ref 0.0–3.0)
Eosinophils Absolute: 0.1 10*3/uL (ref 0.0–0.7)
Eosinophils Relative: 1.3 % (ref 0.0–5.0)
HCT: 36.1 % (ref 36.0–46.0)
Hemoglobin: 11.4 g/dL — ABNORMAL LOW (ref 12.0–15.0)
Lymphocytes Relative: 46 % (ref 12.0–46.0)
Lymphs Abs: 3.1 10*3/uL (ref 0.7–4.0)
MCHC: 31.7 g/dL (ref 30.0–36.0)
MCV: 85.6 fl (ref 78.0–100.0)
Monocytes Absolute: 0.4 10*3/uL (ref 0.1–1.0)
Monocytes Relative: 6.6 % (ref 3.0–12.0)
Neutro Abs: 3.1 10*3/uL (ref 1.4–7.7)
Neutrophils Relative %: 45.5 % (ref 43.0–77.0)
Platelets: 250 10*3/uL (ref 150.0–400.0)
RBC: 4.21 Mil/uL (ref 3.87–5.11)
RDW: 14.4 % (ref 11.5–15.5)
WBC: 6.8 10*3/uL (ref 4.0–10.5)

## 2019-08-18 LAB — IRON: Iron: 76 ug/dL (ref 42–145)

## 2019-08-18 LAB — HEMOGLOBIN A1C: Hgb A1c MFr Bld: 6.5 % (ref 4.6–6.5)

## 2019-08-18 LAB — FERRITIN: Ferritin: 24.2 ng/mL (ref 10.0–291.0)

## 2019-08-18 NOTE — Progress Notes (Signed)
Subjective:    Patient ID: [name], female    DOB: 11/30/1929, 85 y.o.   MRN: [id number]  DOS:  08/18/2019 Type of visit - description: Routine follow-up  Had a L4, L5 decompression bilaterally in March 2020, doing great DM: Good med compliance, due for A1c  Review of Systems History of palpitation, nothing recent.  No chest pain no difficulty breathing No nausea, vomiting, diarrhea  Past Medical History:  Diagnosis Date  . Abnormal breast exam 2004   MMG neg (eval by surgeon neg)  . Anemia   . Arthritis    "knees" (07/11/2014)  . History of blood transfusion    "related to a surgery, I think"  . Hx SBO 2004 , 2007, 2012    exploratory surgery 9-12   . Iritis 2005  . Osteoarthritis   . Osteopenia   . SBO (small bowel obstruction) ([place]) 07-2014  . Type II diabetes mellitus ([place])     Past Surgical History:  Procedure Laterality Date  . APPENDECTOMY    . BACK SURGERY  01-2019   Bilaterally L4, L5 decompression  . CATARACT EXTRACTION Bilateral   . DILATION AND CURETTAGE OF UTERUS    . EXPLORATORY LAPAROTOMY  07/2011   SBO  . TOTAL ABDOMINAL HYSTERECTOMY  1970's   for bleeding, no cancer  . TOTAL HIP ARTHROPLASTY Right 06/23/2016   Procedure: TOTAL HIP ARTHROPLASTY ANTERIOR APPROACH;  Surgeon: Dorna Leitz, MD;  Location: [facility];  Service: Orthopedics;  Laterality: Right;  . TOTAL KNEE ARTHROPLASTY Right 10/2010    Social History   Socioeconomic History  . Marital status: Widowed    Spouse name: Not on file  . Number of children: 3  . Years of education: Not on file  . Highest education level: Not on file  Occupational History  . Occupation: retired     Comment: makes aprons   Social Needs  . Financial resource strain: Not on file  . Food insecurity    Worry: Not on file    Inability: Not on file  . Transportation needs    Medical: Not on file    Non-medical: Not on file  Tobacco Use  . Smoking status: Former Research scientist (life sciences)  . Smokeless tobacco: Never Used   . Tobacco comment: "60 years ago"  Substance and Sexual Activity  . Alcohol use: No    Alcohol/week: 0.0 standard drinks  . Drug use: No  . Sexual activity: Never  Lifestyle  . Physical activity    Days per week: Not on file    Minutes per session: Not on file  . Stress: Not on file  Relationships  . Social Herbalist on phone: Not on file    Gets together: Not on file    Attends religious service: Not on file    Active member of club or organization: Not on file    Attends meetings of clubs or organizations: Not on file    Relationship status: Not on file  . Intimate partner violence    Fear of current or ex partner: Not on file    Emotionally abused: Not on file    Physically abused: Not on file    Forced sexual activity: Not on file  Other Topics Concern  . Not on file  Social History Narrative   Lives by herself, her family is in [place], lost 2 sisters   Lost a son 11-2015   Daughter in [place]   Right handed   No  caffeine      Allergies as of 08/18/2019      Reactions   Fentanyl Rash   Rash at injection site   Lipitor [atorvastatin] Other (See Comments)   tinnitus   Zocor [simvastatin] Other (See Comments)   Ear ringing      Medication List       Accurate as of August 18, 2019 11:59 PM. If you have any questions, ask your nurse or doctor.        STOP taking these medications   amoxicillin 500 MG capsule Commonly known as: AMOXIL Stopped by: Kathlene November, MD   gabapentin 300 MG capsule Commonly known as: NEURONTIN Stopped by: Kathlene November, MD   oxyCODONE-acetaminophen 5-325 MG tablet Commonly known as: PERCOCET/ROXICET Stopped by: Kathlene November, MD   tiZANidine 2 MG tablet Commonly known as: ZANAFLEX Stopped by: Kathlene November, MD     TAKE these medications   acetaminophen 650 MG CR tablet Commonly known as: TYLENOL Take 650 mg by mouth every 8 (eight) hours as needed for pain.   aspirin EC 81 MG tablet Take 81 mg by mouth daily.    cholecalciferol 1000 units tablet Commonly known as: VITAMIN D Take 1,000 Units by mouth daily.   docusate sodium 100 MG capsule Commonly known as: COLACE Take 1 capsule (100 mg total) by mouth every 12 (twelve) hours.   Fish Oil 1000 MG Caps Take 1,000 mg by mouth daily.   glucose blood test strip Commonly known as: ONE TOUCH ULTRA TEST Check blood sugar no more than twice daily   metFORMIN 500 MG tablet Commonly known as: GLUCOPHAGE Take 1 tablet (500 mg total) by mouth 2 (two) times daily with a meal.   multivitamin with minerals Tabs tablet Take 1 tablet by mouth daily.   ONE TOUCH DELICA LANCING DEV Misc To check blood sugars as directed   ONE TOUCH ULTRA 2 w/Device Kit Check blood sugar no more than twice daily   onetouch ultrasoft lancets Check blood sugar no more than twice daily   polyethylene glycol 17 g packet Commonly known as: MIRALAX / GLYCOLAX Take 17 g by mouth daily.   pravastatin 20 MG tablet Commonly known as: PRAVACHOL Take 1 tablet (20 mg total) by mouth daily.           Objective:   Physical Exam BP 133/68 (BP Location: Left Arm, Patient Position: Sitting, Cuff Size: Normal)   Pulse 78   Temp (!) 96.3 F (35.7 C) (Temporal)   Resp 16   Ht 5' 5"  (1.651 m)   Wt 187 lb 4 oz (84.9 kg)   SpO2 100%   BMI 31.16 kg/m  General:   Well developed, NAD, BMI noted. HEENT:  Normocephalic . Face symmetric, atraumatic Lungs:  CTA B Normal respiratory effort, no intercostal retractions, no accessory muscle use. Heart: RRR,  no murmur.  No pretibial edema bilaterally  Skin: Not pale. Not jaundice Neurologic:  alert & oriented X3.  Speech normal, gait appropriate for age and unassisted Psych--  Cognition and judgment appear intact.  Cooperative with normal attention span and concentration.  Behavior appropriate. No anxious or depressed appearing.      Assessment     Assessment DM  hyperlipidemia Osteopenia-- dexa 05-2015   T score  -1.4 Osteoarthritis, chronic back pain -Dr Mina Marble note from 09/27/2018 : MRI  central canal stenosis, L4-L5,  local injections, no much help   - S/p surgery 01/2019: very good results. Iritis Multiple SBO, exploratory surgery 2012  Tinnitus chronic, c/o HOH: saw neuro 11/2017, MRI was done, no report, then she saw ENT (Rx conservative treatment)  PLAN: DM: Currently on metformin, check A1c.  Last BMP satisfactory Back pain: Had surgery 01-2019, doing great. Mild chronic anemia: Rechecking CBC, iron, ferritin, GI ROS negative. Preventive care: Flu shot RTC CPX 1/ 2021

## 2019-08-18 NOTE — Progress Notes (Signed)
Pre visit review using our clinic review tool, if applicable. No additional management support is needed unless otherwise documented below in the visit note. 

## 2019-08-18 NOTE — Patient Instructions (Addendum)
Please schedule Medicare Wellness with Glenard Haring.   Per our records you are due for an eye exam. Please contact your eye doctor to schedule an appointment. Please have them send copies of your office visit notes to [place]. Our fax number is [phone].   GO TO THE LAB : Get the blood work     GO TO THE FRONT DESK Schedule your next appointment   for physical exam in 3 months

## 2019-08-19 NOTE — Assessment & Plan Note (Signed)
DM: Currently on metformin, check A1c.  Last BMP satisfactory Back pain: Had surgery 01-2019, doing great. Mild chronic anemia: Rechecking CBC, iron, ferritin, GI ROS negative. Preventive care: Flu shot RTC CPX 1/ 2021

## 2019-09-02 ENCOUNTER — Other Ambulatory Visit: Payer: Self-pay

## 2019-09-02 NOTE — Patient Outreach (Signed)
[facility]) Care Management  09/02/2019  PARILEE NYHUS [birth year] [id number]   Medication Adherence call to Mrs. Jeannine Kitten Telephone call to Patient regarding Medication Adherence unable to reach patient. Mrs. Durocher did not answer she is past due on Pravastatin 20 mg under [place].   [place] Management Direct Dial [phone]  Fax [phone] [email or web address]

## 2019-09-12 ENCOUNTER — Other Ambulatory Visit: Payer: Self-pay

## 2019-09-12 NOTE — Patient Outreach (Signed)
[facility]) Care Management  09/12/2019  [name] Jul 30, 1930 [id number]   Medication Adherence call to [name] Telephone call to Patient regarding Medication Adherence unable to reach patient. [name] is showing past due on Pravastatin 20 mg under [place].   [place] Management Direct Dial [phone]  Fax [phone] [name]

## 2019-10-03 ENCOUNTER — Other Ambulatory Visit: Payer: Self-pay | Admitting: Internal Medicine

## 2019-11-10 DIAGNOSIS — H04123 Dry eye syndrome of bilateral lacrimal glands: Secondary | ICD-10-CM | POA: Diagnosis not present

## 2019-11-10 DIAGNOSIS — H401111 Primary open-angle glaucoma, right eye, mild stage: Secondary | ICD-10-CM | POA: Diagnosis not present

## 2019-11-10 DIAGNOSIS — Z961 Presence of intraocular lens: Secondary | ICD-10-CM | POA: Diagnosis not present

## 2019-11-10 DIAGNOSIS — H401122 Primary open-angle glaucoma, left eye, moderate stage: Secondary | ICD-10-CM | POA: Diagnosis not present

## 2019-11-10 DIAGNOSIS — H43822 Vitreomacular adhesion, left eye: Secondary | ICD-10-CM | POA: Diagnosis not present

## 2019-11-10 DIAGNOSIS — H35371 Puckering of macula, right eye: Secondary | ICD-10-CM | POA: Diagnosis not present

## 2019-11-10 LAB — HM DIABETES EYE EXAM

## 2019-11-18 ENCOUNTER — Ambulatory Visit: Payer: Medicare Other | Admitting: Internal Medicine

## 2019-11-22 ENCOUNTER — Other Ambulatory Visit: Payer: Self-pay

## 2019-11-22 ENCOUNTER — Encounter: Payer: Self-pay | Admitting: Internal Medicine

## 2019-11-22 ENCOUNTER — Ambulatory Visit (INDEPENDENT_AMBULATORY_CARE_PROVIDER_SITE_OTHER): Payer: Medicare HMO | Admitting: Internal Medicine

## 2019-11-22 VITALS — BP 122/68 | HR 76 | Temp 96.0°F | Resp 16 | Ht 65.0 in | Wt 193.0 lb

## 2019-11-22 DIAGNOSIS — E119 Type 2 diabetes mellitus without complications: Secondary | ICD-10-CM | POA: Diagnosis not present

## 2019-11-22 DIAGNOSIS — Z780 Asymptomatic menopausal state: Secondary | ICD-10-CM

## 2019-11-22 DIAGNOSIS — Z0000 Encounter for general adult medical examination without abnormal findings: Secondary | ICD-10-CM | POA: Diagnosis not present

## 2019-11-22 DIAGNOSIS — M8580 Other specified disorders of bone density and structure, unspecified site: Secondary | ICD-10-CM | POA: Diagnosis not present

## 2019-11-22 DIAGNOSIS — E785 Hyperlipidemia, unspecified: Secondary | ICD-10-CM | POA: Diagnosis not present

## 2019-11-22 NOTE — Progress Notes (Signed)
Subjective:    Patient ID: [name], female    DOB: Nov 29, 1929, 86 y.o.   MRN: [id number]  DOS:  11/22/2019 Type of visit - description: CPX She is actually feeling great. She has no concerns except occasional palpitations   Review of Systems  Other than above, a 14 point review of systems is negative     Past Medical History:  Diagnosis Date  . Abnormal breast exam 2004   MMG neg (eval by surgeon neg)  . Anemia   . Arthritis    "knees" (07/11/2014)  . History of blood transfusion    "related to a surgery, I think"  . Hx SBO 2004 , 2007, 2012    exploratory surgery 9-12   . Iritis 2005  . Osteoarthritis   . Osteopenia   . SBO (small bowel obstruction) ([place]) 07-2014  . Type II diabetes mellitus ([place])     Past Surgical History:  Procedure Laterality Date  . APPENDECTOMY    . BACK SURGERY  01-2019   Bilaterally L4, L5 decompression  . CATARACT EXTRACTION Bilateral   . DILATION AND CURETTAGE OF UTERUS    . EXPLORATORY LAPAROTOMY  07/2011   SBO  . TOTAL ABDOMINAL HYSTERECTOMY  1970's   for bleeding, no cancer  . TOTAL HIP ARTHROPLASTY Right 06/23/2016   Procedure: TOTAL HIP ARTHROPLASTY ANTERIOR APPROACH;  Surgeon: Dorna Leitz, MD;  Location: [facility];  Service: Orthopedics;  Laterality: Right;  . TOTAL KNEE ARTHROPLASTY Right 10/2010   Family History  Problem Relation Age of Onset  . Diabetes Father   . Breast cancer Sister   . Brain cancer Sister   . Sudden death Sister   . Renal Disease Son   . Heart attack Neg Hx   . Colon cancer Neg Hx         Objective:   Physical Exam BP 122/68 (BP Location: Left Arm, Patient Position: Sitting, Cuff Size: Normal)   Pulse 76   Temp (!) 96 F (35.6 C) (Temporal)   Resp 16   Ht 5\' 5"  (1.651 m)   Wt 193 lb (87.5 kg)   SpO2 100%   BMI 32.12 kg/m       General: Well developed, NAD, BMI noted Neck: No  thyromegaly  HEENT:  Normocephalic . Face symmetric, atraumatic Lungs:  CTA B Normal respiratory effort,  no intercostal retractions, no accessory muscle use. Heart: RRR,  no murmur.  No pretibial edema bilaterally  Abdomen:  Not distended, soft, non-tender. No rebound or rigidity.   Skin: Exposed areas without rash. Not pale. Not jaundice Neurologic:  alert & oriented X3.  Speech normal, gait approassisted by a cane, needs help transferring Motor symmetric and appropriate for age.  Psych: Cognition and judgment appear intact.  Cooperative with normal attention span and concentration.  Behavior appropriate. No anxious or depressed appearing.  Assessment    Assessment DM  hyperlipidemia Osteopenia-- dexa 05-2015   T score -1.4 Osteoarthritis, chronic back pain -Dr Mina Marble note from 09/27/2018 : MRI  central canal stenosis, L4-L5,  local injections, no much help   - S/p surgery 01/2019: very good results. Iritis Multiple SBO, exploratory surgery 2012 Tinnitus chronic, c/o HOH: saw neuro 11/2017, MRI was done, no report, then she saw ENT (Rx conservative treatment)  PLAN: Here for CPX DM: Last A1c satisfactory, on Metformin, check a microalbumin. Hyperlipidemia: On Pravachol, checking FLP. Osteopenia: We agreed to proceed with a bone density test, benefits discussed DJD, chronic back pain: Doing great  after the back  surgery 01-2019.  Uses a cane, does have some issues with transferring. Social: Lives by herself, still drives, able to do most of her home chores except cleaning house.  Still manages all her finances RTC 6 months  This visit occurred during the SARS-CoV-2 public health emergency.  Safety protocols were in place, including screening questions prior to the visit, additional usage of staff PPE, and extensive cleaning of exam room while observing appropriate contact time as indicated for disinfecting solutions.

## 2019-11-22 NOTE — Patient Instructions (Addendum)
Please schedule Medicare Wellness with Glenard Haring.   Per our records you are due for an eye exam. Please contact your eye doctor to schedule an appointment. Please have them send copies of your office visit notes to [place]. Our fax number is [phone].   GO TO THE LAB : Get the blood work     GO TO THE FRONT DESK Schedule your next appointment for a checkup in 6 months   We will call you about your bone density test

## 2019-11-22 NOTE — Progress Notes (Signed)
Pre visit review using our clinic review tool, if applicable. No additional management support is needed unless otherwise documented below in the visit note. 

## 2019-11-23 LAB — COMPREHENSIVE METABOLIC PANEL
ALT: 11 U/L (ref 0–35)
AST: 16 U/L (ref 0–37)
Albumin: 4.3 g/dL (ref 3.5–5.2)
Alkaline Phosphatase: 74 U/L (ref 39–117)
BUN: 13 mg/dL (ref 6–23)
CO2: 29 mEq/L (ref 19–32)
Calcium: 9.8 mg/dL (ref 8.4–10.5)
Chloride: 103 mEq/L (ref 96–112)
Creatinine, Ser: 0.75 mg/dL (ref 0.40–1.20)
GFR: 88.4 mL/min (ref >60.00)
Glucose, Bld: 85 mg/dL (ref 70–99)
Potassium: 4.8 mEq/L (ref 3.5–5.1)
Sodium: 140 mEq/L (ref 135–145)
Total Bilirubin: 0.7 mg/dL (ref 0.2–1.2)
Total Protein: 7.3 g/dL (ref 6.0–8.3)

## 2019-11-23 LAB — MICROALBUMIN / CREATININE URINE RATIO
Creatinine,U: 130.2 mg/dL
Microalb Creat Ratio: 0.5 mg/g (ref 0.0–30.0)
Microalb, Ur: <0.7 mg/dL (ref 0.0–1.9)

## 2019-11-23 LAB — LIPID PANEL
Cholesterol: 196 mg/dL (ref 0–200)
HDL: 55.8 mg/dL (ref >39.00)
LDL Cholesterol: 118 mg/dL — ABNORMAL HIGH (ref 0–99)
NonHDL: 140.11
Total CHOL/HDL Ratio: 4
Triglycerides: 112 mg/dL (ref 0.0–149.0)
VLDL: 22.4 mg/dL (ref 0.0–40.0)

## 2019-11-24 ENCOUNTER — Encounter: Payer: Self-pay | Admitting: Internal Medicine

## 2019-11-24 NOTE — Assessment & Plan Note (Signed)
Here for CPX DM: Last A1c satisfactory, on Metformin, check a microalbumin. Hyperlipidemia: On Pravachol, checking FLP. Osteopenia: We agreed to proceed with a bone density test, benefits discussed DJD, chronic back pain: Doing great after the back  surgery 01-2019.  Uses a cane, does have some issues with transferring. Social: Lives by herself, still drives, able to do most of her home chores except cleaning house.  Still manages all her finances RTC 6 months

## 2019-11-24 NOTE — Assessment & Plan Note (Signed)
-  Td 11/2018 - Pneumonia shot 10-05 and 2010;  Prevnar: 2016 - shingles shot :2014; s/p shingrix  - had a flu shot   -CCS:  Cscope 2004 normal   Cscope 03/2014 Diverticulosis, no need to repeat due to age and results  -No further PAPs, MMGs   -Diet: Follows a healthy diet -Labs: CMP, FLP, microalbumin

## 2019-11-28 MED ORDER — PRAVASTATIN SODIUM 40 MG PO TABS: 40.0000 mg | ORAL_TABLET | Freq: Every day | ORAL | 1 refills | Status: DC

## 2019-11-28 NOTE — Addendum Note (Signed)
Addended byDamita Dunnings D on: 11/28/2019 07:43 AM   Modules accepted: Orders

## 2020-01-05 ENCOUNTER — Other Ambulatory Visit: Payer: Self-pay | Admitting: Internal Medicine

## 2020-01-05 ENCOUNTER — Encounter: Payer: Self-pay | Admitting: Internal Medicine

## 2020-01-19 ENCOUNTER — Other Ambulatory Visit (HOSPITAL_BASED_OUTPATIENT_CLINIC_OR_DEPARTMENT_OTHER): Payer: Medicare HMO

## 2020-01-24 ENCOUNTER — Other Ambulatory Visit: Payer: Self-pay

## 2020-01-24 ENCOUNTER — Ambulatory Visit (HOSPITAL_BASED_OUTPATIENT_CLINIC_OR_DEPARTMENT_OTHER)
Admission: RE | Admit: 2020-01-24 | Discharge: 2020-01-24 | Disposition: A | Discharge status: Home / Self Care | Payer: Medicare HMO | Source: Ambulatory Visit | Attending: Internal Medicine | Admitting: Internal Medicine

## 2020-01-24 DIAGNOSIS — M810 Age-related osteoporosis without current pathological fracture: Secondary | ICD-10-CM | POA: Diagnosis not present

## 2020-01-24 DIAGNOSIS — M8580 Other specified disorders of bone density and structure, unspecified site: Secondary | ICD-10-CM | POA: Insufficient documentation

## 2020-01-24 DIAGNOSIS — Z780 Asymptomatic menopausal state: Secondary | ICD-10-CM | POA: Insufficient documentation

## 2020-01-27 MED ORDER — ALENDRONATE SODIUM 70 MG PO TABS: 70.0000 mg | ORAL_TABLET | ORAL | 11 refills | Status: DC

## 2020-01-27 NOTE — Addendum Note (Signed)
Addended byDamita Dunnings D on: 01/27/2020 01:27 PM   Modules accepted: Orders

## 2020-02-29 ENCOUNTER — Telehealth: Payer: Self-pay | Admitting: Internal Medicine

## 2020-02-29 NOTE — Progress Notes (Signed)
  Chronic Care Management   Outreach Note  02/29/2020 Name: [name] MRN: [id number] DOB: 1930-06-27  Referred by: Colon Branch, MD Reason for referral : No chief complaint on file.   An unsuccessful telephone outreach was attempted today. The patient was referred to the pharmacist for assistance with care management and care coordination.    This note is not being shared with the patient for the following reason: To respect privacy (The patient or proxy has requested that the information not be shared).  Follow Up Plan:   Raynicia Dukes [employer] Scheduler

## 2020-02-29 NOTE — Chronic Care Management (AMB) (Signed)
  Chronic Care Management   Note  02/29/2020 Name: [name] MRN: [id number] DOB: 1929-11-19  [name] is a 86 y.o. year old female who is a primary care patient of Larose Kells, Alda Berthold, MD. I reached out to Quincy Carnes by phone today in response to a referral sent by [name]'s PCP, Colon Branch, MD.   [name] was given information about Chronic Care Management services today including:  1. CCM service includes personalized support from designated clinical staff supervised by her physician, including individualized plan of care and coordination with other care providers 2. 24/7 contact phone numbers for assistance for urgent and routine care needs. 3. Service will only be billed when office clinical staff spend 20 minutes or more in a month to coordinate care. 4. Only one practitioner may furnish and bill the service in a calendar month. 5. The patient may stop CCM services at any time (effective at the end of the month) by phone call to the office staff.   Patient agreed to services and verbal consent obtained.    This note is not being shared with the patient for the following reason: To respect privacy (The patient or proxy has requested that the information not be shared).  Follow up plan:   Raynicia Dukes [employer] Scheduler

## 2020-04-09 ENCOUNTER — Other Ambulatory Visit: Payer: Self-pay

## 2020-04-09 DIAGNOSIS — E785 Hyperlipidemia, unspecified: Secondary | ICD-10-CM

## 2020-04-09 DIAGNOSIS — E119 Type 2 diabetes mellitus without complications: Secondary | ICD-10-CM

## 2020-04-11 ENCOUNTER — Ambulatory Visit: Payer: Medicare HMO | Admitting: Pharmacist

## 2020-04-11 ENCOUNTER — Other Ambulatory Visit: Payer: Self-pay

## 2020-04-11 VITALS — BP 120/62 | HR 81 | Wt 196.4 lb

## 2020-04-11 DIAGNOSIS — M810 Age-related osteoporosis without current pathological fracture: Secondary | ICD-10-CM

## 2020-04-11 DIAGNOSIS — E119 Type 2 diabetes mellitus without complications: Secondary | ICD-10-CM

## 2020-04-11 DIAGNOSIS — E785 Hyperlipidemia, unspecified: Secondary | ICD-10-CM

## 2020-04-11 NOTE — Chronic Care Management (AMB) (Signed)
Chronic Care Management Pharmacy  Name: [name]  MRN: [id number] DOB: 1930/07/05   Chief Complaint/ HPI  [name],  86 y.o. , female presents for their Initial CCM visit with the clinical pharmacist In office (scheduled as phone visit, but patient came to office).  PCP : Colon Branch, MD  Their chronic conditions include: Diabetes, Hyperlipidemia, Osteoporosis, Osteoarthritis  Office Visits: 11/22/19: Visit w/ Dr. Larose Kells - Labs ordered (lipid, cmp, urine ma, bone density). Pravastatin increased to 15m daily.  Consult Visit: None in last 6 months.  Medications: Outpatient Encounter Medications as of 04/11/2020  Medication Sig  . acetaminophen (TYLENOL) 650 MG CR tablet Take 650 mg by mouth every 8 (eight) hours as needed for pain.   .Marland Kitchenalendronate (FOSAMAX) 70 MG tablet Take 1 tablet (70 mg total) by mouth once a week. Take with a full glass of water on an empty stomach.  .Marland Kitchenaspirin EC 81 MG tablet Take 81 mg by mouth daily.  . Blood Glucose Monitoring Suppl (ONE TOUCH ULTRA 2) w/Device KIT Check blood sugar no more than twice daily  . cholecalciferol (VITAMIN D) 1000 units tablet Take 1,000 Units by mouth daily.   . metFORMIN (GLUCOPHAGE) 500 MG tablet Take 1 tablet (500 mg total) by mouth 2 (two) times daily with a meal.  . Multiple Vitamin (MULTIVITAMIN WITH MINERALS) TABS tablet Take 1 tablet by mouth daily.  . pravastatin (PRAVACHOL) 40 MG tablet Take 1 tablet (40 mg total) by mouth at bedtime.  . docusate sodium (COLACE) 100 MG capsule Take 1 capsule (100 mg total) by mouth every 12 (twelve) hours. (Patient not taking: Reported on 08/18/2019)  . glucose blood (ONE TOUCH ULTRA TEST) test strip Check blood sugar no more than twice daily  . Lancet Devices (ONE TOUCH DELICA LANCING DEV) MISC To check blood sugars as directed  . Lancets (ONETOUCH ULTRASOFT) lancets Check blood sugar no more than twice daily  . Omega-3 Fatty Acids (FISH OIL) 1000 MG CAPS Take 1,000 mg by  mouth daily.   . polyethylene glycol (MIRALAX / GLYCOLAX) 17 g packet Take 17 g by mouth daily. (Patient not taking: Reported on 08/18/2019)   No facility-administered encounter medications on file as of 04/11/2020.   SDOH Screenings   Alcohol Screen:   . Last Alcohol Screening Score (AUDIT):   Depression (PHQ2-9): Low Risk   . PHQ-2 Score: 0  Financial Resource Strain: Low Risk   . Difficulty of Paying Living Expenses: Not very hard  Food Insecurity:   . Worried About RCharity fundraiserin the Last Year:   . [place]in the Last Year:   Housing:   . Last Housing Risk Score:   Physical Activity:   . Days of Exercise per Week:   . Minutes of Exercise per Session:   Social Connections:   . Frequency of Communication with Friends and Family:   . Frequency of Social Gatherings with Friends and Family:   . Attends Religious Services:   . Active Member of Clubs or Organizations:   . Attends CArchivistMeetings:   .Marland KitchenMarital Status:   Stress:   . Feeling of Stress :   Tobacco Use: Medium Risk  . Smoking Tobacco Use: Former Smoker  . Smokeless Tobacco Use: Never Used  Transportation Needs:   . LFilm/video editor(Medical):   .Marland KitchenLack of Transportation (Non-Medical):      Current Diagnosis/Assessment:  Goals Addressed  This Visit's Progress   . Chronic Care Management Pharmacy Care Plan       CARE PLAN ENTRY  Current Barriers:  . Chronic Disease Management support, education, and care coordination needs related to Diabetes, Hyperlipidemia, Osteoporosis, Osteoarthritis  Hyperlipidemia . Pharmacist Clinical Goal(s): o Over the next 90 days, patient will work with PharmD and providers to achieve LDL goal < 100 . Current regimen:  o Pravastatin 81m daily . Interventions: o Discussed LDL goals and possibility for needed med titration . Patient self care activities - Over the next 90 days, patient will: o Maintain cholesterol medication  regimen.   Diabetes . Pharmacist Clinical Goal(s): o Over the next 90 days, patient will work with PharmD and providers to maintain A1c goal <7% . Current regimen:  o Metformin 5064mtwice daily . Patient self care activities - Over the next 90 days, patient will: o Maintain a1c <7%  Osteoporosis . Pharmacist Clinical Goal(s) o Over the next 90 days, patient will work with PharmD and providers to reduce risk of fracture due to osteoporosis  . Current regimen:  o Alendronate 7056meekly, vitamin D 1000 units daily . Interventions: o Recommended patient consume at least 1200m23m calcium via diet and/or supplementation . Patient self care activities - Over the next 90 days, patient will: o Maintain osteoporosis medication regimen  Medication management . Pharmacist Clinical Goal(s): o Over the next 90 days, patient will work with PharmD and providers to maintain optimal medication adherence . Current pharmacy: [facility] . Interventions o Comprehensive medication review performed. o Continue current medication management strategy . Patient self care activities - Over the next 90 days, patient will: o Focus on medication adherence by filling and taking medications appropriately  o Take medications as prescribed o Report any questions or concerns to PharmD and/or provider(s)  Initial goal documentation      Social Hx:  Patient is widowed of 20 years. Lives alone. [place], great-grand, and great-great-grandchildren.  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  She would like resources that could help her with cleaning her home.  Diabetes   Recent Relevant Labs: Lab Results  Component Value Date/Time   HGBA1C 6.5 08/18/2019 11:50 AM   HGBA1C 6.7 (H) 11/17/2018 02:10 PM   MICROALBUR <0.7  11/22/2019 02:57 PM   MICROALBUR 0.8 11/17/2018 02:10 PM    A1c goal <7%  Kidney Function Lab Results  Component Value Date/Time   CREATININE 0.75 11/22/2019 02:57 PM   CREATININE 0.70 05/19/2019 07:38 AM   CREATININE 0.72 12/05/2016 02:59 PM   GFR 88.40 11/22/2019 02:57 PM   GFRNONAA >60 05/19/2019 07:38 AM   GFRAA >60 05/19/2019 07:38 AM   Patient has failed these meds in past: None noted  Patient is currently controlled on the following medications: Metformin 500mg107mce daily  She states she keeps her sugars down and eats appropriately.  Last diabetic Eye exam:  Lab Results  Component Value Date/Time   HMDIABEYEEXA No Retinopathy 11/10/2019 12:00 AM    Last diabetic Foot exam:  Lab Results  Component Value Date/Time   HMDIABFOOTEX Done/Dr. Paz 0Larose Kells9/2016 12:00 AM    Plan -Continue current medications    Hyperlipidemia   Lipid Panel     Component Value Date/Time   CHOL 196 11/22/2019 1457  TRIG 112.0 11/22/2019 1457   HDL 55.80 11/22/2019 1457   LDLCALC 118 (H) 11/22/2019 1457    LDL goal <100  The ASCVD Risk score Mikey Bussing DC Jr., et al., 2013) failed to calculate for the following reasons:   The 2013 ASCVD risk score is only valid for ages 78 to 22   Patient has failed these meds in past: atorvastatin, simvastatin (tinnitus), pitavastatin (cost), ezetimibe (intolerable) Patient is currently uncontrolled on the following medications: Pravastatin 75m daily  We discussed:  LDL goal and association with lowered risk of heart attack and stroke. Discussed possibility of further med titration in future to achieve goal.   Plan -Continue current medications   Future Plan -If LDL >100 at next lipid panel consider increasing pravastatin to 851mdaily or changing to rosuvastatin 2035maily. Noted that patient has not tolerated other statins in the past.  Osteoporosis   Last DEXA Scan: 01/24/2020  T-Score femoral neck: -0.7  T-Score lumbar spine: 0.6  T-Score  forearm radius: -2.5  Vit D, 25-Hydroxy  Date Value Ref Range Status  04/25/2010 30 30 - 89 ng/mL Final    Comment:    See lab report for associated comment(s)     Patient has failed these meds in past: None noted  Patient is currently uncontrolled on the following medications: Alendronate 80m37mekly on Wed, vitamin D 1000 units daily  We discussed:  Recommend 1200 mg of calcium daily from dietary and supplemental sources.  Plan -Increase calcium intake to 1200mg76mly via diet and/or supplementation in addition to current medications -Continue current medications    Osteoarthritis     Patient has failed these meds in past: None noted  Patient is currently controlled on the following medications: acetaminophen 650mg 76my   Plan -Continue current medications   Miscellaneous Meds  Aspirin 81mg  80mivitamin  Meds to D/C from list  Docusate Miralax Fish Oil

## 2020-04-11 NOTE — Patient Instructions (Signed)
Visit Information  Goals Addressed            This Visit's Progress    Chronic Care Management Pharmacy Care Plan       CARE PLAN ENTRY  Current Barriers:   Chronic Disease Management support, education, and care coordination needs related to Diabetes, Hyperlipidemia, Osteoporosis, Osteoarthritis  Hyperlipidemia  Pharmacist Clinical Goal(s): o Over the next 90 days, patient will work with PharmD and providers to achieve LDL goal < 100  Current regimen:  o Pravastatin 40mg  daily  Interventions: o Discussed LDL goals and possibility for needed med titration  Patient self care activities - Over the next 90 days, patient will: o Maintain cholesterol medication regimen.   Diabetes  Pharmacist Clinical Goal(s): o Over the next 90 days, patient will work with PharmD and providers to maintain A1c goal <7%  Current regimen:  o Metformin 500mg  twice daily  Patient self care activities - Over the next 90 days, patient will: o Maintain a1c <7%  Osteoporosis  Pharmacist Clinical Goal(s) o Over the next 90 days, patient will work with PharmD and providers to reduce risk of fracture due to osteoporosis   Current regimen:  o Alendronate 70mg  weekly, vitamin D 1000 units daily  Interventions: o Recommended patient consume at least 1200mg  of calcium via diet and/or supplementation  Patient self care activities - Over the next 90 days, patient will: o Maintain osteoporosis medication regimen  Medication management  Pharmacist Clinical Goal(s): o Over the next 90 days, patient will work with PharmD and providers to maintain optimal medication adherence  [facility]  Interventions o Comprehensive medication review performed. o Continue current medication management strategy  Patient self care activities - Over the next 90 days, patient will: o Focus on medication adherence by filling and taking medications appropriately  o Take medications as  prescribed o Report any questions or concerns to PharmD and/or provider(s)  Initial goal documentation        Ms. Rowles was given information about Chronic Care Management services today including:  1. CCM service includes personalized support from designated clinical staff supervised by her physician, including individualized plan of care and coordination with other care providers 2. 24/7 contact phone numbers for assistance for urgent and routine care needs. 3. Standard insurance, coinsurance, copays and deductibles apply for chronic care management only during months in which we provide at least 20 minutes of these services. Most insurances cover these services at 100%, however patients may be responsible for any copay, coinsurance and/or deductible if applicable. This service may help you avoid the need for more expensive face-to-face services. 4. Only one practitioner may furnish and bill the service in a calendar month. 5. The patient may stop CCM services at any time (effective at the end of the month) by phone call to the office staff.  Patient agreed to services and verbal consent obtained.   The patient verbalized understanding of instructions provided today and agreed to receive a mailed copy of patient instruction and/or educational materials. Telephone follow up appointment with pharmacy team member scheduled for: 07/13/2020  Melvenia Beam Pinchas Reither, PharmD Clinical Pharmacist [facility] [phone]    Eating Plan for Osteoporosis Osteoporosis causes your bones to become weak and brittle. This puts you at greater risk for bone breaks (fractures) from small bumps or falls. Making changes to your diet and increasing your physical activity can help strengthen your bones and improve your overall health. Calcium and vitamin D are  nutrients that play an important role in bone health. Vitamin D helps your body use calcium and strengthen bones. Therefore, it is  important to get enough calcium and vitamin D as part of your eating plan for osteoporosis. What are tips for following this plan? Reading food labels  Try to get at least 1,000 milligrams (mg) of calcium each Amery Minasyan.  Look for foods that have at least 50 mg of calcium per serving.  Talk with your health care provider about taking a calcium supplement if you do not get enough calcium from food.  Do not have more than 2,500 mg of calcium each Jax Kentner. This is the upper limit for food and nutritional supplements combined. Too much calcium may cause constipation and prevent you from absorbing other important nutrients.  Choose foods that contain vitamin D.  Take a daily vitamin supplement that contains 800-1,000 international units (IU) of vitamin D. The amount may be different depending on your age, body weight, ethnicity, and where you live. Talk with your dietitian or health care provider about how much vitamin D is right for you.  Avoid foods that have more than 300 mg of sodium per serving. Too much sodium can cause your body to lose calcium.  Talk with your dietitian or health care provider about how much sodium you are allowed each Ayahna Solazzo. Shopping  Do not buy foods with added salt, including: ? Salted snacks. ? Angie Fava. ? Canned soups. ? Canned meats. ? Processed meats, such as bacon or cold cuts. ? Smoked fish. Meal planning  Eat balanced meals that contain protein foods, fruits and vegetables, and foods rich in calcium and vitamin D.  Eat at least 5 servings of fruits and vegetables each Rylee Nuzum.  Eat 5-6 oz. of lean meat, poultry, fish, eggs, or beans each Loyed Wilmes. Lifestyle  Do not use any products that contain nicotine or tobacco, such as cigarettes and e-cigarettes. If you need help quitting, ask your health care provider.  If your health care provider recommends that you lose weight: ? Work with a dietitian to develop an eating plan that will help you reach your desired weight  goal. ? Exercise for at least 30 minutes a Adison Jerger, 5 or more days a week, or as told by your health care provider.  Work with a physical therapist to develop an exercise plan that includes flexibility, balance, and strength exercises.  If you drink alcohol, limit how much you have. This means: ? 0-1 drink a Mareon Robinette for women. ? 0-2 drinks a Leelynn Whetsel for men. ? Be aware of how much alcohol is in your drink. In the [place] one drink equals one typical bottle of beer (12 oz), one-half glass of wine (5 oz), or one shot of hard liquor (1 oz). What foods should I eat? Foods high in calcium   Yogurt. Yogurt with fruit.  Milk. Evaporated skim milk. Dry milk powder.  Calcium-fortified orange juice.  Parmesan cheese. Part-skim ricotta cheese. Natural hard cheese. Cream cheese. Cottage cheese.  Canned sardines. Canned salmon.  Calcium-treated tofu. Calcium-fortified cereal bar. Calcium-fortified cereal. Calcium-fortified graham crackers.  Cooked collard greens. Turnip greens. Broccoli. Kale.  Almonds.  White beans.  Corn tortilla. Foods high in vitamin D  Cod liver oil. Fatty fish, such as tuna, mackerel, and salmon.  Milk. Fortified soy milk. Fortified fruit juice.  Yogurt. Margarine.  Egg yolks. Foods high in protein  Beef. Lamb. Pork tenderloin.  Chicken breast.  Tuna (canned). Fish fillet.  Tofu.  Soy beans (cooked). Soy  patty. Beans (canned or cooked).  Cottage cheese.  Yogurt.  Peanut butter.  Pumpkin seeds. Nuts. Sunflower seeds.  Hard cheese.  Milk or other milk products, such as soy milk. The items listed above may not be a complete list of foods and beverages you can eat. Contact a dietitian for more options. Summary  Calcium and vitamin D are nutrients that play an important role in bone health and are an important part of your eating plan for osteoporosis.  Eat balanced meals that contain protein foods, fruits and vegetables, and foods rich in calcium and  vitamin D.  Avoid foods that have more than 300 mg of sodium per serving. Too much sodium can cause your body to lose calcium.  Exercise is an important part of prevention and treatment of osteoporosis. Aim for at least 30 minutes a Charles Niese, 5 days a week. This information is not intended to replace advice given to you by your health care provider. Make sure you discuss any questions you have with your health care provider. Document Revised: 12/28/2017 Document Reviewed: 12/28/2017 Elsevier Patient Education  2020 [employer].

## 2020-05-10 DIAGNOSIS — H401122 Primary open-angle glaucoma, left eye, moderate stage: Secondary | ICD-10-CM | POA: Diagnosis not present

## 2020-05-10 DIAGNOSIS — H401111 Primary open-angle glaucoma, right eye, mild stage: Secondary | ICD-10-CM | POA: Diagnosis not present

## 2020-05-22 ENCOUNTER — Other Ambulatory Visit: Payer: Self-pay

## 2020-05-22 ENCOUNTER — Ambulatory Visit (INDEPENDENT_AMBULATORY_CARE_PROVIDER_SITE_OTHER): Payer: Medicare HMO | Admitting: Internal Medicine

## 2020-05-22 ENCOUNTER — Other Ambulatory Visit: Payer: Self-pay | Admitting: Internal Medicine

## 2020-05-22 ENCOUNTER — Encounter: Payer: Self-pay | Admitting: Internal Medicine

## 2020-05-22 VITALS — BP 105/75 | HR 91 | Temp 98.0°F | Resp 18 | Ht 65.0 in | Wt 194.1 lb

## 2020-05-22 DIAGNOSIS — E119 Type 2 diabetes mellitus without complications: Secondary | ICD-10-CM | POA: Diagnosis not present

## 2020-05-22 DIAGNOSIS — R221 Localized swelling, mass and lump, neck: Secondary | ICD-10-CM

## 2020-05-22 DIAGNOSIS — M810 Age-related osteoporosis without current pathological fracture: Secondary | ICD-10-CM | POA: Diagnosis not present

## 2020-05-22 DIAGNOSIS — E785 Hyperlipidemia, unspecified: Secondary | ICD-10-CM | POA: Diagnosis not present

## 2020-05-22 LAB — COMPREHENSIVE METABOLIC PANEL
ALT: 9 U/L (ref 0–35)
AST: 14 U/L (ref 0–37)
Albumin: 4.2 g/dL (ref 3.5–5.2)
Alkaline Phosphatase: 44 U/L (ref 39–117)
BUN: 16 mg/dL (ref 6–23)
CO2: 32 mEq/L (ref 19–32)
Calcium: 10 mg/dL (ref 8.4–10.5)
Chloride: 103 mEq/L (ref 96–112)
Creatinine, Ser: 0.77 mg/dL (ref 0.40–1.20)
GFR: 85.66 mL/min (ref >60.00)
Glucose, Bld: 125 mg/dL — ABNORMAL HIGH (ref 70–99)
Potassium: 4.6 mEq/L (ref 3.5–5.1)
Sodium: 141 mEq/L (ref 135–145)
Total Bilirubin: 0.7 mg/dL (ref 0.2–1.2)
Total Protein: 7 g/dL (ref 6.0–8.3)

## 2020-05-22 LAB — LIPID PANEL
Cholesterol: 159 mg/dL (ref 0–200)
HDL: 50.3 mg/dL (ref >39.00)
LDL Cholesterol: 74 mg/dL (ref 0–99)
NonHDL: 109.18
Total CHOL/HDL Ratio: 3
Triglycerides: 178 mg/dL — ABNORMAL HIGH (ref 0.0–149.0)
VLDL: 35.6 mg/dL (ref 0.0–40.0)

## 2020-05-22 LAB — HEMOGLOBIN A1C: Hgb A1c MFr Bld: 6.7 % — ABNORMAL HIGH (ref 4.6–6.5)

## 2020-05-22 NOTE — Progress Notes (Signed)
Pre visit review using our clinic review tool, if applicable. No additional management support is needed unless otherwise documented below in the visit note. 

## 2020-05-22 NOTE — Progress Notes (Signed)
Subjective:    Patient ID: [name], female    DOB: 28-Apr-1930, 86 y.o.   MRN: [id number]  DOS:  05/22/2020 Type of visit - description: Follow-up Since the last office visit started Fosamax and also increased the dose of Pravachol. Not checking ambulatory blood sugars Noted a couple lumps on the neck about a month ago.  Nontender, she is just curious about what they are  BP Readings from Last 3 Encounters:  05/22/20 105/75  04/11/20 120/62  11/22/19 122/68   Wt Readings from Last 3 Encounters:  05/22/20 194 lb 2 oz (88.1 kg)  04/11/20 196 lb 6.4 oz (89.1 kg)  11/22/19 193 lb (87.5 kg)     Review of Systems No fever chills  Past Medical History:  Diagnosis Date  . Abnormal breast exam 2004   MMG neg (eval by surgeon neg)  . Anemia   . Arthritis    "knees" (07/11/2014)  . History of blood transfusion    "related to a surgery, I think"  . Hx SBO 2004 , 2007, 2012    exploratory surgery 9-12   . Iritis 2005  . Osteoarthritis   . Osteopenia   . SBO (small bowel obstruction) ([employer]) 07-2014  . Type II diabetes mellitus ([place])     Past Surgical History:  Procedure Laterality Date  . APPENDECTOMY    . BACK SURGERY  01-2019   Bilaterally L4, L5 decompression  . CATARACT EXTRACTION Bilateral   . DILATION AND CURETTAGE OF UTERUS    . EXPLORATORY LAPAROTOMY  07/2011   SBO  . TOTAL ABDOMINAL HYSTERECTOMY  1970's   for bleeding, no cancer  . TOTAL HIP ARTHROPLASTY Right 06/23/2016   Procedure: TOTAL HIP ARTHROPLASTY ANTERIOR APPROACH;  Surgeon: Dorna Leitz, MD;  Location: [facility];  Service: Orthopedics;  Laterality: Right;  . TOTAL KNEE ARTHROPLASTY Right 10/2010    Allergies as of 05/22/2020      Reactions   Fentanyl Rash   Rash at injection site   Lipitor [atorvastatin] Other (See Comments)   tinnitus   Zocor [simvastatin] Other (See Comments)   Ear ringing      Medication List       Accurate as of May 22, 2020  1:12 PM. If you have any questions, ask your  nurse or doctor.        STOP taking these medications   cholecalciferol 1000 units tablet Commonly known as: VITAMIN D Stopped by: Kathlene November, MD   onetouch ultrasoft lancets Stopped by: Kathlene November, MD     TAKE these medications   acetaminophen 650 MG CR tablet Commonly known as: TYLENOL Take 650 mg by mouth every 8 (eight) hours as needed for pain.   alendronate 70 MG tablet Commonly known as: FOSAMAX Take 1 tablet (70 mg total) by mouth once a week. Take with a full glass of water on an empty stomach.   aspirin EC 81 MG tablet Take 81 mg by mouth daily.   CALCIUM/VITAMIN D PO Take by mouth. 2479m Calcium 10065mVitamin D   docusate sodium 100 MG capsule Commonly known as: COLACE Take 1 capsule (100 mg total) by mouth every 12 (twelve) hours.   Fish Oil 1000 MG Caps Take 1,000 mg by mouth daily.   glucose blood test strip Commonly known as: ONE TOUCH ULTRA TEST Check blood sugar no more than twice daily   metFORMIN 500 MG tablet Commonly known as: GLUCOPHAGE Take 1 tablet (500 mg total) by mouth 2 (two)  times daily with a meal.   multivitamin with minerals Tabs tablet Take 1 tablet by mouth daily.   ONE TOUCH DELICA LANCING DEV Misc To check blood sugars as directed   ONE TOUCH ULTRA 2 w/Device Kit Check blood sugar no more than twice daily   polyethylene glycol 17 g packet Commonly known as: MIRALAX / GLYCOLAX Take 17 g by mouth daily.   pravastatin 40 MG tablet Commonly known as: PRAVACHOL TAKE 1 TABLET BY MOUTH EVERYDAY AT BEDTIME What changed: See the new instructions. Changed by: Kathlene November, MD          Objective:   Physical Exam Neck:     BP 105/75 (BP Location: Left Arm, Patient Position: Sitting, Cuff Size: Small)   Pulse 91   Temp 98 F (36.7 C) (Oral)   Resp 18   Ht 5' 5"  (1.651 m)   Wt 194 lb 2 oz (88.1 kg)   SpO2 98%   BMI 32.30 kg/m   General:   Well developed, NAD, BMI noted. HEENT:  Normocephalic . Face symmetric,  atraumatic Lungs:  CTA B Normal respiratory effort, no intercostal retractions, no accessory muscle use. Heart: RRR,  no murmur.  Lower extremities: no pretibial edema bilaterally  Skin: Not pale. Not jaundice Neurologic:  alert & oriented X3.  Speech normal, gait appropriate for age and unassisted Psych--  Cognition and judgment appear intact.  Cooperative with normal attention span and concentration.  Behavior appropriate. No anxious or depressed appearing.      Assessment     Assessment DM  hyperlipidemia Osteopenia-- dexa 05-2015   T score -1.4 Osteoarthritis, chronic back pain -Dr Mina Marble note from 09/27/2018 : MRI  central canal stenosis, L4-L5,  local injections, no much help   - S/p surgery 01/2019: very good results. Iritis Multiple SBO, exploratory surgery 2012 Tinnitus chronic, c/o HOH: saw neuro 11/2017, MRI was done, no report, then she saw ENT (Rx conservative treatment)  PLAN: DM: Currently seems to be doing well on Metformin, unable to check CBGs due to d/t  discomfort.  Plan: Check a CMP, A1c. High cholesterol: Based on last FLP, Pravachol dose increased to 40 mg, check FLP. Osteoporosis: Last T score 01-2020 was -2.5, on Fosamax, good compliance, no apparent side effects. Neck gland: On exam, she has a structure on the right side of the neck, see graphic, suspect a salivary gland, recommend self examination, call if the area grows or changes. RTC 11-2020 (noting that she is probably moving to [place] in a couple of months)    This visit occurred during the SARS-CoV-2 public health emergency.  Safety protocols were in place, including screening questions prior to the visit, additional usage of staff PPE, and extensive cleaning of exam room while observing appropriate contact time as indicated for disinfecting solutions.

## 2020-05-22 NOTE — Patient Instructions (Addendum)
   GO TO THE LAB : Get the blood work     [place], PLEASE SCHEDULE YOUR APPOINTMENTS Come back for a physical exam by January 2022.  Call sooner if needed.

## 2020-05-23 NOTE — Assessment & Plan Note (Signed)
DM: Currently seems to be doing well on Metformin, unable to check CBGs due to d/t  discomfort.  Plan: Check a CMP, A1c. High cholesterol: Based on last FLP, Pravachol dose increased to 40 mg, check FLP. Osteoporosis: Last T score 01-2020 was -2.5, on Fosamax, good compliance, no apparent side effects. Neck gland: On exam, she has a structure on the right side of the neck, see graphic, suspect a salivary gland, recommend self examination, call if the area grows or changes. RTC 11-2020 (noting that she is probably moving to [place] in a couple of months)

## 2020-06-21 DIAGNOSIS — H04123 Dry eye syndrome of bilateral lacrimal glands: Secondary | ICD-10-CM | POA: Diagnosis not present

## 2020-06-21 DIAGNOSIS — H401132 Primary open-angle glaucoma, bilateral, moderate stage: Secondary | ICD-10-CM | POA: Diagnosis not present

## 2020-06-21 DIAGNOSIS — Z961 Presence of intraocular lens: Secondary | ICD-10-CM | POA: Diagnosis not present

## 2020-06-21 DIAGNOSIS — H35371 Puckering of macula, right eye: Secondary | ICD-10-CM | POA: Diagnosis not present

## 2020-06-21 DIAGNOSIS — H43822 Vitreomacular adhesion, left eye: Secondary | ICD-10-CM | POA: Diagnosis not present

## 2020-07-02 ENCOUNTER — Other Ambulatory Visit: Payer: Self-pay | Admitting: Internal Medicine

## 2020-07-13 ENCOUNTER — Telehealth: Payer: Self-pay | Admitting: Internal Medicine

## 2020-07-13 ENCOUNTER — Ambulatory Visit: Payer: Medicare HMO | Admitting: Pharmacist

## 2020-07-13 DIAGNOSIS — E119 Type 2 diabetes mellitus without complications: Secondary | ICD-10-CM

## 2020-07-13 DIAGNOSIS — E785 Hyperlipidemia, unspecified: Secondary | ICD-10-CM

## 2020-07-13 NOTE — Chronic Care Management (AMB) (Signed)
Chronic Care Management Pharmacy  Name: [name]  MRN: [id number] DOB: [birth year]   Chief Complaint/ HPI  Quincy Carnes,  86 y.o. , female presents for their Follow-Up CCM visit with the clinical pharmacist In office (scheduled as phone visit, but patient came to office).  PCP : Colon Branch, MD  Their chronic conditions include: Diabetes, Hyperlipidemia, Osteoporosis, Osteoarthritis  Office Visits: 05/22/20: Visit w/ Dr. Larose Kells - No med changes noted  Consult Visit: None since last CCM visit on 04/11/20.   Medications: Outpatient Encounter Medications as of 07/13/2020  Medication Sig  . acetaminophen (TYLENOL) 650 MG CR tablet Take 650 mg by mouth every 8 (eight) hours as needed for pain.   Marland Kitchen alendronate (FOSAMAX) 70 MG tablet Take 1 tablet (70 mg total) by mouth once a week. Take with a full glass of water on an empty stomach.  Marland Kitchen aspirin EC 81 MG tablet Take 81 mg by mouth daily.  . Blood Glucose Monitoring Suppl (ONE TOUCH ULTRA 2) w/Device KIT Check blood sugar no more than twice daily (Patient not taking: Reported on 05/22/2020)  . Calcium Carb-Cholecalciferol (CALCIUM/VITAMIN D PO) Take by mouth. 2481m Calcium 10013mVitamin D  . docusate sodium (COLACE) 100 MG capsule Take 1 capsule (100 mg total) by mouth every 12 (twelve) hours. (Patient not taking: Reported on 08/18/2019)  . glucose blood (ONE TOUCH ULTRA TEST) test strip Check blood sugar no more than twice daily (Patient not taking: Reported on 05/22/2020)  . Lancet Devices (ONE TOUCH DELICA LANCING DEV) MISC To check blood sugars as directed (Patient not taking: Reported on 05/22/2020)  . metFORMIN (GLUCOPHAGE) 500 MG tablet Take 1 tablet (500 mg total) by mouth 2 (two) times daily with a meal.  . Multiple Vitamin (MULTIVITAMIN WITH MINERALS) TABS tablet Take 1 tablet by mouth daily.  . Omega-3 Fatty Acids (FISH OIL) 1000 MG CAPS Take 1,000 mg by mouth daily.   . polyethylene glycol (MIRALAX / GLYCOLAX) 17 g packet  Take 17 g by mouth daily. (Patient not taking: Reported on 08/18/2019)  . pravastatin (PRAVACHOL) 40 MG tablet TAKE 1 TABLET BY MOUTH EVERYDAY AT BEDTIME   No facility-administered encounter medications on file as of 07/13/2020.   SDOH Screenings   Alcohol Screen:   . Last Alcohol Screening Score (AUDIT): Not on file  Depression (PHQ2-9): Low Risk   . PHQ-2 Score: 0  Financial Resource Strain: Low Risk   . Difficulty of Paying Living Expenses: Not very hard  Food Insecurity:   . Worried About RuCharity fundraisern the Last Year: Not on file  . Ran Out of Food in the Last Year: Not on file  Housing:   . Last Housing Risk Score: Not on file  Physical Activity:   . Days of Exercise per Week: Not on file  . Minutes of Exercise per Session: Not on file  Social Connections:   . Frequency of Communication with Friends and Family: Not on file  . Frequency of Social Gatherings with Friends and Family: Not on file  . Attends Religious Services: Not on file  . Active Member of Clubs or Organizations: Not on file  . Attends ClArchivisteetings: Not on file  . Marital Status: Not on file  Stress:   . Feeling of Stress : Not on file  Tobacco Use: Medium Risk  . Smoking Tobacco Use: Former Smoker  . Smokeless Tobacco Use: Never Used  Transportation Needs:   . Lack of  Transportation (Medical): Not on file  . Lack of Transportation (Non-Medical): Not on file     Current Diagnosis/Assessment:  Goals Addressed            This Visit's Progress   . Chronic Care Management Pharmacy Care Plan       CARE PLAN ENTRY (see longitudinal plan of care for additional care plan information)  Current Barriers:  . Chronic Disease Management support, education, and care coordination needs related to Diabetes, Hyperlipidemia, Osteoporosis, Osteoarthritis    Hyperlipidemia Lab Results  Component Value Date/Time   LDLCALC 74 05/22/2020 01:34 PM   . Pharmacist Clinical Goal(s): o Over  the next 180 days, patient will work with PharmD and providers to maintain LDL goal < 100 . Current regimen:  o Pravastatin 47m daily . Patient self care activities - Over the next 180 days, patient will: o Maintain cholesterol medication regimen.   Diabetes Lab Results  Component Value Date/Time   HGBA1C 6.7 (H) 05/22/2020 01:34 PM   HGBA1C 6.5 08/18/2019 11:50 AM   . Pharmacist Clinical Goal(s): o Over the next 180 days, patient will work with PharmD and providers to maintain A1c goal <7% . Current regimen:  o Metformin 5032mtwice daily . Patient self care activities - Over the next 180 days, patient will: o Maintain a1c <7%  Medication management . Pharmacist Clinical Goal(s): o Over the next 180 days, patient will work with PharmD and providers to maintain optimal medication adherence . [facility] . Interventions o Comprehensive medication review performed. o Continue current medication management strategy . Patient self care activities - Over the next 180 days, patient will: o Focus on medication adherence by filling and taking medications appropriately  o Take medications as prescribed o Report any questions or concerns to PharmD and/or provider(s)  Please see past updates related to this goal by clicking on the "Past Updates" button in the selected goal      Social Hx:  Patient is widowed of 20 years. Lives alone. [place] grand, great-grand, and great-great-grandchildren.  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  She would like resources that could help her with cleaning her home.  Update  [place].   Diabetes   Recent Relevant Labs: Lab Results  Component Value Date/Time   HGBA1C 6.7 (H)  05/22/2020 01:34 PM   HGBA1C 6.5 08/18/2019 11:50 AM   MICROALBUR <0.7 11/22/2019 02:57 PM   MICROALBUR 0.8 11/17/2018 02:10 PM    A1c goal <7%  Kidney Function Lab Results  Component Value Date/Time   CREATININE 0.77 05/22/2020 01:34 PM   CREATININE 0.75 11/22/2019 02:57 PM   CREATININE 0.72 12/05/2016 02:59 PM   GFR 85.66 05/22/2020 01:34 PM   GFRNONAA >60 05/19/2019 07:38 AM   GFRAA >60 05/19/2019 07:38 AM   Patient has failed these meds in past: None noted  Patient is currently controlled on the following medications:   Metformin 50054mwice daily  She states she keeps her sugars down and eats appropriately.  Last diabetic Eye exam:  Lab Results  Component Value Date/Time   HMDIABEYEEXA No Retinopathy 11/10/2019 12:00 AM    Last diabetic Foot exam:  Lab Results  Component Value Date/Time   HMDIABFOOTEX Done/Dr.  Paz 05/22/2015 12:00 AM    Update 07/13/20 A1c stable, but trending up  Plan -Continue current medications    Hyperlipidemia   Lipid Panel     Component Value Date/Time   CHOL 159 05/22/2020 1334   TRIG 178.0 (H) 05/22/2020 1334   HDL 50.30 05/22/2020 1334   LDLCALC 74 05/22/2020 1334    LDL goal <100  The ASCVD Risk score (Felton., et al., 2013) failed to calculate for the following reasons:   The 2013 ASCVD risk score is only valid for ages 70 to 22   Patient has failed these meds in past: atorvastatin, simvastatin (tinnitus), pitavastatin (cost), ezetimibe (intolerable) Patient is currently controlled on the following medications:   Pravastatin 28m daily  Update 07/13/20 Lipid panel improved from previous. Congratulated patient on this Stopped eating cheese. Chose a chickpea substitute from [employer] Watched what she ate.    Plan -Continue current medications    Osteoporosis   Last DEXA Scan: 01/24/2020  T-Score femoral neck: -0.7  T-Score lumbar spine: 0.6  T-Score forearm radius: -2.5  Vit D, 25-Hydroxy  Date Value Ref  Range Status  04/25/2010 30 30 - 89 ng/mL Final    Comment:    See lab report for associated comment(s)     Patient has failed these meds in past: None noted  Patient is currently uncontrolled on the following medications:  Alendronate 71mweekly on Wed, vitamin D 1000 units daily  We discussed:  Recommend 1200 mg of calcium daily from dietary and supplemental sources.   Update 07/13/20 Increase calcium to 120051mYes Did increase calcium via supplementation and diet. Believes she is getting 1200m11mily.  Plan  -Continue current medications    KaneDe BlancharmD Clinical Pharmacist [phone] Meds  Aspirin 81mg32mltivitamin  Meds to D/C from list  Docusate Miralax Fish Oil

## 2020-07-13 NOTE — Telephone Encounter (Signed)
Updated patient address per request of pharmacist.

## 2020-07-13 NOTE — Patient Instructions (Signed)
Visit Information  Goals Addressed            This Visit's Progress    Chronic Care Management Pharmacy Care Plan       CARE PLAN ENTRY (see longitudinal plan of care for additional care plan information)  Current Barriers:   Chronic Disease Management support, education, and care coordination needs related to Diabetes, Hyperlipidemia, Osteoporosis, Osteoarthritis    Hyperlipidemia Lab Results  Component Value Date/Time   [facility] 74 05/22/2020 01:34 PM    Pharmacist Clinical Goal(s): o Over the next 180 days, patient will work with PharmD and providers to maintain LDL goal < 100  Current regimen:  o Pravastatin 40mg  daily  Patient self care activities - Over the next 180 days, patient will: o Maintain cholesterol medication regimen.   Diabetes Lab Results  Component Value Date/Time   HGBA1C 6.7 (H) 05/22/2020 01:34 PM   HGBA1C 6.5 08/18/2019 11:50 AM    Pharmacist Clinical Goal(s): o Over the next 180 days, patient will work with PharmD and providers to maintain A1c goal <7%  Current regimen:  o Metformin 500mg  twice daily  Patient self care activities - Over the next 180 days, patient will: o Maintain a1c <7%  Medication management  Pharmacist Clinical Goal(s): o Over the next 180 days, patient will work with PharmD and providers to maintain optimal medication adherence  Current pharmacy: [facility]  Interventions o Comprehensive medication review performed. o Continue current medication management strategy  Patient self care activities - Over the next 180 days, patient will: o Focus on medication adherence by filling and taking medications appropriately  o Take medications as prescribed o Report any questions or concerns to PharmD and/or provider(s)  Please see past updates related to this goal by clicking on the "Past Updates" button in the selected goal        The patient verbalized understanding of instructions provided today and agreed to receive a  mailed copy of patient instruction and/or educational materials.   De Blanch, PharmD Clinical Pharmacist [facility] [phone]

## 2020-10-08 ENCOUNTER — Other Ambulatory Visit: Payer: Self-pay | Admitting: Internal Medicine

## 2020-11-18 ENCOUNTER — Other Ambulatory Visit: Payer: Self-pay | Admitting: Internal Medicine

## 2020-12-28 ENCOUNTER — Other Ambulatory Visit: Payer: Self-pay | Admitting: Internal Medicine

## 2021-02-28 ENCOUNTER — Encounter
Admit: 2021-02-28 | Discharge: 2021-02-28 | Payer: MEDICARE | Primary: Student in an Organized Health Care Education/Training Program

## 2021-02-28 ENCOUNTER — Ambulatory Visit
Admit: 2021-02-28 | Discharge: 2021-02-28 | Payer: MEDICARE | Primary: Student in an Organized Health Care Education/Training Program

## 2021-02-28 DIAGNOSIS — M270 Developmental disorders of jaws: Secondary | ICD-10-CM

## 2021-02-28 DIAGNOSIS — D649 Anemia, unspecified: Secondary | ICD-10-CM

## 2021-02-28 DIAGNOSIS — E119 Type 2 diabetes mellitus without complications: Secondary | ICD-10-CM

## 2021-02-28 DIAGNOSIS — M810 Age-related osteoporosis without current pathological fracture: Secondary | ICD-10-CM

## 2021-02-28 DIAGNOSIS — Z7689 Persons encountering health services in other specified circumstances: Secondary | ICD-10-CM

## 2021-02-28 LAB — COMPREHENSIVE METABOLIC PANEL
ALBUMIN: 4.5 g/dL — ABNORMAL HIGH (ref 3.5–5.0)
ALT: 14 U/L (ref 7–56)
AST: 18 U/L (ref 7–40)
CALCIUM: 9.8 mg/dL (ref 8.5–10.6)
CHLORIDE: 103 MMOL/L (ref 98–110)
CO2: 28 MMOL/L (ref 21–30)
CREATININE: 0.8 mg/dL (ref 0.4–1.00)
EGFR: >60 mL/min (ref >60)
GLUCOSE,PANEL: 82 mg/dL (ref 70–100)
POTASSIUM: 5.1 MMOL/L (ref 3.5–5.1)
SODIUM: 144 MMOL/L — ABNORMAL LOW (ref 137–147)
TOTAL BILIRUBIN: 0.7 mg/dL (ref 0.3–1.2)
TOTAL PROTEIN: 7.5 g/dL (ref 6.0–8.0)

## 2021-02-28 LAB — CBC AND DIFF
ABSOLUTE BASO COUNT: 0 K/UL (ref 0–0.20)
ABSOLUTE EOS COUNT: 0 K/UL (ref 0–0.45)
ABSOLUTE LYMPH COUNT: 3.9 K/UL — ABNORMAL HIGH (ref 1.0–4.8)
MCHC: 32 g/dL (ref 32.0–36.0)
MONOCYTES %: 6 % (ref 4–12)
RBC COUNT: 4.2 M/UL (ref <100)
WBC COUNT: 8.1 K/UL (ref >40)

## 2021-02-28 LAB — LIPID PROFILE
CHOLESTEROL: 173 mg/dL (ref <200)
TRIGLYCERIDES: 101 mg/dL (ref <150)

## 2021-02-28 LAB — MICROALBUMIN-URINE RANDOM: MICROALBUMIN, RAN: 13 ug/mL

## 2021-02-28 NOTE — Progress Notes
Date of Service: 02/28/2021    Krystal Sullivan is a 87 y.o. female.  DOB: Feb 12, 1930  MRN: [id number]   Chief Complaint   Patient presents with   ? Establish Care       Subjective:             Patient Reported Other  What medical problem brings you in to see the provider? establish care.  Pertinent negatives include no chest pain, chills, coughing or fever.     DM2  -need referral to ophthalmology  -controlled with diet, metformin  -has not been testing at home  -Previous records reviewed in care everywhere, hemoglobin A1c has been under 7 since 2017    Osteoporosis  -was on fosamax  -stopped due to concerns regarding medication safety  -on calcium, vit d, mg supplement       Home: self  Active in church. Goes to conventions (going to [place], [place])  Diet: varied, eats vegetables. Limited fats. Drinks water.   Ambulates with cane.  Exercise: walks on nice day  Stress: sleeps  Sleeps: no concerns.    No past medical history on file.  Surgical History:   Procedure Laterality Date   ? KNEE REPLACEMENT Right 2011   ? ABDOMINAL EXPLORATION SURGERY  2012    SBO   ? BACK SURGERY  2020    Bilaterally L4, L5 decompression    ? APPENDECTOMY     ? HIP REPLACEMENT Right    ? HYSTERECTOMY      for AUB     Family History   Problem Relation Age of Onset   ? Diabetes Type II Father    ? Alcohol abuse Son    ? Kidney Failure Daughter         renal failure     Social History     Socioeconomic History   ? Marital status: Widowed     Spouse name: Not on file   ? Number of children: Not on file   ? Years of education: Not on file   ? Highest education level: Not on file   Occupational History   ? Not on file   Tobacco Use   ? Smoking status: Never Smoker   ? Smokeless tobacco: Never Used   Substance and Sexual Activity   ? Alcohol use: Never   ? Drug use: Never   ? Sexual activity: Not Currently     Partners: Male   Other Topics Concern   ? Not on file   Social History Narrative   ? Not on file                      Review of Systems Constitutional: Negative for chills and fever.   Respiratory: Negative for cough and shortness of breath.    Cardiovascular: Negative for chest pain and palpitations.   Gastrointestinal: Negative for blood in stool, constipation and diarrhea.   Psychiatric/Behavioral: Negative for sleep disturbance.         Objective:         ? acetaminophen SR (TYLENOL) 650 mg tablet Take 650 mg by mouth.   ? aspirin EC 81 mg tablet Take 81 mg by mouth daily.   ? blood sugar diagnostic (ONETOUCH ULTRA TEST) test strip Check blood sugar no more than twice daily   ? Blood-Glucose Meter (ONETOUCH ULTRA2 METER) kit Check blood sugar no more than twice daily   ? latanoprost (XALATAN) 0.005 % ophthalmic solution INSTILL 1 DROP INTO  BOTH EYES AT BEDTIME   ? metFORMIN (GLUCOPHAGE) 500 mg tablet Take 500 mg by mouth twice daily after meals.   ? pravastatin (PRAVACHOL) 40 mg tablet Take 40 mg by mouth at bedtime daily.     Vitals:    02/28/21 1345   BP: 104/70   BP Source: Arm, Right Upper   Pulse: 83   Temp: 36.4 ?C (97.5 ?F)   Resp: 16   SpO2: 99%   TempSrc: Temporal   PainSc: Zero   Weight: 84.9 kg (187 lb 1.6 oz)   Height: 160 cm (5' 3)     Body mass index is 33.14 kg/m?Marland Kitchen     Physical Exam  Vitals reviewed.   Constitutional:       Appearance: Normal appearance.   HENT:      Head: Normocephalic and atraumatic.      Right Ear: Tympanic membrane, ear canal and external ear normal.      Left Ear: Tympanic membrane, ear canal and external ear normal.      Nose: Nose normal.      Mouth/Throat:      Comments: mandibular tori present  Cardiovascular:      Rate and Rhythm: Normal rate and regular rhythm.      Pulses:           Dorsalis pedis pulses are 1+ on the right side and 1+ on the left side.        Posterior tibial pulses are 1+ on the right side and 1+ on the left side.      Heart sounds: Normal heart sounds.   Pulmonary:      Effort: Pulmonary effort is normal. No respiratory distress.      Breath sounds: Normal breath sounds. Musculoskeletal:      Cervical back: Normal range of motion and neck supple. No rigidity.      Right lower leg: No edema.      Left lower leg: No edema.      Right foot: Normal range of motion. No deformity, bunion, Charcot foot, foot drop or prominent metatarsal heads.      Left foot: Normal range of motion. No deformity, bunion, Charcot foot, foot drop or prominent metatarsal heads.   Feet:      Right foot:      Protective Sensation: 8 sites tested. 8 sites sensed.      Skin integrity: Skin integrity normal.      Toenail Condition: Right toenails are normal.      Left foot:      Protective Sensation: 8 sites tested. 8 sites sensed.      Skin integrity: Skin integrity normal.      Toenail Condition: Left toenails are normal.   Skin:     General: Skin is warm and dry.   Neurological:      Mental Status: She is alert.   Psychiatric:         Mood and Affect: Mood normal.         Thought Content: Thought content normal.              Assessment and Plan:    Problem   Age-Related Osteoporosis Without Current Pathological Fracture   Torus Mandibularis   Dyslipidemia    Formatting of this note might be different from the original.  Qualifier: Diagnosis of   By: Drue Novel MD, Nolon Rod.      Last Assessment & Plan:   Formatting of this note might be different from  the original.  Intolerant to simvastatin, Lipitor and Zetia     Osteoarthritis    Formatting of this note might be different from the original.  Qualifier: Diagnosis of   By: Drue Novel MD, Nolon Rod.      Last Assessment & Plan:   Formatting of this note might be different from the original.  Shoulder pain: Saw sports medicine, diagnosed with rotator cuff impingement, on diclofenac, not improving much, rec to go back to [facility] if needed     Dmii (Diabetes Mellitus, Type 2) (Hcc)    Formatting of this note might be different from the original.        Last Assessment & Plan:   Formatting of this note might be different from the original.  Currently on metformin, check A1c and microalbumin.  Feet exam normal today       Krystal Sullivan was seen today for establish care.    Diagnoses and all orders for this visit:    Encounter to establish care    Age-related osteoporosis without current pathological fracture    Type 2 diabetes mellitus without complication, without long-term current use of insulin (HCC)  -     AMB REFERRAL TO OPHTHALMOLOGY  -     HEMOGLOBIN A1C; Future; Expected date: 02/28/2021  -     MICROALBUMIN-URINE RANDOM; Future; Expected date: 02/28/2021  -     LIPID PROFILE; Future; Expected date: 02/28/2021  -     CBC AND DIFF; Future; Expected date: 02/28/2021  -     COMPREHENSIVE METABOLIC PANEL; Future; Expected date: 02/28/2021    Low hemoglobin  -     CBC AND DIFF; Future; Expected date: 02/28/2021    Torus mandibularis  -     AMB REFERRAL TO SOCIAL WORK    Other orders  -     COVID-19 (PFIZER), MRNA, LNP-S, 30 MCG/0.3 ML (PF)                        Patient Instructions   Recommendations:    Tai [employer] for Better Balance - SilverSneakers on YouTube    Get lab drawn today. We will call you with results.              Return in about 5 months (around 07/31/2021) for Annual Wellness Visit.

## 2021-03-01 ENCOUNTER — Encounter
Admit: 2021-03-01 | Discharge: 2021-03-01 | Payer: MEDICARE | Primary: Student in an Organized Health Care Education/Training Program

## 2021-03-04 ENCOUNTER — Encounter
Admit: 2021-03-04 | Discharge: 2021-03-04 | Payer: MEDICARE | Primary: Student in an Organized Health Care Education/Training Program

## 2021-03-14 ENCOUNTER — Encounter
Admit: 2021-03-14 | Discharge: 2021-03-14 | Payer: MEDICARE | Primary: Student in an Organized Health Care Education/Training Program

## 2021-03-14 MED ORDER — METFORMIN 500 MG PO TAB
500 mg | ORAL_TABLET | Freq: Two times a day (BID) | ORAL | 0 refills | Status: AC
Start: 2021-03-14 — End: ?

## 2021-03-14 NOTE — Telephone Encounter
Pt called in and left VM requesting refill on medication. Pt states she has not had medication filled by this office or provider yet and needs to initiate refills.

## 2021-03-16 ENCOUNTER — Other Ambulatory Visit: Payer: Self-pay | Admitting: Internal Medicine

## 2021-03-19 ENCOUNTER — Encounter
Admit: 2021-03-19 | Discharge: 2021-03-19 | Payer: MEDICARE | Primary: Student in an Organized Health Care Education/Training Program

## 2021-04-18 ENCOUNTER — Other Ambulatory Visit: Payer: Self-pay | Admitting: Internal Medicine

## 2021-05-04 ENCOUNTER — Other Ambulatory Visit: Payer: Self-pay | Admitting: Internal Medicine

## 2021-05-14 ENCOUNTER — Encounter
Admit: 2021-05-14 | Discharge: 2021-05-14 | Payer: MEDICARE | Primary: Student in an Organized Health Care Education/Training Program

## 2021-05-14 MED ORDER — PRAVASTATIN 40 MG PO TAB
40 mg | ORAL_TABLET | Freq: Every evening | ORAL | 0 refills | 90.00000 days | Status: AC
Start: 2021-05-14 — End: ?

## 2021-05-16 ENCOUNTER — Encounter: Admit: 2021-05-16 | Discharge: 2021-05-16 | Payer: MEDICARE

## 2021-05-16 ENCOUNTER — Ambulatory Visit: Admit: 2021-05-16 | Discharge: 2021-05-17 | Payer: MEDICARE

## 2021-05-16 DIAGNOSIS — H401190 Primary open-angle glaucoma, unspecified eye, stage unspecified: Secondary | ICD-10-CM | POA: Insufficient documentation

## 2021-05-16 DIAGNOSIS — H52203 Unspecified astigmatism, bilateral: Secondary | ICD-10-CM

## 2021-05-16 DIAGNOSIS — E119 Type 2 diabetes mellitus without complications: Secondary | ICD-10-CM

## 2021-05-16 DIAGNOSIS — Z961 Presence of intraocular lens: Secondary | ICD-10-CM

## 2021-05-16 NOTE — Progress Notes
Body mass index is 33.13 kg/m.              Assessment and Plan:    Problem   Poag (Primary Open-Angle Glaucoma)   Pseudophakia of Both Eyes   Astigmatism With Presbyopia, Bilateral   Dmii (Diabetes Mellitus, Type 2) (Hcc)    Currently on metformin, check A1c and microalbumin.  Feet exam normal today         POAG (primary open-angle glaucoma)  Diagnosed previously outside [place]  On latanoprost qhs OU  Will have her establish with [facility] service for ongoing care    Pseudophakia of both eyes  Stable, well-centered, no significant PCO, monitor yearly    Astigmatism with presbyopia, bilateral  Change found today with improved vision  Updated glasses prescription given at today's exam    DMII (diabetes mellitus, type 2) ([place])  Does the patient have Diabetes? Yes,  however the patient did not have a retinal exam today.      CONSULT WITH [place] (DR Glennon Hamilton) X 6 WEEKS      Next Optometry visit -- yearly  Test  Comments   MRx  _0     Galilei  _1     OCT  _2  Mac  _3  Nerve  _4  Ant seg    TOSM _5     Schirmer  _6  1 Drop of Proparacaine   _7  No numbing drops    IOP  _8  Tono   _9  iCare   _10  Applanate    Pachy  _11     HVF  _12  Sita Fast  _13  24-2       _14  Standard  _15  10-2         Optos Photos  _16         Dilate  _17 1 drop Tropicamide 1% &  Phenylephrine 2.5%   _18  Other (see comments)

## 2021-05-16 NOTE — Assessment & Plan Note
Diagnosed previously outside [place]  On latanoprost qhs OU  Will have her establish with [facility] service for ongoing care

## 2021-05-16 NOTE — Assessment & Plan Note
Change found today with improved vision  Updated glasses prescription given at today's exam

## 2021-05-16 NOTE — Assessment & Plan Note
Does the patient have Diabetes? Yes,  however the patient did not have a retinal exam today.

## 2021-05-16 NOTE — Assessment & Plan Note
Stable, well-centered, no significant PCO, monitor yearly

## 2021-05-17 DIAGNOSIS — E119 Type 2 diabetes mellitus without complications: Secondary | ICD-10-CM

## 2021-05-28 ENCOUNTER — Encounter: Admit: 2021-05-28 | Discharge: 2021-05-28 | Payer: MEDICARE

## 2021-06-10 ENCOUNTER — Other Ambulatory Visit: Payer: Self-pay | Admitting: Internal Medicine

## 2021-06-12 ENCOUNTER — Encounter: Admit: 2021-06-12 | Discharge: 2021-06-12 | Payer: MEDICARE

## 2021-06-14 ENCOUNTER — Encounter: Admit: 2021-06-14 | Discharge: 2021-06-14 | Payer: MEDICARE

## 2021-06-14 ENCOUNTER — Ambulatory Visit: Admit: 2021-06-14 | Discharge: 2021-06-14 | Payer: MEDICARE

## 2021-06-14 DIAGNOSIS — E119 Type 2 diabetes mellitus without complications: Secondary | ICD-10-CM

## 2021-06-14 DIAGNOSIS — M545 Acute midline low back pain without sciatica: Secondary | ICD-10-CM

## 2021-06-14 DIAGNOSIS — E785 Hyperlipidemia, unspecified: Secondary | ICD-10-CM

## 2021-06-14 DIAGNOSIS — H401190 Primary open-angle glaucoma, unspecified eye, stage unspecified: Secondary | ICD-10-CM

## 2021-06-14 MED ORDER — MELOXICAM 7.5 MG PO TAB
7.5 mg | ORAL_TABLET | Freq: Every day | ORAL | 0 refills | 30.00000 days | Status: AC
Start: 2021-06-14 — End: ?

## 2021-06-14 MED ORDER — PRAVASTATIN 40 MG PO TAB
40 mg | ORAL_TABLET | Freq: Every evening | ORAL | 0 refills | 90.00000 days | Status: AC
Start: 2021-06-14 — End: ?

## 2021-06-14 MED ORDER — METFORMIN 500 MG PO TAB
500 mg | ORAL_TABLET | Freq: Two times a day (BID) | ORAL | 0 refills | Status: AC
Start: 2021-06-14 — End: ?

## 2021-06-14 NOTE — Progress Notes
Date of Service: 06/14/2021    Krystal Sullivan is a 87 y.o. female.  DOB: 11-Aug-1930  MRN: [id number]     Subjective:             History of Present Illness  Chief Complaint   Patient presents with   ? Back Pain     [name] presents today with concerns of low back pain. She reports that she moved to the area in September to live with her son. She had 20 steps to go up to her bedroom at her son's house and feels like that is when the pain started for her. She moved out in February to her own home, which is a single story and easier to move around in. She does feel like that helped, but she continues to struggle with some low back pain, especially when she bends over forward. She is also now having some pain in her left knee, but she does not want to do anything about that. She reports that the pain is pretty sharp in her low back when it occurs. She can get relief by standing upright and sitting down. She has not taken anything for the pain, as she was not sure what she should take. She does have a history of back surgery in 2020, which she reports was a fusion of her L3-L5. She reports that she had plates and screws place and that they were never removed. She just wants to make sure everything is OK in her back. She is willing to start PT, but reports that she has no transportation, so she cannot leave her home to do therapy.       Review of Systems   Constitutional: Negative for unexpected weight change.   Respiratory: Negative for shortness of breath and wheezing.    Cardiovascular: Negative for chest pain and palpitations.   Musculoskeletal: Positive for back pain and gait problem.   Psychiatric/Behavioral: Negative for behavioral problems.         Objective:         ? acetaminophen SR (TYLENOL) 650 mg tablet Take 650 mg by mouth.   ? aspirin EC 81 mg tablet Take 81 mg by mouth daily.   ? blood sugar diagnostic (ONETOUCH ULTRA TEST) test strip Check blood sugar no more than twice daily   ? Blood-Glucose Meter kit Check blood sugar no more than twice daily   ? latanoprost (XALATAN) 0.005 % ophthalmic solution INSTILL 1 DROP INTO BOTH EYES AT BEDTIME   ? meloxicam (MOBIC) 7.5 mg tablet Take one tablet by mouth daily for 30 days.   ? metFORMIN (GLUCOPHAGE) 500 mg tablet Take one tablet by mouth twice daily after meals.   ? pravastatin (PRAVACHOL) 40 mg tablet Take one tablet by mouth at bedtime daily.     Vitals:    06/14/21 1116   BP: 100/64   BP Source: Arm, Left Upper   Pulse: 77   Temp: 36.2 ?C (97.2 ?F)   Resp: 16   SpO2: 95%   TempSrc: Temporal   PainSc: Four   Weight: 85.2 kg (187 lb 14.4 oz)   Height: 160 cm (5' 2.99)     Body mass index is 33.29 kg/m?Marland Kitchen     Physical Exam  Vitals reviewed.   Constitutional:       Appearance: Normal appearance.   HENT:      Head: Normocephalic and atraumatic.      Nose: Nose normal.   Eyes:  Extraocular Movements: Extraocular movements intact.      Conjunctiva/sclera: Conjunctivae normal.   Cardiovascular:      Rate and Rhythm: Normal rate and regular rhythm.      Heart sounds: Normal heart sounds.   Pulmonary:      Effort: Pulmonary effort is normal.      Breath sounds: Normal breath sounds.   Musculoskeletal:      Cervical back: Neck supple.      Lumbar back: Tenderness (right SI joint) present. No swelling, edema, deformity or lacerations. Decreased range of motion. No scoliosis.      Comments: There is a linear surgical incision to the lumbar region which is well healed   Skin:     General: Skin is warm and dry.   Neurological:      General: No focal deficit present.      Mental Status: She is alert and oriented to person, place, and time.   Psychiatric:         Mood and Affect: Mood normal.         Behavior: Behavior normal.         Thought Content: Thought content normal.         Judgment: Judgment normal.              Assessment and Plan:    1. Acute midline low back pain without sciatica  Since Shaniece has had a spinal surgery, we will obtain x-ray films today and will call her Monday with results. I am going to start her on a daily NSAID to see if that will help some of her pain. If x-rays look OK, then we can start some PT and will look into home PT that can come to her. She agrees with POC.  - L SPINE AP & LATERAL; Future  - meloxicam (MOBIC) 7.5 mg tablet; Take one tablet by mouth daily for 30 days.  Dispense: 30 tablet; Refill: 0    2. Type 2 diabetes mellitus without complication, without long-term current use of insulin (HCC)  A1c is stable and at goal, follow-up in 3 months. Continue current dose of metformin.  - POC HEMOGLOBIN A1C    3. Dyslipidemia  Cholesterol has been stable, will refill pravastatin and follow-up in 3 months fasting for repeat labs.  - pravastatin (PRAVACHOL) 40 mg tablet; Take one tablet by mouth at bedtime daily.  Dispense: 90 tablet; Refill: 0        There are no Patient Instructions on file for this visit.  Future Appointments   Date Time Provider Department Center   06/14/2021  4:00 PM [facility]   07/23/2021  7:30 AM [facility]   07/23/2021  7:45 AM Desma Mcgregor, MD [facility]

## 2021-06-20 ENCOUNTER — Encounter: Admit: 2021-06-20 | Discharge: 2021-06-20 | Payer: MEDICARE

## 2021-07-02 ENCOUNTER — Ambulatory Visit: Admit: 2021-07-02 | Discharge: 2021-07-02 | Payer: MEDICARE

## 2021-07-02 ENCOUNTER — Encounter: Admit: 2021-07-02 | Discharge: 2021-07-02 | Payer: MEDICARE

## 2021-07-02 DIAGNOSIS — M25512 Pain in left shoulder: Secondary | ICD-10-CM

## 2021-07-02 DIAGNOSIS — E119 Type 2 diabetes mellitus without complications: Secondary | ICD-10-CM

## 2021-07-02 DIAGNOSIS — H401190 Primary open-angle glaucoma, unspecified eye, stage unspecified: Secondary | ICD-10-CM

## 2021-07-02 NOTE — Progress Notes
Date of Service: 07/02/2021    Krystal Sullivan is a 87 y.o. female.  DOB: 1930/02/06  MRN: [id number]     Subjective:             History of Present Illness  Krystal Sullivan has had left neck and shoulder pain for the past 2 weeks. She denies nay chest pain or pressure and has no SOB. She feels like her left shoulder does not want to work at times, but she has no injury that she knows of. She recently had an x-ray of her spine and this showed arthritic changes, they ordered NSAIDS, but were unable to reach her for follow up per the chart notes. She has no history of heart disease. She has been taking Meloxicam for arthritic pain in general        Review of Systems   Constitutional: Negative.    HENT: Negative.    Eyes: Negative.    Respiratory: Negative.    Cardiovascular: Negative.    Gastrointestinal: Negative.    Endocrine: Negative.    Genitourinary: Negative.    Musculoskeletal: Positive for arthralgias, neck pain and neck stiffness. Negative for back pain, gait problem, joint swelling and myalgias.   Skin: Negative.    Allergic/Immunologic: Negative.    Neurological: Negative.    Hematological: Negative.    Psychiatric/Behavioral: Negative.          Objective:         ? acetaminophen SR (TYLENOL) 650 mg tablet Take 650 mg by mouth.   ? aspirin EC 81 mg tablet Take 81 mg by mouth daily.   ? blood sugar diagnostic (ONETOUCH ULTRA TEST) test strip Check blood sugar no more than twice daily   ? Blood-Glucose Meter kit Check blood sugar no more than twice daily   ? latanoprost (XALATAN) 0.005 % ophthalmic solution INSTILL 1 DROP INTO BOTH EYES AT BEDTIME   ? meloxicam (MOBIC) 7.5 mg tablet Take one tablet by mouth daily for 30 days.   ? metFORMIN (GLUCOPHAGE) 500 mg tablet Take one tablet by mouth twice daily after meals.   ? pravastatin (PRAVACHOL) 40 mg tablet Take one tablet by mouth at bedtime daily.     Vitals:    07/02/21 0920   BP: (!) 157/87   BP Source: Arm, Left Upper   Pulse: 82   Temp: 36.8 ?C (98.2 ?F)   Resp: 18 SpO2: 98%   TempSrc: Oral   PainSc: Ten   Weight: 85.1 kg (187 lb 9.6 oz)   Height: 160 cm (5' 3)     Body mass index is 33.23 kg/m?Marland Kitchen     Physical Exam  Constitutional:       Appearance: Normal appearance.   Cardiovascular:      Rate and Rhythm: Normal rate and regular rhythm.   Pulmonary:      Effort: Pulmonary effort is normal.      Breath sounds: Normal breath sounds.   Musculoskeletal:         General: Tenderness present. No swelling, deformity or signs of injury.      Right shoulder: Normal.      Left shoulder: Tenderness present. No swelling, deformity, effusion, laceration, bony tenderness or crepitus. Decreased range of motion. Decreased strength. Normal pulse.      Right lower leg: No edema.      Left lower leg: No edema.   Neurological:      Mental Status: She is alert and oriented to person, place, and time.  Cranial Nerves: Cranial nerves 2-12 are intact.      Sensory: Sensation is intact.      Motor: Motor function is intact.      Coordination: Coordination is intact.      Gait: Gait is intact.      Deep Tendon Reflexes: Reflexes are normal and symmetric.              Assessment and Plan:    Krystal Sullivan was seen today for shoulder pain.    Diagnoses and all orders for this visit:    Acute pain of left shoulder  -     SHOULDER MIN 2 VIEWS LEFT; Future; Expected date: 07/02/2021     Krystal Sullivan was seen today for shoulder pain.    Diagnoses and all orders for this visit:    Acute pain of left shoulder  -     SHOULDER MIN 2 VIEWS LEFT; Future; Expected date: 07/02/2021    Krystal Sullivan was seen today for shoulder pain.    Diagnoses and all orders for this visit:    Acute pain of left shoulder  -     SHOULDER MIN 2 VIEWS LEFT; Future; Expected date: 07/02/2021    Will call with final x-rya over read. I did suggest that the pt call back the primary care office so that they can order the PT that they suggested. Pt agrees to do so. She will await the x-ray report. Might need MRI in the future, pt is in agreement with MRI if needed   No follow-ups on file.

## 2021-07-04 ENCOUNTER — Encounter: Admit: 2021-07-04 | Discharge: 2021-07-04 | Payer: MEDICARE

## 2021-07-04 ENCOUNTER — Emergency Department: Admit: 2021-07-04 | Discharge: 2021-07-04 | Payer: MEDICARE

## 2021-07-04 ENCOUNTER — Inpatient Hospital Stay: Admit: 2021-07-04 | Payer: MEDICARE

## 2021-07-04 ENCOUNTER — Ambulatory Visit: Admit: 2021-07-04 | Discharge: 2021-07-04 | Payer: MEDICARE

## 2021-07-04 DIAGNOSIS — R61 Generalized hyperhidrosis: Secondary | ICD-10-CM

## 2021-07-04 DIAGNOSIS — R079 Chest pain, unspecified: Principal | ICD-10-CM

## 2021-07-04 DIAGNOSIS — H401190 Primary open-angle glaucoma, unspecified eye, stage unspecified: Secondary | ICD-10-CM

## 2021-07-04 DIAGNOSIS — E119 Type 2 diabetes mellitus without complications: Secondary | ICD-10-CM

## 2021-07-04 DIAGNOSIS — M25512 Pain in left shoulder: Secondary | ICD-10-CM

## 2021-07-04 DIAGNOSIS — R0602 Shortness of breath: Secondary | ICD-10-CM

## 2021-07-04 DIAGNOSIS — M79602 Pain in left arm: Secondary | ICD-10-CM

## 2021-07-04 DIAGNOSIS — R002 Palpitations: Secondary | ICD-10-CM

## 2021-07-04 LAB — POC GLUCOSE
POC GLUCOSE: 65 mg/dL — ABNORMAL LOW (ref 70–100)
POC GLUCOSE: 144 mg/dL — ABNORMAL HIGH (ref 70–100)

## 2021-07-04 LAB — POC POTASSIUM: POTASSIUM, POC: 5.1 MMOL/L (ref 3.5–5.1)

## 2021-07-04 LAB — COMPREHENSIVE METABOLIC PANEL
ALK PHOSPHATASE: 40 U/L — ABNORMAL HIGH (ref 25–110)
ANION GAP: 11 K/UL (ref 3–12)
BLD UREA NITROGEN: 16 mg/dL (ref 7–25)
CHLORIDE: 104 MMOL/L (ref 98–110)
GLUCOSE,PANEL: 67 mg/dL — ABNORMAL LOW (ref 70–100)
POTASSIUM: 5.8 MMOL/L — ABNORMAL HIGH (ref 3.5–5.1)
SODIUM: 139 MMOL/L (ref 137–147)

## 2021-07-04 LAB — BNP POC ER: BNP POC: <15 pg/mL (ref 0–100)

## 2021-07-04 LAB — CBC AND DIFF
ABSOLUTE BASO COUNT: 0 K/UL (ref 0–0.20)
ABSOLUTE LYMPH COUNT: 3.8 K/UL (ref 1.0–4.8)
ABSOLUTE MONO COUNT: 0.5 K/UL (ref 0–0.80)
BASOPHILS %: 1 % (ref 0–2)
MDW (MONOCYTE DISTRIBUTION WIDTH): 19 (ref <20.7)
RDW: 14 % (ref 11–15)
WBC COUNT: 8.6 K/UL (ref 4.5–11.0)

## 2021-07-04 LAB — POC TROPONIN: TROPONIN I, POC: 0 ng/mL (ref 0.00–0.05)

## 2021-07-04 MED ORDER — LATANOPROST 0.005 % OP DROP
1 [drp] | Freq: Every evening | OPHTHALMIC | 0 refills | Status: AC
Start: 2021-07-04 — End: ?
  Administered 2021-07-05: 05:00:00 1 [drp] via OPHTHALMIC

## 2021-07-04 MED ORDER — DICLOFENAC SODIUM 1 % TP GEL
2 g | Freq: Four times a day (QID) | TOPICAL | 0 refills | Status: AC
Start: 2021-07-04 — End: ?
  Administered 2021-07-05: 22:00:00 2 g via TOPICAL

## 2021-07-04 MED ORDER — PRAVASTATIN 40 MG PO TAB
40 mg | Freq: Every evening | ORAL | 0 refills | Status: AC
Start: 2021-07-04 — End: ?
  Administered 2021-07-05: 05:00:00 40 mg via ORAL

## 2021-07-04 MED ORDER — METFORMIN 500 MG PO TAB
500 mg | Freq: Two times a day (BID) | ORAL | 0 refills | Status: AC
Start: 2021-07-04 — End: ?
  Administered 2021-07-05 (×3): 500 mg via ORAL

## 2021-07-04 MED ORDER — ENOXAPARIN 40 MG/0.4 ML SC SYRG
40 mg | Freq: Every day | SUBCUTANEOUS | 0 refills | Status: AC
Start: 2021-07-04 — End: ?

## 2021-07-04 MED ORDER — ACETAMINOPHEN 325 MG PO TAB
650 mg | ORAL | 0 refills | Status: AC | PRN
Start: 2021-07-04 — End: ?
  Administered 2021-07-05 (×2): 650 mg via ORAL

## 2021-07-04 MED ORDER — ASPIRIN 81 MG PO TBEC
81 mg | Freq: Every day | ORAL | 0 refills | Status: AC
Start: 2021-07-04 — End: ?
  Administered 2021-07-05: 15:00:00 81 mg via ORAL

## 2021-07-04 MED ORDER — MORPHINE 2 MG/ML IV SYRG
2 mg | Freq: Once | INTRAVENOUS | 0 refills | Status: CP
Start: 2021-07-04 — End: ?
  Administered 2021-07-04: 22:00:00 2 mg via INTRAVENOUS

## 2021-07-04 NOTE — ED Provider Notes
Krystal Sullivan is a 87 y.o. female.    Chief Complaint:  Chief Complaint   Patient presents with   ? Palpitations     Occurs with exertion; going on the past couple weeks; associated with feeling sweaty and light headed. Mild shortness of breath.    ? Arm pain     Left arm; constant shooting pain; unable to raise arm all the way; sent here by her PCP. No falls or known injury       History of Present Illness:  Krystal Sullivan is a 87 y.o. female with past medical history of diabetes who presents with palpitations and left arm pain.  She is not having left arm pain for the last 2 weeks.  Palpitations have been occurring off and on and worse with activity.  She is also had shortness of breath during this time at rest but worse with activity.  She saw her primary care doctor today who recommended she come to the emergency department for evaluation.  She denies chest pain.  She denies cough.  She has chronic back pain.  She has had back and knee surgeries in the past.  She denies nausea and vomiting.          Review of Systems:  Review of Systems   Constitutional: Negative for chills and fever.   HENT: Positive for tinnitus (chronic). Negative for sore throat.    Eyes: Negative for visual disturbance.   Respiratory: Positive for shortness of breath. Negative for cough.    Cardiovascular: Positive for palpitations. Negative for chest pain.   Gastrointestinal: Negative for abdominal pain, nausea and vomiting.   Genitourinary: Negative for dysuria.   Musculoskeletal: Positive for back pain (chronic).   Skin: Negative for rash.   Neurological: Positive for headaches.       Allergies:  Fentanyl, Atorvastatin, and Simvastatin    Past Medical History:  Medical History:   Diagnosis Date   ? DM (diabetes mellitus) (HCC)    ? POAG (primary open-angle glaucoma)        Past Surgical History:  Surgical History:   Procedure Laterality Date   ? KNEE REPLACEMENT Right 2011   ? ABDOMINAL EXPLORATION SURGERY  2012    SBO   ? BACK SURGERY  2020    Bilaterally L4, L5 decompression    ? APPENDECTOMY     ? HIP REPLACEMENT Right    ? HX CATARACT REMOVAL     ? HYSTERECTOMY      for AUB       Pertinent medical/surgical history reviewed    Social History:  Social History     Tobacco Use   ? Smoking status: Never Smoker   ? Smokeless tobacco: Never Used   Substance Use Topics   ? Alcohol use: Never   ? Drug use: Never     Social History     Substance and Sexual Activity   Drug Use Never             Family History:  Family History   Problem Relation Age of Onset   ? Diabetes Type II Father    ? Alcohol abuse Son    ? Kidney Failure Daughter         renal failure   ? Glaucoma Neg Hx    ? Cataract Neg Hx    ? Retinal Detachment Neg Hx    ? Strabismus Neg Hx    ? Macular Degen Neg Hx    ? Blindness Neg  Hx    ? Amblyopia Neg Hx        Vitals:  ED Vitals    Date and Time T BP P RR SPO2P SPO2 User   07/04/21 2100 -- 121/74 66 15 PER MINUTE 65 98 % SG   07/04/21 2000 -- 145/69 70 17 PER MINUTE 68 97 % SG   07/04/21 1930 -- 144/68 70 15 PER MINUTE 70 98 % SG   07/04/21 1900 -- 137/67 69 17 PER MINUTE 69 98 % HEB   07/04/21 1704 -- 151/77 71 14 PER MINUTE 71 95 % HEB   07/04/21 1630 -- 124/98 70 17 PER MINUTE 70 98 % HEB   07/04/21 1609 -- 157/90 80 22 PER MINUTE 83 98 % HEB   07/04/21 1547 36.5 ?C (97.7 ?F) 148/96 90 18 PER MINUTE -- 100 % MG          Physical Exam:  Physical Exam  Vitals and nursing note reviewed.   Constitutional:       General: She is not in acute distress.     Appearance: Normal appearance. She is normal weight. She is not ill-appearing or diaphoretic.   HENT:      Head: Normocephalic and atraumatic.      Mouth/Throat:      Mouth: Mucous membranes are moist.      Pharynx: Oropharynx is clear.   Eyes:      Extraocular Movements: Extraocular movements intact.      Conjunctiva/sclera: Conjunctivae normal.   Cardiovascular:      Rate and Rhythm: Normal rate and regular rhythm.      Pulses: Normal pulses.   Pulmonary:      Effort: Pulmonary effort is normal.      Breath sounds: Normal breath sounds.   Abdominal:      General: Bowel sounds are normal. There is no distension.      Palpations: Abdomen is soft.      Tenderness: There is no abdominal tenderness.   Musculoskeletal:      Cervical back: Neck supple.      Right lower leg: No edema.      Left lower leg: No edema.      Comments: Spurling test negative; left shoulder ROM reduced due to pain without tenderness to palpation.  Normal strength distally   Skin:     General: Skin is warm and dry.   Neurological:      Mental Status: She is alert and oriented to person, place, and time. Mental status is at baseline.   Psychiatric:         Mood and Affect: Mood normal.         Behavior: Behavior normal.         Laboratory Results:  Labs Reviewed   CBC AND DIFF - Abnormal       Result Value Ref Range Status    White Blood Cells 8.6  4.5 - 11.0 K/UL Final    RBC 4.33  4.0 - 5.0 M/UL Final    Hemoglobin 11.7 (*) 12.0 - 15.0 GM/DL Final    Hematocrit 45.4  36 - 45 % Final    MCV 83.4  80 - 100 FL Final    MCH 27.1  26 - 34 PG Final    MCHC 32.5  32.0 - 36.0 G/DL Final    RDW 09.8  11 - 15 % Final    Platelet Count 232  150 - 400 K/UL Final    MPV  8.4  7 - 11 FL Final    Neutrophils 46  41 - 77 % Final    Lymphocytes 45 (*) 24 - 44 % Final    Monocytes 7  4 - 12 % Final    Eosinophils 1  0 - 5 % Final    Basophils 1  0 - 2 % Final    Absolute Neutrophil Count 4.04  1.8 - 7.0 K/UL Final    Absolute Lymph Count 3.81  1.0 - 4.8 K/UL Final    Absolute Monocyte Count 0.56  0 - 0.80 K/UL Final    Absolute Eosinophil Count 0.08  0 - 0.45 K/UL Final    Absolute Basophil Count 0.08  0 - 0.20 K/UL Final    MDW (Monocyte Distribution Width) 19.0  <20.7 Final   COMPREHENSIVE METABOLIC PANEL - Abnormal    Sodium 139  137 - 147 MMOL/L Final    Potassium 5.8 (*) 3.5 - 5.1 MMOL/L Final    Chloride 104  98 - 110 MMOL/L Final    Glucose 67 (*) 70 - 100 MG/DL Final    Blood Urea Nitrogen 16  7 - 25 MG/DL Final    Creatinine 1.61 0.4 - 1.00 MG/DL Final    Calcium 9.8  8.5 - 10.6 MG/DL Final    Total Protein 8.1 (*) 6.0 - 8.0 G/DL Final    Total Bilirubin 0.7  0.3 - 1.2 MG/DL Final    Albumin 4.5  3.5 - 5.0 G/DL Final    Alk Phosphatase 40  25 - 110 U/L Final    AST (SGOT) 30  7 - 40 U/L Final    CO2 24  21 - 30 MMOL/L Final    ALT (SGPT) 11  7 - 56 U/L Final    Anion Gap 11  3 - 12 Final    eGFR >60  >60 mL/min Final   POC GLUCOSE - Abnormal    Glucose, POC 65 (*) 70 - 100 MG/DL Final   POC GLUCOSE - Abnormal    Glucose, POC 144 (*) 70 - 100 MG/DL Final   POC TROPONIN    Troponin-I-POC 0.00  0.00 - 0.05 NG/ML Final   BNP POC ER    BNP POC <15.0  0 - 100 PG/ML Final   POC POTASSIUM    Potassium-POC 5.1  3.5 - 5.1 MMOL/L Final   POC TROPONIN   POC BNP   POC GLUCOSE     POC Glucose (Download): (!) 144    Radiology Interpretation:    CHEST SINGLE VIEW   Final Result         No evidence for acute cardiopulmonary disease.          Finalized by Maida Sale, MD on 07/04/2021 5:36 PM. Dictated by Maida Sale, MD on 07/04/2021 5:34 PM.               EKG:  ECG Results          ECG 12-LEAD (Final result)  Component (Lab Inquiry)     Collection Time Result Time VT RATE P-R Interval QRS DURATION Q-T Interval QTC Calc Bazett    07/04/21 15:47:44 07/04/21 17:28:25 81 150 74 384 446         Collection Time Result Time P Axis R Axis T Axis    07/04/21 15:47:44 07/04/21 17:28:25 73 -46 12               Final result  Impression:    Normal sinus rhythm  Left anterior fascicular block  Confirmed by Cherre Robins ([id number]) on 07/04/2021 5:28:21 PM                              ED Course:  Patient was roomed, IV access was established, and patient was placed on both the cardiac and pulse oximetry monitors.  Pertinent available records were reviewed.    Vital signs were reviewed and are as noted above.    Patient was seen and evaluated by resident and attending physicians.    Differential includes, but is not limited to, ACS, pneumonia, heart failure, arrhythmia, musculoskeletal pain, radiculopathy    Diagnostic Evaluation:  -Obtained EKG, labs and imaging studies to further evaluate the patient's concerns and symptoms.  Available results were reviewed and are as noted above.    -EKG was nonischemic and troponin negative, other laboratory work-up was relatively unremarkable    Patient was given 2 mg morphine with significant improvement in pain.  Left shoulder pain appears musculoskeletal in nature.  She initially denied chest pain on assessment but in the nursing notes and when asked, she states she is having intermittent chest pain especially with activity.  It is associated with diaphoresis.  The symptoms were concerning for possible demand ischemia versus ACS.  Discussed recommendation to admit for troponin trending, stress test, and echocardiography.  The patient and her daughter were in agreement with the plan.  Patient was admitted to internal medicine in stable condition.         ED Scoring:                                Coding    Facility Administered Meds:  Medications   morphine injection 2 mg (2 mg Intravenous Given 07/04/21 1632)         Clinical Impression:  Clinical Impression   Chest pain, unspecified type   Shortness of breath   Pain in joint of left shoulder       Disposition/Follow up  ED Disposition     ED Disposition   Admit           No follow-up provider specified.    Medications:  New Prescriptions    No medications on file       Procedure Notes:  Procedures      Attestation / Supervision:        Bryan Lemma, MD  Emergency Medicine PGY-2

## 2021-07-04 NOTE — Progress Notes
Date of Service: 07/04/2021    Krystal Sullivan is a 87 y.o. female.  DOB: Sep 27, 1930  MRN: [id number]     Subjective:             History of Present Illness  Chief Complaint   Patient presents with   ? Follow Up     Urgent care follow up     Krystal Sullivan presents today to follow-up on her urgent care visit. When I entered the room I asked her if it was her shoulder that was bothering and she points to the anterior left shoulder and reports that she is having significant pain there that is radiating down her left arm and she is having palpitations, sweating and shortness of breath. She does seem in more pain then when I saw her previously for her back a few weeks ago. She reports that the shortness of breath is worse with ambulation and that she has not been getting any relief from NSAIDs. She denies any injury and has not done any heavy lifting that would explain her pain. I told Krystal Sullivan that she is having red flag symptoms of a cardiac issue. She does not want to call am ambulance at this time, but is agreeable to leaving the clinic and proceeding straight to the ER. She had a friend drive her today and she will take her to the ER at [facility]    Cardiac risk factors:   Advanced age  Hyperlipidemia  Type 2 DM  Current symptoms         Review of Systems   Constitutional: Positive for diaphoresis and fatigue.   Respiratory: Positive for chest tightness and shortness of breath.    Cardiovascular: Positive for chest pain and palpitations.   Musculoskeletal: Positive for arthralgias (left shoulder, radiating down left arm).         Objective:         ? acetaminophen SR (TYLENOL) 650 mg tablet Take 650 mg by mouth.   ? aspirin EC 81 mg tablet Take 81 mg by mouth daily.   ? blood sugar diagnostic (ONETOUCH ULTRA TEST) test strip Check blood sugar no more than twice daily   ? Blood-Glucose Meter kit Check blood sugar no more than twice daily   ? latanoprost (XALATAN) 0.005 % ophthalmic solution INSTILL 1 DROP INTO BOTH EYES AT BEDTIME ? meloxicam (MOBIC) 7.5 mg tablet Take one tablet by mouth daily for 30 days.   ? metFORMIN (GLUCOPHAGE) 500 mg tablet Take one tablet by mouth twice daily after meals.   ? pravastatin (PRAVACHOL) 40 mg tablet Take one tablet by mouth at bedtime daily.     Vitals:    07/04/21 1415   BP: 134/82   BP Source: Arm, Left Upper   Pulse: 89   Temp: 36.3 ?C (97.3 ?F)   Resp: 16   SpO2: 99%   TempSrc: Temporal   PainSc: Seven   Weight: 85.3 kg (188 lb 1.6 oz)   Height: 160 cm (5' 2.99)     Body mass index is 33.33 kg/m?Marland Kitchen     Physical Exam  Vitals reviewed.   Constitutional:       Comments: Krystal Sullivan is sitting in a chair in the exam room, but does appear in pain. Her respirations are slightly higher than at her last visit.    HENT:      Head: Normocephalic and atraumatic.      Nose: Nose normal.   Eyes:      Extraocular Movements: Extraocular  movements intact.      Conjunctiva/sclera: Conjunctivae normal.   Cardiovascular:      Rate and Rhythm: Normal rate and regular rhythm.      Heart sounds: Normal heart sounds.   Pulmonary:      Effort: Pulmonary effort is normal.      Breath sounds: Normal breath sounds.   Musculoskeletal:      Cervical back: Neck supple.   Skin:     General: Skin is warm and dry.   Neurological:      General: No focal deficit present.      Mental Status: She is alert and oriented to person, place, and time.   Psychiatric:         Mood and Affect: Mood normal.         Behavior: Behavior normal.         Thought Content: Thought content normal.         Judgment: Judgment normal.              Assessment and Plan:    1. Left arm pain    2. Palpitations    3. Diaphoresis    4. Shortness of breath    I discussed with Krystal Sullivan that her symptoms are worrisome to me, especially with no known injury or overuse to her left arm/shoulder. I discussed with her that we need to emergently rule out that she is not having a heart attack and that cardiac symptoms can present differently in men and women. She has a friend with her that will drive her directly to the ER, which I think is reasonable, since her vitals are stable at this time and ausculation of her heart is normal. Krystal Sullivan, Engineer, site, assisted Krystal Sullivan to her car and she went with her friend to the ER.        Future Appointments   Date Time Provider Department Center   07/23/2021  7:30 AM [facility]   07/23/2021  7:45 AM Desma Mcgregor, MD [facility] Ophthalmolog

## 2021-07-04 NOTE — ED Notes
POC glucose 65. Pt given grape juice per protocol. MD aware.

## 2021-07-04 NOTE — ED Notes
Pt 87 y/o female arriving to [facility] ED 31. Pt here for arm pain that began 2-3 weeks ago. Pt states pain shoots from shoulder down to wrist. Pt denies injuries. Pt also endorses intermittent mid sternal chest pain that began 3 weeks ago. Pt states pain also goes to back and left jaw/shoulder. Pt denies shortness of breath and dizziness. Pt denies swelling in lower extremities. Pt denies abdominal pain, nausea/vomiting, and fevers.    Pt alert and oriented x 4 and independent at baseline. Pt resting in stretcher. Pt connected to bp, pulse ox, and tele. Pt waiting evaluation by MD.    Medical History:   Diagnosis Date    DM (diabetes mellitus) (HCC)     POAG (primary open-angle glaucoma)

## 2021-07-05 ENCOUNTER — Encounter: Admit: 2021-07-05 | Discharge: 2021-07-05 | Payer: MEDICARE

## 2021-07-05 ENCOUNTER — Inpatient Hospital Stay: Admit: 2021-07-05 | Discharge: 2021-07-05 | Payer: MEDICARE

## 2021-07-05 DIAGNOSIS — E119 Type 2 diabetes mellitus without complications: Secondary | ICD-10-CM

## 2021-07-05 DIAGNOSIS — H401190 Primary open-angle glaucoma, unspecified eye, stage unspecified: Secondary | ICD-10-CM

## 2021-07-05 MED ADMIN — AMINOPHYLLINE 500 MG/20 ML IV SOLN [88541]: 50 mg | INTRAVENOUS | @ 14:00:00 | Stop: 2021-07-06 | NDC 00517382025

## 2021-07-05 MED ADMIN — REGADENOSON 0.4 MG/5 ML IV SYRG [168865]: 0.4 mg | INTRAVENOUS | @ 14:00:00 | Stop: 2021-07-05 | NDC 00469650189

## 2021-07-05 MED ADMIN — PERFLUTREN LIPID MICROSPHERES 1.1 MG/ML IV SUSP [79178]: 2 mL | INTRAVENOUS | @ 21:00:00 | Stop: 2021-07-05 | NDC 11994001116

## 2021-07-05 MED ADMIN — RP DX TC-99M TETROFOSMIN MCI [210516]: 12.7 | INTRAVENOUS | @ 14:00:00 | Stop: 2021-07-05 | NDC 54029389609

## 2021-07-05 MED ADMIN — RP DX TC-99M TETROFOSMIN MCI [210516]: 4.9 | INTRAVENOUS | @ 12:00:00 | Stop: 2021-07-05 | NDC 54029389609

## 2021-07-05 NOTE — H&P (View-Only)
Admission History and Physical Examination      Name:  Krystal Sullivan                                             MRN:  [id number]   Admission Date:  07/04/2021                        Family Medicine Inpatient Service     Krystal Sullivan is a 87 y.o. female admitted from the ED for Palpitations.      PMHx:  has a past medical history of DM (diabetes mellitus) (HCC) and POAG (primary open-angle glaucoma).    Allergies: Fentanyl, Atorvastatin, and Simvastatin     Assessment / Plan     Principal Problem:    Palpitations  Active Problems:    DMII (diabetes mellitus, type 2) (HCC)    Left shoulder pain    Chest pain      Palpitations  Chest pain  - HEART score: 3  - Hemodynamically stable, afebrile and saturating well on room air  - EKG demonstrates: normal sinus rhythm, left anterior fascicular block; Qtc 446  - Trop 0.00   - CXR demonstrates no acute cardiopulmonary process  - Prior Stress Test n/a  - Previous heart cath n/a   PLAN  > trend troponin q6 hours   > Stress test in AM   > NPO at midnight, no caffeine, coffee, or chocolate  > NTG prn    Left shoulder pain  - Left shoulder X-ray on 07/02/2021 shows mild AC joint arthritis  - PTA Tylenol 650 mg p.o. as needed, diclofenac gel as needed  - Improved with morphine given in ED  PLAN:  > Continue PTA home regimen  > PT OT consulted      DMII (diabetes mellitus, type 2) (HCC)  Hyperlipidemia  - Last A1C: 6.3 on 06/14/21  - PTA Regimen: metformin 500 mg p.o. twice daily  - PTA pravastatin 40mg  po qhs  - Blood Glucose range in previous 24 hours: 65-144  - Received 0 units correctional insulin in last 24 hours   PLAN  > Diabetic Diet, NPO at midnight  > Continue oral regimen while inpatient  _______________________________________________________    FEN:  IVF: SL  Monitor and replace electrolytes as needed  Diet: Diet Diabetic (Standard) Consistent Carb    Access:  Lines: pIV  Urinary Catether: none    VTE Prophylaxis:  Mechanical: Sequential compression device  Pharmacological: Enoxaparin  GI Prophylaxis:  Indication: Not indicated by absence of high risk feature  Medications: Prophylactic pharmacotherapy not indicated    PT/OT: Ordered, appreciate recommendations  Wound: No Wound Consult      Code Status:  No Order    Disposition:   - Barriers to discharge: stress test  - Anticipate discharge in the next 48 hours, pending clinical stability and resolution of barriers to discharge.  - Social work and nurse case management involved for discharge planning, appreciate assistance    Admit as Inpatient status to FM-1 service  for Palpitations.   Patient previously living at home alone and has good support. Patient is not able to complete all ADLs independently due to pain with left shoulder.      This patient was seen and discussed with Michelene Heady, MD.     Miquel Dunn, DO  Family Medicine-1 Inpatient  Service  On Amie Critchley, Pager [phone]         History of Present Illness       Primary Care Physician: Bufford Buttner  Verified    Chief Complaint: Palpitations with exertion, left shoulder pain   History of Present Illness: Krystal Sullivan is a 87 y.o. female who  has a past medical history of DM (diabetes mellitus) (HCC) and POAG (primary open-angle glaucoma). who presented to the ED on 07/04/21 for left shoulder pain and palpitations with exertion.  Patient states symptoms have been ongoing for 3 months but was directed to the ED when she saw her PCP today with symptoms of left shoulder pain, palpitations with exertion, diaphoresis.  She denies fever, chills, chest pain, dyspnea, abdominal pain, bowel or bladder changes, chest pressure.  She has no history of stroke or MI.  She has a history of left shoulder pain, shoulder x-rays showed mild AC joint arthritis.  She received morphine 2 mg IV which improved her shoulder pain.    ED Course:    Labs: CBC, CMP, troponins, BNP all negative  Imaging: CXR negative, left shoulder x-ray shows mild AC joint arthritis without fracture or misalignment  ECG: Normal sinus rhythm, left anterior fascicular block no QT prolongation  Meds: Morphine      History provided by: Patient  Interpretor Used: No      Medical History:   Diagnosis Date   ? DM (diabetes mellitus) (HCC)    ? POAG (primary open-angle glaucoma)      Surgical History:   Procedure Laterality Date   ? KNEE REPLACEMENT Right 2011   ? ABDOMINAL EXPLORATION SURGERY  2012    SBO   ? BACK SURGERY  2020    Bilaterally L4, L5 decompression    ? APPENDECTOMY     ? HIP REPLACEMENT Right    ? HX CATARACT REMOVAL     ? HYSTERECTOMY      for AUB     Family history reviewed; non-contributory  Social History     Tobacco Use   ? Smoking status: Never Smoker   ? Smokeless tobacco: Never Used   Substance and Sexual Activity   ? Alcohol use: Never   ? Drug use: Never   ? Sexual activity: Not Currently     Partners: Male                    Immunizations (includes history and patient reported):   Immunization History   Administered Date(s) Administered   ? COVID-19 (PFIZER tris-sucrose) mRNA vacc, 30 mcg/0.3 mL (PF) 02/28/2021   ? COVID-19 (PFIZER), mRNA vacc, 30 mcg/0.3 mL (PF) 12/08/2019, 12/29/2019, 08/29/2020   ? FLU VACCINE >3YO 08/18/2019, 08/29/2020   ? Flu Vaccine =>65 YO High-Dose (PF) 08/29/2020   ? Influenza Vaccine Whole 08/07/2008, 08/13/2009, 08/26/2010   ? Pneumococcal Vaccine (23-Val Adult) 11/04/2003, 01/03/2009   ? Pneumococcal Vaccine(13-Val Peds/immunocompromised adult) 05/22/2015   ? Tetanus Vaccine 12/01/2007, 11/17/2018   ? Varicella-Zoster Vaccine - live (ZOSTAVAX) 02/24/2013   ? Zoster Vaccine Recombinant, Adjuvanted (shingles) IM (vial 2 of 2)(SHINGRIX) 12/02/2018, 05/19/2019           Allergies:  Fentanyl, Atorvastatin, and Simvastatin    Medications:  (Not in a hospital admission)      Review of Systems:   Review of Systems   Constitutional: Negative.    HENT: Negative.    Eyes: Negative.    Respiratory: Negative for cough, shortness of breath and wheezing.  Cardiovascular: Positive for palpitations. Negative for chest pain, orthopnea and leg swelling.   Gastrointestinal: Negative for abdominal pain, blood in stool, constipation, diarrhea, heartburn, nausea and vomiting.   Genitourinary: Negative for dysuria, frequency, hematuria and urgency.   Musculoskeletal: Positive for back pain and joint pain.   Skin: Negative.    Neurological: Negative for dizziness, sensory change, speech change, focal weakness, loss of consciousness, weakness and headaches.   Psychiatric/Behavioral: Negative for depression. The patient is not nervous/anxious.        Physical Exam:  Vital Signs: Last Filed In 24 Hours Vital Signs: 24 Hour Range   BP: 156/96 (09/01 2300)  Temp: 36.5 ?C (97.7 ?F) (09/01 1547)  Pulse: 68 (09/01 2300)  Respirations: 21 PER MINUTE (09/01 2300)  SpO2: 98 % (09/01 2300)  SpO2 Pulse: 67 (09/01 2300)  Height: 162.6 cm (5' 4) (09/01 1547) BP: (121-157)/(67-98)   Temp:  [36.3 ?C (97.3 ?F)-36.5 ?C (97.7 ?F)]   Pulse:  [66-90]   Respirations:  [14 PER MINUTE-22 PER MINUTE]   SpO2:  [95 %-100 %]    Intensity Pain Scale (Self Report): 10 (07/04/21 1545)      Physical Exam  Vitals and nursing note reviewed.   Constitutional:       General: She is not in acute distress.     Appearance: Normal appearance. She is obese.   HENT:      Head: Normocephalic.      Right Ear: External ear normal.      Left Ear: External ear normal.      Nose: Nose normal.      Mouth/Throat:      Mouth: Mucous membranes are moist.   Eyes:      Pupils: Pupils are equal, round, and reactive to light.   Cardiovascular:      Rate and Rhythm: Normal rate and regular rhythm.      Pulses: Normal pulses.      Heart sounds: Normal heart sounds. No murmur heard.  Pulmonary:      Effort: Pulmonary effort is normal.      Breath sounds: Normal breath sounds. No wheezing, rhonchi or rales.   Abdominal:      General: Abdomen is flat. Bowel sounds are normal.      Palpations: Abdomen is soft. There is no mass.      Tenderness: There is no abdominal tenderness. There is no guarding.   Musculoskeletal:         General: No swelling, tenderness or deformity. Normal range of motion.      Right shoulder: Normal.      Left shoulder: No swelling, deformity or tenderness. Normal range of motion. Normal strength.      Cervical back: Normal range of motion.      Right lower leg: No edema.      Left lower leg: No edema.   Skin:     General: Skin is warm and dry.   Neurological:      General: No focal deficit present.      Mental Status: She is alert and oriented to person, place, and time.      Cranial Nerves: Cranial nerves 2-12 are intact.      Sensory: Sensation is intact.      Motor: Motor function is intact.   Psychiatric:         Mood and Affect: Mood normal.         Behavior: Behavior normal.  Thought Content: Thought content normal.         Judgment: Judgment normal.         Lab/Radiology/Other Diagnostic Tests:  24-hour labs:    Results for orders placed or performed during the hospital encounter of 07/04/21 (from the past 24 hour(s))   CBC AND DIFF    Collection Time: 07/04/21  4:18 PM   Result Value Ref Range    White Blood Cells 8.6 4.5 - 11.0 K/UL    RBC 4.33 4.0 - 5.0 M/UL    Hemoglobin 11.7 (L) 12.0 - 15.0 GM/DL    Hematocrit 16.1 36 - 45 %    MCV 83.4 80 - 100 FL    MCH 27.1 26 - 34 PG    MCHC 32.5 32.0 - 36.0 G/DL    RDW 09.6 11 - 15 %    Platelet Count 232 150 - 400 K/UL    MPV 8.4 7 - 11 FL    Neutrophils 46 41 - 77 %    Lymphocytes 45 (H) 24 - 44 %    Monocytes 7 4 - 12 %    Eosinophils 1 0 - 5 %    Basophils 1 0 - 2 %    Absolute Neutrophil Count 4.04 1.8 - 7.0 K/UL    Absolute Lymph Count 3.81 1.0 - 4.8 K/UL    Absolute Monocyte Count 0.56 0 - 0.80 K/UL    Absolute Eosinophil Count 0.08 0 - 0.45 K/UL    Absolute Basophil Count 0.08 0 - 0.20 K/UL    MDW (Monocyte Distribution Width) 19.0 <20.7   COMPREHENSIVE METABOLIC PANEL    Collection Time: 07/04/21  4:18 PM   Result Value Ref Range    Sodium 139 137 - 147 MMOL/L    Potassium 5.8 (H) 3.5 - 5.1 MMOL/L    Chloride 104 98 - 110 MMOL/L    Glucose 67 (L) 70 - 100 MG/DL    Blood Urea Nitrogen 16 7 - 25 MG/DL    Creatinine 0.45 0.4 - 1.00 MG/DL    Calcium 9.8 8.5 - 40.9 MG/DL    Total Protein 8.1 (H) 6.0 - 8.0 G/DL    Total Bilirubin 0.7 0.3 - 1.2 MG/DL    Albumin 4.5 3.5 - 5.0 G/DL    Alk Phosphatase 40 25 - 110 U/L    AST (SGOT) 30 7 - 40 U/L    CO2 24 21 - 30 MMOL/L    ALT (SGPT) 11 7 - 56 U/L    Anion Gap 11 3 - 12    eGFR >60 >60 mL/min   POC TROPONIN    Collection Time: 07/04/21  4:23 PM   Result Value Ref Range    Troponin-I-POC 0.00 0.00 - 0.05 NG/ML   BNP POC ER    Collection Time: 07/04/21  4:33 PM   Result Value Ref Range    BNP POC <15.0 0 - 100 PG/ML   POC GLUCOSE    Collection Time: 07/04/21  6:02 PM   Result Value Ref Range    Glucose, POC 65 (L) 70 - 100 MG/DL   POC POTASSIUM    Collection Time: 07/04/21  6:41 PM   Result Value Ref Range    Potassium-POC 5.1 3.5 - 5.1 MMOL/L   POC GLUCOSE    Collection Time: 07/04/21  7:20 PM   Result Value Ref Range    Glucose, POC 144 (H) 70 - 100 MG/DL     Glucose: (!) 67 (81/19/14  1618)  POC Glucose (Download): (!) 144 (07/04/21 1920)  Pertinent radiology reviewed.    CHEST SINGLE VIEW   Final Result         No evidence for acute cardiopulmonary disease.          Finalized by Maida Sale, MD on 07/04/2021 5:36 PM. Dictated by Maida Sale, MD on 07/04/2021 5:34 PM.         REGADEN[facility]ON MPI STRESS TEST    (Results Pending)         Miquel Dunn, DO  Resident Physician, PGY-1  Department of Family Medicine  [facility]  Team Pager: [phone]

## 2021-07-09 ENCOUNTER — Encounter: Admit: 2021-07-09 | Discharge: 2021-07-09 | Payer: MEDICARE

## 2021-07-12 ENCOUNTER — Encounter: Admit: 2021-07-12 | Discharge: 2021-07-12 | Payer: MEDICARE

## 2021-07-12 DIAGNOSIS — M545 Acute midline low back pain without sciatica: Secondary | ICD-10-CM

## 2021-07-12 MED ORDER — MELOXICAM 7.5 MG PO TAB
ORAL_TABLET | Freq: Every day | ORAL | 0 refills | 30.00000 days | Status: AC
Start: 2021-07-12 — End: ?

## 2021-07-12 NOTE — Telephone Encounter
Last OV 07/04/21

## 2021-07-17 ENCOUNTER — Encounter: Admit: 2021-07-17 | Discharge: 2021-07-17 | Payer: MEDICARE

## 2021-07-17 ENCOUNTER — Ambulatory Visit: Admit: 2021-07-17 | Discharge: 2021-07-17 | Payer: MEDICARE

## 2021-07-17 DIAGNOSIS — R079 Chest pain, unspecified: Secondary | ICD-10-CM

## 2021-07-23 ENCOUNTER — Ambulatory Visit: Admit: 2021-07-23 | Discharge: 2021-07-23 | Payer: MEDICARE

## 2021-07-23 ENCOUNTER — Encounter: Admit: 2021-07-23 | Discharge: 2021-07-23 | Payer: MEDICARE

## 2021-07-23 DIAGNOSIS — H4010X2 Unspecified open-angle glaucoma, moderate stage: Secondary | ICD-10-CM

## 2021-07-23 DIAGNOSIS — E119 Type 2 diabetes mellitus without complications: Secondary | ICD-10-CM

## 2021-07-23 DIAGNOSIS — H401190 Primary open-angle glaucoma, unspecified eye, stage unspecified: Secondary | ICD-10-CM

## 2021-07-23 MED ORDER — LATANOPROST 0.005 % OP DROP
1 [drp] | Freq: Every evening | OPHTHALMIC | 4 refills | 28.00000 days | Status: AC
Start: 2021-07-23 — End: ?

## 2021-07-23 NOTE — Progress Notes
Referral Dr. Bing Matter for glaucoma    Assessment and Plan:    POAG (primary open-angle glaucoma)  Diagnosed previously outside [place]  On latanoprost qhs OU--with good IOP's    Pseudophakic  -stable  - no PCO    ONH traction OS  - contributing to poor vision OS?      -CCT average, iop is Good   -FHx (-), Trauma (-), Steroids (-)  -HVF 07/2021 OCT 07/2021 DFE ? GONIO 07/2021 FP NA    Plan  - IOP OK but HVF shows significant loss OU and pt feels vision worsening  - Request records from prev office  - If progressed may consider SLT vs another drop  - will repeat HVF at future visit for comparison       Return in about 4 months (around 11/22/2021).     ATTESTATION    I personally performed the key portions of the E/M visit, discussed case with resident and concur with resident documentation of history, physical exam, assessment, and treatment plan unless otherwise noted.    Staff name:  Mariana Kaufman MD Date:  07/23/2021          HPI:  Patient presents with:  Eye Problem: NP glaucoma evaluation per Dr. Bing Matter; pt doing well. Wants to wait to fill new gls rx from Dr. Karie Schwalbe until after her appointment today with Dr. Jason Fila. Denies any eye pain.   Medication Update: Latanoprost QHS OU.        Exam:  Base Eye Exam     Visual Acuity (Snellen - Linear)       Right Left    Dist cc 20/40 20/40 -2    Correction: Glasses   Has not filled new gls rx yet           Tonometry (Tonopen, 9:10 AM)       Right Left    Pressure 17 14          Tonometry #2 (Applanation, 9:25 AM)       Right Left    Pressure 14 14          Pachymetry (07/23/2021)       Right Left    Thickness 555 524          Pupils       Dark Shape React APD    Right 3 Round + None    Left 2 Round + None          Visual Fields    HVF 24-2           Extraocular Movement       Right Left     Full Full          Neuro/Psych     Oriented x3: Yes    Mood/Affect: Normal            Slit Lamp and Fundus Exam     External Exam       Right Left    External Normal Normal          Slit Lamp Exam       Right Left    Lids/Lashes MGD (thickened lid) MGD (thickened lid)    Conjunctiva/Sclera White and quiet, no LG White and quiet, no LG    Cornea Clear Clear    Anterior Chamber Deep and quiet Deep and quiet    Iris Flat Flat    Lens PCIOL PCIOL    Anterior Vitreous Normal Normal  Fundus Exam       Right Left    Disc Sharp Thin rim Sharp Thin rim    C/D Ratio 0.5 0.5            Refraction     Wearing Rx       Sphere Cylinder Axis Add    Right -1.75 +1.50 005 +2.50    Left -2.00 +0.75 010 +2.50    Type: Bifocal - Lined                VISUAL FIELD, EXTEND           Humphrey Automated exam type was used.     Right Eye  Threshold was 24-2. Strategy was SITA-FAST. Reliability was borderline. Findings location Superior, Inferior Arcuate Defect     Left Eye  Threshold was 24-2. Strategy was SITA-FAST. Reliability was borderline. Findings location Superior, Inferior Arcuate Defect     Notes  Bilateral inf and sup arcuate defects OUVF 77% OD, 75% OS           OCT OPTIC NERVE           Optic Nerve  Right Eye  Findings location Superior, Inferior, Temporal Optic nerve findings: Abnormal Thinning Progression has no prior data.     Left Eye  Optic nerve findings: Abnormal Thickening Progression has no prior data.     Notes  Thinning of ONH OD RNFL 71, with abnormal thickening OS secondary to traction RNFL 151

## 2021-07-24 ENCOUNTER — Encounter: Admit: 2021-07-24 | Discharge: 2021-07-24 | Payer: MEDICARE

## 2021-08-09 ENCOUNTER — Encounter: Admit: 2021-08-09 | Discharge: 2021-08-09 | Payer: MEDICARE

## 2021-08-09 ENCOUNTER — Ambulatory Visit: Admit: 2021-08-09 | Discharge: 2021-08-09 | Payer: MEDICARE

## 2021-08-09 DIAGNOSIS — Z0000 Encounter for general adult medical examination without abnormal findings: Secondary | ICD-10-CM

## 2021-08-09 DIAGNOSIS — E119 Type 2 diabetes mellitus without complications: Secondary | ICD-10-CM

## 2021-08-09 DIAGNOSIS — Z23 Encounter for immunization: Secondary | ICD-10-CM

## 2021-08-09 DIAGNOSIS — K921 Melena: Secondary | ICD-10-CM

## 2021-08-09 DIAGNOSIS — H401190 Primary open-angle glaucoma, unspecified eye, stage unspecified: Secondary | ICD-10-CM

## 2021-08-09 DIAGNOSIS — D500 Iron deficiency anemia secondary to blood loss (chronic): Secondary | ICD-10-CM

## 2021-08-09 DIAGNOSIS — M270 Developmental disorders of jaws: Secondary | ICD-10-CM

## 2021-08-09 DIAGNOSIS — Z961 Presence of intraocular lens: Secondary | ICD-10-CM

## 2021-08-09 DIAGNOSIS — E785 Hyperlipidemia, unspecified: Secondary | ICD-10-CM

## 2021-08-09 DIAGNOSIS — H52203 Unspecified astigmatism, bilateral: Secondary | ICD-10-CM

## 2021-08-09 DIAGNOSIS — M810 Age-related osteoporosis without current pathological fracture: Secondary | ICD-10-CM

## 2021-08-09 LAB — CBC AND DIFF
ABSOLUTE BASO COUNT: 0 K/UL (ref 0–0.20)
ABSOLUTE EOS COUNT: 0 K/UL (ref 0–0.45)
ABSOLUTE LYMPH COUNT: 3.6 K/UL (ref 1.0–4.8)
ABSOLUTE MONO COUNT: 0.5 K/UL (ref 0–0.80)
ABSOLUTE NEUTROPHIL: 3.6 K/UL (ref 1.8–7.0)
BASOPHILS %: 0 % (ref 0–2)
EOSINOPHILS %: 1 % (ref 0–5)
LYMPHOCYTES %: 46 % — ABNORMAL HIGH (ref 24–44)
MCHC: 32 g/dL (ref 32.0–36.0)
MONOCYTES %: 7 % (ref 4–12)
MPV: 8.7 FL (ref 7–11)
NEUTROPHILS %: 46 % (ref 41–77)
PLATELET COUNT: 239 K/UL (ref 150–400)
RBC COUNT: 4.2 M/UL (ref 4.0–5.0)
RDW: 14 % (ref 11–15)
WBC COUNT: 8 K/UL (ref 4.5–11.0)

## 2021-08-09 LAB — IRON + BINDING CAPACITY + %SAT+ FERRITIN
FERRITIN: 39 ng/mL (ref 10–200)
IRON BINDING: 320 ug/dL — ABNORMAL LOW (ref 270–380)
IRON: 85 ug/dL — ABNORMAL LOW (ref 50–160)

## 2021-08-09 LAB — VITAMIN B12: VITAMIN B12: 116 pg/mL — ABNORMAL HIGH (ref 180–914)

## 2021-08-09 NOTE — Progress Notes
Date of Service: 08/09/2021    Krystal Sullivan is a 87 y.o. female.  DOB: 09/12/1930  MRN: [id number]       Subjective:            She presents today for an Annual Medicare Wellness visit.       Chief Complaint   Patient presents with   ? Annual Wellness Visit     Recent hospitalization for chest pain and palpitations. Discharged and doing well.       History of Present Illness    Labs from inpatient stay reviewed with Nicole Cella today. Her CBC showed a microcytic anemia. She affirms that about 1 month ago she noticed that she was having dark tarry stools and ever since she has been wobbly, lightheaded, dizzy, and fatigued. She falls into things often but has not yet fallen to the floor. She states that her exercise tolerance is significantly decreased as well.  She denies vaginal bleeding, hematuria, hematemasis.    Glaucoma: Seen by opthamalogist in July 14. Dr. Jason Fila.   DM: On low carb diet. Metformin 500mg  BID. Dilated eye exam complete at opthamalogist. No diabetic retinopathy seen.  HLD: Takes pravastain once nightly. Eating high fiber diet.          Medical History:   Diagnosis Date   ? Age-related osteoporosis without current pathological fracture 02/28/2021   ? Astigmatism with presbyopia, bilateral 05/16/2021   ? DM (diabetes mellitus) (HCC)    ? POAG (primary open-angle glaucoma)    ? Pseudophakia of both eyes 05/16/2021   ? Torus mandibularis 02/28/2021     Surgical History:   Procedure Laterality Date   ? KNEE REPLACEMENT Right 2011   ? ABDOMINAL EXPLORATION SURGERY  2012    SBO   ? BACK SURGERY  2020    Bilaterally L4, L5 decompression    ? APPENDECTOMY     ? HIP REPLACEMENT Right    ? HX CATARACT REMOVAL     ? HYSTERECTOMY      for AUB     Family History   Problem Relation Age of Onset   ? Diabetes Type II Father    ? Alcohol abuse Son    ? Kidney Failure Daughter         renal failure   ? Glaucoma Neg Hx    ? Cataract Neg Hx    ? Retinal Detachment Neg Hx    ? Strabismus Neg Hx    ? Macular Degen Neg Hx    ? Blindness Neg Hx    ? Amblyopia Neg Hx      Social History     Socioeconomic History   ? Marital status: Widowed   Tobacco Use   ? Smoking status: Never Smoker   ? Smokeless tobacco: Never Used   Vaping Use   ? Vaping Use: Never used   Substance and Sexual Activity   ? Alcohol use: Never   ? Drug use: Never   ? Sexual activity: Not Currently     Partners: Male      I reviewed medications, allergies, problem list and tobacco history at this visit.    A Health Risk assessment was performed by the patient today & reviewed with the patient.  Health Risk Assessment Questionnaire  Current Care  List of Providers you have seen in the last two years: Dr. Jason Fila ;Dr. Eula Listen. Christain [place] ;[facility] APRN-NP;Dr. Renda Rolls APRN-NP  Are you receiving home health?: No  During the past 4 weeks,  how would you rate your health in general?: (!) Fair    Outside Care  Since your last PCP visit, have you received care outside of [facility]?: No  What type of care did you receive outside of [facility]? (select all that apply): (not recorded)      Physical Activity  Do you exercise or are you physically active?: (!) No          Diet  In the past month, were you worried whether your food would run out before you or your family had money to buy more?: No  In the past 7 days, how many times did you eat fast food or junk food or pizza?: 0  In the past 7 days, how many servings of fruits or vegetables did you eat each day?: More than 5 servings  In the past 7 days, how many sodas and sugar sweetened drinks (regular, not diet) did you drink each day?: 0    Smoke/Tobacco Use  Are you currently a smoker?: No      Alcohol Use  Do you drink alcohol?: No    Female: In the last three months, have you had >3 alcoholic beverages in any one day or >7 in any one week?: (not recorded)    Depression Screen  Little interest or pleasure in doing things: Not at All  Feeling down, depressed or hopeless: Not at All  Patient Scores:  PHQ-2: PHQ-2 Score: 0 (08/09/2021 10:47 AM)    PHQ-9: No data recorded  Interventions:  PHQ-2: PHQ-2 Score less than 3: No follow-up or recommendations are necessary at this time (08/09/2021 10:26 AM)    Depression Interventions PHQ-2/9: No data recorded  Eight minutes was spent reviewing the depression score and counseling patient.     Pain  How would you rate your pain today?: (!) Mild pain    Ambulation  Do you use any assistive devices for ambulation?: (!) Yes  What types of device? (select all that apply): Another person, Ephraim Hamburger    Fall Risk  Does it take you longer than 30 seconds to get up and out of a chair?: No  Have you fallen in the past year?: No  Fall History (last 72mo): No Falls    Motor Vehicle Safety  Do you fasten your seat belt when you are in the car?: Yes    Sun Exposure  Do you protect yourself from the sun? For example, wear sunscreen when outside.: (!) No    Hearing Loss  Do you have trouble hearing the television or radio when others do not?: No  Do you have to strain or struggle to hear/understand conversation?: No  Do you use hearing aids?: No    Urinary Incontinence  Have you had urine leakage in the past 6 months?: No  Does your urine leakage negatively impact your daily activities or sleep?: (not recorded)    Cognitive Impairment  During the past 12 months, have you experienced confusion or memory loss that is happening more often or is getting worse?: No    Functional Screen  Do you live alone?: Yes  Do you live at: Home  Can you drive your own car or travel alone by bus or taxi?: (!) No  Can you shop for groceries or clothes without help?: Yes  Can you prepare your own meals?: Yes  Can you do your own housework without help?: (!) No  Can you handle your  own money without help?: Yes  Do you need help eating, bathing, dressing, or getting around your home?: No  Do you feel safe?: Yes  Does anyone at home hurt you, hit you, or threaten you?: No  Have you ever been the victim of abuse?: No    Home Safety  Does your home have grab bars in the bathroom?: (!) No  Does your home have hand rails on stairs and steps?: (!) No  Does your home have functioning smoke alarms?: Yes    Advance Directive  Do you have a living will or Advance Directive?: Yes      Dental Screen  Have you had an exam by your dentist in the last year?: (!) No    Vision Screen  Do you have diabetes?: (!) Yes  When was your last eye exam?: 05/13/21  Eye Doctor Name?: Dr. Leitha Bleak  [facility] [address]    **DO NOT USE / HISTORICAL DATA** In the last 12 months, did you ever eat less than you should because there wasn't enough money for food?: No (02/28/2021  2:08 PM)  In the last 12 months, has your utility company shut off your service for not paying your bills?: No (02/28/2021  2:08 PM)  Are you worried that in the next 2 months, you may not have stable housing?: No (02/28/2021  2:08 PM)  Are you afraid that you may be hurt in your home by someone you know?: No (02/28/2021  2:08 PM)  Are you afraid you might be hurt in your apartment building or neighborhood?: No (02/28/2021  2:08 PM)  Do problems getting childcare make it difficult to work or study?: No (02/28/2021  2:08 PM)  In the last 12 months, have you needed to see a doctor, but could not because of cost?: No (02/28/2021  2:08 PM)  In the last 12 months, did you skip medications to save money?: No (02/28/2021  2:08 PM)  In the past 12 months, have you ever gone without  health care because you didn't have a way to get there?: No (02/28/2021  2:08 PM)  Do you have problems understanding what is told to you about your medical conditions?: No (02/28/2021  2:08 PM)  Do you often feel that you lack companionship?: No (02/28/2021  2:08 PM)  If you answered yes to any questions above, would you like to discuss help with your social work team?: No (02/28/2021  2:08 PM)        Personal prevention Plan reviewed with patient and a copy was given via the After Visit Summary.    While the patient was here today, due to his/her multiple chronic conditions it would be in the best interest of the patient for me to monitor, assess and evaluate those as well. They are as follows.  Patient Active Problem List    Diagnosis Date Noted   ? POAG (primary open-angle glaucoma) 05/16/2021   ? Dyslipidemia 04/09/2009     Overview Note:     Formatting of this note might be different from the original.  Qualifier: Diagnosis of   By: Drue Novel MD, Nolon Rod.      Last Assessment & Plan:   Formatting of this note might be different from the original.  Intolerant to simvastatin, Lipitor and Zetia     ? DMII (diabetes mellitus, type 2) (HCC) 12/01/2007         Records requested at the time of this visit:Yes    Prior consultations,  labs, radiology reports reviewed at the time of this visit.Yes: inpatient labs reviewed        BMI:  Body mass index is 32.74 kg/m?Marland Kitchen  No Follow-Up: No follow-up action or recommendations are necessary at this time (08/09/2021 11:55 AM)    Wt Readings from Last 10 Encounters:   08/09/21 83.8 kg (184 lb 12.8 oz)   07/05/21 84.8 kg (187 lb)   07/04/21 85.3 kg (188 lb 1.6 oz)   07/02/21 85.1 kg (187 lb 9.6 oz)   06/14/21 85.2 kg (187 lb 14.4 oz)   05/16/21 84.8 kg (187 lb)   02/28/21 84.9 kg (187 lb 1.6 oz)              Review of Systems   Constitutional: Negative for fatigue and unexpected weight change.   Eyes: Negative for visual disturbance.   Respiratory: Negative for chest tightness and shortness of breath.    Cardiovascular: Positive for palpitations (with exercise). Negative for chest pain and leg swelling.   Gastrointestinal: Positive for blood in stool (dark stool) and constipation. Negative for abdominal pain, diarrhea and nausea.   Genitourinary: Negative for decreased urine volume, hematuria and vaginal bleeding.   Skin: Negative for rash.   Neurological: Positive for dizziness, weakness and light-headedness. Negative for syncope and headaches. Objective:         ? acetaminophen SR (TYLENOL) 650 mg tablet Take 650 mg by mouth.   ? aspirin EC 81 mg tablet Take 81 mg by mouth daily.   ? blood sugar diagnostic (ONETOUCH ULTRA TEST) test strip Check blood sugar no more than twice daily   ? Blood-Glucose Meter kit Check blood sugar no more than twice daily   ? diclofenac sodium (VOLTAREN) 1 % topical gel Apply two g topically to affected area four times daily.   ? latanoprost (XALATAN) 0.005 % ophthalmic solution Apply one drop to both eyes at bedtime daily.   ? meloxicam (MOBIC) 7.5 mg tablet TAKE 1 TABLET BY MOUTH EVERY DAY   ? metFORMIN (GLUCOPHAGE) 500 mg tablet Take one tablet by mouth twice daily after meals.   ? pravastatin (PRAVACHOL) 40 mg tablet Take one tablet by mouth at bedtime daily.     Vitals:    08/09/21 1021   BP: 121/62   BP Source: Arm, Left Upper   Pulse: 82   Temp: 36.2 ?C (97.2 ?F)   Resp: 17   SpO2: 93%   TempSrc: Temporal   PainSc: Three   Weight: 83.8 kg (184 lb 12.8 oz)   Height: 160 cm (5' 3)         Body mass index is 32.74 kg/m?Marland Kitchen     Physical Exam  Constitutional:       Appearance: Normal appearance. She is obese. She is not ill-appearing.   HENT:      Head: Normocephalic and atraumatic.      Right Ear: Tympanic membrane, ear canal and external ear normal.      Left Ear: Tympanic membrane, ear canal and external ear normal.      Nose: Nose normal.      Mouth/Throat:      Mouth: Mucous membranes are moist.      Pharynx: No oropharyngeal exudate or posterior oropharyngeal erythema.   Eyes:      Extraocular Movements: Extraocular movements intact.      Conjunctiva/sclera: Conjunctivae normal.      Pupils: Pupils are equal, round, and reactive to light.      Comments: arcus  senilis present bilaterally   Neck:      Thyroid: No thyromegaly or thyroid tenderness.      Vascular: No carotid bruit.   Cardiovascular:      Rate and Rhythm: Normal rate and regular rhythm.      Pulses: Normal pulses.      Heart sounds: Normal heart sounds. Pulmonary:      Effort: Pulmonary effort is normal.      Breath sounds: Normal breath sounds.   Abdominal:      General: Abdomen is flat. There is no distension.      Palpations: Abdomen is soft. There is no mass.      Tenderness: There is no abdominal tenderness. There is no guarding or rebound.      Hernia: No hernia is present.   Musculoskeletal:         General: Normal range of motion.      Cervical back: Normal range of motion and neck supple.   Lymphadenopathy:      Cervical: No cervical adenopathy.   Skin:     General: Skin is warm and dry.      Capillary Refill: Capillary refill takes less than 2 seconds.      Findings: No rash.   Neurological:      General: No focal deficit present.      Mental Status: She is alert and oriented to person, place, and time.   Psychiatric:         Mood and Affect: Mood normal.         Behavior: Behavior normal.         Thought Content: Thought content normal.         Judgment: Judgment normal.              Assessment and Plan:      Abiola Behring was seen today for annual wellness visit.    Diagnoses and all orders for this visit:    Medicare annual wellness visit, subsequent  Labs reviewed today. Pt's vaccines were updated today. No annual labs needed as these were just done with her inpatient stay. She is managing her chronic illnesses wonderfully and has a good support system at home. She strives to eat healthy and exercise though exercise has been more difficulty recently due to fatigue. Encouraged that she is doing wonderfully and to reach out to [place] with questions or concerns about her health.  Iron deficiency anemia due to chronic blood loss  1 month history of lightheadedness, weakness, and fatigue likely due to her anemia that was seen on labs during inpatient stay. She affirms black tarry stools. Will order a fit test today to confirm the presence of blood in stool as well as iron studies and a Vit B12. If fit test results positive, will refer to GI for further workup, evaluation and treatment. If fit test negative and iron deficiency anemia is present will work up other potential causes of chronic blood loss. Pt has good understanding that dark green leafy vegetables are a good source of iron and B-12. She states that she avoids eating meat and may need supplementation, if iron is low.   -     FIT COLON CANCER SCREEN; Future; Expected date: 08/09/2021  -     VITAMIN B12; Future; Expected date: 08/09/2021  -     IRON + BINDING CAPACITY + %SAT+ FERRITIN; Future; Expected date: 08/09/2021  -     CBC AND DIFF; Future; Expected date: 08/09/2021  3. Melena  Will proceed with FIT test. If positive, we will proceed with colonoscopy.  - FIT COLON CANCER SCREEN; Future    4. Type 2 diabetes mellitus without complication, without long-term current use of insulin (HCC)  Aishi's diabetes is stable on metformin and diet modifications. Will continue to follow-up every 3 months in-office for monitoring.    5. Dyslipidemia  Calayah's cholesterol is stable and she will continue pravastatin daily.    6. Primary open angle glaucoma, unspecified glaucoma stage, unspecified laterality  Sharaine is following with ophthalmology and will follow-up yearly and as indicated. She is overdue to see them and we encouraged her to call and schedule.    7. Need for COVID-19 vaccine  - COVID-19 BIVALENT BOOSTER (57YR+)(PFIZER) VAC 30MCG/0.3ML    8. Flu vaccine need  - FLU VACCINE =>65 YO HIGH DOSE QUAD (PF) (FLUZONE)        Patient Instructions   Health Maintenance Summary    -      HBA1C (Every 6 Months) Next due on 12/15/2021    06/14/2021  POC HEMOGLOBIN A1C     02/28/2021  HEMOGLOBIN A1C           FOOT EXAM (Yearly) Next due on 02/28/2022    02/28/2021  [employer]: WORKFLOW - DIABETES - DIABETIC FOOT EXAM PERFORMED             MICROALBUMIN (Yearly) Next due on 02/28/2022    02/28/2021  MICROALBUMIN-URINE RANDOM           DILATED EYE EXAM (Yearly) Next due on 05/16/2022    05/16/2021  Previously completed - Dr. Jason Fila           OSTEOPOROSIS SCREENING/MONITORING  Addressed    01/24/2020  Care Everywhere           INFLUENZA VACCINE  Completed    08/09/2021  Imm Admin: Flu Vaccine =>65 YO High-Dose Quadrivalent (PF)     08/29/2020  Imm Admin: Flu Vaccine =>65 YO High-Dose (PF)     08/29/2020  Imm Admin: FLU VACCINE >3YO     08/18/2019  Imm Admin: FLU VACCINE >3YO     08/26/2010  Imm Admin: Influenza Vaccine Whole     Only the first 5 history entries have been loaded, but more history   exists.               Visit Disposition     Dispositions    ? Return in about 3 months (around 11/09/2021) for Medication Check.          Future Appointments   Date Time Provider Department Center   11/25/2021 11:00 AM Desma Mcgregor, MD [facility]     Return in about 3 months (around 11/09/2021) for Medication Check.    I reviewed with the patient their current medications and specifically any new medications prescribed at the time of this visit and we reviewed the expected benefits and potential side effects. All questions are answered to the patient's satisfaction.    ? Health maintenance gaps were reviewed with the patient at the time of this visit.    ? I emphasized the importance of medication adherence.   ? The medical problems/diagnoses listed under assessment and plan were addressed at this visit and unless otherwise stated are adequately controlled.           Portions of today's visit note were completed by NP Student. I personally performed or re-performed the history, physical exam and treatment for the E/M.  I discussed the case with the NP Student, and concur with the student's documentation of history, physical exam and treatment plan unless otherwise noted.

## 2021-08-09 NOTE — Patient Instructions
Health Maintenance Summary    -      HBA1C (Every 6 Months) Next due on 12/15/2021    06/14/2021  POC HEMOGLOBIN A1C     02/28/2021  HEMOGLOBIN A1C           FOOT EXAM (Yearly) Next due on 02/28/2022    02/28/2021  [employer]: WORKFLOW - DIABETES - DIABETIC FOOT EXAM PERFORMED             MICROALBUMIN (Yearly) Next due on 02/28/2022    02/28/2021  MICROALBUMIN-URINE RANDOM           DILATED EYE EXAM (Yearly) Next due on 05/16/2022    05/16/2021  Previously completed - Dr. Jason Fila           OSTEOPOROSIS SCREENING/MONITORING  Addressed    01/24/2020  Care Everywhere           INFLUENZA VACCINE  Completed    08/09/2021  Imm Admin: Flu Vaccine =>65 YO High-Dose Quadrivalent (PF)     08/29/2020  Imm Admin: Flu Vaccine =>65 YO High-Dose (PF)     08/29/2020  Imm Admin: FLU VACCINE >3YO     08/18/2019  Imm Admin: FLU VACCINE >3YO     08/26/2010  Imm Admin: Influenza Vaccine Whole     Only the first 5 history entries have been loaded, but more history   exists.

## 2021-08-11 ENCOUNTER — Encounter: Admit: 2021-08-11 | Discharge: 2021-08-11 | Payer: MEDICARE

## 2021-08-11 DIAGNOSIS — M545 Acute midline low back pain without sciatica: Secondary | ICD-10-CM

## 2021-08-11 MED ORDER — MELOXICAM 7.5 MG PO TAB
ORAL_TABLET | Freq: Every day | 0 refills
Start: 2021-08-11 — End: ?

## 2021-08-12 ENCOUNTER — Encounter: Admit: 2021-08-12 | Discharge: 2021-08-12 | Payer: MEDICARE

## 2021-08-12 NOTE — Telephone Encounter
Pt called and left a vm in regards toThe speciment needing to be sent if you dont mind calling her to follow up she isnt sure if she need to drop it off or mail it ?       cb# [phone]       Thanks,     [place]

## 2021-08-12 NOTE — Telephone Encounter
Left VM for patient letting her know she called the eye clinic and most likely meant to call a difference office.    No further action required.

## 2021-08-14 ENCOUNTER — Encounter: Admit: 2021-08-14 | Discharge: 2021-08-14 | Payer: MEDICARE

## 2021-08-14 DIAGNOSIS — K921 Melena: Secondary | ICD-10-CM

## 2021-08-14 DIAGNOSIS — D500 Iron deficiency anemia secondary to blood loss (chronic): Secondary | ICD-10-CM

## 2021-08-14 LAB — FIT COLON CANCER SCREEN: OCCULT BLOOD: NEGATIVE

## 2021-08-16 ENCOUNTER — Encounter: Admit: 2021-08-16 | Discharge: 2021-08-16 | Payer: MEDICARE

## 2021-09-06 ENCOUNTER — Encounter: Admit: 2021-09-06 | Discharge: 2021-09-06 | Payer: MEDICARE

## 2021-09-06 DIAGNOSIS — E119 Type 2 diabetes mellitus without complications: Secondary | ICD-10-CM

## 2021-09-06 MED ORDER — METFORMIN 500 MG PO TAB
500 mg | ORAL_TABLET | Freq: Two times a day (BID) | ORAL | 0 refills | Status: AC
Start: 2021-09-06 — End: ?

## 2021-09-06 MED ORDER — METFORMIN 500 MG PO TAB
500 mg | ORAL_TABLET | Freq: Two times a day (BID) | ORAL | 0 refills | Status: DC
Start: 2021-09-06 — End: 2021-09-06

## 2021-09-06 NOTE — Telephone Encounter
LOV 08/09/2021 AWV with Danielle Dess   RTC x 3 months

## 2021-11-11 ENCOUNTER — Encounter: Admit: 2021-11-11 | Discharge: 2021-11-11 | Payer: MEDICARE

## 2021-11-11 ENCOUNTER — Ambulatory Visit: Admit: 2021-11-11 | Discharge: 2021-11-11 | Payer: MEDICARE

## 2021-11-11 VITALS — BP 112/70 | HR 83 | Temp 97.30000°F | Resp 16 | Ht 62.992 in | Wt 179.7 lb

## 2021-11-11 DIAGNOSIS — E1100 Type 2 diabetes mellitus with hyperosmolarity without nonketotic hyperglycemic-hyperosmolar coma (NKHHC): Principal | ICD-10-CM

## 2021-11-11 DIAGNOSIS — M270 Developmental disorders of jaws: Secondary | ICD-10-CM

## 2021-11-11 DIAGNOSIS — H52203 Unspecified astigmatism, bilateral: Secondary | ICD-10-CM

## 2021-11-11 DIAGNOSIS — H401190 Primary open-angle glaucoma, unspecified eye, stage unspecified: Secondary | ICD-10-CM

## 2021-11-11 DIAGNOSIS — J0100 Acute maxillary sinusitis, unspecified: Secondary | ICD-10-CM

## 2021-11-11 DIAGNOSIS — Z961 Presence of intraocular lens: Secondary | ICD-10-CM

## 2021-11-11 DIAGNOSIS — E785 Hyperlipidemia, unspecified: Secondary | ICD-10-CM

## 2021-11-11 DIAGNOSIS — E119 Type 2 diabetes mellitus without complications: Secondary | ICD-10-CM

## 2021-11-11 DIAGNOSIS — M810 Age-related osteoporosis without current pathological fracture: Secondary | ICD-10-CM

## 2021-11-11 MED ORDER — PRAVASTATIN 40 MG PO TAB
40 mg | ORAL_TABLET | Freq: Every evening | ORAL | 0 refills | 90.00000 days | Status: AC
Start: 2021-11-11 — End: ?

## 2021-11-11 MED ORDER — METFORMIN 500 MG PO TAB
500 mg | ORAL_TABLET | Freq: Two times a day (BID) | ORAL | 0 refills | Status: AC
Start: 2021-11-11 — End: ?

## 2021-11-11 MED ORDER — FLUTICASONE PROPIONATE 50 MCG/ACTUATION NA SPSN
2 | Freq: Every day | NASAL | 0 refills | 60.00000 days | Status: AC
Start: 2021-11-11 — End: ?

## 2021-11-11 NOTE — Progress Notes
Date of Service: 11/11/2021    Krystal Sullivan is a 88 y.o. female.  DOB: 1930/04/23  MRN: [id number]     Subjective:             History of Present Illness  Chief Complaint   Patient presents with   ? Office Visit Follow Up     medications     Type 2 DM:   Patient presents today with a history of Type II Diabetes, which has been controlled.  Their recent A1c is: 6.1%  They are NOT checking their glucose regularly, she is not checking her glucose regularly   They are currently taking: metformin 2,000 mg daily  Medication Side Effects: None  Diet:  She is eating protein each meal and she does feel like that is helping, eating almost no red meat  Exercise: no regular exercise, sewing and so she sits for long periods  Comorbid conditions:  Hyperlipidemia  Denies any symptoms of hypoglycemia including headaches and dizziness. Denies paresthesias. Denies foot wounds, blisters or sores. Checks feet daily. Denies polyuria, polydipsia and polyphagia    Hyperlipidemia:  Patient presents today with a history of hyperlipidemia  Currently taking pravastatin and tolerating well  Patient is seeing cardiology  Lab Results   Component Value Date    CHOL 173 02/28/2021    TRIG 101 02/28/2021    HDL 60 02/28/2021    LDL 95 02/28/2021    VLDL 20 02/28/2021    NONHDLCHOL 113 02/28/2021      Left eye pain:  Krystal Sullivan presents today with a history of left eye and cheek pain  She is not sure if the pain is from sleeping on that side, a tooth or her sinus  She has never had a sinus infection before  She did try some Vick's and that did help  She denies any nasal drainage or any congestion  Has been present for 2 months  She does have a follow-up with an ophthalmologist in the next few weeks         Review of Systems   Constitutional: Negative for unexpected weight change.   HENT: Positive for sinus pressure (left) and sinus pain (left).    Respiratory: Negative for shortness of breath and wheezing.    Cardiovascular: Negative for chest pain and palpitations.   Psychiatric/Behavioral: Negative for behavioral problems.         Objective:         ? acetaminophen SR (TYLENOL) 650 mg tablet Take 650 mg by mouth.   ? aspirin EC 81 mg tablet Take 81 mg by mouth daily.   ? blood sugar diagnostic (ONETOUCH ULTRA TEST) test strip Check blood sugar no more than twice daily   ? Blood-Glucose Meter kit Check blood sugar no more than twice daily   ? diclofenac sodium (VOLTAREN) 1 % topical gel Apply two g topically to affected area four times daily.   ? fluticasone propionate (FLONASE ALLERGY RELIEF) 50 mcg/actuation nasal spray, suspension Apply two sprays to each nostril as directed daily. Shake bottle gently before using.   ? latanoprost (XALATAN) 0.005 % ophthalmic solution Apply one drop to both eyes at bedtime daily.   ? meloxicam (MOBIC) 7.5 mg tablet TAKE 1 TABLET BY MOUTH EVERY DAY   ? metFORMIN (GLUCOPHAGE) 500 mg tablet Take one tablet by mouth twice daily after meals.   ? pravastatin (PRAVACHOL) 40 mg tablet Take one tablet by mouth at bedtime daily.     Vitals:  11/11/21 1059   BP: 112/70   BP Source: Arm, Left Upper   Pulse: 83   Temp: 36.3 ?C (97.3 ?F)   Resp: 16   SpO2: 98%   TempSrc: Temporal   PainSc: Zero   Weight: 81.5 kg (179 lb 11.2 oz)   Height: 160 cm (5' 2.99)     Body mass index is 31.84 kg/m?Marland Kitchen     Physical Exam  Vitals reviewed.   Constitutional:       Appearance: Normal appearance.   HENT:      Head: Normocephalic and atraumatic.      Nose: Nose normal.   Eyes:      Extraocular Movements: Extraocular movements intact.      Conjunctiva/sclera: Conjunctivae normal.   Cardiovascular:      Rate and Rhythm: Normal rate and regular rhythm.      Heart sounds: Normal heart sounds.   Pulmonary:      Effort: Pulmonary effort is normal.      Breath sounds: Normal breath sounds.   Musculoskeletal:      Cervical back: Neck supple.   Skin:     General: Skin is warm and dry.   Neurological:      General: No focal deficit present.      Mental Status: She is alert and oriented to person, place, and time.   Psychiatric:         Mood and Affect: Mood normal.         Behavior: Behavior normal.         Thought Content: Thought content normal.         Judgment: Judgment normal.              Assessment and Plan:    1. Type 2 diabetes mellitus with hyperosmolarity without coma, without long-term current use of insulin (HCC)  Avana's A1c is improved and is at goal. She is doing really well. We will continue her metformin at the current dose and plan to see her back in 3 months to recheck labs.  - POC HEMOGLOBIN A1C  - metFORMIN (GLUCOPHAGE) 500 mg tablet; Take one tablet by mouth twice daily after meals.  Dispense: 180 tablet; Refill: 0    2. Dyslipidemia  Takeysha's labs have been stable and she is doing well tolerating pravastatin, so this was refilled today. Will follow-up in 3 months.  - pravastatin (PRAVACHOL) 40 mg tablet; Take one tablet by mouth at bedtime daily.  Dispense: 90 tablet; Refill: 0    3. Acute non-recurrent maxillary sinusitis  I suspect that Krystal Sullivan's pain may be related to sinusitis, since she is getting improvement with Vicks. We will start Flonase. I asked her to keep her appointment with ophthalmology and to call if she is not improved with the Flonase. Will consider CT sinus at that time to determine if there is any underlying infection.  - fluticasone propionate (FLONASE ALLERGY RELIEF) 50 mcg/actuation nasal spray, suspension; Apply two sprays to each nostril as directed daily. Shake bottle gently before using.  Dispense: 48 mL; Refill: 0    There are no Patient Instructions on file for this visit.  Future Appointments   Date Time Provider Department Center   11/25/2021 11:00 AM Desma Mcgregor, MD [facility] Ophthalmolog

## 2021-11-15 ENCOUNTER — Encounter: Admit: 2021-11-15 | Discharge: 2021-11-15 | Payer: MEDICARE

## 2021-11-25 ENCOUNTER — Encounter: Admit: 2021-11-25 | Discharge: 2021-11-25 | Payer: MEDICARE

## 2021-11-25 ENCOUNTER — Ambulatory Visit: Admit: 2021-11-25 | Discharge: 2021-11-25 | Payer: MEDICARE

## 2021-11-25 DIAGNOSIS — H52203 Unspecified astigmatism, bilateral: Secondary | ICD-10-CM

## 2021-11-25 DIAGNOSIS — M810 Age-related osteoporosis without current pathological fracture: Secondary | ICD-10-CM

## 2021-11-25 DIAGNOSIS — E119 Type 2 diabetes mellitus without complications: Secondary | ICD-10-CM

## 2021-11-25 DIAGNOSIS — H401190 Primary open-angle glaucoma, unspecified eye, stage unspecified: Secondary | ICD-10-CM

## 2021-11-25 DIAGNOSIS — Z961 Presence of intraocular lens: Secondary | ICD-10-CM

## 2021-11-25 DIAGNOSIS — H4010X2 Unspecified open-angle glaucoma, moderate stage: Secondary | ICD-10-CM

## 2021-11-25 DIAGNOSIS — M270 Developmental disorders of jaws: Secondary | ICD-10-CM

## 2021-11-25 NOTE — Progress Notes
Assessment:  ?  POAG (primary open-angle glaucoma) OU  Diagnosed previously outside [place]  -CCT average, iop is Good   -FHx (-), Trauma (-), Steroids (-)  -HVF 07/2021 OCT 07/2021 DFE ? GONIO 07/2021 FP NA  - On latanoprost QHS OU -- IOP slightly more elevated today    Pseudophakic  - Stable VA, no PCO  ?  ONH traction OS  - contributing to poor vision OS?  ?  ?  Plan:  - Continue latanoprost QHS OU  - Repeat OCT-n at next visit for comparison --  if progressed may consider SLT vs another drop    RTC 4 months - IOP check and OCT-n    Ann Lions, MD, MTS  PGY-3, Ophthalmology    ATTESTATION    I personally performed the key portions of the E/M visit, discussed case with resident and concur with resident documentation of history, physical exam, assessment, and treatment plan unless otherwise noted.    Staff name:  Mariana Kaufman MD Date:  11/25/2021        Return in about 4 months (around 03/25/2022) for IOP check, HVF, OCT-n.      HPI:  Patient presents with:  Eye Exam: 4 month IOP check.  Patient reports lots of sinus pain on her left side recently.  Vision changes denied.  Patient uses latanoprost qhs OU.    Patient states vision has been great since last visit. No issues using latanoprost.    Exam:  Base Eye Exam     Visual Acuity (Snellen - Linear)       Right Left    Dist cc 20/30 -2 20/40 -1    Correction: Glasses          Tonometry (iCare Tonometer, 11:09 AM)       Right Left    Pressure 17 18          Tonometry #2 (Applanation, 12:02 PM)       Right Left    Pressure 18 18          Neuro/Psych     Oriented x3: Yes    Mood/Affect: Normal            Slit Lamp and Fundus Exam     External Exam       Right Left    External Normal Normal          Slit Lamp Exam       Right Left    Lids/Lashes MGD (thickened lid) MGD (thickened lid)    Conjunctiva/Sclera White and quiet White and quiet    Cornea Clear Clear    Anterior Chamber Deep and quiet Deep and quiet    Iris Flat Flat    Lens PCIOL PCIOL    Anterior Vitreous Normal Normal          Fundus Exam       Right Left    Disc Sharp, thin rim, no DH Sharp, thin rim, no DH    C/D Ratio ?0.7 - difficult undilated 0.5

## 2021-11-29 ENCOUNTER — Encounter: Admit: 2021-11-29 | Discharge: 2021-11-29 | Payer: MEDICARE

## 2021-12-02 ENCOUNTER — Encounter: Admit: 2021-12-02 | Discharge: 2021-12-02 | Payer: MEDICARE

## 2022-02-06 ENCOUNTER — Encounter: Admit: 2022-02-06 | Discharge: 2022-02-06 | Payer: MEDICARE

## 2022-02-06 DIAGNOSIS — J0100 Acute maxillary sinusitis, unspecified: Secondary | ICD-10-CM

## 2022-02-06 DIAGNOSIS — E785 Hyperlipidemia, unspecified: Secondary | ICD-10-CM

## 2022-02-06 MED ORDER — FLUTICASONE PROPIONATE 50 MCG/ACTUATION NA SPSN
0 refills
Start: 2022-02-06 — End: ?

## 2022-02-06 MED ORDER — PRAVASTATIN 40 MG PO TAB
40 mg | ORAL_TABLET | Freq: Every evening | ORAL | 0 refills
Start: 2022-02-06 — End: ?

## 2022-02-06 NOTE — Telephone Encounter
Last office visit 11/11/21    Upcoming office visit 02/11/22

## 2022-02-11 ENCOUNTER — Encounter: Admit: 2022-02-11 | Discharge: 2022-02-11 | Payer: MEDICARE

## 2022-02-11 ENCOUNTER — Ambulatory Visit: Admit: 2022-02-11 | Discharge: 2022-02-12 | Payer: MEDICARE

## 2022-02-11 VITALS — BP 130/66 | HR 98 | Temp 97.30000°F | Resp 18 | Wt 177.3 lb

## 2022-02-11 DIAGNOSIS — Z961 Presence of intraocular lens: Secondary | ICD-10-CM

## 2022-02-11 DIAGNOSIS — M810 Age-related osteoporosis without current pathological fracture: Secondary | ICD-10-CM

## 2022-02-11 DIAGNOSIS — E785 Hyperlipidemia, unspecified: Secondary | ICD-10-CM

## 2022-02-11 DIAGNOSIS — M270 Developmental disorders of jaws: Secondary | ICD-10-CM

## 2022-02-11 DIAGNOSIS — H401190 Primary open-angle glaucoma, unspecified eye, stage unspecified: Secondary | ICD-10-CM

## 2022-02-11 DIAGNOSIS — E119 Type 2 diabetes mellitus without complications: Secondary | ICD-10-CM

## 2022-02-11 DIAGNOSIS — E1100 Type 2 diabetes mellitus with hyperosmolarity without nonketotic hyperglycemic-hyperosmolar coma (NKHHC): Principal | ICD-10-CM

## 2022-02-11 DIAGNOSIS — H52203 Unspecified astigmatism, bilateral: Secondary | ICD-10-CM

## 2022-02-11 MED ORDER — METFORMIN 500 MG PO TAB
500 mg | ORAL_TABLET | Freq: Two times a day (BID) | ORAL | 1 refills | Status: AC
Start: 2022-02-11 — End: ?

## 2022-02-11 MED ORDER — PRAVASTATIN 40 MG PO TAB
40 mg | ORAL_TABLET | Freq: Every evening | ORAL | 1 refills | 90.00000 days | Status: AC
Start: 2022-02-11 — End: ?

## 2022-02-11 NOTE — Progress Notes
Date of Service: 02/11/2022    Krystal Sullivan is a 88 y.o. female.  DOB: July 02, 1930  MRN: [id number]     Subjective:             History of Present Illness  Chief Complaint   Patient presents with   ? Medication Follow-up     Type 2 DM:   Patient presents today with a history of Type II Diabetes, which has been controlled.  Their recent A1c is: 5.9%  They are NOT checking their glucose regularly, not on any hypoglycemic medications  They are currently taking: metformin 500 mg twice daily with meals  Medication Side Effects: none, doing well  Diet:  Eating a healthy diet, reducing portions  Exercise: is pretty sedentary, due to age  Comorbid conditions: hyperlipidemia, obesity  Denies any symptoms of hypoglycemia including headaches and dizziness. Denies paresthesias. Denies foot wounds, blisters or sores. Checks feet daily. Denies polyuria, polydipsia and polyphagia.    Hyperlipidemia:  Patient presents today with a history of hyperlipidemia  Currently taking pravastatin and tolerating well  Patient is not seeing cardiology  Lab Results   Component Value Date    CHOL 173 02/28/2021    TRIG 101 02/28/2021    HDL 60 02/28/2021    LDL 95 02/28/2021    VLDL 20 02/28/2021    NONHDLCHOL 113 02/28/2021      Trouble with ADL's  Krystal Sullivan presents today with concerns that she is having trouble completing her ADL's  She is having pain, which inhibits her ability to be mobile  She cannot lift a vacuum, due to weakness and does not have enough stamina to dust   Cannot make her own bed, because she cannot lift the mattress to tuck the sheets in  She does OK with preparing simple meals, but nothing that she has to stand for too long  She does have family and they do assist, but she is needing more help than they can give  She does live in a retirement community, but is in her own home  Cannot drive and so her son helped her get some transportation, but it costs $15 every time she needs to go somewhere  Needs assistant to get to the store to get groceries and to get to appointments       Review of Systems   All other systems reviewed and are negative.        Objective:         ? acetaminophen SR (TYLENOL) 650 mg tablet Take one tablet by mouth.   ? aspirin EC 81 mg tablet Take one tablet by mouth daily.   ? blood sugar diagnostic (ONETOUCH ULTRA TEST) test strip Check blood sugar no more than twice daily   ? Blood-Glucose Meter kit Check blood sugar no more than twice daily   ? diclofenac sodium (VOLTAREN) 1 % topical gel Apply two g topically to affected area four times daily.   ? fluticasone propionate (FLONASE ALLERGY RELIEF) 50 mcg/actuation nasal spray, suspension Apply two sprays to each nostril as directed daily. Shake bottle gently before using.   ? latanoprost (XALATAN) 0.005 % ophthalmic solution Apply one drop to both eyes at bedtime daily.   ? meloxicam (MOBIC) 7.5 mg tablet TAKE 1 TABLET BY MOUTH EVERY DAY   ? metFORMIN (GLUCOPHAGE) 500 mg tablet Take one tablet by mouth twice daily after meals.   ? pravastatin (PRAVACHOL) 40 mg tablet Take one tablet by mouth at bedtime daily.  Vitals:    02/11/22 0920   BP: 130/66   BP Source: Arm, Right Upper   Pulse: 98   Temp: 36.3 ?C (97.3 ?F)   Resp: 18   SpO2: 99%   TempSrc: Temporal   PainSc: Zero   Weight: 80.4 kg (177 lb 4.8 oz)     Body mass index is 31.42 kg/m?Marland Kitchen     Physical Exam  Vitals reviewed.   Constitutional:       Appearance: Normal appearance. She is obese.      Comments: Ambulation with the assistance of a cane.   HENT:      Head: Normocephalic and atraumatic.      Nose: Nose normal.   Eyes:      Extraocular Movements: Extraocular movements intact.      Conjunctiva/sclera: Conjunctivae normal.   Cardiovascular:      Rate and Rhythm: Normal rate and regular rhythm.      Heart sounds: Normal heart sounds.   Pulmonary:      Effort: Pulmonary effort is normal.      Breath sounds: Normal breath sounds.   Musculoskeletal:      Cervical back: Neck supple.   Skin:     General: Skin is warm and dry.   Neurological:      General: No focal deficit present.      Mental Status: She is alert and oriented to person, place, and time.      Motor: Weakness present.      Gait: Gait abnormal.   Psychiatric:         Mood and Affect: Mood normal.         Behavior: Behavior normal.         Thought Content: Thought content normal.         Judgment: Judgment normal.              Assessment and Plan:    1. Type 2 diabetes mellitus with hyperosmolarity without coma, without long-term current use of insulin (HCC)  A1c is at goal and Krystal Sullivan is doing well. She is trying to meal prep and is trying to make healthy choices when it comes to food. Will continue her current dose of metformin and will plan to follow-up in 6 months.  - POC HEMOGLOBIN A1C  - metFORMIN (GLUCOPHAGE) 500 mg tablet; Take one tablet by mouth twice daily after meals.  Dispense: 180 tablet; Refill: 1    2. Dyslipidemia  Stable, will refill pravastatin at the current dose, since she is tolerating well and will plan to recheck fasting labs and follow-up in 6 months.  - pravastatin (PRAVACHOL) 40 mg tablet; Take one tablet by mouth at bedtime daily.  Dispense: 90 tablet; Refill: 1    3. Transportation insecurity  Will place social work referral to see if there is any assistance we can get Krystal Sullivan for transportation, since she does not drive, due to advanced age and weakness.  - INTRACLINIC REFERRAL FOR SOCIAL WORK    4. Difficulty with activities of daily living  Will place social work referral to see if there is any assistance we can get Krystal Sullivan with some of her house work, since she has been increasing more weak and is needing assistance with daily activities that can keep her independent and out of assisted living.  - INTRACLINIC REFERRAL FOR SOCIAL WORK        There are no Patient Instructions on file for this visit.  Future Appointments   Date Time  Provider Department Center   04/14/2022 10:45 AM [facility] Ophthalmolog 04/14/2022 11:00 AM Desma Mcgregor, MD [facility] Ophthalmolog

## 2022-02-12 DIAGNOSIS — Z5982 Transportation insecurity: Secondary | ICD-10-CM

## 2022-02-12 DIAGNOSIS — Z789 Other specified health status: Secondary | ICD-10-CM

## 2022-02-17 ENCOUNTER — Encounter: Admit: 2022-02-17 | Discharge: 2022-02-17 | Payer: MEDICARE

## 2022-04-14 ENCOUNTER — Encounter: Admit: 2022-04-14 | Discharge: 2022-04-14 | Payer: MEDICARE

## 2022-04-21 ENCOUNTER — Encounter: Admit: 2022-04-21 | Discharge: 2022-04-21 | Payer: MEDICARE | Primary: Family

## 2022-04-21 DIAGNOSIS — M270 Developmental disorders of jaws: Secondary | ICD-10-CM

## 2022-04-21 DIAGNOSIS — H52203 Unspecified astigmatism, bilateral: Secondary | ICD-10-CM

## 2022-04-21 DIAGNOSIS — E119 Type 2 diabetes mellitus without complications: Secondary | ICD-10-CM

## 2022-04-21 DIAGNOSIS — M810 Age-related osteoporosis without current pathological fracture: Secondary | ICD-10-CM

## 2022-04-21 DIAGNOSIS — H401190 Primary open-angle glaucoma, unspecified eye, stage unspecified: Secondary | ICD-10-CM

## 2022-04-21 DIAGNOSIS — Z961 Presence of intraocular lens: Secondary | ICD-10-CM

## 2022-04-21 NOTE — Progress Notes
Assessment:  ?  POAG (primary open-angle glaucoma) OU  Diagnosed previously outside [place]  -CCT?average, iop is?Good?  -FHx (-), Trauma (-), Steroids (-)  -HVF?07/2021?OCT 07/2021?DFE ??GONIO 07/2021?FP NA  - On latanoprost QHS OU -- IOP slightly more elevated today  ?  Pseudophakic  - Stable VA, no PCO  ?  ONH traction OS  - vision doing well     ?  ?  Plan:  - Continue latanoprost QHS OU  - OCT appears stable, pressures are mid teens on latanoprost   - Visual fields are difficult to interpret, appears to be a central scotoma OD?   - refer to retina for vitreo-optic nerve traction OS, vision doing well   - overall, unclear picture of progression, need to observe closer      Return in about 6 months (around 10/21/2022).     ATTESTATION    I personally performed the key portions of the E/M visit, discussed case with resident and concur with resident documentation of history, physical exam, assessment, and treatment plan unless otherwise noted.    Staff name:  Mariana Kaufman MD Date:  04/21/2022          HPI:  Patient presents with:  Eye Exam: 6 month FUV for moderate POAG OU. Patient reports vision is about the same; but complains of a blue flash out of OD sometimes when opening her eyes. Pt is struggling to see her TV at a distance without correction. Pt is using Latanoprost qhs OU.   No problems with drops      Exam:  Base Eye Exam     Visual Acuity (Snellen - Linear)       Right Left    Dist cc 20/20 -1 20/20 -1    Correction: Glasses          Tonometry (iCare Tonometer, 8:48 AM)       Right Left    Pressure 15 16          Tonometry #2 (Applanation, 9:21 AM)       Right Left    Pressure 15 15          Neuro/Psych     Oriented x3: Yes    Mood/Affect: Normal            Slit Lamp and Fundus Exam     External Exam       Right Left    External Normal Normal          Slit Lamp Exam       Right Left    Lids/Lashes MGD (thickened lid) MGD (thickened lid)    Conjunctiva/Sclera White and quiet White and quiet    Cornea Clear Clear    Anterior Chamber Deep and quiet Deep and quiet    Iris Flat Flat    Lens PCIOL PCIOL    Anterior Vitreous Normal Normal          Fundus Exam       Right Left    Disc Sharp healthy rim; large ON limited view    C/D Ratio 0.7             Refraction     Wearing Rx       Sphere Cylinder Axis Add    Right        Left        Age: 41 months          Wearing Rx #2       Sphere  Cylinder Axis Add    Right -1.25 +1.50 007 +2.50    Left -2.25 +1.75 175 +2.50    Age: 68 months    Type: Bifocal - Lined                VISUAL FIELD, EXTEND        Humphrey Automated exam type was used.     Right Eye  Threshold was 24-2. Strategy was SITA Standard. Reliability was borderline. Progression has worsened. Findings location Inferior Paracentral Scotoma     Left Eye  Threshold was 24-2. Strategy was SITA Standard. Reliability was poor. Progression has improved.     Notes   OD: 71 %  OS: 85 % VFI       OCT OPTIC NERVE        Optic Nerve  Right Eye  Findings location Superior, Inferior Optic nerve findings: Abnormal Thinning Progression has been stable.     Left Eye  Optic nerve findings: Abnormal Thickening Progression has no prior data.     Notes   OD: 69 uM-  OS: 164 uM?- innacurate measurement0 measure posterior hyaloid

## 2022-04-22 ENCOUNTER — Ambulatory Visit: Admit: 2022-04-21 | Discharge: 2022-04-22 | Payer: MEDICARE | Primary: Family

## 2022-04-22 DIAGNOSIS — H4010X2 Unspecified open-angle glaucoma, moderate stage: Secondary | ICD-10-CM

## 2022-05-22 ENCOUNTER — Encounter: Admit: 2022-05-22 | Discharge: 2022-05-22 | Payer: MEDICARE | Primary: Family

## 2022-05-22 NOTE — Telephone Encounter
Krystal Sullivan fell on her buttocks and now she feels like it is going up to her head  - lives alone

## 2022-05-22 NOTE — Telephone Encounter
Called patient to address their voicemail. Left voicemail for the patient to call [phone]. Attempt 2  Went straight to [employer] her contact Gardiner Ramus and it went straight to voicemail I did leave instructions to please call back and add [place] to contact list to see if it will ring through.

## 2022-05-22 NOTE — Telephone Encounter
[id number] friend Gardiner Ramus called back - tried Nivia again at 1523 will call Gardiner Ramus again- spoke with Gardiner Ramus and she reports Kingslee had a friend take her to [facility]

## 2022-05-22 NOTE — Telephone Encounter
Called patient to address their MyChart message. Left voicemail for patient to call [phone].  Attempt #1. Amy Belloso, RN

## 2022-05-22 NOTE — Telephone Encounter
[name] called back and when tried to return call it went straight to voicemail - LVM that Krystal Sullivan does not have an opening until 06/02/22 so she may need to go to Urgent Care but we would like to speak with her first

## 2022-06-18 ENCOUNTER — Encounter: Admit: 2022-06-18 | Discharge: 2022-06-18 | Payer: MEDICARE | Primary: Family

## 2022-07-28 ENCOUNTER — Encounter: Admit: 2022-07-28 | Discharge: 2022-07-28 | Payer: MEDICARE | Primary: Family

## 2022-07-28 ENCOUNTER — Ambulatory Visit: Admit: 2022-07-28 | Discharge: 2022-07-29 | Payer: MEDICARE | Primary: Family

## 2022-07-28 DIAGNOSIS — M270 Developmental disorders of jaws: Secondary | ICD-10-CM

## 2022-07-28 DIAGNOSIS — E119 Type 2 diabetes mellitus without complications: Secondary | ICD-10-CM

## 2022-07-28 DIAGNOSIS — Z23 Encounter for immunization: Secondary | ICD-10-CM

## 2022-07-28 DIAGNOSIS — H52203 Unspecified astigmatism, bilateral: Secondary | ICD-10-CM

## 2022-07-28 DIAGNOSIS — Z961 Presence of intraocular lens: Secondary | ICD-10-CM

## 2022-07-28 DIAGNOSIS — H401190 Primary open-angle glaucoma, unspecified eye, stage unspecified: Secondary | ICD-10-CM

## 2022-07-28 DIAGNOSIS — M810 Age-related osteoporosis without current pathological fracture: Secondary | ICD-10-CM

## 2022-07-30 ENCOUNTER — Encounter: Admit: 2022-07-30 | Discharge: 2022-07-30 | Payer: MEDICARE | Primary: Family

## 2022-07-30 NOTE — Telephone Encounter
Recv'd VM from pt stating that she saw Lilia Pro on Monday and went to her [facility] to get her scripts and they were not there. I cannot tell from the note if any meds were sent. Please advise  Drue Dun [facility]

## 2022-07-30 NOTE — Telephone Encounter
Tried to call patient to let her know Krystal Sullivan is out til tomorrow morning but her VM is full

## 2022-07-30 NOTE — Telephone Encounter
Patient thought you were sending in a script for her but she has checked with [facility] and they do not have it, please send to [facility] [address]

## 2022-07-31 ENCOUNTER — Encounter: Admit: 2022-07-31 | Discharge: 2022-07-31 | Payer: MEDICARE | Primary: Family

## 2022-07-31 DIAGNOSIS — E785 Hyperlipidemia, unspecified: Secondary | ICD-10-CM

## 2022-07-31 DIAGNOSIS — E1100 Type 2 diabetes mellitus with hyperosmolarity without nonketotic hyperglycemic-hyperosmolar coma (NKHHC): Secondary | ICD-10-CM

## 2022-07-31 MED ORDER — PRAVASTATIN 40 MG PO TAB
40 mg | ORAL_TABLET | Freq: Every evening | ORAL | 1 refills | 90.00000 days | Status: AC
Start: 2022-07-31 — End: ?

## 2022-07-31 MED ORDER — LATANOPROST 0.005 % OP DROP
1 [drp] | Freq: Every evening | OPHTHALMIC | 4 refills | 90.00000 days | Status: AC
Start: 2022-07-31 — End: ?

## 2022-07-31 MED ORDER — METFORMIN 500 MG PO TAB
500 mg | ORAL_TABLET | Freq: Two times a day (BID) | ORAL | 1 refills | Status: AC
Start: 2022-07-31 — End: ?

## 2022-08-27 ENCOUNTER — Ambulatory Visit: Admit: 2022-08-27 | Discharge: 2022-08-28 | Payer: MEDICARE | Primary: Family

## 2022-08-27 ENCOUNTER — Encounter: Admit: 2022-08-27 | Discharge: 2022-08-27 | Payer: MEDICARE | Primary: Family

## 2022-08-27 DIAGNOSIS — Z13228 Encounter for screening for other metabolic disorders: Secondary | ICD-10-CM

## 2022-08-27 DIAGNOSIS — M810 Age-related osteoporosis without current pathological fracture: Secondary | ICD-10-CM

## 2022-08-27 DIAGNOSIS — Z0000 Encounter for general adult medical examination without abnormal findings: Secondary | ICD-10-CM

## 2022-08-27 DIAGNOSIS — Z961 Presence of intraocular lens: Secondary | ICD-10-CM

## 2022-08-27 DIAGNOSIS — Z1329 Encounter for screening for other suspected endocrine disorder: Secondary | ICD-10-CM

## 2022-08-27 DIAGNOSIS — Z130 Encounter for screening for diseases of the blood and blood-forming organs and certain disorders involving the immune mechanism: Secondary | ICD-10-CM

## 2022-08-27 DIAGNOSIS — M270 Developmental disorders of jaws: Secondary | ICD-10-CM

## 2022-08-27 DIAGNOSIS — H52203 Unspecified astigmatism, bilateral: Secondary | ICD-10-CM

## 2022-08-27 DIAGNOSIS — E119 Type 2 diabetes mellitus without complications: Secondary | ICD-10-CM

## 2022-08-27 DIAGNOSIS — H401190 Primary open-angle glaucoma, unspecified eye, stage unspecified: Secondary | ICD-10-CM

## 2022-08-28 DIAGNOSIS — E1100 Type 2 diabetes mellitus with hyperosmolarity without nonketotic hyperglycemic-hyperosmolar coma (NKHHC): Secondary | ICD-10-CM

## 2022-08-28 DIAGNOSIS — Z13220 Encounter for screening for lipoid disorders: Secondary | ICD-10-CM

## 2022-10-20 ENCOUNTER — Ambulatory Visit: Admit: 2022-10-20 | Discharge: 2022-10-21 | Payer: MEDICARE

## 2022-10-20 ENCOUNTER — Encounter: Admit: 2022-10-20 | Discharge: 2022-10-20 | Payer: MEDICARE

## 2022-10-20 DIAGNOSIS — H401190 Primary open-angle glaucoma, unspecified eye, stage unspecified: Secondary | ICD-10-CM

## 2022-10-20 DIAGNOSIS — M810 Age-related osteoporosis without current pathological fracture: Secondary | ICD-10-CM

## 2022-10-20 DIAGNOSIS — H52203 Unspecified astigmatism, bilateral: Secondary | ICD-10-CM

## 2022-10-20 DIAGNOSIS — Z961 Presence of intraocular lens: Secondary | ICD-10-CM

## 2022-10-20 DIAGNOSIS — E1100 Type 2 diabetes mellitus with hyperosmolarity without nonketotic hyperglycemic-hyperosmolar coma (NKHHC): Secondary | ICD-10-CM

## 2022-10-20 DIAGNOSIS — H401131 Primary open-angle glaucoma, bilateral, mild stage: Secondary | ICD-10-CM

## 2022-10-20 DIAGNOSIS — E119 Type 2 diabetes mellitus without complications: Secondary | ICD-10-CM

## 2022-10-20 DIAGNOSIS — M270 Developmental disorders of jaws: Secondary | ICD-10-CM

## 2022-10-20 NOTE — Progress Notes
Glaucoma History  -FH   -Tmax:   -PMH Asthma,  OSA,  migraines  -Trauma   -CCT   -Steroid use   Previous Surgery/lasers  CE IOL OU  Medications  Allergies:   Allergies   Allergen Reactions    Fentanyl RASH     Rash at injection site    Alendronate Sodium NAUSEA ONLY    Atorvastatin SEE COMMENTS     tinnitus    Simvastatin SEE COMMENTS     Ear ringing     Drops: latan QHS OU  Goal IOP: determine         Assessment and Plan:    Problem   Poag (Primary Open-Angle Glaucoma)   Dmii (Diabetes Mellitus, Type 2) (Hcc)       POAG (primary open-angle glaucoma)  IOP acceptable today OU on drops as above   exam stable  Continue present management  ?Prescription drug management done today reviewing current glaucoma medications for side effects, need for refills, and tolerating drops      DMII (diabetes mellitus, type 2) (HCC)  Does the patient have Diabetes? Yes,  however the patient did not have a retinal exam today.  Will dilate next visit         NEXT VISIT   DFE and OCT - n and GCL 6 months       Narda Bonds, MD   [facility] of Ophthalmology      HPI:  Patient presents with:  Eye Problem: NP/FUV for Hx of POAG, last seen with Dr. Jason Fila 04/21/22; pt doing very well. Denies any vision changes or eye pain. Using Latan QHS OU without fail. DM is under control.        Exam:  Base Eye Exam       Visual Acuity (Snellen - Linear)         Right Left    Dist cc 20/20 20/20 -2      Correction: Glasses              Tonometry (iCare Tonometer, 9:14 AM)         Right Left    Pressure 13 14              Tonometry #2 (Applanation, 9:24 AM)         Right Left    Pressure 16 12              Pupils         Dark Shape React APD    Right 3 Round + None    Left 2 Round + None              Neuro/Psych       Oriented x3: Yes    Mood/Affect: Normal                  Slit Lamp and Fundus Exam       External Exam         Right Left    External Normal Normal              Slit Lamp Exam         Right Left    Lids/Lashes MGD (thickened lid) MGD (thickened lid)    Conjunctiva/Sclera White and quiet White and quiet    Cornea Clear Clear    Anterior Chamber Deep and quiet Deep and quiet    Iris Flat Flat    Lens PCIOL PCIOL    Anterior Vitreous  Normal Normal              Fundus Exam         Right Left    Disc Sharp healthy rim; large ON Sharp healthy rim; large ON    C/D Ratio 0.7 0.7                  Refraction       Wearing Rx         Sphere Cylinder Axis Add    Right -1.25 +1.50 007 +2.50    Left -2.25 +1.75 175 +2.50      Type: Bifocal - Lined

## 2022-10-20 NOTE — Assessment & Plan Note
IOP acceptable today OU on drops as above   exam stable  Continue present management  Prescription drug management done today reviewing current glaucoma medications for side effects, need for refills, and tolerating drops

## 2022-10-20 NOTE — Assessment & Plan Note
Does the patient have Diabetes? Yes,  however the patient did not have a retinal exam today.  Will dilate next visit

## 2023-01-10 ENCOUNTER — Encounter: Admit: 2023-01-10 | Discharge: 2023-01-10 | Payer: MEDICARE

## 2023-01-10 DIAGNOSIS — E785 Hyperlipidemia, unspecified: Secondary | ICD-10-CM

## 2023-01-10 MED ORDER — PRAVASTATIN 40 MG PO TAB
40 mg | ORAL_TABLET | Freq: Every evening | ORAL | 0 refills
Start: 2023-01-10 — End: ?

## 2023-01-31 ENCOUNTER — Encounter: Admit: 2023-01-31 | Discharge: 2023-01-31 | Payer: MEDICARE

## 2023-01-31 DIAGNOSIS — E1100 Type 2 diabetes mellitus with hyperosmolarity without nonketotic hyperglycemic-hyperosmolar coma (NKHHC): Secondary | ICD-10-CM

## 2023-01-31 MED ORDER — METFORMIN 500 MG PO TAB
ORAL_TABLET | 0 refills
Start: 2023-01-31 — End: ?

## 2023-02-05 ENCOUNTER — Encounter: Admit: 2023-02-05 | Discharge: 2023-02-05 | Payer: MEDICARE

## 2023-02-05 DIAGNOSIS — E1100 Type 2 diabetes mellitus with hyperosmolarity without nonketotic hyperglycemic-hyperosmolar coma (NKHHC): Secondary | ICD-10-CM

## 2023-02-05 MED ORDER — METFORMIN 500 MG PO TAB
500 mg | ORAL_TABLET | Freq: Two times a day (BID) | ORAL | 0 refills | Status: AC
Start: 2023-02-05 — End: ?

## 2023-02-05 NOTE — Telephone Encounter
Recv'd VM from pt requesting refill on Metformin. Pt last seen for a physical on 08/27/22. Pt does not have a follow up appt scheduled but is due back for refills. Called pt and transferred her to scheduling. Will send 1 month of Metformin  [place], [facility]

## 2023-04-09 ENCOUNTER — Encounter: Admit: 2023-04-09 | Discharge: 2023-04-09 | Payer: MEDICARE

## 2023-04-21 ENCOUNTER — Encounter: Admit: 2023-04-21 | Discharge: 2023-04-21 | Payer: MEDICARE

## 2023-04-21 ENCOUNTER — Ambulatory Visit: Admit: 2023-04-21 | Discharge: 2023-04-22 | Payer: MEDICARE

## 2023-04-21 DIAGNOSIS — Z961 Presence of intraocular lens: Secondary | ICD-10-CM

## 2023-04-21 DIAGNOSIS — M810 Age-related osteoporosis without current pathological fracture: Secondary | ICD-10-CM

## 2023-04-21 DIAGNOSIS — H401131 Primary open-angle glaucoma, bilateral, mild stage: Secondary | ICD-10-CM

## 2023-04-21 DIAGNOSIS — H52203 Unspecified astigmatism, bilateral: Secondary | ICD-10-CM

## 2023-04-21 DIAGNOSIS — M270 Developmental disorders of jaws: Secondary | ICD-10-CM

## 2023-04-21 DIAGNOSIS — E119 Type 2 diabetes mellitus without complications: Secondary | ICD-10-CM

## 2023-04-21 DIAGNOSIS — H401190 Primary open-angle glaucoma, unspecified eye, stage unspecified: Secondary | ICD-10-CM

## 2023-04-21 MED ORDER — TIMOLOL MALEATE 0.5 % OP DROP
1 [drp] | Freq: Every day | OPHTHALMIC | 4 refills | 100.00000 days | Status: AC
Start: 2023-04-21 — End: ?

## 2023-04-21 NOTE — Progress Notes
Glaucoma History  -FH   -Tmax:   -PMH Asthma,  OSA,  migraines  -Trauma   -CCT   -Steroid use   Previous Surgery/lasers  CE IOL OU  Medications  Allergies:   Allergies   Allergen Reactions    Fentanyl RASH     Rash at injection site    Alendronate Sodium NAUSEA ONLY    Atorvastatin SEE COMMENTS     tinnitus    Simvastatin SEE COMMENTS     Ear ringing     Drops: latan QHS OU  Goal IOP: determine         Assessment and Plan:    Problem   Dry Eye Syndrome of Both Eyes   Poag (Primary Open-Angle Glaucoma)         POAG (primary open-angle glaucoma)  IOP borderline today OU, pt feels vision is a little worse peripherally, OCT OD maybe a little worse.  Given she feels peripheral vision is worse want lower IOP   Add timolol qAM OU  Repeat IOP 3 months           NEXT VISIT   IOP Check  3 months       Narda Bonds, MD   [facility]      HPI:  Patient presents with:  Eye Problem: FUV for Hx of POAG OU; pt says she thinks her peripheral vision has worsened OU since last visit.   Denies any eye pain.  FYI: Pt had a mild fall this past Sunday. Doing fine now.        Exam:  Base Eye Exam       Visual Acuity (Snellen - Linear)         Right Left    Dist cc 20/20 -1 20/25 +2      Correction: Glasses              Tonometry (Tonopen, 11:10 AM)         Right Left    Pressure 12 12              Tonometry #2 (Applanation, 11:23 AM)         Right Left    Pressure 20 19              Pupils         Dark Shape React APD    Right 3 Round + None    Left 2 Round + None              Neuro/Psych       Oriented x3: Yes    Mood/Affect: Normal              Dilation       Both eyes: 1.0% Tropicamide, 2.5% Phenylephrine @ 11:11 AM                  Slit Lamp and Fundus Exam       External Exam         Right Left    External Normal Normal              Slit Lamp Exam         Right Left    Lids/Lashes MGD (thickened lid) MGD (thickened lid)    Conjunctiva/Sclera White and quiet White and quiet    Cornea Clear Clear    Anterior Chamber Deep and quiet Deep and quiet    Iris Flat Flat    Lens PCIOL  PCIOL    Anterior Vitreous syneresis syneresis              Fundus Exam         Right Left    Disc Sharp healthy rim; large ON Sharp healthy rim; large ON    C/D Ratio 0.7 0.7    Macula RPE clumping RPE clumping    Vessels Normal caliber and number Normal caliber and number    Periphery retina attached retina attached                  Refraction       Wearing Rx         Sphere Cylinder Axis Add    Right -1.25 +1.50 007 +2.50    Left -2.25 +1.75 175 +2.50      Type: Bifocal - Lined                    OCT OPTIC NERVE          Optic Nerve  Right Eye  Findings location Superior, Inferior Optic nerve findings: Abnormal Thinning     Left Eye  Optic nerve findings: Normal Progression has been stable.     Notes  OD possibly worse

## 2023-04-21 NOTE — Patient Instructions
both eyes  latanoprost (teal top) 1 drop at night   Timolol (yellow top) 1 drop in the morning

## 2023-04-21 NOTE — Assessment & Plan Note
IOP borderline today OU, pt feels vision is a little worse peripherally, OCT OD maybe a little worse.  Given she feels peripheral vision is worse want lower IOP   Add timolol qAM OU  Repeat IOP 3 months

## 2023-04-26 ENCOUNTER — Encounter: Admit: 2023-04-26 | Discharge: 2023-04-26 | Payer: MEDICARE

## 2023-04-26 DIAGNOSIS — E785 Hyperlipidemia, unspecified: Secondary | ICD-10-CM

## 2023-04-26 MED ORDER — PRAVASTATIN 40 MG PO TAB
40 mg | ORAL_TABLET | Freq: Every evening | ORAL | 0 refills
Start: 2023-04-26 — End: ?

## 2023-05-02 ENCOUNTER — Encounter: Admit: 2023-05-02 | Discharge: 2023-05-02 | Payer: MEDICARE

## 2023-05-02 DIAGNOSIS — E1100 Type 2 diabetes mellitus with hyperosmolarity without nonketotic hyperglycemic-hyperosmolar coma (NKHHC): Secondary | ICD-10-CM

## 2023-05-02 MED ORDER — METFORMIN 500 MG PO TAB
ORAL_TABLET | 0 refills
Start: 2023-05-02 — End: ?

## 2023-05-12 ENCOUNTER — Ambulatory Visit: Admit: 2023-05-12 | Discharge: 2023-05-12 | Payer: MEDICARE

## 2023-05-12 ENCOUNTER — Encounter: Admit: 2023-05-12 | Discharge: 2023-05-12 | Payer: MEDICARE

## 2023-05-12 ENCOUNTER — Ambulatory Visit: Admit: 2023-05-12 | Discharge: 2023-05-13 | Payer: MEDICARE

## 2023-05-12 VITALS — BP 130/84 | HR 72 | Temp 97.30000°F | Resp 16 | Wt 173.0 lb

## 2023-05-12 DIAGNOSIS — M270 Developmental disorders of jaws: Secondary | ICD-10-CM

## 2023-05-12 DIAGNOSIS — H401190 Primary open-angle glaucoma, unspecified eye, stage unspecified: Secondary | ICD-10-CM

## 2023-05-12 DIAGNOSIS — E119 Type 2 diabetes mellitus without complications: Secondary | ICD-10-CM

## 2023-05-12 DIAGNOSIS — Z130 Encounter for screening for diseases of the blood and blood-forming organs and certain disorders involving the immune mechanism: Secondary | ICD-10-CM

## 2023-05-12 DIAGNOSIS — Z961 Presence of intraocular lens: Secondary | ICD-10-CM

## 2023-05-12 DIAGNOSIS — H52203 Unspecified astigmatism, bilateral: Secondary | ICD-10-CM

## 2023-05-12 DIAGNOSIS — E1100 Type 2 diabetes mellitus with hyperosmolarity without nonketotic hyperglycemic-hyperosmolar coma (NKHHC): Secondary | ICD-10-CM

## 2023-05-12 DIAGNOSIS — E785 Hyperlipidemia, unspecified: Secondary | ICD-10-CM

## 2023-05-12 DIAGNOSIS — M810 Age-related osteoporosis without current pathological fracture: Secondary | ICD-10-CM

## 2023-05-12 LAB — HEMOGLOBIN A1C: Hemoglobin A1C: 6.1

## 2023-05-12 LAB — HM DIABETES FOOT EXAM: HM Diabetic Foot Exam: NORMAL

## 2023-05-12 LAB — COMPREHENSIVE METABOLIC PANEL
ALBUMIN: 4.4 g/dL (ref 3.5–5.0)
ALT: 12 U/L (ref 7–56)
ANION GAP: 9 K/UL (ref 3–12)
AST: 17 U/L (ref 7–40)
CHLORIDE: 103 MMOL/L (ref 98–110)
CO2: 30 MMOL/L (ref 21–30)
EGFR: >60 mL/min (ref >60)
GLUCOSE,PANEL: 100 mg/dL (ref 70–100)
SODIUM: 142 MMOL/L (ref 137–147)
TOTAL BILIRUBIN: 0.9 mg/dL (ref 0.2–1.3)
TOTAL PROTEIN: 7.9 g/dL (ref 6.0–8.0)

## 2023-05-12 LAB — CBC AND DIFF
ABSOLUTE BASO COUNT: 0 K/UL (ref 0–0.20)
EOSINOPHILS %: 2 % (ref 0–5)
HEMOGLOBIN: 12 g/dL (ref 12.0–15.0)
MCV: 84 FL (ref 80–100)
MPV: 9 FL (ref 7–11)
PLATELET COUNT: 220 K/UL (ref 150–400)
RBC COUNT: 4.4 M/UL (ref 4.0–5.0)
RDW: 14 % (ref 11–15)
WBC COUNT: 8.7 K/UL (ref 4.5–11.0)

## 2023-05-12 MED ORDER — PRAVASTATIN 40 MG PO TAB
40 mg | ORAL_TABLET | Freq: Every evening | ORAL | 1 refills | 90.00000 days | Status: AC
Start: 2023-05-12 — End: ?

## 2023-05-12 MED ORDER — METFORMIN 500 MG PO TAB
500 mg | ORAL_TABLET | Freq: Two times a day (BID) | ORAL | 1 refills | Status: AC
Start: 2023-05-12 — End: ?

## 2023-05-12 NOTE — Progress Notes
Date of Service: 05/12/2023    Krystal Sullivan is a 89 y.o. female.  DOB: 1929/11/06  MRN: [id number]     Subjective:             History of Present Illness  Chief Complaint   Patient presents with    Medication Refill     Type 2 DM:   Patient presents today with a history of Type II Diabetes, which has been well controlled.  Their recent A1c is: 6.1%  They are NOT checking their glucose regularly, no hypoglycemic agents  They are currently taking: metformin 500 mg BID  Medication Side Effects: None, doing well  Diet:  eating a very whole food, plant-based diet and cooking at home  Exercise: very active and walks regularly  Comorbid conditions:  Hyperlipidemia  Denies any symptoms of hypoglycemia including headaches and dizziness. Denies paresthesias. Denies foot wounds, blisters or sores. Checks feet daily. Denies polyuria, polydipsia and polyphagia.    Hyperlipidemia:  Patient presents today with a history of hyperlipidemia  Currently taking pravastatin 40 mg dialy and tolerating well  Patient is not seeing cardiology  Does not feel the need to repeat cholesterol labs, declined today  Lab Results   Component Value Date    CHOL 173 02/28/2021    TRIG 101 02/28/2021    HDL 60 02/28/2021    LDL 95 02/28/2021    VLDL 20 02/28/2021    NONHDLCHOL 113 02/28/2021           Review of Systems   All other systems reviewed and are negative.        Objective:          acetaminophen SR (TYLENOL) 650 mg tablet Take one tablet by mouth.    aspirin EC 81 mg tablet Take one tablet by mouth daily.    blood sugar diagnostic (ONETOUCH ULTRA TEST) test strip Check blood sugar no more than twice daily    Blood-Glucose Meter kit Check blood sugar no more than twice daily    diclofenac sodium (VOLTAREN) 1 % topical gel Apply two g topically to affected area four times daily.    fluticasone propionate (FLONASE ALLERGY RELIEF) 50 mcg/actuation nasal spray, suspension Apply two sprays to each nostril as directed daily. Shake bottle gently before using.    latanoprost (XALATAN) 0.005 % ophthalmic solution Apply one drop to both eyes at bedtime daily.    meloxicam (MOBIC) 7.5 mg tablet TAKE 1 TABLET BY MOUTH EVERY DAY    metFORMIN (GLUCOPHAGE) 500 mg tablet TAKE 1 TABLET BY MOUTH TWICE DAILY AFTER A MEAL    pravastatin (PRAVACHOL) 40 mg tablet TAKE 1 TABLET BY MOUTH ONCE DAILY AT BEDTIME    timoloL maleate (TIMOPTIC) 0.5 % ophthalmic drops Apply one drop to both eyes daily.     Vitals:    05/12/23 1101   BP: 130/84   BP Source: Arm, Left Upper   Pulse: 72   Temp: 36.3 ?C (97.3 ?F)   Resp: 16   SpO2: 97%   TempSrc: Temporal   Weight: 78.5 kg (173 lb)     Body mass index is 30.65 kg/m?Marland Kitchen     Physical Exam  Vitals reviewed.   Constitutional:       Appearance: Normal appearance.   HENT:      Head: Normocephalic and atraumatic.      Nose: Nose normal.   Eyes:      Extraocular Movements: Extraocular movements intact.      Conjunctiva/sclera: Conjunctivae normal.  Cardiovascular:      Rate and Rhythm: Normal rate and regular rhythm.      Heart sounds: Normal heart sounds.   Pulmonary:      Effort: Pulmonary effort is normal.      Breath sounds: Normal breath sounds.   Musculoskeletal:      Cervical back: Neck supple.   Skin:     General: Skin is warm and dry.   Neurological:      General: No focal deficit present.      Mental Status: She is alert and oriented to person, place, and time.   Psychiatric:         Mood and Affect: Mood normal.         Behavior: Behavior normal.         Thought Content: Thought content normal.         Judgment: Judgment normal.              Assessment and Plan:    1. Type 2 diabetes mellitus with hyperosmolarity without coma, without long-term current use of insulin (HCC)  Controlled  Hemoglobin A1C remains stable on current medication regimen of: metformin 500 mg BID  No medication adjustments made today.   Re-iterated the importance of diabetic diet, daily exercise, and weight reduction.  Eye exam UTD.   Recommended daily feet checks. RTC promptly for any signs of blisters or wound that are not healing.   Await additional lab results. Further POC to be determined upon lab review.   Since A1C is well controlled, patient next follow-up appointment due in 6 months. Return sooner with any concerns.   - POC HEMOGLOBIN A1C  - COMPREHENSIVE METABOLIC PANEL; Future  - metFORMIN (GLUCOPHAGE) 500 mg tablet; Take one tablet by mouth twice daily with meals.  Dispense: 180 tablet; Refill: 1    2. Dyslipidemia  Will continue pravastatin at the current dose and refills sent today.   - pravastatin (PRAVACHOL) 40 mg tablet; Take one tablet by mouth at bedtime daily.  Dispense: 90 tablet; Refill: 1    3. Screening for deficiency anemia  - CBC AND DIFF; Future    There are no Patient Instructions on file for this visit.  Future Appointments   Date Time Provider Department Center   07/24/2023  3:15 PM Emiliano Dyer, MD [facility] Ophthalmolog

## 2023-05-13 DIAGNOSIS — E1100 Type 2 diabetes mellitus with hyperosmolarity without nonketotic hyperglycemic-hyperosmolar coma (NKHHC): Principal | ICD-10-CM

## 2023-05-15 ENCOUNTER — Encounter: Admit: 2023-05-15 | Discharge: 2023-05-15 | Payer: MEDICARE

## 2023-05-15 NOTE — Progress Notes
Krystal Sullivan is a 89 y.o.  female Visit Performed:Telephone    BH Provider name: Hosie Spangle      Social Determinants of Health Follow-up    Positive Social Determinant of Health Response: Yes Visit Number: initial contact  Follow-up completed via: via telephone  Patient reported the following needs: financial  Spoke with: Krystal Sullivan  Outcome:  pending return call from pt  Resources provided: [facility], [email or web address]; other community agencies;  Follow-up: left voicemail with call back number      Left message for patient with detailed information on the reason for the outreach call is in response to the positive answers on the SDOH questionnaire given at recent doctor's visit. Encouraged patient to contact this Clinical research associate direct on private confidential phone [phone]) to discuss needs and share resources.  No message sent as My Chart does not look like it's being currently used.

## 2023-05-22 ENCOUNTER — Encounter: Admit: 2023-05-22 | Discharge: 2023-05-22 | Payer: MEDICARE

## 2023-05-22 NOTE — Progress Notes
Krystal Sullivan is a 89 y.o.  female     Visit Performed:Telephone    BH Provider name: Hosie Spangle    Additional Comments:  Pt left a message for [facility] she had received message and would like to discuss issues. Left return VM encouraging call back-direct line at [phone]. Asked if she had ability to log into My Chart.

## 2023-06-15 ENCOUNTER — Encounter: Admit: 2023-06-15 | Discharge: 2023-06-15 | Payer: MEDICARE

## 2023-06-15 NOTE — Telephone Encounter
Nurse attempted to return call to patient regarding recent lab results and medical records request, but went straight to voicemail. Per patient communications form, okay to leave detailed message. Advised patient that labs were all normal, so no follow up needed, but provided this nurse's number if have any further questions. Recommended patient give our main number a call back to speak to front desk or medical records, and they can give her information on how to request medical records to take with her when she moves to [place] on 09/17.

## 2023-06-15 NOTE — Telephone Encounter
Krystal Sullivan,  This pt is moving on the 13th to [place]. She is moving on the 13th. Could she move up to an earlier time. Perhaps a return short? She would like to see Dr A one more time before she leaves.   Thank you,  Albin Felling

## 2023-06-16 ENCOUNTER — Encounter: Admit: 2023-06-16 | Discharge: 2023-06-16 | Payer: MEDICARE

## 2023-07-13 ENCOUNTER — Ambulatory Visit: Admit: 2023-07-13 | Discharge: 2023-07-13 | Payer: MEDICARE

## 2023-07-13 ENCOUNTER — Encounter: Admit: 2023-07-13 | Discharge: 2023-07-13 | Payer: MEDICARE

## 2023-07-13 DIAGNOSIS — E119 Type 2 diabetes mellitus without complications: Secondary | ICD-10-CM

## 2023-07-13 DIAGNOSIS — H401190 Primary open-angle glaucoma, unspecified eye, stage unspecified: Secondary | ICD-10-CM

## 2023-07-13 DIAGNOSIS — H04123 Dry eye syndrome of bilateral lacrimal glands: Secondary | ICD-10-CM

## 2023-07-13 DIAGNOSIS — E1100 Type 2 diabetes mellitus with hyperosmolarity without nonketotic hyperglycemic-hyperosmolar coma (NKHHC): Secondary | ICD-10-CM

## 2023-07-13 DIAGNOSIS — M810 Age-related osteoporosis without current pathological fracture: Secondary | ICD-10-CM

## 2023-07-13 DIAGNOSIS — M270 Developmental disorders of jaws: Secondary | ICD-10-CM

## 2023-07-13 DIAGNOSIS — H401131 Primary open-angle glaucoma, bilateral, mild stage: Secondary | ICD-10-CM

## 2023-07-13 DIAGNOSIS — Z961 Presence of intraocular lens: Secondary | ICD-10-CM

## 2023-07-13 DIAGNOSIS — H52203 Unspecified astigmatism, bilateral: Secondary | ICD-10-CM

## 2023-07-13 LAB — HM DIABETES EYE EXAM

## 2023-07-13 NOTE — Progress Notes
Glaucoma History  -FH   -Tmax:   -PMH Asthma,  OSA,  migraines  -Trauma   -CCT   -Steroid use   Previous Surgery/lasers  CE IOL OU  Medications  Allergies:   Allergies   Allergen Reactions    Fentanyl RASH     Rash at injection site    Alendronate Sodium NAUSEA ONLY    Atorvastatin SEE COMMENTS     tinnitus    Simvastatin SEE COMMENTS     Ear ringing     Drops: timolol qam OU, latanoprost qhs OU  Goal IOP: determine         Assessment and Plan:    Problem   Dry Eye Syndrome of Both Eyes   Poag (Primary Open-Angle Glaucoma)   Dmii (Diabetes Mellitus, Type 2) (Hcc)           POAG (primary open-angle glaucoma)  On timolol qam + latanoprost qhs OU, good IOP today. IOP better on with addition of timolol    Pt feels vision is more stable    Pt goin to [place], [place] to be with family     DMII (diabetes mellitus, type 2) (HCC)  Does the patient have Diabetes? Yes,  however the patient did not have a retinal exam today.    Encouraged patient to control blood glucose, blood pressure and cholesterol. Discussed goal a1c of < 7.0 to reduce risk of developing or worsening diabetic retinopathy.       Dry eye syndrome of both eyes  Doing well, tears as needed           NEXT VISIT   IOP Check  in [place] to establish care       Narda Bonds, MD   [place] [facility]      HPI:  Patient presents with:  Glaucoma: 11 week FUV for POAG OU.  Patient had a recent fall 2 weeks ago.  She states her vision fluctuates often depending on what she is doing.  Patient moves to [place] in 2 weeks.  Patient is on timolol qam OU.        Exam:  Base Eye Exam       Visual Acuity (Snellen - Linear)         Right Left    Dist cc 20/25 20/25      Correction: Glasses              Tonometry (iCare Tonometer, 11:11 AM)         Right Left    Pressure 10 11              Tonometry #2 (Applanation, 11:35 AM)         Right Left    Pressure 14 12              Neuro/Psych       Oriented x3: Yes    Mood/Affect: Normal                  Slit Lamp and Fundus Exam External Exam         Right Left    External Normal Normal              Slit Lamp Exam         Right Left    Lids/Lashes MGD (thickened lid) MGD (thickened lid)    Conjunctiva/Sclera White and quiet White and quiet    Cornea Clear Clear    Anterior Chamber Deep and quiet  Deep and quiet    Iris Flat Flat    Lens PCIOL PCIOL    Anterior Vitreous syneresis syneresis              Fundus Exam         Right Left    Disc Sharp healthy rim; large ON Sharp healthy rim; large ON    C/D Ratio 0.7 0.6                  Refraction       Wearing Rx         Sphere Cylinder Axis Add    Right -1.25 +1.50 007 +2.50    Left -2.25 +1.75 175 +2.50      Type: Bifocal - Lined

## 2023-07-13 NOTE — Assessment & Plan Note
Does the patient have Diabetes? Yes,  however the patient did not have a retinal exam today.    Encouraged patient to control blood glucose, blood pressure and cholesterol. Discussed goal a1c of < 7.0 to reduce risk of developing or worsening diabetic retinopathy.

## 2023-07-13 NOTE — Assessment & Plan Note
Doing well, tears as needed

## 2023-07-24 ENCOUNTER — Encounter: Admit: 2023-07-24 | Discharge: 2023-07-24 | Payer: MEDICARE

## 2023-09-02 ENCOUNTER — Encounter: Admit: 2023-09-02 | Discharge: 2023-09-02 | Payer: MEDICARE

## 2023-09-07 ENCOUNTER — Telehealth: Payer: Self-pay | Admitting: Internal Medicine

## 2023-09-07 NOTE — Telephone Encounter (Signed)
That is ok 

## 2023-09-07 NOTE — Telephone Encounter (Signed)
Pt came in to schedule appt. Pt was formerly a pt of Paz and wants to see him again now that she has moved back. Please advise if pt can re-establish with Paz.

## 2023-09-07 NOTE — Telephone Encounter (Signed)
Please advise 

## 2023-09-23 ENCOUNTER — Encounter: Payer: Self-pay | Admitting: Internal Medicine

## 2023-09-23 ENCOUNTER — Ambulatory Visit (INDEPENDENT_AMBULATORY_CARE_PROVIDER_SITE_OTHER): Payer: Medicare HMO | Admitting: Internal Medicine

## 2023-09-23 VITALS — BP 126/72 | HR 91 | Temp 97.9°F | Resp 16 | Ht 63.0 in | Wt 173.2 lb

## 2023-09-23 DIAGNOSIS — E785 Hyperlipidemia, unspecified: Secondary | ICD-10-CM | POA: Diagnosis not present

## 2023-09-23 DIAGNOSIS — Z7984 Long term (current) use of oral hypoglycemic drugs: Secondary | ICD-10-CM

## 2023-09-23 DIAGNOSIS — F329 Major depressive disorder, single episode, unspecified: Secondary | ICD-10-CM

## 2023-09-23 DIAGNOSIS — E119 Type 2 diabetes mellitus without complications: Secondary | ICD-10-CM | POA: Diagnosis not present

## 2023-09-23 DIAGNOSIS — H409 Unspecified glaucoma: Secondary | ICD-10-CM | POA: Insufficient documentation

## 2023-09-23 DIAGNOSIS — R634 Abnormal weight loss: Secondary | ICD-10-CM | POA: Diagnosis not present

## 2023-09-23 NOTE — Patient Instructions (Signed)
   GO TO THE LAB : Get the blood work     Next visit with me in 2 months    Please schedule it at the front desk

## 2023-09-23 NOTE — Progress Notes (Signed)
Subjective:    Patient ID: [name], female    DOB: 03/02/1930, 89 y.o.   MRN: [id number]  DOS:  09/23/2023 Type of visit - description: New patient, to reestablish  The patient left [place] about 3 years ago and recently returned New diagnosis is glaucoma otherwise medications/problems are essentially the same.  She admits to weight loss, approximately 20 pounds on her scales, this happened gradually over the last year. On further questioning she has been somewhat depressed lately and her appetite is poor. Denies postprandial pain or nausea. No diarrhea or blood in the stools. No cough or night sweats. No fever or chills. No dysuria or gross hematuria.     Review of Systems See above   Past Medical History:  Diagnosis Date   Abnormal breast exam 2004   MMG neg (eval by surgeon neg)   Anemia    Arthritis    "knees" (07/11/2014)   Glaucoma    History of blood transfusion    "related to a surgery, I think"   Hx SBO 2004 , 2007, 2012    exploratory surgery 9-12    Iritis 2005   Osteoarthritis    Osteopenia    SBO (small bowel obstruction) (HCC) 07/2014   Type II diabetes mellitus (HCC)     Past Surgical History:  Procedure Laterality Date   APPENDECTOMY     BACK SURGERY  01-2019   Bilaterally L4, L5 decompression   CATARACT EXTRACTION Bilateral    DILATION AND CURETTAGE OF UTERUS     EXPLORATORY LAPAROTOMY  07/2011   SBO   TOTAL ABDOMINAL HYSTERECTOMY  1970's   for bleeding, no cancer   TOTAL HIP ARTHROPLASTY Right 06/23/2016   Procedure: TOTAL HIP ARTHROPLASTY ANTERIOR APPROACH;  Surgeon: Jodi Geralds, MD;  Location: [facility] OR;  Service: Orthopedics;  Laterality: Right;   TOTAL KNEE ARTHROPLASTY Right 10/2010   Social History   Socioeconomic History   Marital status: Widowed    Spouse name: Not on file   Number of children: 3   Years of education: Not on file   Highest education level: Not on file  Occupational History   Occupation: retired      Comment: makes aprons   Tobacco Use   Smoking status: Former   Smokeless tobacco: Never   Tobacco comments:    "60 years ago"  Vaping Use   Vaping status: Never Used  Substance and Sexual Activity   Alcohol use: No    Alcohol/week: 0.0 standard drinks of alcohol   Drug use: No   Sexual activity: Never  Other Topics Concern   Not on file  Social History Narrative   [place]ah Flesher to [place] 2021-2024, now back in [place], lives alone       her family is in [place], lost 2 sisters   Lost a son 11-2015   Daughter in [place]   Right handed   No caffeine   Social Determinants of Health   Financial Resource Strain: At Risk (05/12/2023)   Received from [facility] Strain    In the last 12 months, has your utility company shut off your service for not paying your bills?: No    Do problems getting childcare make it difficult to work or study?: No    In the last 12 months, have you needed to see a doctor, but could not because of cost?: Yes    In the last 12 months, did you skip medications to  save money?: No  Food Insecurity: Not At Risk (05/12/2023)   Received from [facility]   Hunger Vital Sign    Worried About Running Out of Food in the Last Year: Never True    Ran Out of Food in the Last Year: Never True  Transportation Needs: Not At Risk (05/12/2023)   Received from [facility]   Transportation Needs    In the past 12 months, have you ever gone without  health care because you didn't have a way to get there?: No  Physical Activity: Not on file  Stress: Low Risk  (02/11/2022)   Received from [facility]   Stress    Total Score: : 0  Social Connections: Not At Risk (05/12/2023)   Received from [facility]   Social Connections    Do you often feel that you lack companionship?: No  Intimate Partner Violence: Not At Risk (06/20/2022)   Received from [facility] Safety    Threatened: Not on file    Insulted: Not on file    Physically Hurt : Not on file    Scream: Not on file   Family History  Problem Relation Age of Onset   Diabetes Father    Breast cancer Sister    Brain cancer Sister    Sudden death Sister    Renal Disease Son    Heart attack Neg Hx    Colon cancer Neg Hx     Current Outpatient Medications  Medication Instructions   acetaminophen (TYLENOL) 650 mg, Oral, Every 8 hours PRN   Calcium Carb-Cholecalciferol (CALCIUM/VITAMIN D PO) Oral, 2400mg  Calcium1000mg  Vitamin D    docusate sodium (COLACE) 100 mg, Oral, Every 12 hours   Fish Oil 1,000 mg, Oral, Daily   glucose blood (ONE TOUCH ULTRA TEST) test strip Check blood sugar no more than twice daily   latanoprost (XALATAN) 0.005 % ophthalmic solution 1 drop, Both Eyes, Daily at bedtime   metFORMIN (GLUCOPHAGE) 500 mg, Oral, 2 times daily with meals   Multiple Vitamin (MULTIVITAMIN WITH MINERALS) TABS tablet 1 tablet, Oral, Daily   polyethylene glycol (MIRALAX / GLYCOLAX) 17 g, Oral, Daily   pravastatin (PRAVACHOL) 40 mg, Oral, Daily at bedtime   timolol (TIMOPTIC) 0.5 % ophthalmic solution 1 drop, Daily       Objective:   Physical Exam BP 126/72   Pulse 91   Temp 97.9 F (36.6 C) (Oral)   Resp 16   Ht 5\' 3"  (1.6 m)   Wt 173 lb 4 oz (78.6 kg)   SpO2 98%   BMI 30.69 kg/m  General: Well developed, NAD, BMI noted Neck: No lymphadenopathy is  HEENT:  Normocephalic . Face symmetric, atraumatic Lungs:  CTA B Normal respiratory effort, no intercostal retractions, no accessory muscle use. Heart: RRR,  no murmur.  Abdomen:  Not distended, soft, non-tender. No rebound or rigidity.   Lower extremities: no pretibial edema bilaterally  Skin: Exposed areas without rash. Not pale. Not jaundice Neurologic:  alert & oriented X3.  Speech normal, gait assisted by cane, needs help transferring. Strength symmetric and appropriate for age.  Psych: Cognition and  judgment appear intact.  Cooperative with normal attention span and concentration.  Behavior appropriate. No anxious or depressed appearing.     Assessment     Assessment DM  hyperlipidemia Osteopenia-- dexa 05-2015   T score -1.4 Osteoarthritis, chronic back pain -Dr Regino Schultze note from  09/27/2018 : MRI  central canal stenosis, L4-L5,  local injections, no much help   - S/p surgery 01/2019: very good results. Iritis Multiple SBO, exploratory surgery 2012 Tinnitus chronic, c/o HOH: saw neuro 11/2017, MRI was done, no report, then she saw ENT (Rx conservative treatment)   PLAN: To reestablish  Social: Left [place] 2021, went to [place] where her grandson lived with his family.  Her grandson moved to [place] 8 months ago and since then she has been feeling very lonely,had no friends or family there.  She decided to return to [facility] where she has a much larger social support. Labs 05/12/2023: Potassium 5.0, creatinine 0.6, LFTs normal. A1c 6.1. Hemoglobin 12.0, platelet 220. Weight loss: Lost 20 pounds gradually over the last year per patient, ROS benign, possibly related to depression, see above Plan: Check TSH, CBC with pathology smear, reassess  in 2 months. Depression: had some depression, feels better now DM: On metformin, check A1c and CMP. Hyperlipidemia: On statins, check FLP. Gait disorder: Her mobility has diminished in the last few years Osteoporosis: Has not taken Fosamax in years, reassess on RTC Aspirin: Stopped aspirin which is okay per literature. Preventive care: Had a flu shot and COVID-vaccine RTC 2 months mostly to reassess weight loss

## 2023-09-23 NOTE — Assessment & Plan Note (Signed)
To reestablish  Social: Left  2021, went to [place] where her grandson lived with his family.  Her grandson moved to [place] 8 months ago and since then she has been feeling very lonely,had no friends or family there.  She decided to return to [facility] where she has a much larger social support. Labs 05/12/2023: Potassium 5.0, creatinine 0.6, LFTs normal. A1c 6.1. Hemoglobin 12.0, platelet 220. Weight loss: Lost 20 pounds gradually over the last year per patient, ROS benign, possibly related to depression, see above Plan: Check TSH, CBC with pathology smear, reassess  in 2 months. Depression: had some depression, feels better now DM: On metformin, check A1c and CMP. Hyperlipidemia: On statins, check FLP. Gait disorder: Her mobility has diminished in the last few years Osteoporosis: Has not taken Fosamax in years, reassess on RTC Aspirin: Stopped aspirin which is okay per literature. Preventive care: Had a flu shot and COVID-vaccine RTC 2 months mostly to reassess weight loss

## 2023-09-25 LAB — CBC WITH DIFFERENTIAL/PLATELET
Absolute Lymphocytes: 3323 {cells}/uL (ref 850–3900)
Absolute Monocytes: 533 {cells}/uL (ref 200–950)
Basophils Absolute: 30 {cells}/uL (ref 0–200)
Basophils Relative: 0.4 %
Eosinophils Absolute: 68 {cells}/uL (ref 15–500)
Eosinophils Relative: 0.9 %
HCT: 37.3 % (ref 35.0–45.0)
Hemoglobin: 11.9 g/dL (ref 11.7–15.5)
MCH: 27.3 pg (ref 27.0–33.0)
MCHC: 31.9 g/dL — ABNORMAL LOW (ref 32.0–36.0)
MCV: 85.6 fL (ref 80.0–100.0)
MPV: 11.2 fL (ref 7.5–12.5)
Monocytes Relative: 7.1 %
Neutro Abs: 3548 {cells}/uL (ref 1500–7800)
Neutrophils Relative %: 47.3 %
Platelets: 252 10*3/uL (ref 140–400)
RBC: 4.36 10*6/uL (ref 3.80–5.10)
RDW: 12.9 % (ref 11.0–15.0)
Total Lymphocyte: 44.3 %
WBC: 7.5 10*3/uL (ref 3.8–10.8)

## 2023-09-25 LAB — COMPREHENSIVE METABOLIC PANEL
AG Ratio: 1.4 (calc) (ref 1.0–2.5)
ALT: 12 U/L (ref 6–29)
AST: 13 U/L (ref 10–35)
Albumin: 4.2 g/dL (ref 3.6–5.1)
Alkaline phosphatase (APISO): 52 U/L (ref 37–153)
BUN: 17 mg/dL (ref 7–25)
CO2: 27 mmol/L (ref 20–32)
Calcium: 9.7 mg/dL (ref 8.6–10.4)
Chloride: 105 mmol/L (ref 98–110)
Creat: 0.81 mg/dL (ref 0.60–0.95)
Globulin: 3 g/dL (ref 1.9–3.7)
Glucose, Bld: 131 mg/dL — ABNORMAL HIGH (ref 65–99)
Potassium: 4.8 mmol/L (ref 3.5–5.3)
Sodium: 142 mmol/L (ref 135–146)
Total Bilirubin: 0.6 mg/dL (ref 0.2–1.2)
Total Protein: 7.2 g/dL (ref 6.1–8.1)

## 2023-09-25 LAB — HEMOGLOBIN A1C
Hgb A1c MFr Bld: 6.3 %{Hb} — ABNORMAL HIGH (ref <5.7)
Mean Plasma Glucose: 134 mg/dL
eAG (mmol/L): 7.4 mmol/L

## 2023-09-25 LAB — PATHOLOGIST SMEAR REVIEW

## 2023-09-25 LAB — LIPID PANEL
Cholesterol: 181 mg/dL (ref <200)
HDL: 58 mg/dL (ref >=50)
LDL Cholesterol (Calc): 103 mg/dL — ABNORMAL HIGH
Non-HDL Cholesterol (Calc): 123 mg/dL (ref <130)
Total CHOL/HDL Ratio: 3.1 (calc) (ref <5.0)
Triglycerides: 107 mg/dL (ref <150)

## 2023-09-25 LAB — TSH: TSH: 1.84 m[IU]/L (ref 0.40–4.50)

## 2023-10-08 LAB — HM DIABETES EYE EXAM

## 2023-10-14 ENCOUNTER — Telehealth: Payer: Self-pay | Admitting: Internal Medicine

## 2023-10-14 MED ORDER — PRAVASTATIN SODIUM 40 MG PO TABS: 40.0000 mg | ORAL_TABLET | Freq: Every day | ORAL | 0 refills | Status: DC

## 2023-10-14 MED ORDER — METFORMIN HCL 500 MG PO TABS: 500.0000 mg | ORAL_TABLET | Freq: Two times a day (BID) | ORAL | 1 refills | Status: DC

## 2023-10-14 NOTE — Addendum Note (Signed)
Addended byConrad Carter D on: 10/14/2023 12:49 PM   Modules accepted: Orders

## 2023-10-14 NOTE — Telephone Encounter (Signed)
Rx sent 

## 2023-10-14 NOTE — Telephone Encounter (Signed)
Prescription Request  10/14/2023  Is this a "Controlled Substance" medicine? No  LOV: 09/23/2023  What is the name of the medication or equipment?pravastatin (PRAVACHOL) 40 MG tablet   Have you contacted your pharmacy to request a refill? No   Which pharmacy would you like this sent to?  [id number] [place], [place] - [address] [place] [address] Phone: [phone] Fax: [phone]    Patient notified that their request is being sent to the clinical staff for review and that they should receive a response within 2 business days.   Please advise at [facility] [phone]

## 2023-11-12 ENCOUNTER — Encounter: Admit: 2023-11-12 | Discharge: 2023-11-12 | Payer: MEDICARE

## 2023-11-13 ENCOUNTER — Encounter: Admit: 2023-11-13 | Discharge: 2023-11-13 | Payer: MEDICARE

## 2023-11-13 NOTE — Telephone Encounter
Called both pt and friend, Alona Bene and left message on both of their voice mail cell phone to please call back as we were just checking to make sure pt is okay as she did not show up for an appt yesterday. Will await a return call.

## 2023-11-13 NOTE — Telephone Encounter
Krystal Sullivan was scheduled for an AWV yesterday and no-showed. Can you call her and make sure everything is OK? I don't think she drives, so it may be a transportation issue, but I just want to check on her because she lives alone. If you cannot reach her, let's reach out to her friend [name], who is listed as her emergency contact. Let me know.  Thanks,  Lequita Halt, APRN

## 2023-11-13 NOTE — Telephone Encounter
Krystal Sullivan returned our call, she informed us that Krystal Sullivan has moved to [facility] as is doing well. Grateful we had called to check in on her.

## 2023-11-25 ENCOUNTER — Ambulatory Visit: Payer: Medicare HMO | Admitting: Internal Medicine

## 2023-11-29 ENCOUNTER — Encounter: Admit: 2023-11-29 | Discharge: 2023-11-29 | Payer: MEDICARE

## 2023-12-02 ENCOUNTER — Ambulatory Visit (INDEPENDENT_AMBULATORY_CARE_PROVIDER_SITE_OTHER): Payer: Medicare HMO | Admitting: Internal Medicine

## 2023-12-02 ENCOUNTER — Encounter: Payer: Self-pay | Admitting: Internal Medicine

## 2023-12-02 VITALS — BP 116/72 | HR 83 | Temp 97.6°F | Resp 16 | Ht 63.0 in | Wt 172.5 lb

## 2023-12-02 DIAGNOSIS — R634 Abnormal weight loss: Secondary | ICD-10-CM

## 2023-12-02 DIAGNOSIS — M810 Age-related osteoporosis without current pathological fracture: Secondary | ICD-10-CM

## 2023-12-02 MED ORDER — ALENDRONATE SODIUM 70 MG PO TABS: 70.0000 mg | ORAL_TABLET | ORAL | 3 refills | Status: DC

## 2023-12-02 NOTE — Progress Notes (Signed)
Subjective:    Patient ID: [name], female    DOB: Jan 08, 1930, 89 y.o.   MRN: [id number]  DOS:  12/02/2023 Type of visit - description: f/u  Here for follow-up. At the last visit we were concerned about weight loss, weight is now stable. Denies any depression or anxiety. No fever or chills.  No headaches Denies nausea vomiting.  No blood in the stools. No dysphagia.  Wt Readings from Last 3 Encounters:  12/02/23 172 lb 8 oz (78.2 kg)  09/23/23 173 lb 4 oz (78.6 kg)  05/22/20 194 lb 2 oz (88.1 kg)     Review of Systems See above   Past Medical History:  Diagnosis Date   Abnormal breast exam 2004   MMG neg (eval by surgeon neg)   Anemia    Arthritis    "knees" (07/11/2014)   Glaucoma    History of blood transfusion    "related to a surgery, I think"   Hx SBO 2004 , 2007, 2012    exploratory surgery 9-12    Iritis 2005   Osteoarthritis    Osteopenia    SBO (small bowel obstruction) (HCC) 07/2014   Type II diabetes mellitus (HCC)     Past Surgical History:  Procedure Laterality Date   APPENDECTOMY     BACK SURGERY  01-2019   Bilaterally L4, L5 decompression   CATARACT EXTRACTION Bilateral    DILATION AND CURETTAGE OF UTERUS     EXPLORATORY LAPAROTOMY  07/2011   SBO   TOTAL ABDOMINAL HYSTERECTOMY  1970's   for bleeding, no cancer   TOTAL HIP ARTHROPLASTY Right 06/23/2016   Procedure: TOTAL HIP ARTHROPLASTY ANTERIOR APPROACH;  Surgeon: Jodi Geralds, MD;  Location: [facility] OR;  Service: Orthopedics;  Laterality: Right;   TOTAL KNEE ARTHROPLASTY Right 10/2010    Current Outpatient Medications  Medication Instructions   acetaminophen (TYLENOL) 650 mg, Every 8 hours PRN   Calcium Carb-Cholecalciferol (CALCIUM/VITAMIN D PO) Take by mouth. 2400mg  Calcium 1000mg  Vitamin D   docusate sodium (COLACE) 100 mg, Oral, Every 12 hours   dorzolamide-timolol (COSOPT) 2-0.5 % ophthalmic solution 1 drop, 2 times daily   Fish Oil 1,000 mg, Daily   glucose blood (ONE TOUCH  ULTRA TEST) test strip Check blood sugar no more than twice daily   latanoprost (XALATAN) 0.005 % ophthalmic solution 1 drop, Daily at bedtime   metFORMIN (GLUCOPHAGE) 500 mg, Oral, 2 times daily with meals   Multiple Vitamin (MULTIVITAMIN WITH MINERALS) TABS tablet 1 tablet, Daily   polyethylene glycol (MIRALAX / GLYCOLAX) 17 g, Oral, Daily   pravastatin (PRAVACHOL) 40 mg, Oral, Daily at bedtime       Objective:   Physical Exam BP 116/72   Pulse 83   Temp 97.6 F (36.4 C) (Oral)   Resp 16   Ht 5\' 3"  (1.6 m)   Wt 172 lb 8 oz (78.2 kg)   SpO2 98%   BMI 30.56 kg/m  General:   Well developed, NAD, BMI noted. HEENT:  Normocephalic . Face symmetric, atraumatic Lungs:  CTA B Normal respiratory effort, no intercostal retractions, no accessory muscle use. Heart: RRR,  no murmur.  Lower extremities: no pretibial edema bilaterally  Skin: Not pale. Not jaundice Neurologic:  alert & oriented X3.  Speech normal, gait appropriate for age and assisted by a cane Psych--  Cognition and judgment appear intact.  Cooperative with normal attention span and concentration.  Behavior appropriate. No anxious or depressed appearing.  Assessment   Assessment DM  hyperlipidemia Osteopenia-- dexa 05-2015   T score -1.4 Osteoarthritis, chronic back pain -Dr Regino Schultze note from 09/27/2018 : MRI  central canal stenosis, L4-L5,  local injections, no much help   - S/p surgery 01/2019: very good results. Iritis Multiple SBO, exploratory surgery 2012 Tinnitus chronic, c/o HOH: saw neuro 11/2017, MRI was done, no report, then she saw ENT (Rx conservative treatment)   PLAN Weight loss: See last visit, labs all  good except that the pathology review showed a question of iron deficiency.  No GI symptoms, normal hemoglobin.  Weight loss has stabilized.  Reassess on RTC. DM: On metformin, last A1c very good. Osteoporosis: T-score -2.5 (March 2021) she took Fosamax temporarily.  Fosamax is on her allergies  list due to nausea.  Plan: Restart meds, precautions discussed, call if she has any problems.  Recheck a DEXA in few months. Social: She moved back to [place] several months ago, she seems to be very happy today, doing well. RTC 4 months

## 2023-12-02 NOTE — Patient Instructions (Addendum)
Start Fosamax (alendronate) 1 tablet once a week.  Take it daily in the morning on an empty stomach with 2 glasses of water.  Do not lay back  or eat for the following 40 minutes.  If you have difficulty swallowing, nausea or any other problems with the medication please let us know.     Next visit with me 4 months    Please schedule it at the front desk

## 2023-12-04 NOTE — Assessment & Plan Note (Signed)
Weight loss: See last visit, labs all  good except that the pathology review showed a question of iron deficiency.  No GI symptoms, normal hemoglobin.  Weight loss has stabilized.  Reassess on RTC. DM: On metformin, last A1c very good. Osteoporosis: T-score -2.5 (March 2021) she took Fosamax temporarily.  Fosamax is on her allergies list due to nausea.  Plan: Restart meds, precautions discussed, call if she has any problems.  Recheck a DEXA in few months. Social: She moved back to [place] several months ago, she seems to be very happy today, doing well. RTC 4 months

## 2024-01-12 ENCOUNTER — Encounter: Admit: 2024-01-12 | Discharge: 2024-01-12 | Payer: MEDICARE

## 2024-01-22 ENCOUNTER — Other Ambulatory Visit: Payer: Self-pay | Admitting: Internal Medicine

## 2024-01-22 MED ORDER — METFORMIN HCL 500 MG PO TABS: 500.0000 mg | ORAL_TABLET | Freq: Two times a day (BID) | ORAL | 1 refills | Status: DC

## 2024-01-22 NOTE — Telephone Encounter (Signed)
 Copied from CRM [id number]. Topic: Clinical - Medication Refill >> Jan 22, 2024 11:39 AM Truddie Crumble wrote: Most Recent Primary Care Visit:  Provider: Wanda Plump  Department: [facility]  Visit Type: OFFICE VISIT  Date: 12/02/2023  Medication: metFORMIN (GLUCOPHAGE) 500 MG tablet  Has the patient contacted their pharmacy? No (Agent: If no, request that the patient contact the pharmacy for the refill. If patient does not wish to contact the pharmacy document the reason why and proceed with request.) (Agent: If yes, when and what did the pharmacy advise?)  Is this the correct pharmacy for this prescription? Yes If no, delete pharmacy and type the correct one.  This is the patient's preferred pharmacy:  [id number] [place], [place] - [address] [place] [address] Phone: [phone] Fax: [phone]   Has the prescription been filled recently? No  Is the patient out of the medication? Yes  Has the patient been seen for an appointment in the last year OR does the patient have an upcoming appointment? Yes  Can we respond through MyChart? Yes  Agent: Please be advised that Rx refills may take up to 3 business days. We ask that you follow-up with your pharmacy.

## 2024-03-07 ENCOUNTER — Other Ambulatory Visit: Payer: Self-pay | Admitting: Internal Medicine

## 2024-03-08 ENCOUNTER — Ambulatory Visit: Payer: Medicare HMO | Admitting: Internal Medicine

## 2024-03-08 ENCOUNTER — Encounter: Payer: Self-pay | Admitting: Internal Medicine

## 2024-03-08 VITALS — BP 122/80 | HR 60 | Temp 97.8°F | Resp 16 | Ht 63.0 in | Wt 169.0 lb

## 2024-03-08 DIAGNOSIS — Z23 Encounter for immunization: Secondary | ICD-10-CM

## 2024-03-08 DIAGNOSIS — E785 Hyperlipidemia, unspecified: Secondary | ICD-10-CM

## 2024-03-08 DIAGNOSIS — E119 Type 2 diabetes mellitus without complications: Secondary | ICD-10-CM

## 2024-03-08 DIAGNOSIS — E1140 Type 2 diabetes mellitus with diabetic neuropathy, unspecified: Secondary | ICD-10-CM

## 2024-03-08 DIAGNOSIS — Z7984 Long term (current) use of oral hypoglycemic drugs: Secondary | ICD-10-CM

## 2024-03-08 NOTE — Progress Notes (Unsigned)
 Subjective:    Patient ID: [name], female    DOB: Dec 10, 1929, 89 y.o.   MRN: [id number]  DOS:  03/08/2024 Type of visit - description: Follow-up  Chronic medical problems addressed. Feeling well. No recent ambulatory CBGs. No nausea vomiting.  No blood in the stools. When asked, admits to feet paresthesias described as numbness, also feet feel really cold.    Wt Readings from Last 3 Encounters:  03/08/24 169 lb (76.7 kg)  12/02/23 172 lb 8 oz (78.2 kg)  09/23/23 173 lb 4 oz (78.6 kg)     Review of Systems See above   Past Medical History:  Diagnosis Date   Abnormal breast exam 2004   MMG neg (eval by surgeon neg)   Anemia    Arthritis    "knees" (07/11/2014)   Glaucoma    History of blood transfusion    "related to a surgery, I think"   Hx SBO 2004 , 2007, 2012    exploratory surgery 9-12    Iritis 2005   Osteoarthritis    Osteopenia    SBO (small bowel obstruction) (HCC) 07/2014   Type II diabetes mellitus (HCC)     Past Surgical History:  Procedure Laterality Date   APPENDECTOMY     BACK SURGERY  01-2019   Bilaterally L4, L5 decompression   CATARACT EXTRACTION Bilateral    DILATION AND CURETTAGE OF UTERUS     EXPLORATORY LAPAROTOMY  07/2011   SBO   TOTAL ABDOMINAL HYSTERECTOMY  1970's   for bleeding, no cancer   TOTAL HIP ARTHROPLASTY Right 06/23/2016   Procedure: TOTAL HIP ARTHROPLASTY ANTERIOR APPROACH;  Surgeon: Neil Balls, MD;  Location: [facility] OR;  Service: Orthopedics;  Laterality: Right;   TOTAL KNEE ARTHROPLASTY Right 10/2010    Current Outpatient Medications  Medication Instructions   acetaminophen  (TYLENOL ) 650 mg, Every 8 hours PRN   alendronate  (FOSAMAX ) 70 mg, Oral, Every 7 days, Take with a full glass of water on an empty stomach.   Calcium  Carb-Cholecalciferol (CALCIUM /VITAMIN D PO) Take by mouth. 2400mg  Calcium  1000mg  Vitamin D   docusate sodium  (COLACE) 100 mg, Oral, Every 12 hours   dorzolamide-timolol (COSOPT) 2-0.5 %  ophthalmic solution 1 drop, 2 times daily   Fish Oil 1,000 mg, Daily   glucose blood (ONE TOUCH ULTRA TEST) test strip Check blood sugar no more than twice daily   latanoprost (XALATAN) 0.005 % ophthalmic solution 1 drop, Daily at bedtime   metFORMIN  (GLUCOPHAGE ) 500 mg, Oral, 2 times daily with meals   Multiple Vitamin (MULTIVITAMIN WITH MINERALS) TABS tablet 1 tablet, Daily   polyethylene glycol (MIRALAX  / GLYCOLAX ) 17 g, Oral, Daily   pravastatin  (PRAVACHOL ) 40 mg, Oral, Daily at bedtime       Objective:   Physical Exam BP 122/80   Pulse 60   Temp 97.8 F (36.6 C) (Oral)   Resp 16   Ht 5\' 3"  (1.6 m)   Wt 169 lb (76.7 kg)   SpO2 95%   BMI 29.94 kg/m  General:   Well developed, NAD, BMI noted. HEENT:  Normocephalic . Face symmetric, atraumatic Lungs:  CTA B Normal respiratory effort, no intercostal retractions, no accessory muscle use. Heart: RRR,  no murmur.  DM foot exam: No edema, good pedal pulses and capillary refill.  Pinprick examination: Decreased sensitivity distally Skin: Not pale. Not jaundice Neurologic:  alert & oriented X3.  Speech normal, gait appropriate for age and unassisted Psych--  Cognition and judgment appear intact.  Cooperative  with normal attention span and concentration.  Behavior appropriate. No anxious or depressed appearing.      Assessment    Problem list DM  Neuropathy Dx 03/2024 Hyperlipidemia.  Lipitor and simvastatin intolerant Osteopenia-- dexa 05-2015   T score -1.4 Osteoarthritis, chronic back pain -Dr Donna Fus note from 09/27/2018 : MRI  central canal stenosis, L4-L5,  local injections, no much help   - S/p surgery 01/2019: very good results. Iritis Multiple SBO, exploratory surgery 2012 Tinnitus chronic, c/o HOH: saw neuro 11/2017, MRI was done, no report, then she saw ENT (Rx conservative treatment)   PLAN DM: On metformin , does not have glucometer, check BMP, A1c.  Follows a healthy diet. Neuropathy: New issue, when asked,  admits to numbness, exam is confirmatory, feet care discussed with patient.  She already follows very good feet care habits. Hyperlipidemia: Lipitor and simvastatin intolerant, on pravastatin , check FLP. Osteoporosis: Tolerating Fosamax  well. Mild anemia: Per chart review, recheck CBC, no GI symptoms. Weight loss: Weight loss: She lost approximately 20 pounds gradually in 2024, at the time [place] was [place].  TSH WNL.  Weight loss has stabilized. Preventive care: PNM 20 today RTC CPX 4 months

## 2024-03-08 NOTE — Patient Instructions (Signed)
 Please read the instructions above with care below    GO TO THE LAB : Get the blood work     Next office visit for a physical exam in 4 months Please make an appointment before you leave today    Diabetes Mellitus and Foot Care Diabetes, also called diabetes mellitus, may cause problems with your feet and legs because of poor blood flow (circulation). Poor circulation may make your skin: Become thinner and drier. Break more easily. Heal more slowly. Peel and crack. You may also have nerve damage (neuropathy). This can cause decreased feeling in your legs and feet. This means that you may not notice minor injuries to your feet that could lead to more serious problems. Finding and treating problems early is the best way to prevent future foot problems. How to care for your feet Foot hygiene  Wash your feet daily with warm water and mild soap. Do not use hot water. Then, pat your feet and the areas between your toes until they are fully dry. Do not soak your feet. This can dry your skin. Trim your toenails straight across. Do not dig under them or around the cuticle. File the edges of your nails with an emery board or nail file. Apply a moisturizing lotion or petroleum jelly to the skin on your feet and to dry, brittle toenails. Use lotion that does not contain alcohol and is unscented. Do not apply lotion between your toes. Shoes and socks Wear clean socks or stockings every day. Make sure they are not too tight. Do not wear knee-high stockings. These may decrease blood flow to your legs. Wear shoes that fit well and have enough cushioning. Always look in your shoes before you put them on to be sure there are no objects inside. To break in new shoes, wear them for just a few hours a day. This prevents injuries on your feet. Wounds, scrapes, corns, and calluses  Check your feet daily for blisters, cuts, bruises, sores, and redness. If you cannot see the bottom of your feet, use a mirror  or ask someone for help. Do not cut off corns or calluses or try to remove them with medicine. If you find a minor scrape, cut, or break in the skin on your feet, keep it and the skin around it clean and dry. You may clean these areas with mild soap and water. Do not clean the area with peroxide, alcohol, or iodine. If you have a wound, scrape, corn, or callus on your foot, look at it several times a day to make sure it is healing and not infected. Check for: Redness, swelling, or pain. Fluid or blood. Warmth. Pus or a bad smell. General tips Do not cross your legs. This may decrease blood flow to your feet. Do not use heating pads or hot water bottles on your feet. They may burn your skin. If you have lost feeling in your feet or legs, you may not know this is happening until it is too late. Protect your feet from hot and cold by wearing shoes, such as at the beach or on hot pavement. Schedule a complete foot exam at least once a year or more often if you have foot problems. Report any cuts, sores, or bruises to your health care provider right away. Where to find more information American Diabetes Association: diabetes.org Association of Diabetes Care & Education Specialists: diabeteseducator.org Contact a health care provider if: You have a condition that increases your risk of infection,  and you have any cuts, sores, or bruises on your feet. You have an injury that is not healing. You have redness on your legs or feet. You feel burning or tingling in your legs or feet. You have pain or cramps in your legs and feet. Your legs or feet are numb. Your feet always feel cold. You have pain around any toenails. Get help right away if: You have a wound, scrape, corn, or callus on your foot and: You have signs of infection. You have a fever. You have a red line going up your leg. This information is not intended to replace advice given to you by your health care provider. Make sure you  discuss any questions you have with your health care provider. Document Revised: 04/23/2022 Document Reviewed: 04/23/2022 Elsevier Patient Education  2024 [employer].

## 2024-03-09 ENCOUNTER — Other Ambulatory Visit

## 2024-03-09 ENCOUNTER — Telehealth: Payer: Self-pay | Admitting: *Deleted

## 2024-03-09 NOTE — Assessment & Plan Note (Signed)
 DM: On metformin , does not have glucometer, check BMP, A1c.  Follows a healthy diet. Neuropathy: New issue, when asked, admits to numbness, exam is confirmatory, feet care discussed with patient.  She already follows very good feet care habits. Hyperlipidemia: Lipitor and simvastatin intolerant, on pravastatin , check FLP. Osteoporosis: Tolerating Fosamax  well. Mild anemia: Per chart review, recheck CBC, no GI symptoms. Weight loss: Weight loss: She lost approximately 20 pounds gradually in 2024, at the time he was living [place] the [place].  TSH WNL.  Weight loss has stabilized. Preventive care: PNM 20 today RTC CPX 4 months

## 2024-03-09 NOTE — Telephone Encounter (Signed)
 Kaylyn-- are able to speak with patient about below situation / concern?  Pt was in the office yesterday.  She was very dehydrated and blood draw was not successful. We put her on the lab schedule for today.  Pt did not want to sign today during registration process when being checked in for the lab visit.  Pt expressed concern about being charged twice.  I assured pt that signing the registration pad would not generate a charge for today's visit. It was a part of the process for checking her in for the lab visit today. I also assured her that there would not be a duplicate venipuncture fee placed today since she was having to come back for a redraw.  Pt stated she would not sign and said to forget doing the labs if we wouldn't do them without her signing.   Kaylyn,  are you able to speak with pt and let her know that her labs will be billed to her insurance company if she chooses to return for the lab draw and that will not be a duplicate charge since they have not been billed yet as we were not able to collect the blood at her initial visit.  Because of that she would still need to sign the registration pad at check in for the lab redraw if she would be willing to return. There will not be another venipuncture fee added to the lab visit since we applied that at yesterday's visit.

## 2024-03-09 NOTE — Telephone Encounter (Signed)
 FYI- Pt refused labs.

## 2024-03-10 NOTE — Telephone Encounter (Signed)
 Will defer to [place] or Shelvy Dickens.

## 2024-03-10 NOTE — Telephone Encounter (Signed)
 Copied from CRM [id number]. Topic: Complaint (DO NOT CONVERT) - Staff >> Mar 10, 2024 10:05 AM [name] wrote: Date of encounter: 03/09/2024 Details of complaint: Patient went to her lab appointment yesterday, she states when she arrived at the office she was treated terribly. She did not do the labs. How would the patient like to see it resolved?  She would like to speak with either Dr.Paz's nurse or office manager. On a scale of 1-10, how was your experience? 1 What would it take to bring it to a 10? Being able to have her labs done and speaking with an Print production planner.   Route to Research officer, political party.

## 2024-03-11 NOTE — Telephone Encounter (Signed)
 Tried to reach pt by phone, could not leave massage due to no voicemail set up.   I also spoke to pt when she came in for her appt along with Katie. We both were just trying to check her in. Pt was very argumentative, that we did not know what we were doing stating that she did not have to check in due to being here yesterday and would not even walk up to the counter. I then walked around to try and explain introducing myself as the office manager and that we just needed to check her in and get her signature for the AOB. She yelled back "I'm not signing anything" and  stated that I needed to talk to the lab girl because she just spoke to them/which was E2C2/Yvonne stating that she was going to be late for the appt. I then went to [place].

## 2024-07-12 ENCOUNTER — Ambulatory Visit: Admitting: Internal Medicine

## 2024-07-12 ENCOUNTER — Ambulatory Visit

## 2024-07-15 ENCOUNTER — Ambulatory Visit: Payer: Self-pay

## 2024-07-15 DIAGNOSIS — L03116 Cellulitis of left lower limb: Secondary | ICD-10-CM | POA: Diagnosis not present

## 2024-07-15 NOTE — Telephone Encounter (Signed)
 FYI Only or Action Required?: FYI only for provider.  Patient was last seen in primary care on 03/08/2024 by Amon Aloysius BRAVO, MD.  Called Nurse Triage reporting Insect Bite.  Symptoms began yesterday.  Interventions attempted: OTC medications: hydrocortisone.  Symptoms are: gradually worsening.  Triage Disposition: See Physician Within 24 Hours  Patient/caregiver understands and will follow disposition?: Yes   Copied from CRM #[id number]. Topic: Clinical - Red Word Triage >> Jul 15, 2024  3:56 PM Harlene ORN wrote: Red Word that prompted transfer to Nurse Triage: patient suspects that she was bitten by something. cluster in her left inside ankle. Becoming more red and more painful. Noticed it yesterday. Reason for Disposition  [1] Red or very tender (to touch) area AND [2] started over 24 hours after the bite  Answer Assessment - Initial Assessment Questions Additional info: Patient noted insect bite mark to left ankle, small cluster that is red and puffy this morning, she applied Benadryl  cream for itch which helped, calling now for concern that redness is becoming dark and now her ankle is starting to swell. No SDV available they will proceed to urgent care now.     1. TYPE of INSECT: What type of insect was it?      unsure 2. ONSET: When did you get bitten?      2 nights ago 3. LOCATION: Where is the insect bite located?      Left ankle  4. REDNESS: Is the area red or pink? If Yes, ask: What size is the area of redness? (inches or cm). When did the redness start?     Dark red 5. PAIN: Is there any pain? If Yes, ask: How bad is the pain? (Scale 0-10; or none, mild, moderate, severe)     tender 6. ITCHING: Does it itch? If Yes, ask: How bad is the itch?      yes 7. SWELLING: How big is the swelling? (e.g., inches, cm, or compare to coins)     moderate 8. OTHER SYMPTOMS: Do you have any other symptoms?  (e.g., difficulty breathing, fever, hives)     Ankle  swelling  Protocols used: Insect Bite-A-AH

## 2024-07-22 ENCOUNTER — Ambulatory Visit (HOSPITAL_BASED_OUTPATIENT_CLINIC_OR_DEPARTMENT_OTHER)
Admission: RE | Admit: 2024-07-22 | Discharge: 2024-07-22 | Disposition: A | Discharge status: Home / Self Care | Source: Ambulatory Visit | Attending: Internal Medicine | Admitting: Internal Medicine

## 2024-07-22 ENCOUNTER — Ambulatory Visit: Payer: Self-pay | Admitting: Internal Medicine

## 2024-07-22 ENCOUNTER — Encounter: Payer: Self-pay | Admitting: Internal Medicine

## 2024-07-22 ENCOUNTER — Ambulatory Visit (INDEPENDENT_AMBULATORY_CARE_PROVIDER_SITE_OTHER): Admitting: Internal Medicine

## 2024-07-22 VITALS — BP 130/68 | HR 68 | Temp 98.3°F | Resp 16 | Ht 63.0 in | Wt 167.2 lb

## 2024-07-22 DIAGNOSIS — M47812 Spondylosis without myelopathy or radiculopathy, cervical region: Secondary | ICD-10-CM | POA: Insufficient documentation

## 2024-07-22 DIAGNOSIS — W19XXXA Unspecified fall, initial encounter: Secondary | ICD-10-CM | POA: Diagnosis not present

## 2024-07-22 DIAGNOSIS — R202 Paresthesia of skin: Secondary | ICD-10-CM | POA: Diagnosis not present

## 2024-07-22 DIAGNOSIS — M4802 Spinal stenosis, cervical region: Secondary | ICD-10-CM | POA: Diagnosis not present

## 2024-07-22 DIAGNOSIS — M25512 Pain in left shoulder: Secondary | ICD-10-CM | POA: Diagnosis not present

## 2024-07-22 DIAGNOSIS — E785 Hyperlipidemia, unspecified: Secondary | ICD-10-CM

## 2024-07-22 DIAGNOSIS — Z23 Encounter for immunization: Secondary | ICD-10-CM

## 2024-07-22 DIAGNOSIS — R296 Repeated falls: Secondary | ICD-10-CM

## 2024-07-22 DIAGNOSIS — E1140 Type 2 diabetes mellitus with diabetic neuropathy, unspecified: Secondary | ICD-10-CM | POA: Diagnosis not present

## 2024-07-22 DIAGNOSIS — R269 Unspecified abnormalities of gait and mobility: Secondary | ICD-10-CM

## 2024-07-22 DIAGNOSIS — Z7984 Long term (current) use of oral hypoglycemic drugs: Secondary | ICD-10-CM

## 2024-07-22 LAB — COMPREHENSIVE METABOLIC PANEL WITH GFR
ALT: 12 U/L (ref 0–35)
AST: 17 U/L (ref 0–37)
Albumin: 4.4 g/dL (ref 3.5–5.2)
Alkaline Phosphatase: 40 U/L (ref 39–117)
BUN: 21 mg/dL (ref 6–23)
CO2: 29 meq/L (ref 19–32)
Calcium: 9.8 mg/dL (ref 8.4–10.5)
Chloride: 105 meq/L (ref 96–112)
Creatinine, Ser: 0.68 mg/dL (ref 0.40–1.20)
GFR: 75.88 mL/min (ref >60.00)
Glucose, Bld: 99 mg/dL (ref 70–99)
Potassium: 4.6 meq/L (ref 3.5–5.1)
Sodium: 141 meq/L (ref 135–145)
Total Bilirubin: 0.8 mg/dL (ref 0.2–1.2)
Total Protein: 7.3 g/dL (ref 6.0–8.3)

## 2024-07-22 LAB — LIPID PANEL
Cholesterol: 164 mg/dL (ref 0–200)
HDL: 50.7 mg/dL (ref >39.00)
LDL Cholesterol: 101 mg/dL — ABNORMAL HIGH (ref 0–99)
NonHDL: 113.49
Total CHOL/HDL Ratio: 3
Triglycerides: 64 mg/dL (ref 0.0–149.0)
VLDL: 12.8 mg/dL (ref 0.0–40.0)

## 2024-07-22 LAB — CBC WITH DIFFERENTIAL/PLATELET
Basophils Absolute: 0.1 K/uL (ref 0.0–0.1)
Basophils Relative: 0.9 % (ref 0.0–3.0)
Eosinophils Absolute: 0.1 K/uL (ref 0.0–0.7)
Eosinophils Relative: 1.5 % (ref 0.0–5.0)
HCT: 37.1 % (ref 36.0–46.0)
Hemoglobin: 11.7 g/dL — ABNORMAL LOW (ref 12.0–15.0)
Lymphocytes Relative: 39.1 % (ref 12.0–46.0)
Lymphs Abs: 2.4 K/uL (ref 0.7–4.0)
MCHC: 31.6 g/dL (ref 30.0–36.0)
MCV: 84.8 fl (ref 78.0–100.0)
Monocytes Absolute: 0.4 K/uL (ref 0.1–1.0)
Monocytes Relative: 7.2 % (ref 3.0–12.0)
Neutro Abs: 3.2 K/uL (ref 1.4–7.7)
Neutrophils Relative %: 51.3 % (ref 43.0–77.0)
Platelets: 199 K/uL (ref 150.0–400.0)
RBC: 4.37 Mil/uL (ref 3.87–5.11)
RDW: 14.4 % (ref 11.5–15.5)
WBC: 6.2 K/uL (ref 4.0–10.5)

## 2024-07-22 LAB — HEMOGLOBIN A1C: Hgb A1c MFr Bld: 6.6 % — ABNORMAL HIGH (ref 4.6–6.5)

## 2024-07-22 MED ORDER — PRAVASTATIN SODIUM 40 MG PO TABS: 40.0000 mg | ORAL_TABLET | Freq: Every day | ORAL | 1 refills | Status: AC

## 2024-07-22 NOTE — Assessment & Plan Note (Signed)
(  Did not pursue blood work at the last visit.) DM with neuropathy: On metformin , no amb CBGs.  Checking A1c, CMP. Hyperlipidemia: On pravastatin , intolerant to Lipitor, simvastatin.Checking labs, RF sent. Fall, initial encounter:  Had 2 falls this summer, see HPI.   -After the second mechanical fall has developed left shoulder with radiation to the L arm. - Denies neck pain however she did report some pain at the neck when she turned head to the right.  There is a subtle weakness of the  L arm.  No lower extremity paresthesias. Plan: Stat cervical spine x-ray, Ortho referral.  PT referral.  Fall prevention discussed. Mild anemia, per chart review, no GI symptoms.  Checking a CBC Preventive care: Flu shot today, recommend COVID booster. RTC 3 months CPX

## 2024-07-22 NOTE — Patient Instructions (Addendum)
 You got your flu shot today.  Recommend to get a COVID-vaccine  Go to the first floor, get neck x-ray.  We are referring you to the orthopedic doctor in reference to the left shoulder and left arm pain.  We are referring you to do physical therapy to prevent the next fall.    GO TO THE LAB :  Get the blood work   Your results will be posted on MyChart with my comments  Go to the front desk for the checkout Please make an appointment physical exam in 3 months   Fall Prevention in the Home, Adult Falls can cause injuries and affect people of all ages. There are many simple things that you can do to make your home safe and to help prevent falls. If you need it, ask for help making these changes. What actions can I take to prevent falls? General information Use good lighting in all rooms. Make sure to: Replace any light bulbs that burn out. Turn on lights if it is dark and use night-lights. Keep items that you use often in easy-to-reach places. Lower the shelves around your home if needed. Move furniture so that there are clear paths around it. Do not keep throw rugs or other things on the floor that can make you trip. If any of your floors are uneven, fix them. Add color or contrast paint or tape to clearly mark and help you see: Grab bars or handrails. First and last steps of staircases. Where the edge of each step is. If you use a ladder or stepladder: Make sure that it is fully opened. Do not climb a closed ladder. Make sure the sides of the ladder are locked in place. Have someone hold the ladder while you use it. Know where your pets are as you move through your home. What can I do in the bathroom?     Keep the floor dry. Clean up any water that is on the floor right away. Remove soap buildup in the bathtub or shower. Buildup makes bathtubs and showers slippery. Use non-skid mats or decals on the floor of the bathtub or shower. Attach bath mats securely with  double-sided, non-slip rug tape. If you need to sit down while you are in the shower, use a non-slip stool. Install grab bars by the toilet and in the bathtub and shower. Do not use towel bars as grab bars. What can I do in the bedroom? Make sure that you have a light by your bed that is easy to reach. Do not use any sheets or blankets on your bed that hang to the floor. Have a firm bench or chair with side arms that you can use for support when you get dressed. What can I do in the kitchen? Clean up any spills right away. If you need to reach something above you, use a sturdy step stool that has a grab bar. Keep electrical cables out of the way. Do not use floor polish or wax that makes floors slippery. What can I do with my stairs? Do not leave anything on the stairs. Make sure that you have a light switch at the top and the bottom of the stairs. Have them installed if you do not have them. Make sure that there are handrails on both sides of the stairs. Fix handrails that are broken or loose. Make sure that handrails are as long as the staircases. Install non-slip stair treads on all stairs in your home if they do  not have carpet. Avoid having throw rugs at the top or bottom of stairs, or secure the rugs with carpet tape to prevent them from moving. Choose a carpet design that does not hide the edge of steps on the stairs. Make sure that carpet is firmly attached to the stairs. Fix any carpet that is loose or worn. What can I do on the outside of my home? Use bright outdoor lighting. Repair the edges of walkways and driveways and fix any cracks. Clear paths of anything that can make you trip, such as tools or rocks. Add color or contrast paint or tape to clearly mark and help you see high doorway thresholds. Trim any bushes or trees on the main path into your home. Check that handrails are securely fastened and in good repair. Both sides of all steps should have handrails. Install  guardrails along the edges of any raised decks or porches. Have leaves, snow, and ice cleared regularly. Use sand, salt, or ice melt on walkways during winter months if you live where there is ice and snow. In the garage, clean up any spills right away, including grease or oil spills. What other actions can I take? Review your medicines with your health care provider. Some medicines can make you confused or feel dizzy. This can increase your chance of falling. Wear closed-toe shoes that fit well and support your feet. Wear shoes that have rubber soles and low heels. Use a cane, walker, scooter, or crutches that help you move around if needed. Talk with your provider about other ways that you can decrease your risk of falls. This may include seeing a physical therapist to learn to do exercises to improve movement and strength. Where to find more information Centers for Disease Control and Prevention, [employer]: TonerPromos.no [employer] on Aging: BaseRingTones.pl National Institute on Aging: BaseRingTones.pl Contact a health care provider if: You are afraid of falling at home. You feel weak, drowsy, or dizzy at home. You fall at home. Get help right away if you: Lose consciousness or have trouble moving after a fall. Have a fall that causes a head injury. These symptoms may be an emergency. Get help right away. Call 911. Do not wait to see if the symptoms will go away. Do not drive yourself to the hospital. This information is not intended to replace advice given to you by your health care provider. Make sure you discuss any questions you have with your health care provider. Document Revised: 06/23/2022 Document Reviewed: 06/23/2022 Elsevier Patient Education  2024 [employer].

## 2024-07-22 NOTE — Progress Notes (Signed)
 Subjective:    Patient ID: Naomie ONEIDA Satchel, female    DOB: [birth year], 89 y.o.   MRN: [id number]  DOS:  07/22/2024 Type of visit - description: Follow-up  During the summer had 2 falls. She was sitting down and the chair rolled , fell to the floor with no consequence.  The second fall was August 2025, she was cleaning the toilet and had a mechanical fall. Landed on her left shoulder.  No LOC, did not have any medical evaluation. Since then is having pain around the left shoulder with radiation to the left arm almost to the wrist. Also have some pain at the second left finger. Denies any lower extremity paresthesias  No chest pain no difficulty breathing. No edema No nausea vomiting or diarrhea.  Wt Readings from Last 3 Encounters:  07/22/24 167 lb 4 oz (75.9 kg)  03/08/24 169 lb (76.7 kg)  12/02/23 172 lb 8 oz (78.2 kg)     Review of Systems See above   Past Medical History:  Diagnosis Date   Abnormal breast exam 2004   MMG neg (eval by surgeon neg)   Anemia    Arthritis    knees (07/11/2014)   Glaucoma    History of blood transfusion    related to a surgery, I think   Hx SBO 2004 , 2007, 2012    exploratory surgery 9-12    Iritis 2005   Osteoarthritis    Osteopenia    SBO (small bowel obstruction) (HCC) 07/2014   Type II diabetes mellitus (HCC)     Past Surgical History:  Procedure Laterality Date   APPENDECTOMY     BACK SURGERY  01-2019   Bilaterally L4, L5 decompression   CATARACT EXTRACTION Bilateral    DILATION AND CURETTAGE OF UTERUS     EXPLORATORY LAPAROTOMY  07/2011   SBO   TOTAL ABDOMINAL HYSTERECTOMY  1970's   for bleeding, no cancer   TOTAL HIP ARTHROPLASTY Right 06/23/2016   Procedure: TOTAL HIP ARTHROPLASTY ANTERIOR APPROACH;  Surgeon: Norleen Gavel, MD;  Location: [facility] OR;  Service: Orthopedics;  Laterality: Right;   TOTAL KNEE ARTHROPLASTY Right 10/2010    Current Outpatient Medications  Medication Instructions   acetaminophen  (TYLENOL )  650 mg, Every 8 hours PRN   alendronate  (FOSAMAX ) 70 mg, Oral, Every 7 days, Take with a full glass of water on an empty stomach.   Calcium  Carb-Cholecalciferol (CALCIUM /VITAMIN D PO) Take by mouth. 2400mg  Calcium  1000mg  Vitamin D   cephALEXin (KEFLEX) 500 mg, 3 times daily   docusate sodium  (COLACE) 100 mg, Oral, Every 12 hours   dorzolamide-timolol (COSOPT) 2-0.5 % ophthalmic solution 1 drop, 2 times daily   Fish Oil 1,000 mg, Daily   glucose blood (ONE TOUCH ULTRA TEST) test strip Check blood sugar no more than twice daily   latanoprost (XALATAN) 0.005 % ophthalmic solution 1 drop, Daily at bedtime   metFORMIN  (GLUCOPHAGE ) 500 mg, Oral, 2 times daily with meals   Multiple Vitamin (MULTIVITAMIN WITH MINERALS) TABS tablet 1 tablet, Daily   polyethylene glycol (MIRALAX  / GLYCOLAX ) 17 g, Oral, Daily   pravastatin  (PRAVACHOL ) 40 mg, Oral, Daily at bedtime       Objective:   Physical Exam BP 130/68   Pulse 68   Temp 98.3 F (36.8 C) (Oral)   Resp 16   Ht 5' 3 (1.6 m)   Wt 167 lb 4 oz (75.9 kg)   SpO2 97%   BMI 29.63 kg/m  General:   Well developed,  NAD, BMI noted. HEENT:  Normocephalic . Face symmetric, atraumatic Neck: No TTP of the cervical spine, range of motion is okay, did report pain at the base of the left neck radiating to the shoulder when she turn to the right Lungs:  CTA B Normal respiratory effort, no intercostal retractions, no accessory muscle use. Heart: RRR,  no murmur.  Lower extremities: no pretibial edema bilaterally  Skin: Not pale. Not jaundice Neurologic:  alert & oriented X3.  Speech normal, gait assisted by walker.  Has trouble transferring to the table by herself.  Needed help. DTR's symmetric. Very subtle diminished of the left hand grip Psych--  Cognition and judgment appear intact.  Cooperative with normal attention span and concentration.  Behavior appropriate. No anxious or depressed appearing.      Assessment     Problem list DM   Neuropathy Dx 03/2024 Hyperlipidemia.  Lipitor and simvastatin intolerant Osteopenia-- dexa 05-2015   T score -1.4 Osteoarthritis, chronic back pain -Dr Cesario note from 09/27/2018 : MRI  central canal stenosis, L4-L5,  local injections, no much help   - S/p surgery 01/2019: very good results. Iritis Multiple SBO, exploratory surgery 2012 Tinnitus chronic, c/o HOH: saw neuro 11/2017, MRI was done, no report, then she saw ENT (Rx conservative treatment)   PLAN (Did not pursue blood work at the last visit.) DM with neuropathy: On metformin , no amb CBGs.  Checking A1c, CMP. Hyperlipidemia: On pravastatin , intolerant to Lipitor, simvastatin.Checking labs, RF sent. Fall, initial encounter:  Had 2 falls this summer, see HPI.   -After the second mechanical fall has developed left shoulder with radiation to the L arm. - Denies neck pain however she did report some pain at the neck when she turned head to the right.  There is a subtle weakness of the  L arm.  No lower extremity paresthesias. Plan: Stat cervical spine x-ray, Ortho referral.  PT referral.  Fall prevention discussed. Mild anemia, per chart review, no GI symptoms.  Checking a CBC Preventive care: Flu shot today, recommend COVID booster. RTC 3 months CPX

## 2024-07-26 ENCOUNTER — Encounter: Payer: Self-pay | Admitting: Internal Medicine

## 2024-07-26 NOTE — Telephone Encounter (Signed)
 Pt doesn't use Mychart. Results mailed.

## 2024-07-29 ENCOUNTER — Telehealth: Payer: Self-pay | Admitting: Internal Medicine

## 2024-07-29 NOTE — Telephone Encounter (Signed)
 Chart not reviewed. Results were mailed to Pt on 07/26/24, she should be receiving in the next several days. Error CRM sent.

## 2024-07-29 NOTE — Telephone Encounter (Signed)
 Copied from CRM [id number]. Topic: Clinical - Lab/Test Results >> Jul 29, 2024 12:36 PM Alfonso HERO wrote: Reason for CRM: patient called requesting someone to call her with her test results.

## 2024-08-04 ENCOUNTER — Telehealth: Payer: Self-pay

## 2024-08-04 NOTE — Telephone Encounter (Signed)
 Called pt relayed results also gave her orthos number. She was very understanding. Says she will call us  back if they do not accept her insurance  Copied from CRM [id number]. Topic: Clinical - Lab/Test Results >> Aug 04, 2024  3:27 PM Drema MATSU wrote: Reason for CRM: Patient is requesting a callback regarding imaging results.

## 2024-08-16 DIAGNOSIS — M79642 Pain in left hand: Secondary | ICD-10-CM | POA: Diagnosis not present

## 2024-08-16 DIAGNOSIS — M65322 Trigger finger, left index finger: Secondary | ICD-10-CM | POA: Diagnosis not present

## 2024-09-12 DIAGNOSIS — H35371 Puckering of macula, right eye: Secondary | ICD-10-CM | POA: Diagnosis not present

## 2024-09-12 DIAGNOSIS — H43822 Vitreomacular adhesion, left eye: Secondary | ICD-10-CM | POA: Diagnosis not present

## 2024-09-12 DIAGNOSIS — H401133 Primary open-angle glaucoma, bilateral, severe stage: Secondary | ICD-10-CM | POA: Diagnosis not present

## 2024-09-12 DIAGNOSIS — H04123 Dry eye syndrome of bilateral lacrimal glands: Secondary | ICD-10-CM | POA: Diagnosis not present

## 2024-09-12 DIAGNOSIS — Z961 Presence of intraocular lens: Secondary | ICD-10-CM | POA: Diagnosis not present

## 2024-10-11 ENCOUNTER — Telehealth: Payer: Self-pay

## 2024-10-11 DIAGNOSIS — R269 Unspecified abnormalities of gait and mobility: Secondary | ICD-10-CM

## 2024-10-11 DIAGNOSIS — E1140 Type 2 diabetes mellitus with diabetic neuropathy, unspecified: Secondary | ICD-10-CM

## 2024-10-11 DIAGNOSIS — Z9181 History of falling: Secondary | ICD-10-CM

## 2024-10-11 NOTE — Telephone Encounter (Signed)
 Copied from CRM #[id number]. Topic: Appointments - Scheduling Inquiry for Clinic >> Oct 11, 2024 12:54 PM Victoria A wrote: Reason for CRM: Patient would like for Lolita to call her regarding AWV-she said that the nurse came to her home to do that appointment. Please call [phone] (M)

## 2024-10-11 NOTE — Telephone Encounter (Addendum)
 Spoke with pt. She states insurance did her AWV in her home 2 to 3 months ago and she gave results to Dr Amon. She would like to cancel current AWV on 12/23. She declines to schedule AWV for next year stating it is too far out and she won't remember it.  She also states she is needing assistance with household cleaning. Pt has diabetes w/neuropathy, hx of falls, gait disorder, OA.  VBCI referral placed for care management and social work. Pt notified.

## 2024-10-12 ENCOUNTER — Other Ambulatory Visit: Payer: Self-pay | Admitting: Internal Medicine

## 2024-10-18 ENCOUNTER — Telehealth: Payer: Self-pay

## 2024-10-18 NOTE — Progress Notes (Signed)
 Complex Care Management Note  Care Guide Note 10/18/2024 Name: MULKI ROESLER MRN: [id number] DOB: 1929-11-26  SIAN ROCKERS is a 89 y.o. year old female who sees Amon, Aloysius BRAVO, MD for primary care. I reached out to Naomie ONEIDA Satchel by phone today to offer complex care management services.  Ms. Celmer was given information about Complex Care Management services today including:   The Complex Care Management services include support from the care team which includes your Nurse Care Manager, Clinical Social Worker, or Pharmacist.  The Complex Care Management team is here to help remove barriers to the health concerns and goals most important to you. Complex Care Management services are voluntary, and the patient may decline or stop services at any time by request to their care team member.   Complex Care Management Consent Status: Patient agreed to services and verbal consent obtained.   Follow up plan:  Telephone appointment with complex care management team member scheduled for:  10/22/24 at 1:00 p.m. & 3:00 p.m.   Encounter Outcome:  Patient Scheduled  Dreama Lynwood [facility]  [facility], [facility] VBCI Assistant Direct Dial: [phone]  Fax: [phone]

## 2024-10-18 NOTE — Progress Notes (Signed)
 Complex Care Management Note Care Guide Note  10/18/2024 Name: [name] MRN: [id number] DOB: 1929-12-17   Complex Care Management Outreach Attempts: An unsuccessful telephone outreach was attempted today to offer the patient information about available complex care management services.  Follow Up Plan:  Additional outreach attempts will be made to offer the patient complex care management information and services.   Encounter Outcome:  No Answer  Dreama Lynwood [facility]  [facility], [facility] VBCI Assistant Direct Dial: [phone]  Fax: [phone]

## 2024-10-25 ENCOUNTER — Ambulatory Visit (INDEPENDENT_AMBULATORY_CARE_PROVIDER_SITE_OTHER): Admitting: Internal Medicine

## 2024-10-25 ENCOUNTER — Ambulatory Visit

## 2024-10-25 ENCOUNTER — Encounter: Payer: Self-pay | Admitting: Internal Medicine

## 2024-10-25 VITALS — BP 128/80 | HR 74 | Temp 98.0°F | Resp 18 | Ht 63.0 in | Wt 164.2 lb

## 2024-10-25 DIAGNOSIS — Z7984 Long term (current) use of oral hypoglycemic drugs: Secondary | ICD-10-CM | POA: Diagnosis not present

## 2024-10-25 DIAGNOSIS — E785 Hyperlipidemia, unspecified: Secondary | ICD-10-CM

## 2024-10-25 DIAGNOSIS — Z0000 Encounter for general adult medical examination without abnormal findings: Secondary | ICD-10-CM | POA: Diagnosis not present

## 2024-10-25 DIAGNOSIS — E1140 Type 2 diabetes mellitus with diabetic neuropathy, unspecified: Secondary | ICD-10-CM | POA: Diagnosis not present

## 2024-10-25 DIAGNOSIS — M810 Age-related osteoporosis without current pathological fracture: Secondary | ICD-10-CM | POA: Diagnosis not present

## 2024-10-25 DIAGNOSIS — Z0001 Encounter for general adult medical examination with abnormal findings: Secondary | ICD-10-CM

## 2024-10-25 LAB — HEMOGLOBIN A1C: Hgb A1c MFr Bld: 6.3 % (ref 4.6–6.5)

## 2024-10-25 LAB — VITAMIN D 25 HYDROXY (VIT D DEFICIENCY, FRACTURES): VITD: 39.87 ng/mL (ref 30.00–100.00)

## 2024-10-25 NOTE — Assessment & Plan Note (Signed)
 Here for CPX Other issues: DM with neuropathy: On metformin  BID, no recent glucose monitoring. Ordered A1c test and encouraged healthy diet. Hyperlipidemia Managed with pravastatin . LDL 101 mg/dL. Intolerant to Lipitor and simvastatin.  No change Age-related osteoporosis T-score -2.06 January 2020.  Has been taking Fosamax  regularly for about a year.  On multivitamin daily. Plan: Check DEXA and vitamin levels. History of DJD: Mild symptoms, currently not taking any pain medication H/o falls: Since the last office visit, no further falls, she feels well , feels steady on her feet RTC 4 to 5 months

## 2024-10-25 NOTE — Patient Instructions (Signed)
 GO TO THE LAB :  Get the blood work    Then, go to the front desk for the checkout Please make an appointment for a checkup in 4 months   STOP BY THE FIRST FLOOR: Arrange for a bone density test  Please consider getting the COVID-vaccine and RSV if not done recently    TYPE 2 DIABETES MELLITUS WITH DIABETIC NEUROPATHY: You have type 2 diabetes, which is being managed with metformin . -Continue taking metformin  500 mg orally twice daily with meals. -We have ordered an A1c test to check your blood sugar levels. -Continue following a healthy diet.  HYPERLIPIDEMIA: You have high cholesterol, which is being managed with pravastatin . -Continue taking pravastatin  40 mg orally daily at bedtime.  AGE-RELATED OSTEOPOROSIS: You have osteoporosis, which is being managed with Fosamax  and vitamin D  supplementation. -We have ordered a bone density test. -We checked your vitamin D  levels.

## 2024-10-25 NOTE — Assessment & Plan Note (Signed)
 Here for CPX -Td 11/2018 - Pneumonia shot 10-05 and 2010;  Prevnar: 2016.  PNM 20: 03/2024 - shingles shot :2014; s/p shingrix  - had a flu shot. - Recommend a COVID-vaccine if not done recently.  Also RSV CCS:  Cscope 2004 normal   Cscope 03/2014 Diverticulosis, no need to repeat due to age and results  -No further PAPs, MMGs   -Diet: Follows a healthy diet -Labs: Reviewed, will check A1c and vitamin D 

## 2024-10-25 NOTE — Progress Notes (Signed)
 "  Subjective:    Patient ID: [name], female    DOB: Aug 25, 1930, 89 y.o.   MRN: [id number]  DOS:  10/25/2024  Here or CPX  Discussed the use of AI scribe software for clinical note transcription with the patient, who gave verbal consent to proceed.  History of Present Illness [name] is a 89 year old female who presents for an annual physical exam.  Gait disorder: - No falls since one episode in the summer - Improved steadiness and ambulation since last fall - Left second finger pain improved after steroid injection  Gastrointestinal and bowel habits - No nausea, vomiting, or blood in stools - Diet high in fruits and adequate hydration - Bowel movements remain regular - Manages constipation with diet alone; not using additional constipation medications  Cardiopulmonary symptoms - No chest pain - No shortness of breath - No lower extremity edema    Review of Systems  Other than above, a 14 point review of systems is negative     Past Medical History:  Diagnosis Date   Abnormal breast exam 2004   MMG neg (eval by surgeon neg)   Anemia    Arthritis    knees (07/11/2014)   Glaucoma    History of blood transfusion    related to a surgery, I think   Hx SBO 2004 , 2007, 2012    exploratory surgery 9-12    Iritis 2005   Osteoarthritis    Osteopenia    SBO (small bowel obstruction) (HCC) 07/2014   Type II diabetes mellitus (HCC)     Past Surgical History:  Procedure Laterality Date   APPENDECTOMY     BACK SURGERY  01-2019   Bilaterally L4, L5 decompression   CATARACT EXTRACTION Bilateral    DILATION AND CURETTAGE OF UTERUS     EXPLORATORY LAPAROTOMY  07/2011   SBO   TOTAL ABDOMINAL HYSTERECTOMY  1970's   for bleeding, no cancer   TOTAL HIP ARTHROPLASTY Right 06/23/2016   Procedure: TOTAL HIP ARTHROPLASTY ANTERIOR APPROACH;  Surgeon: Norleen Gavel, MD;  Location: [facility] OR;  Service: Orthopedics;  Laterality: Right;   TOTAL KNEE ARTHROPLASTY Right  10/2010    Current Outpatient Medications  Medication Instructions   acetaminophen  (TYLENOL ) 650 mg, Every 8 hours PRN   alendronate  (FOSAMAX ) 70 mg, Oral, Weekly, Take with full glass of water on an empty stomach   docusate sodium  (COLACE) 100 mg, Oral, Every 12 hours   dorzolamide-timolol (COSOPT) 2-0.5 % ophthalmic solution 1 drop, 2 times daily   glucose blood (ONE TOUCH ULTRA TEST) test strip Check blood sugar no more than twice daily   latanoprost (XALATAN) 0.005 % ophthalmic solution 1 drop, Daily at bedtime   metFORMIN  (GLUCOPHAGE ) 500 mg, Oral, 2 times daily with meals   Multiple Vitamin (MULTIVITAMIN WITH MINERALS) TABS tablet 1 tablet, Daily   pravastatin  (PRAVACHOL ) 40 mg, Oral, Daily at bedtime       Objective:   Physical Exam BP 128/80   Pulse 74   Temp 98 F (36.7 C) (Oral)   Resp 18   Ht 5' 3 (1.6 m)   Wt 164 lb 4 oz (74.5 kg)   SpO2 96%   BMI 29.10 kg/m  General: Well developed, NAD, BMI noted HEENT:  Normocephalic . Face symmetric, atraumatic Lungs:  CTA B Normal respiratory effort, no intercostal retractions, no accessory muscle use. Heart: RRR,  no murmur.  Lower extremities: no pretibial edema bilaterally  Skin: Exposed areas without rash.  Not pale. Not jaundice Neurologic:  alert & oriented X3.  Speech normal, gait appropriate for age and unassisted Strength symmetric and appropriate for age.  Psych: Cognition and judgment appear intact.  Cooperative with normal attention span and concentration.  Behavior appropriate. No anxious or depressed appearing.     Assessment   Problem list DM  Neuropathy Dx 03/2024 Hyperlipidemia.  Lipitor and simvastatin intolerant Osteopenia-- dexa 05-2015   T score -1.4 Osteoarthritis, chronic back pain -Dr Cesario note from 09/27/2018 : MRI  central canal stenosis, L4-L5,  local injections, no much help   - S/p surgery 01/2019: very good results. Iritis Multiple SBO, exploratory surgery 2012 Tinnitus chronic,  c/o HOH: saw neuro 11/2017, MRI was done, no report, then she saw ENT (Rx conservative treatment)   Assessment & Plan Here for CPX -Td 11/2018 - Pneumonia shot 10-05 and 2010;  Prevnar: 2016.  PNM 20: 03/2024 - shingles shot :2014; s/p shingrix  - had a flu shot. - Recommend a COVID-vaccine if not done recently.  Also RSV CCS:  Cscope 2004 normal   Cscope 03/2014 Diverticulosis, no need to repeat due to age and results  -No further PAPs, MMGs   -Diet: Follows a healthy diet -Labs: Reviewed, will check A1c and vitamin D   Other issues: DM with neuropathy: On metformin  BID, no recent glucose monitoring. Ordered A1c test and encouraged healthy diet. Hyperlipidemia Managed with pravastatin . LDL 101 mg/dL. Intolerant to Lipitor and simvastatin.  No change Age-related osteoporosis T-score -2.06 January 2020.  Has been taking Fosamax  regularly for about a year.  On multivitamin daily. Plan: Check DEXA and vitamin levels. History of DJD: Mild symptoms, currently not taking any pain medication H/o falls: Since the last office visit, no further falls, she feels well , feels steady on her feet RTC 4 to 5 months       "

## 2024-10-26 ENCOUNTER — Ambulatory Visit: Payer: Self-pay | Admitting: Internal Medicine

## 2024-10-31 ENCOUNTER — Telehealth: Payer: Self-pay

## 2024-10-31 NOTE — Progress Notes (Signed)
 Complex Care Management Care Guide Note  10/31/2024 Name: DELORAS REICHARD MRN: [id number] DOB: 10/06/1930  KESSA FAIRBAIRN is a 89 y.o. year old female who is a primary care patient of Amon Aloysius BRAVO, MD and is actively engaged with the care management team. I reached out to Naomie ONEIDA Satchel by phone today to assist with re-scheduling  with the RN Case Manager.  Follow up plan: Telephone appointment with complex care management team member scheduled for:  11/16/24 at 1:00 p.m.   Dreama Lynwood [facility]  [facility], [facility] VBCI Assistant Direct Dial: [phone]  Fax: [phone]

## 2024-11-01 ENCOUNTER — Telehealth

## 2024-11-01 ENCOUNTER — Telehealth: Admitting: Licensed Clinical Social Worker

## 2024-11-02 ENCOUNTER — Other Ambulatory Visit: Payer: Self-pay | Admitting: Internal Medicine

## 2024-11-16 ENCOUNTER — Other Ambulatory Visit: Payer: Self-pay

## 2024-11-16 DIAGNOSIS — Z139 Encounter for screening, unspecified: Secondary | ICD-10-CM

## 2024-11-16 NOTE — Patient Instructions (Signed)
 Visit Information  Thank you for taking time to visit with me today. Please don't hesitate to contact me if I can be of assistance to you before our next scheduled appointment.  Our next appointment is by telephone on 11/30/24 at 1 PM Please call the care guide team at [phone] if you need to cancel or reschedule your appointment.   Following is a copy of your care plan:   Goals Addressed             This Visit's Progress    VBCI RN Care Plan   On track    Problems:  Care Coordination needs related to household cleaning resources Chronic Disease Management support and education needs related to fall risk  Goal: Over the next 30 days the Patient will work with community resource care guide to address needs related to household cleaning resources as evidenced by patient and/or community resource care guide support    Report no falls as evidenced by patient report  Interventions:   Falls Interventions: Provided written and verbal education re: potential causes of falls and Fall prevention strategies Reviewed medications and discussed potential side effects of medications such as dizziness and frequent urination Advised patient of importance of notifying provider of falls Emphasized importance of always using assistive devices   Evaluation of current treatment plan related to household cleaning needs self-management and patient's adherence to plan as established by provider. Discussed plans with patient for ongoing care management follow up and provided patient with direct contact information for care management team Reviewed medications with patient and discussed advised patient to notify PCP that she is hoping to wean off of statin and instead focus on dietary changes to manage cholesterol Care Guide referral for household cleaning resources. Patient reports she would like to find someone who can come once a month to assist with cleaning, but from what she has learned she cannot  afford this service Screening for signs and symptoms of depression related to chronic disease state  Assessed social determinant of health barriers Reviewed chronic conditions. A1c currently within goal of <7% (most recent reading 6.3%)  Patient Self-Care Activities:  Attend all scheduled provider appointments Attend church or other social activities Call provider office for new concerns or questions  Perform all self care activities independently  Perform IADL's (shopping, preparing meals, housekeeping, managing finances) independently Take medications as prescribed    Plan:  Telephone follow up appointment with care management team member scheduled for:  11/30/24 at 1 PM             Please call the Suicide and Crisis Lifeline: 988 call 1-800-273-TALK (toll free, 24 hour hotline) if you are experiencing a Mental Health or Behavioral Health Crisis or need someone to talk to.  Patient verbalized understanding of Care plan and visit instructions communicated this visit  Rosaline Finlay, RN MSN [place]  [facility] RN Care Manager Direct Dial: [phone]  Fax: [phone]

## 2024-11-16 NOTE — Patient Outreach (Signed)
 Complex Care Management   Visit Note  11/16/2024  Name:  [name] MRN: [id number] DOB: 10-10-1930  Situation: Referral received for Complex Care Management related to fall risk and household cleaning resources I obtained verbal consent from Patient.  Visit completed with Patient  on the phone  Background:   Past Medical History:  Diagnosis Date   Abnormal breast exam 2004   MMG neg (eval by surgeon neg)   Anemia    Arthritis    knees (07/11/2014)   Glaucoma    History of blood transfusion    related to a surgery, I think   Hx SBO 2004 , 2007, 2012    exploratory surgery 9-12    Iritis 2005   Osteoarthritis    Osteopenia    SBO (small bowel obstruction) (HCC) 07/2014   Type II diabetes mellitus (HCC)     Assessment: Patient Reported Symptoms:  Cognitive Cognitive Status: Able to follow simple commands, Alert and oriented to person, place, and time, Normal speech and language skills Cognitive/Intellectual Conditions Management [RPT]: None reported or documented in medical history or problem list   Health Maintenance Behaviors: Annual physical exam, Healthy diet, Immunizations, Stress management, Spiritual practice(s), Hobbies Health Facilitated by: Healthy diet, Prayer/meditation, Rest, Stress management  Neurological Neurological Review of Symptoms: No symptoms reported Neurological Management Strategies: Routine screening Neurological Comment: Patient reports previous issues related to neuropathy/numbness which has improved with dietary adjustments - sticking to a diabetic diet  HEENT HEENT Symptoms Reported: Other: HEENT Management Strategies: Coping strategies, Routine screening    Cardiovascular Cardiovascular Symptoms Reported: No symptoms reported Does patient have uncontrolled Hypertension?: No Cardiovascular Management Strategies: Coping strategies, Diet modification, Medication therapy, Routine screening Cardiovascular Comment: Patient reports she is  trying to wean herself from statin and is working on dietary changes. She reports she has not mentioned this to PCP, but will at her next visit.  Respiratory Respiratory Symptoms Reported: No symptoms reported Respiratory Management Strategies: Routine screening  Endocrine Endocrine Symptoms Reported: No symptoms reported Is patient diabetic?: Yes Is patient checking blood sugars at home?: No    Gastrointestinal Gastrointestinal Symptoms Reported: No symptoms reported Additional Gastrointestinal Details: Patient reports her appetite is coming back. She reports she had a spell where she didn't want to eat about a year ago, and had some associated weight loss. She reports she has adjusted her diet and her weight has since been stable. Patient reports regular BMs, last BM this morning Gastrointestinal Management Strategies: Coping strategies, Diet modification    Genitourinary Genitourinary Symptoms Reported: No symptoms reported    Integumentary Integumentary Symptoms Reported: No symptoms reported Skin Management Strategies: Routine screening  Musculoskeletal Musculoskelatal Symptoms Reviewed: Unsteady gait, Back pain Additional Musculoskeletal Details: Patient reports pain related to arthritis in her lower back when the weather changes. She reports she typically takes Tylenol  or salonpas patches as needed for pain relief. Musculoskeletal Management Strategies: Medical device, Adequate rest, Coping strategies Musculoskeletal Comment: Patient reports 2 falls in the last year that occurred while she was trying to clean her home. She denies injuries from falls. Patient reports she has previously worked with PT following previous surgeries. Patient reports she does regular exercise by walking (weather permitting) Falls in the past year?: Yes Number of falls in past year: 2 or more Was there an injury with Fall?: No Fall Risk Category Calculator: 2 Patient Fall Risk Level: Moderate Fall  Risk Patient at Risk for Falls Due to: History of fall(s), Impaired balance/gait Fall risk Follow up:  Falls evaluation completed, Education provided, Falls prevention discussed  Psychosocial Psychosocial Symptoms Reported: No symptoms reported     Quality of Family Relationships: helpful, involved, supportive Do you feel physically threatened by others?: No    11/16/2024    PHQ2-9 Depression Screening   Little interest or pleasure in doing things Not at all  Feeling down, depressed, or hopeless Not at all  PHQ-2 - Total Score 0  Trouble falling or staying asleep, or sleeping too much    Feeling tired or having little energy    Poor appetite or overeating     Feeling bad about yourself - or that you are a failure or have let yourself or your family down    Trouble concentrating on things, such as reading the newspaper or watching television    Moving or speaking so slowly that other people could have noticed.  Or the opposite - being so fidgety or restless that you have been moving around a lot more than usual    Thoughts that you would be better off dead, or hurting yourself in some way    PHQ2-9 Total Score    If you checked off any problems, how difficult have these problems made it for you to do your work, take care of things at home, or get along with other people    Depression Interventions/Treatment      There were no vitals filed for this visit. Pain Scale: 0-10 Pain Score: 8  Pain Type: Chronic pain Pain Location: Back Pain Orientation: Lower  Medications Reviewed Today     Reviewed by Arno Rosaline SQUIBB, RN (Registered Nurse) on 11/16/24 at 1330  Med List Status: <None>   Medication Order Taking? Sig Documenting Provider Last Dose Status Informant  acetaminophen  (TYLENOL ) 650 MG CR tablet [id number] Yes Take 650 mg by mouth every 8 (eight) hours as needed for pain.  [provider]  Active Self           Med Note CLAUD GIBES T   Tue Dec 21, 2018  2:34  PM)    alendronate  (FOSAMAX ) 70 MG tablet [id number] Yes Take 1 tablet (70 mg total) by mouth once a week. Take with full glass of water on an empty stomach Amon Aloysius BRAVO, MD  Active   docusate sodium  (COLACE) 100 MG capsule [id number]  Take 1 capsule (100 mg total) by mouth every 12 (twelve) hours.  Patient not taking: Reported on 11/16/2024   Freddi Hamilton, MD  Active   dorzolamide-timolol (COSOPT) 2-0.5 % ophthalmic solution [id number] Yes Place 1 drop into both eyes 2 (two) times daily. [provider]  Active   glucose blood (ONE TOUCH ULTRA TEST) test strip [id number]  Check blood sugar no more than twice daily  Patient not taking: Reported on 11/16/2024   Paz, Jose E, MD  Active   latanoprost (XALATAN) 0.005 % ophthalmic solution [id number] Yes Place 1 drop into both eyes at bedtime. [provider]  Active            Med Note (CANTER, KAYLYN D   Wed Dec 02, 2023 11:05 AM) Pt is sure its 0.025%, unable to find in system  metFORMIN  (GLUCOPHAGE ) 500 MG tablet [id number] Yes TAKE 1 TABLET BY MOUTH 2 TIMES DAILY WITH A MEAL. Paz, Jose E, MD  Active   Multiple Vitamin (MULTIVITAMIN WITH MINERALS) TABS tablet [id number] Yes Take 1 tablet by mouth daily. [provider]  Active Self  pravastatin  (PRAVACHOL ) 40 MG tablet [id number] Yes  Take 1 tablet (40 mg total) by mouth at bedtime. Amon Aloysius BRAVO, MD  Active             Recommendation:   Referral to: community care guide for household cleaning resources Continue Current Plan of Care  Follow Up Plan:   Referral to Care Guide  Rosaline Finlay, RN MSN [place]  [facility] RN Care Manager Direct Dial: [phone]  Fax: [phone]

## 2024-11-18 ENCOUNTER — Telehealth: Payer: Self-pay

## 2024-11-18 NOTE — Progress Notes (Signed)
 Complex Care Management Note Care Guide Note  11/18/2024 Name: [name] MRN: [id number] DOB: [birth year]   Complex Care Management Outreach Attempts: An unsuccessful telephone outreach was attempted today to offer the patient information about available complex care management services.  Follow Up Plan:  Additional outreach attempts will be made to offer the patient complex care management information and services.   Encounter Outcome:  No Answer  [name] [name]  [facility] Guide Direct Dial: [phone]  Fax: [phone] Website: [email or web address]

## 2024-11-22 ENCOUNTER — Telehealth: Payer: Self-pay

## 2024-11-22 NOTE — Progress Notes (Signed)
 Complex Care Management Note Care Guide Note  11/22/2024 Name: [name] MRN: [id number] DOB: [birth year]  [name] is a 89 y.o. year old female who is a primary care patient of Amon, Aloysius BRAVO, MD . The community resource team was consulted for assistance with list of house cleaning services.  SDOH screenings and interventions completed:  Yes  Social Drivers of Health From This Encounter   Food Insecurity: No Food Insecurity (11/22/2024)   Epic    Worried About Programme Researcher, Broadcasting/film/video in the Last Year: Never true    Ran Out of Food in the Last Year: Never true  Housing: Low Risk (11/22/2024)   Epic    Unable to Pay for Housing in the Last Year: No    Number of Times Moved in the Last Year: 0    Homeless in the Last Year: No  Financial Resource Strain: Medium Risk (11/22/2024)   Overall Financial Resource Strain (CARDIA)    Difficulty of Paying Living Expenses: Somewhat hard  Transportation Needs: No Transportation Needs (11/22/2024)   Epic    Lack of Transportation (Medical): No    Lack of Transportation (Non-Medical): No  Utilities: Not At Risk (11/22/2024)   Epic    Threatened with loss of utilities: No       Care guide performed the following interventions: Patient provided with information about care guide support team and interviewed to confirm resource needs Patient decided she is no longer interested in a house cleaning service at this time.  Follow Up Plan:  No further follow up planned at this time. The patient has been provided with needed resources. and client has my name and number if she changes her mind or needs something else.  Encounter Outcome:  Patient Visit Completed  [name] [name]  [facility] Guide Direct Dial: [phone]  Fax: [phone] Website: [email or web address]

## 2024-11-30 ENCOUNTER — Other Ambulatory Visit: Payer: Self-pay

## 2024-11-30 NOTE — Patient Outreach (Signed)
 Complex Care Management   Visit Note  11/30/2024  Name:  [name] MRN: [id number] DOB: June 13, 1930  Situation: Referral received for Complex Care Management related to Diabetes with Complications and fall risk I obtained verbal consent from Patient.  Visit completed with Patient  on the phone  Background:   Past Medical History:  Diagnosis Date   Abnormal breast exam 2004   MMG neg (eval by surgeon neg)   Anemia    Arthritis    knees (07/11/2014)   Glaucoma    History of blood transfusion    related to a surgery, I think   Hx SBO 2004 , 2007, 2012    exploratory surgery 9-12    Iritis 2005   Osteoarthritis    Osteopenia    SBO (small bowel obstruction) (HCC) 07/2014   Type II diabetes mellitus (HCC)     Assessment: Patient Reported Symptoms:  Cognitive Cognitive Status: Able to follow simple commands, Alert and oriented to person, place, and time, Normal speech and language skills Cognitive/Intellectual Conditions Management [RPT]: None reported or documented in medical history or problem list   Health Maintenance Behaviors: Annual physical exam, Healthy diet, Immunizations, Spiritual practice(s), Stress management Health Facilitated by: Healthy diet, Prayer/meditation, Rest, Stress management  Neurological Neurological Review of Symptoms: No symptoms reported Neurological Management Strategies: Routine screening  HEENT HEENT Symptoms Reported: No symptoms reported HEENT Management Strategies: Coping strategies, Routine screening HEENT Comment: Patient reports issue with broken tooth is resolved    Cardiovascular Cardiovascular Symptoms Reported: No symptoms reported Does patient have uncontrolled Hypertension?: No Cardiovascular Management Strategies: Coping strategies, Diet modification, Medication therapy, Routine screening  Respiratory Respiratory Symptoms Reported: No symptoms reported Respiratory Management Strategies: Routine screening  Endocrine  Endocrine Symptoms Reported: No symptoms reported Is patient diabetic?: Yes Is patient checking blood sugars at home?: No    Gastrointestinal Gastrointestinal Symptoms Reported: No symptoms reported Gastrointestinal Management Strategies: Coping strategies, Diet modification    Genitourinary Genitourinary Symptoms Reported: No symptoms reported    Integumentary Integumentary Symptoms Reported: No symptoms reported Skin Management Strategies: Routine screening  Musculoskeletal Musculoskelatal Symptoms Reviewed: Unsteady gait, Back pain Musculoskeletal Management Strategies: Adequate rest, Coping strategies, Medical device Musculoskeletal Comment: Patient denies falls since previous CMRN visit Falls in the past year?: Yes Number of falls in past year: 2 or more Was there an injury with Fall?: No Fall Risk Category Calculator: 2 Patient Fall Risk Level: Moderate Fall Risk Patient at Risk for Falls Due to: History of fall(s), Impaired balance/gait Fall risk Follow up: Falls evaluation completed, Education provided, Falls prevention discussed  Psychosocial Psychosocial Symptoms Reported: No symptoms reported          11/30/2024    PHQ2-9 Depression Screening   Little interest or pleasure in doing things    Feeling down, depressed, or hopeless    PHQ-2 - Total Score    Trouble falling or staying asleep, or sleeping too much    Feeling tired or having little energy    Poor appetite or overeating     Feeling bad about yourself - or that you are a failure or have let yourself or your family down    Trouble concentrating on things, such as reading the newspaper or watching television    Moving or speaking so slowly that other people could have noticed.  Or the opposite - being so fidgety or restless that you have been moving around a lot more than usual    Thoughts that you would be better  off dead, or hurting yourself in some way    PHQ2-9 Total Score    If you checked off any problems,  how difficult have these problems made it for you to do your work, take care of things at home, or get along with other people    Depression Interventions/Treatment      There were no vitals filed for this visit. Pain Scale: 0-10 Pain Score: 0-No pain  Medications Reviewed Today     Reviewed by Arno Rosaline SQUIBB, RN (Registered Nurse) on 11/30/24 at 1310  Med List Status: <None>   Medication Order Taking? Sig Documenting Provider Last Dose Status Informant  acetaminophen  (TYLENOL ) 650 MG CR tablet [id number]  Take 650 mg by mouth every 8 (eight) hours as needed for pain.  [provider]  Active Self           Med Note CLAUD GIBES T   Tue Dec 21, 2018  2:34 PM)    alendronate  (FOSAMAX ) 70 MG tablet [id number]  Take 1 tablet (70 mg total) by mouth once a week. Take with full glass of water on an empty stomach Amon Aloysius BRAVO, MD  Active   docusate sodium  (COLACE) 100 MG capsule [id number]  Take 1 capsule (100 mg total) by mouth every 12 (twelve) hours.  Patient not taking: Reported on 11/16/2024   Freddi Hamilton, MD  Active   dorzolamide-timolol (COSOPT) 2-0.5 % ophthalmic solution [id number]  Place 1 drop into both eyes 2 (two) times daily. [provider]  Active   glucose blood (ONE TOUCH ULTRA TEST) test strip [id number]  Check blood sugar no more than twice daily  Patient not taking: Reported on 11/16/2024   Paz, Jose E, MD  Active   latanoprost (XALATAN) 0.005 % ophthalmic solution [id number]  Place 1 drop into both eyes at bedtime. [provider]  Active            Med Note (CANTER, KAYLYN D   Wed Dec 02, 2023 11:05 AM) Pt is sure its 0.025%, unable to find in system  metFORMIN  (GLUCOPHAGE ) 500 MG tablet [id number]  TAKE 1 TABLET BY MOUTH 2 TIMES DAILY WITH A MEAL. Paz, Jose E, MD  Active   Multiple Vitamin (MULTIVITAMIN WITH MINERALS) TABS tablet [id number]  Take 1 tablet by mouth daily. [provider]  Active Self  pravastatin  (PRAVACHOL ) 40 MG  tablet [id number]  Take 1 tablet (40 mg total) by mouth at bedtime.  Patient taking differently: Take 40 mg by mouth at bedtime. Every other day   Amon Aloysius BRAVO, MD  Active             Recommendation:   Continue Current Plan of Care  Follow Up Plan:   Closing From:  Complex Care Management Patient has met all care management goals. Care Management case will be closed. Patient has been provided contact information should new needs arise.   Rosaline Arno, RN MSN [place]  [facility] RN Care Manager Direct Dial: [phone]  Fax: [phone]

## 2024-11-30 NOTE — Patient Instructions (Signed)
 Visit Information  Thank you for taking time to visit with me today. Please don't hesitate to contact me if I can be of assistance to you before our next scheduled appointment.  Your next care management appointment is no further scheduled appointments.    Closing From: Complex Care Management. Patient has met all care management goals. Care Management case will be closed. Patient has been provided contact information should new needs arise.   Please call the care guide team at [phone] if you need to cancel, schedule, or reschedule an appointment.   Please call the Suicide and Crisis Lifeline: 988 call 1-800-273-TALK (toll free, 24 hour hotline) if you are experiencing a Mental Health or Behavioral Health Crisis or need someone to talk to.  Rosaline Finlay, RN MSN [place]  [facility] Population Health RN Care Manager Direct Dial: [phone]  Fax: [phone]   Following is a copy of your care plan:   Goals Addressed             This Visit's Progress    COMPLETED: VBCI RN Care Plan   On track    Problems:  Care Coordination needs related to household cleaning resources Chronic Disease Management support and education needs related to fall risk  Goal: Over the next 30 days the Patient will work with community resource care guide to address needs related to household cleaning resources as evidenced by patient and/or community resource care guide support    Report no falls as evidenced by patient report  Interventions:   Falls Interventions: Provided written and verbal education re: potential causes of falls and Fall prevention strategies Reviewed medications and discussed potential side effects of medications such as dizziness and frequent urination Advised patient of importance of notifying provider of falls Assessed for falls since last encounter Assessed patients knowledge of fall risk prevention secondary to previously provided education Emphasized importance  of always using assistive devices   Evaluation of current treatment plan related to household cleaning needs self-management and patient's adherence to plan as established by provider. Discussed plans with patient for ongoing care management follow up and provided patient with direct contact information for care management team Screening for signs and symptoms of depression related to chronic disease state  Ensured patient is no longer interested in a house cleaning service. Ensured she has care team contact information if she changes her mind  Patient Self-Care Activities:  Attend all scheduled provider appointments Attend church or other social activities Call provider office for new concerns or questions  Perform all self care activities independently  Perform IADL's (shopping, preparing meals, housekeeping, managing finances) independently Take medications as prescribed    Plan:  No further follow up required: Patient has met care plan goals             Patient verbalized understanding of Care plan and visit instructions communicated this visit

## 2024-12-15 ENCOUNTER — Other Ambulatory Visit (HOSPITAL_BASED_OUTPATIENT_CLINIC_OR_DEPARTMENT_OTHER)

## 2025-02-23 ENCOUNTER — Ambulatory Visit
